# Patient Record
Sex: Female | Born: 1945 | Race: White | Hispanic: No | Marital: Married | Smoking: Former smoker
Health system: Southern US, Community
[De-identification: ages and names within clinical notes are randomized; demographics above are authoritative.]

## PROBLEM LIST (undated history)

## (undated) DIAGNOSIS — E119 Type 2 diabetes mellitus without complications: Secondary | ICD-10-CM

## (undated) DIAGNOSIS — I10 Essential (primary) hypertension: Secondary | ICD-10-CM

## (undated) DIAGNOSIS — R519 Headache, unspecified: Secondary | ICD-10-CM

## (undated) DIAGNOSIS — I2510 Atherosclerotic heart disease of native coronary artery without angina pectoris: Secondary | ICD-10-CM

## (undated) DIAGNOSIS — I219 Acute myocardial infarction, unspecified: Secondary | ICD-10-CM

## (undated) DIAGNOSIS — R51 Headache: Secondary | ICD-10-CM

## (undated) HISTORY — PX: CATARACT EXTRACTION BILATERAL W/ ANTERIOR VITRECTOMY: SHX1304

## (undated) HISTORY — PX: BACK SURGERY: SHX140

## (undated) HISTORY — PX: MELANOMA EXCISION: SHX5266

## (undated) HISTORY — PX: ABDOMINAL HYSTERECTOMY: SHX81

## (undated) HISTORY — PX: EYE SURGERY: SHX253

## (undated) HISTORY — PX: TONSILLECTOMY: SUR1361

---

## 2011-02-11 DIAGNOSIS — F329 Major depressive disorder, single episode, unspecified: Secondary | ICD-10-CM | POA: Insufficient documentation

## 2011-02-11 DIAGNOSIS — E109 Type 1 diabetes mellitus without complications: Secondary | ICD-10-CM | POA: Insufficient documentation

## 2012-05-24 DIAGNOSIS — Z9289 Personal history of other medical treatment: Secondary | ICD-10-CM | POA: Insufficient documentation

## 2012-11-16 DIAGNOSIS — I2510 Atherosclerotic heart disease of native coronary artery without angina pectoris: Secondary | ICD-10-CM | POA: Insufficient documentation

## 2013-02-13 ENCOUNTER — Emergency Department: Payer: Self-pay | Admitting: Emergency Medicine

## 2013-02-13 LAB — COMPREHENSIVE METABOLIC PANEL
Albumin: 3.9 g/dL (ref 3.4–5.0)
Alkaline Phosphatase: 121 U/L (ref 50–136)
Anion Gap: 6 — ABNORMAL LOW (ref 7–16)
BUN: 10 mg/dL (ref 7–18)
Bilirubin,Total: 0.6 mg/dL (ref 0.2–1.0)
Chloride: 101 mmol/L (ref 98–107)
Co2: 29 mmol/L (ref 21–32)
Creatinine: 0.79 mg/dL (ref 0.60–1.30)
EGFR (Non-African Amer.): >60
Glucose: 282 mg/dL — ABNORMAL HIGH (ref 65–99)
Osmolality: 281 (ref 275–301)
SGOT(AST): 19 U/L (ref 15–37)
SGPT (ALT): 25 U/L (ref 12–78)
Total Protein: 7.4 g/dL (ref 6.4–8.2)

## 2013-02-13 LAB — CBC WITH DIFFERENTIAL/PLATELET
Eosinophil #: 0.3 10*3/uL (ref 0.0–0.7)
Eosinophil %: 3 %
HCT: 38.6 % (ref 35.0–47.0)
HGB: 13.4 g/dL (ref 12.0–16.0)
MCH: 33.8 pg (ref 26.0–34.0)
MCV: 98 fL (ref 80–100)
Monocyte #: 0.3 x10 3/mm (ref 0.2–0.9)
Monocyte %: 3.3 %
Neutrophil %: 85 %
Platelet: 237 10*3/uL (ref 150–440)
RDW: 13.6 % (ref 11.5–14.5)
WBC: 9.6 10*3/uL (ref 3.6–11.0)

## 2013-02-13 LAB — LIPASE, BLOOD: Lipase: 98 U/L (ref 73–393)

## 2013-02-13 LAB — TROPONIN I: Troponin-I: <0.02 ng/mL

## 2013-03-09 ENCOUNTER — Encounter: Payer: Self-pay | Admitting: Internal Medicine

## 2013-04-06 ENCOUNTER — Encounter: Payer: Self-pay | Admitting: Internal Medicine

## 2013-11-01 DIAGNOSIS — I219 Acute myocardial infarction, unspecified: Secondary | ICD-10-CM

## 2013-11-01 HISTORY — DX: Acute myocardial infarction, unspecified: I21.9

## 2013-11-01 HISTORY — PX: CORONARY ANGIOPLASTY WITH STENT PLACEMENT: SHX49

## 2014-07-01 ENCOUNTER — Emergency Department: Payer: Self-pay | Admitting: Emergency Medicine

## 2014-07-01 LAB — COMPREHENSIVE METABOLIC PANEL
ALK PHOS: 95 U/L
ALT: 39 U/L
ANION GAP: 16 (ref 7–16)
Albumin: 4.6 g/dL
BILIRUBIN TOTAL: 1.5 mg/dL — AB
BUN: 23 mg/dL — AB
CO2: 20 mmol/L — AB
CREATININE: 1.1 mg/dL — AB
Calcium, Total: 9 mg/dL
Chloride: 97 mmol/L — ABNORMAL LOW
EGFR (Non-African Amer.): 51 — ABNORMAL LOW
GFR CALC AF AMER: 59 — AB
Glucose: 178 mg/dL — ABNORMAL HIGH
POTASSIUM: 4.3 mmol/L
SGOT(AST): 49 U/L — ABNORMAL HIGH
Sodium: 133 mmol/L — ABNORMAL LOW
Total Protein: 7.5 g/dL

## 2014-07-01 LAB — CBC WITH DIFFERENTIAL/PLATELET
Basophil #: 0 10*3/uL (ref 0.0–0.1)
Basophil %: 0.4 %
Eosinophil #: 0 10*3/uL (ref 0.0–0.7)
Eosinophil %: 0.1 %
HCT: 36.7 % (ref 35.0–47.0)
HGB: 12.1 g/dL (ref 12.0–16.0)
LYMPHS ABS: 0.6 10*3/uL — AB (ref 1.0–3.6)
Lymphocyte %: 6.3 %
MCH: 32.8 pg (ref 26.0–34.0)
MCHC: 33 g/dL (ref 32.0–36.0)
MCV: 99 fL (ref 80–100)
MONO ABS: 0.3 x10 3/mm (ref 0.2–0.9)
MONOS PCT: 3.7 %
NEUTROS ABS: 8.2 10*3/uL — AB (ref 1.4–6.5)
Neutrophil %: 89.5 %
PLATELETS: 239 10*3/uL (ref 150–440)
RBC: 3.69 10*6/uL — ABNORMAL LOW (ref 3.80–5.20)
RDW: 13.3 % (ref 11.5–14.5)
WBC: 9.2 10*3/uL (ref 3.6–11.0)

## 2014-07-01 LAB — LIPASE, BLOOD: Lipase: 15 U/L — ABNORMAL LOW

## 2014-07-01 LAB — TROPONIN I: Troponin-I: 0.03 ng/mL

## 2015-02-07 ENCOUNTER — Emergency Department: Payer: Medicare Other

## 2015-02-07 ENCOUNTER — Encounter: Payer: Self-pay | Admitting: Emergency Medicine

## 2015-02-07 ENCOUNTER — Emergency Department
Admission: EM | Admit: 2015-02-07 | Discharge: 2015-02-07 | Disposition: A | Discharge status: Home / Self Care | Payer: Medicare Other | Attending: Student | Admitting: Student

## 2015-02-07 ENCOUNTER — Ambulatory Visit
Admission: EM | Admit: 2015-02-07 | Discharge: 2015-02-07 | Discharge status: Absent without leave | Payer: Medicare Other | Source: Home / Self Care

## 2015-02-07 DIAGNOSIS — S0081XA Abrasion of other part of head, initial encounter: Secondary | ICD-10-CM | POA: Insufficient documentation

## 2015-02-07 DIAGNOSIS — E119 Type 2 diabetes mellitus without complications: Secondary | ICD-10-CM | POA: Diagnosis not present

## 2015-02-07 DIAGNOSIS — S0993XA Unspecified injury of face, initial encounter: Secondary | ICD-10-CM | POA: Diagnosis present

## 2015-02-07 DIAGNOSIS — S0083XA Contusion of other part of head, initial encounter: Secondary | ICD-10-CM | POA: Diagnosis not present

## 2015-02-07 DIAGNOSIS — S0031XA Abrasion of nose, initial encounter: Secondary | ICD-10-CM | POA: Insufficient documentation

## 2015-02-07 DIAGNOSIS — W01198A Fall on same level from slipping, tripping and stumbling with subsequent striking against other object, initial encounter: Secondary | ICD-10-CM | POA: Insufficient documentation

## 2015-02-07 DIAGNOSIS — Y9289 Other specified places as the place of occurrence of the external cause: Secondary | ICD-10-CM | POA: Diagnosis not present

## 2015-02-07 DIAGNOSIS — I10 Essential (primary) hypertension: Secondary | ICD-10-CM | POA: Diagnosis not present

## 2015-02-07 DIAGNOSIS — Y998 Other external cause status: Secondary | ICD-10-CM | POA: Diagnosis not present

## 2015-02-07 DIAGNOSIS — Y9301 Activity, walking, marching and hiking: Secondary | ICD-10-CM | POA: Insufficient documentation

## 2015-02-07 HISTORY — DX: Type 2 diabetes mellitus without complications: E11.9

## 2015-02-07 HISTORY — DX: Atherosclerotic heart disease of native coronary artery without angina pectoris: I25.10

## 2015-02-07 HISTORY — DX: Essential (primary) hypertension: I10

## 2015-02-07 MED ORDER — TRAMADOL HCL 50 MG PO TABS
50.0000 mg | ORAL_TABLET | Freq: Once | ORAL | Status: AC
  Administered 2015-02-07: 50 mg via ORAL
  Filled 2015-02-07: qty 1

## 2015-02-07 MED ORDER — TRAMADOL HCL 50 MG PO TABS: 50.0000 mg | ORAL_TABLET | Freq: Four times a day (QID) | ORAL | Status: DC | PRN

## 2015-02-07 MED ORDER — IBUPROFEN 800 MG PO TABS: 800.0000 mg | ORAL_TABLET | Freq: Three times a day (TID) | ORAL | Status: DC | PRN

## 2015-02-07 MED ORDER — IBUPROFEN 800 MG PO TABS
800.0000 mg | ORAL_TABLET | Freq: Once | ORAL | Status: AC
  Administered 2015-02-07: 800 mg via ORAL
  Filled 2015-02-07: qty 1

## 2015-02-07 NOTE — ED Notes (Signed)
Walking dog, shoe got stuck and patient tripped and fell onto concrete hitting left face. Denies LOC. Eccymosis to left eye and swelling to left forehead noted.

## 2015-02-07 NOTE — ED Provider Notes (Signed)
[facility] Emergency Department Provider Note  ____________________________________________  Time seen: Approximately 7:00 PM  I have reviewed the triage vital signs and the nursing notes.   HISTORY  Chief Complaint Fall    HPI Diana Frost is a 69 y.o. female patient tripped and fell while walking her dog. Patient said her face hit the concrete. Patient denies any loss of consciousness. Patient states she had her glasses on when she fell which causes a small abrasion or laceration to the nasal area. Patient is having mild pain edema and ecchymosis to the left supra orbital area. Patient also has some mild edema and abrasion to the nasal area. Patient rating the pain as a 0. Patient denies any vision disturbance.No palliative measures except for plan ice to the left supraorbital area.   Past Medical History  Diagnosis Date  . Coronary artery disease   . Hypertension   . Diabetes mellitus without complication ([place])     There are no active problems to display for this patient.   Past Surgical History  Procedure Laterality Date  . Cardiac stents    . Eye surgery      bilateral cataracts  . Abdominal hysterectomy      Current Outpatient Rx  Name  Route  Sig  Dispense  Refill  . ibuprofen (ADVIL,MOTRIN) 800 MG tablet   Oral   Take 1 tablet (800 mg total) by mouth every 8 (eight) hours as needed for moderate pain.   15 tablet   0   . traMADol (ULTRAM) 50 MG tablet   Oral   Take 1 tablet (50 mg total) by mouth every 6 (six) hours as needed for moderate pain.   12 tablet   0     Allergies Review of patient's allergies indicates no known allergies.  No family history on file.  Social History Social History  Substance Use Topics  . Smoking status: Never Smoker   . Smokeless tobacco: Never Used  . Alcohol Use: No    Review of Systems Constitutional: No fever/chills Eyes: No visual changes. ENT: No sore throat. Cardiovascular:  Denies chest pain. Respiratory: Denies shortness of breath. Gastrointestinal: No abdominal pain.  No nausea, no vomiting.  No diarrhea.  No constipation. Genitourinary: Negative for dysuria. Musculoskeletal: Negative for back pain. Skin: Negative for rash. Swelling and bruising to the left supraorbital area and nasal area.. Ecchymosis and abrasion to the facial area. Neurological: Negative for headaches, focal weakness or numbness. Endocrine:Diabetes and hypertension. 10-point ROS otherwise negative.  ____________________________________________   PHYSICAL EXAM:  VITAL SIGNS: ED Triage Vitals  Enc Vitals Group     BP 02/07/15 1827 131/77 mmHg     Pulse Rate 02/07/15 1824 101     Resp 02/07/15 1824 14     Temp 02/07/15 1824 98.8 F (37.1 C)     Temp Source 02/07/15 1824 Oral     SpO2 02/07/15 1827 99 %     Weight 02/07/15 1827 140 lb (63.504 kg)     Height 02/07/15 1827 5\' 2"  (1.575 m)     Head Cir --      Peak Flow --      Pain Score 02/07/15 1828 0     Pain Loc --      Pain Edu? --      Excl. in [place]? --    Constitutional: Alert and oriented. Well appearing and in no acute distress. Eyes: Conjunctivae are normal. PERRL. EOMI. ecchymosis and edema supra orbital area.  Head: Atraumatic. Nose: No congestion/rhinnorhea. Nasal edema Mouth/Throat: Mucous membranes are moist.  Oropharynx non-erythematous. Neck: No stridor.  No cervical spine tenderness to palpation. Hematological/Lymphatic/Immunilogical: No cervical lymphadenopathy. Cardiovascular: Normal rate, regular rhythm. Grossly normal heart sounds.  Good peripheral circulation. Respiratory: Normal respiratory effort.  No retractions. Lungs CTAB. Gastrointestinal: Soft and nontender. No distention. No abdominal bruits. No CVA tenderness. Musculoskeletal: No lower extremity tenderness nor edema.  No joint effusions. Neurologic:  Normal speech and language. No gross focal neurologic deficits are appreciated. No gait  instability. Skin:  Skin is warm, dry and intact. No rash noted. Ecchymosis and abrasion to the facial area. Psychiatric: Mood and affect are normal. Speech and behavior are normal.  ____________________________________________   LABS (all labs ordered are listed, but only abnormal results are displayed)  Labs Reviewed - No data to display ____________________________________________  EKG   ____________________________________________  RADIOLOGY  No acute finding outpatient x-ray. I, Sable Feil, personally viewed and evaluated these images (plain radiographs) as part of my medical decision making.   ____________________________________________   PROCEDURES  Procedure(s) performed: None  Critical Care performed: No  ____________________________________________   INITIAL IMPRESSION / ASSESSMENT AND PLAN / ED COURSE  Pertinent labs & imaging results that were available during my care of the patient were reviewed by me and considered in my medical decision making (see chart for details).  Facial contusion. Skull is x-ray findings with palpation. Patient given structures for home supportive care. Patient a prescription for tramadol and ibuprofen to take as needed. Advised to follow-up family doctor if condition persists. Return back to the ER for condition worsens. ____________________________________________   FINAL CLINICAL IMPRESSION(S) / ED DIAGNOSES  Final diagnoses:  Facial contusion, initial encounter      Sable Feil, PA-C 02/07/15 1954  Joanne Gavel, MD 02/08/15 0001

## 2015-02-07 NOTE — ED Notes (Signed)
Pt transported to and from xray via stretcher.

## 2015-02-07 NOTE — Discharge Instructions (Signed)

## 2015-02-07 NOTE — ED Notes (Signed)
Pt states she was walking her dog and got tripped up on her leash and fell on the concrete pt states she had glasses on when she hits. Denies loc, n/v.

## 2015-03-09 ENCOUNTER — Ambulatory Visit
Admission: EM | Admit: 2015-03-09 | Discharge: 2015-03-09 | Disposition: A | Discharge status: Home / Self Care | Payer: Medicare Other | Attending: Family Medicine | Admitting: Family Medicine

## 2015-03-09 DIAGNOSIS — H66002 Acute suppurative otitis media without spontaneous rupture of ear drum, left ear: Secondary | ICD-10-CM | POA: Diagnosis not present

## 2015-03-09 MED ORDER — CEFDINIR 300 MG PO CAPS: 300.0000 mg | ORAL_CAPSULE | Freq: Two times a day (BID) | ORAL | Status: DC

## 2015-03-09 NOTE — Discharge Instructions (Signed)

## 2015-03-09 NOTE — ED Provider Notes (Signed)
CSN: [id number]     Arrival date & time 03/09/15  1021 History   First MD Initiated Contact with Patient 03/09/15 1101     Chief Complaint  Patient presents with  . Otalgia   HPI    Diana Frost is a pleasant 69 y.o. female here for left ear pain that started 10 days ago. She has left lateral neck pain as well that started at the same time.  She describes the pain as dull and stabbing. Pain 6/10 at worst.  She also has rhinorrhea that is yellow.  She denies any ear discharge. She does have some maxillary sinus tenderness and pain. She states when I blow my nose it rattles in her left ear. She has tried ibuprofen 600 mg about 6 doses since pain started and it seems to help a little but pain returns once it wears off.  Pain is exacerbated by cold & movement.  Past Medical History  Diagnosis Date  . Coronary artery disease   . Hypertension   . Diabetes mellitus without complication [facility])    Past Surgical History  Procedure Laterality Date  . Coronary angioplasty with stent placement    . Cataract extraction bilateral w/ anterior vitrectomy    . Abdominal hysterectomy     History reviewed. No pertinent family history. Social History  Substance Use Topics  . Smoking status: Never Smoker   . Smokeless tobacco: Never Used  . Alcohol Use: 0.0 oz/week    0 Standard drinks or equivalent per week     Comment: occasional   OB History    No data available     Review of Systems  Constitutional: Negative.   HENT: Positive for congestion, ear pain, postnasal drip, rhinorrhea and sinus pressure. Negative for facial swelling, sneezing, sore throat and tinnitus.   Eyes: Negative.   Respiratory: Negative.   Cardiovascular: Negative.   Gastrointestinal: Negative.   Endocrine: Negative.   Genitourinary: Negative.   Skin: Negative.   Neurological: Negative.   Hematological: Negative.   Psychiatric/Behavioral: Negative.     Allergies  Review of patient's allergies indicates no known  allergies.  Home Medications   Prior to Admission medications   Medication Sig Start Date End Date Taking? Authorizing Provider  ibuprofen (ADVIL,MOTRIN) 800 MG tablet Take 1 tablet (800 mg total) by mouth every 8 (eight) hours as needed for moderate pain. 02/07/15  Yes Sable Feil, PA-C  rosuvastatin (CRESTOR) 10 MG tablet Take 10 mg by mouth daily.   Yes Historical Provider, MD  cefdinir (OMNICEF) 300 MG capsule Take 1 capsule (300 mg total) by mouth 2 (two) times daily. 03/09/15   Andria Meuse, NP  traMADol (ULTRAM) 50 MG tablet Take 1 tablet (50 mg total) by mouth every 6 (six) hours as needed for moderate pain. 02/07/15   Sable Feil, PA-C   Meds Ordered and Administered this Visit  Medications - No data to display  BP 121/73 mmHg  Pulse 88  Temp(Src) 97.3 F (36.3 C) (Tympanic)  Resp 16  Ht 5\' 2"  (1.575 m)  Wt 140 lb (63.504 kg)  BMI 25.60 kg/m2  SpO2 98% No data found.   Physical Exam  Constitutional: She is oriented to person, place, and time. She appears well-developed and well-nourished. No distress.  HENT:  Head: Normocephalic and atraumatic.  Right Ear: Hearing, tympanic membrane, external ear and ear canal normal.  Left Ear: Hearing and external ear normal. Tympanic membrane is injected and erythematous. Tympanic membrane is not  retracted and not bulging. A middle ear effusion is present.  Nose: Right sinus exhibits no maxillary sinus tenderness and no frontal sinus tenderness. Left sinus exhibits no maxillary sinus tenderness and no frontal sinus tenderness.  Mouth/Throat: Uvula is midline, oropharynx is clear and moist and mucous membranes are normal.  Eyes: Conjunctivae are normal. No scleral icterus.  Neck: Normal range of motion.  Cardiovascular: Normal rate and regular rhythm.   Pulmonary/Chest: Effort normal and breath sounds normal. No respiratory distress.  Abdominal: Soft. Bowel sounds are normal. She exhibits no distension.  Musculoskeletal: Normal  range of motion. She exhibits no edema or tenderness.  Neurological: She is alert and oriented to person, place, and time. No cranial nerve deficit.  Skin: Skin is warm and dry. No rash noted. No erythema.  Psychiatric: Her behavior is normal. Judgment normal.  Nursing note and vitals reviewed.   ED Course  Procedures n/a  MDM   1. Acute suppurative otitis media of left ear without spontaneous rupture of tympanic membrane, recurrence not specified    Plan: Diagnosis reviewed with patient  Rx as per orders;  benefits, risks, potential side effects reviewed  Recommend supportive treatment with rest, increased fluids, tylenol or Ibuprofen PRN as directed Seek additional medical care if symptoms worsen or are not improving  Andria Meuse, NP 03/09/15 1121

## 2015-03-09 NOTE — ED Notes (Signed)
Patient complains of left ear pain. She states that symptoms started 1.5 weeks ago. She states that she also has pain that radiates down the side of her neck. She states that pain is dull and stabbing at times. She also reports that she is having a runny nose.

## 2015-08-12 ENCOUNTER — Other Ambulatory Visit: Payer: Self-pay | Admitting: Otolaryngology

## 2015-08-12 DIAGNOSIS — H6532 Chronic mucoid otitis media, left ear: Secondary | ICD-10-CM

## 2015-08-12 DIAGNOSIS — H9012 Conductive hearing loss, unilateral, left ear, with unrestricted hearing on the contralateral side: Secondary | ICD-10-CM

## 2015-08-15 ENCOUNTER — Ambulatory Visit
Admission: RE | Admit: 2015-08-15 | Discharge: 2015-08-15 | Disposition: A | Discharge status: Home / Self Care | Payer: Medicare Other | Source: Ambulatory Visit | Attending: Otolaryngology | Admitting: Otolaryngology

## 2015-08-15 DIAGNOSIS — H6532 Chronic mucoid otitis media, left ear: Secondary | ICD-10-CM | POA: Insufficient documentation

## 2015-08-15 DIAGNOSIS — H9012 Conductive hearing loss, unilateral, left ear, with unrestricted hearing on the contralateral side: Secondary | ICD-10-CM | POA: Diagnosis not present

## 2015-08-19 ENCOUNTER — Encounter: Payer: Self-pay | Admitting: *Deleted

## 2015-08-20 NOTE — Discharge Instructions (Signed)
[facility] DISCHARGE INSTRUCTIONS FOR MYRINGOTOMY AND TUBE INSERTION  [facility] Margaretha Sheffield, M.D. Roena Malady, M.D. Malon Kindle, M.D. Carloyn Manner, M.D.  Diet:   After surgery, the patient should take only liquids and foods as tolerated.  The patient may then have a regular diet after the effects of anesthesia have worn off, usually about four to six hours after surgery.  Activities:   The patient should rest until the effects of anesthesia have worn off.  After this, there are no restrictions on the normal daily activities.  Medications:   You will be given antibiotic drops to be used in the ears postoperatively.  It is recommended to use 4 drops 2 times a day for 4 days, then the drops should be saved for possible future use.  The tubes should not cause any discomfort to the patient, but if there is any question, Tylenol should be given according to the instructions for the age of the patient.  Other medications should be continued normally.  Precautions:   Should there be recurrent drainage after the tubes are placed, the drops should be used for approximately 3-4 days.  If it does not clear, you should call the ENT office.  Earplugs:   Earplugs are only needed for those who are going to be submerged under water.  When taking a bath or shower and using a cup or showerhead to rinse hair, it is not necessary to wear earplugs.  These come in a variety of fashions, all of which can be obtained at our office.  However, if one is not able to come by the office, then silicone plugs can be found at most pharmacies.  It is not advised to stick anything in the ear that is not approved as an earplug.  Silly putty is not to be used as an earplug.  Swimming is allowed in patients after ear tubes are inserted, however, they must wear earplugs if they are going to be submerged under water.  For those children who are going to be swimming a lot, it is  recommended to use a fitted ear mold, which can be made by our audiologist.  If discharge is noticed from the ears, this most likely represents an ear infection.  We would recommend getting your eardrops and using them as indicated above.  If it does not clear, then you should call the ENT office.  For follow up, the patient should return to the ENT office three weeks postoperatively and then every six months as required by the doctor.   General Anesthesia, Adult, Care After Refer to this sheet in the next few weeks. These instructions provide you with information on caring for yourself after your procedure. Your health care provider may also give you more specific instructions. Your treatment has been planned according to current medical practices, but problems sometimes occur. Call your health care provider if you have any problems or questions after your procedure. WHAT TO EXPECT AFTER THE PROCEDURE After the procedure, it is typical to experience:  Sleepiness.  Nausea and vomiting. HOME CARE INSTRUCTIONS  For the first 24 hours after general anesthesia:  Have a responsible person with you.  Do not drive a car. If you are alone, do not take public transportation.  Do not drink alcohol.  Do not take medicine that has not been prescribed by your health care provider.  Do not sign important papers or make important decisions.  You may resume a  normal diet and activities as directed by your health care provider.  Change bandages (dressings) as directed.  If you have questions or problems that seem related to general anesthesia, call the hospital and ask for the anesthetist or anesthesiologist on call. SEEK MEDICAL CARE IF:  You have nausea and vomiting that continue the day after anesthesia.  You develop a rash. SEEK IMMEDIATE MEDICAL CARE IF:   You have difficulty breathing.  You have chest pain.  You have any allergic problems.   This information is not intended to replace  advice given to you by your health care provider. Make sure you discuss any questions you have with your health care provider.   Document Released: 06/29/2000 Document Revised: 04/13/2014 Document Reviewed: 07/22/2011 Elsevier Interactive Patient Education [employer].

## 2015-08-21 ENCOUNTER — Ambulatory Visit: Payer: Medicare Other | Admitting: Anesthesiology

## 2015-08-21 ENCOUNTER — Ambulatory Visit
Admission: RE | Admit: 2015-08-21 | Discharge: 2015-08-21 | Disposition: A | Discharge status: Home / Self Care | Payer: Medicare Other | Source: Ambulatory Visit | Attending: Otolaryngology | Admitting: Otolaryngology

## 2015-08-21 ENCOUNTER — Encounter
Admission: RE | Disposition: A | Discharge status: Home / Self Care | Payer: Self-pay | Source: Ambulatory Visit | Attending: Otolaryngology

## 2015-08-21 DIAGNOSIS — Z882 Allergy status to sulfonamides status: Secondary | ICD-10-CM | POA: Diagnosis not present

## 2015-08-21 DIAGNOSIS — Z79899 Other long term (current) drug therapy: Secondary | ICD-10-CM | POA: Insufficient documentation

## 2015-08-21 DIAGNOSIS — E119 Type 2 diabetes mellitus without complications: Secondary | ICD-10-CM | POA: Diagnosis not present

## 2015-08-21 DIAGNOSIS — I252 Old myocardial infarction: Secondary | ICD-10-CM | POA: Diagnosis not present

## 2015-08-21 DIAGNOSIS — Z7982 Long term (current) use of aspirin: Secondary | ICD-10-CM | POA: Diagnosis not present

## 2015-08-21 DIAGNOSIS — Z885 Allergy status to narcotic agent status: Secondary | ICD-10-CM | POA: Insufficient documentation

## 2015-08-21 DIAGNOSIS — Z888 Allergy status to other drugs, medicaments and biological substances status: Secondary | ICD-10-CM | POA: Insufficient documentation

## 2015-08-21 DIAGNOSIS — I2510 Atherosclerotic heart disease of native coronary artery without angina pectoris: Secondary | ICD-10-CM | POA: Diagnosis not present

## 2015-08-21 DIAGNOSIS — I10 Essential (primary) hypertension: Secondary | ICD-10-CM | POA: Insufficient documentation

## 2015-08-21 DIAGNOSIS — H6692 Otitis media, unspecified, left ear: Secondary | ICD-10-CM | POA: Insufficient documentation

## 2015-08-21 HISTORY — DX: Headache, unspecified: R51.9

## 2015-08-21 HISTORY — DX: Acute myocardial infarction, unspecified: I21.9

## 2015-08-21 HISTORY — DX: Headache: R51

## 2015-08-21 HISTORY — PX: MYRINGOTOMY WITH TUBE PLACEMENT: SHX5663

## 2015-08-21 LAB — GLUCOSE, CAPILLARY
GLUCOSE-CAPILLARY: 213 mg/dL — AB (ref 65–99)
Glucose-Capillary: 228 mg/dL — ABNORMAL HIGH (ref 65–99)

## 2015-08-21 SURGERY — MYRINGOTOMY WITH TUBE PLACEMENT
Anesthesia: General | Site: Ear | Laterality: Left | Wound class: Clean Contaminated

## 2015-08-21 MED ORDER — ACETAMINOPHEN 325 MG PO TABS
325.0000 mg | ORAL_TABLET | ORAL | Status: DC | PRN
  Administered 2015-08-21: 650 mg via ORAL

## 2015-08-21 MED ORDER — LACTATED RINGERS IV SOLN
INTRAVENOUS | Status: DC | PRN
  Administered 2015-08-21: 07:00:00 via INTRAVENOUS

## 2015-08-21 MED ORDER — PROPOFOL 10 MG/ML IV BOLUS
INTRAVENOUS | Status: DC | PRN
  Administered 2015-08-21: 50 mg via INTRAVENOUS

## 2015-08-21 MED ORDER — ACETAMINOPHEN 160 MG/5ML PO SOLN: 325.0000 mg | ORAL | Status: DC | PRN

## 2015-08-21 MED ORDER — LIDOCAINE HCL (CARDIAC) 20 MG/ML IV SOLN
INTRAVENOUS | Status: DC | PRN
  Administered 2015-08-21: 30 mg via INTRAVENOUS

## 2015-08-21 MED ORDER — ONDANSETRON HCL 4 MG/2ML IJ SOLN: 4.0000 mg | Freq: Once | INTRAMUSCULAR | Status: DC | PRN

## 2015-08-21 SURGICAL SUPPLY — 11 items
BLADE MYR LANCE NRW W/HDL (BLADE) ×2 IMPLANT
CANISTER SUCT 1200ML W/VALVE (MISCELLANEOUS) ×2 IMPLANT
COTTONBALL LRG STERILE PKG (GAUZE/BANDAGES/DRESSINGS) ×2 IMPLANT
GLOVE BIO SURGEON STRL SZ7.5 (GLOVE) ×4 IMPLANT
STRAP BODY AND KNEE 60X3 (MISCELLANEOUS) ×2 IMPLANT
TOWEL OR 17X26 4PK STRL BLUE (TOWEL DISPOSABLE) ×2 IMPLANT
TUBE EAR ARMSTRONG HC 1.14X3.5 (OTOLOGIC RELATED) ×2 IMPLANT
TUBE EAR T 1.27X4.5 GO LF (OTOLOGIC RELATED) IMPLANT
TUBE EAR T 1.27X5.3 BFLY (OTOLOGIC RELATED) IMPLANT
TUBING CONN 6MMX3.1M (TUBING) ×1
TUBING SUCTION CONN 0.25 STRL (TUBING) ×1 IMPLANT

## 2015-08-21 NOTE — Transfer of Care (Signed)
Immediate Anesthesia Transfer of Care Note  Patient: Diana Frost  Procedure(s) Performed: Procedure(s): MYRINGOTOMY WITH TUBE PLACEMENT (Left)  Patient Location: PACU  Anesthesia Type: General  Level of Consciousness: awake, alert  and patient cooperative  Airway and Oxygen Therapy: Patient Spontanous Breathing and Patient connected to supplemental oxygen  Post-op Assessment: Post-op Vital signs reviewed, Patient's Cardiovascular Status Stable, Respiratory Function Stable, Patent Airway and No signs of Nausea or vomiting  Post-op Vital Signs: Reviewed and stable  Complications: No apparent anesthesia complications

## 2015-08-21 NOTE — H&P (Signed)
..  History and Physical paper copy reviewed and updated date of procedure and will be scanned into system.  

## 2015-08-21 NOTE — Anesthesia Postprocedure Evaluation (Signed)
Anesthesia Post Note  Patient: Diana Frost  Procedure(s) Performed: Procedure(s) (LRB): MYRINGOTOMY WITH TUBE PLACEMENT (Left)  Patient location during evaluation: PACU Anesthesia Type: General Level of consciousness: awake and alert Pain management: pain level controlled Vital Signs Assessment: post-procedure vital signs reviewed and stable Respiratory status: spontaneous breathing, nonlabored ventilation, respiratory function stable and patient connected to nasal cannula oxygen Cardiovascular status: blood pressure returned to baseline and stable Postop Assessment: no signs of nausea or vomiting Anesthetic complications: no    Amaryllis Dyke

## 2015-08-21 NOTE — Anesthesia Preprocedure Evaluation (Signed)
Anesthesia Evaluation  Patient identified by MRN, date of birth, ID band Patient awake    Reviewed: Allergy & Precautions, H&P , NPO status , Patient's Chart, lab work & pertinent test results, reviewed documented beta blocker date and time   Airway Mallampati: II  TM Distance: >3 FB Neck ROM: full    Dental   Pulmonary    Pulmonary exam normal        Cardiovascular hypertension, + CAD and + Past MI  Normal cardiovascular exam     Neuro/Psych  Headaches,    GI/Hepatic   Endo/Other  diabetes  Renal/GU      Musculoskeletal   Abdominal   Peds  Hematology   Anesthesia Other Findings   Reproductive/Obstetrics                             Anesthesia Physical Anesthesia Plan  ASA: II  Anesthesia Plan: General   Post-op Pain Management:    Induction:   Airway Management Planned:   Additional Equipment:   Intra-op Plan:   Post-operative Plan:   Informed Consent: I have reviewed the patients History and Physical, chart, labs and discussed the procedure including the risks, benefits and alternatives for the proposed anesthesia with the patient or authorized representative who has indicated his/her understanding and acceptance.     Plan Discussed with: CRNA  Anesthesia Plan Comments:         Anesthesia Quick Evaluation

## 2015-08-21 NOTE — Op Note (Signed)
..  08/21/2015  8:12 AM    Diana Frost  [id number]   Pre-Op Dx:  COM  Post-op Dx: COM  Proc:Left myringotomy with tubes  Surg: [facility]  Anes:  General by mask  EBL:  None  Comp:  None  Findings:  Left opaque otitis media with bulging drum.  Very stenotic and tortuous canal.  Would be a very difficult awake tympanostomy tube placement.  Procedure: With the patient in a comfortable supine position, general mask anesthesia was administered.  At an appropriate level, microscope and speculum were used to examine and clean the LEFT ear canal.  The findings were as described above.  An anterior inferior radial myringotomy incision was sharply executed.  Middle ear contents were suctioned clear with a size 5 otologic suction.  A PE tube was placed without difficulty using a Rosen pick and Animal nutritionist.  Ciprodex otic solution was instilled into the external canal, and insufflated into the middle ear.  A cotton ball was placed at the external meatus. Hemostasis was observed.  This side was completed.  Following this  The patient was returned to anesthesia, awakened, and transferred to recovery in stable condition.  Dispo:  PACU to home  Plan: Routine drop use and water precautions.  Recheck my office three weeks.   Tripp Goins 8:12 AM 08/21/2015

## 2015-08-21 NOTE — Anesthesia Procedure Notes (Addendum)
Procedure Name: MAC Performed by: Caroleen Stoermer Pre-anesthesia Checklist: Patient identified, Patient being monitored, Emergency Drugs available, Timeout performed and Suction available Patient Re-evaluated:Patient Re-evaluated prior to inductionOxygen Delivery Method: Circle system utilized Preoxygenation: Pre-oxygenation with 100% oxygen Intubation Type: Combination inhalational/ intravenous induction Ventilation: Mask ventilation without difficulty Dental Injury: Teeth and Oropharynx as per pre-operative assessment        

## 2015-08-22 ENCOUNTER — Encounter: Payer: Self-pay | Admitting: Otolaryngology

## 2015-10-10 DIAGNOSIS — I208 Other forms of angina pectoris: Secondary | ICD-10-CM | POA: Insufficient documentation

## 2015-10-17 ENCOUNTER — Other Ambulatory Visit: Payer: Self-pay | Admitting: Otolaryngology

## 2015-10-17 DIAGNOSIS — H9202 Otalgia, left ear: Secondary | ICD-10-CM

## 2015-10-17 DIAGNOSIS — G501 Atypical facial pain: Secondary | ICD-10-CM

## 2015-10-27 ENCOUNTER — Emergency Department
Admission: EM | Admit: 2015-10-27 | Discharge: 2015-10-27 | Disposition: A | Discharge status: Home / Self Care | Payer: Medicare Other | Attending: Emergency Medicine | Admitting: Emergency Medicine

## 2015-10-27 ENCOUNTER — Other Ambulatory Visit: Payer: Self-pay | Admitting: Emergency Medicine

## 2015-10-27 DIAGNOSIS — K122 Cellulitis and abscess of mouth: Secondary | ICD-10-CM | POA: Diagnosis present

## 2015-10-27 DIAGNOSIS — I10 Essential (primary) hypertension: Secondary | ICD-10-CM | POA: Diagnosis not present

## 2015-10-27 DIAGNOSIS — E119 Type 2 diabetes mellitus without complications: Secondary | ICD-10-CM | POA: Diagnosis not present

## 2015-10-27 DIAGNOSIS — I2510 Atherosclerotic heart disease of native coronary artery without angina pectoris: Secondary | ICD-10-CM | POA: Diagnosis not present

## 2015-10-27 DIAGNOSIS — I252 Old myocardial infarction: Secondary | ICD-10-CM | POA: Insufficient documentation

## 2015-10-27 DIAGNOSIS — Z794 Long term (current) use of insulin: Secondary | ICD-10-CM | POA: Diagnosis not present

## 2015-10-27 LAB — GLUCOSE, CAPILLARY
GLUCOSE-CAPILLARY: 235 mg/dL — AB (ref 65–99)
Glucose-Capillary: 228 mg/dL — ABNORMAL HIGH (ref 65–99)

## 2015-10-27 MED ORDER — CEPHALEXIN 500 MG PO CAPS
500.0000 mg | ORAL_CAPSULE | Freq: Once | ORAL | Status: AC
  Administered 2015-10-27: 500 mg via ORAL
  Filled 2015-10-27: qty 1

## 2015-10-27 MED ORDER — CEPHALEXIN 500 MG PO CAPS
500.0000 mg | ORAL_CAPSULE | Freq: Three times a day (TID) | ORAL | 0 refills | Status: AC
Start: 2015-10-27 — End: 2015-11-06

## 2015-10-27 MED ORDER — CEPHALEXIN 500 MG PO CAPS: 500.0000 mg | ORAL_CAPSULE | Freq: Three times a day (TID) | ORAL | 0 refills | Status: DC

## 2015-10-27 MED ORDER — DEXAMETHASONE SODIUM PHOSPHATE 10 MG/ML IJ SOLN
10.0000 mg | Freq: Once | INTRAMUSCULAR | Status: AC
  Administered 2015-10-27: 10 mg via INTRAVENOUS
  Filled 2015-10-27: qty 1

## 2015-10-27 MED ORDER — GI COCKTAIL ~~LOC~~
30.0000 mL | Freq: Once | ORAL | Status: AC
  Administered 2015-10-27: 30 mL via ORAL
  Filled 2015-10-27: qty 30

## 2015-10-27 NOTE — Discharge Instructions (Signed)
Please follow-up with your primary care doctor in the next 1-2 days for recheck/reevaluation. Return to the emergency department for any trouble swallowing or breathing.

## 2015-10-27 NOTE — ED Provider Notes (Signed)
[facility] Emergency Department Provider Note  Time seen: 6:22 AM  I have reviewed the triage vital signs and the nursing notes.   HISTORY  Chief Complaint No chief complaint on file.    HPI Diana Frost is a 70 y.o. female with a past medical history of diabetes, hypertension, MI who presents the emergency department with throat pain and difficulty swallowing. According to the patient she went to bed feeling well. She awoke feeling like she was choking with a sore throat and vomited. States she has had a very sore throat since, with pain and difficulty swallowing. Denies any trouble breathing. Patient does states she snores at night. Denies any fever. States she has had sinus drainage from her right maxillary sinus for the past several days.Describes a sore throat as severe.     Past Medical History:  Diagnosis Date  . Coronary artery disease   . Diabetes mellitus without complication (HCC)    insulin pump  . Headache    since ear problem started  . Hypertension   . Myocardial infarction ([place]) 11/01/13    There are no active problems to display for this patient.   Past Surgical History:  Procedure Laterality Date  . ABDOMINAL HYSTERECTOMY    . BACK SURGERY     tail bone removed after fall/fracture  . CATARACT EXTRACTION BILATERAL W/ ANTERIOR VITRECTOMY    . CORONARY ANGIOPLASTY WITH STENT PLACEMENT  11/01/13   [facility], stent x2  . MELANOMA EXCISION Right    arm with skin graft  . MYRINGOTOMY WITH TUBE PLACEMENT Left 08/21/2015   Procedure: MYRINGOTOMY WITH TUBE PLACEMENT;  Surgeon: Carloyn Manner, MD;  Location: [place];  Service: ENT;  Laterality: Left;    Current Outpatient Rx  . Order #: [id number] Class: Historical Med  . Order #: [id number] Class: Historical Med  . Order #: [id number] Class: Historical Med  . Order #: [id number] Class: Print  . Order #: [id number] Class: Historical Med  . Order #: [id number] Class:  Historical Med  . Order #: [id number] Class: Historical Med  . Order #: [id number] Class: Historical Med  . Order #: [id number] Class: Historical Med  . Order #: [id number] Class: Historical Med  . Order #: [id number] Class: Historical Med  . Order #: [id number] Class: Historical Med    Allergies Influenza vaccines; Percocet [oxycodone-acetaminophen]; Statins; and Sulfa antibiotics  No family history on file.  Social History Social History  Substance Use Topics  . Smoking status: Never Smoker  . Smokeless tobacco: Never Used  . Alcohol use 10.2 oz/week    17 Glasses of wine per week     Comment:      Review of Systems Constitutional: Negative for fever.Positive for sore throat. Cardiovascular: Negative for chest pain. Respiratory: Negative for shortness of breath. Gastrointestinal: Negative for abdominal pain Genitourinary: Negative for dysuria. Denies urinary frequency Neurological: Negative for headache 10-point ROS otherwise negative.  ____________________________________________   PHYSICAL EXAM:  Constitutional: Alert and oriented. Well appearing and in no distress. Eyes: Normal exam ENT   Head: Normocephalic and atraumatic.   Mouth/Throat: Mucous membranes are moist. Patient has moderate pharyngeal erythema with a moderately swollen uvula most consistent with uvulitis. No exudate noted. Patient is able to swallow secretions. Cardiovascular: Normal rate, regular rhythm. No murmur Respiratory: Normal respiratory effort without tachypnea nor retractions. Breath sounds are clear  Gastrointestinal: Soft and nontender. No distention.  Musculoskeletal: Nontender with normal range of motion in all extremities. Neurologic:  Normal speech and language. No gross focal neurologic deficits Skin:  Skin  is warm, dry and intact.  Psychiatric: Mood and affect are normal.   ____________________________________________   INITIAL IMPRESSION / ASSESSMENT AND PLAN / ED  COURSE  Pertinent labs & imaging results that were available during my care of the patient were reviewed by me and considered in my medical decision making (see chart for details).  Patient presents the emergency department with acute onset of sore throat with an episode of nausea and vomiting this morning. Patient's exam is very consistent with uvulitis. Patient does snore but given her recent sinus congestion I suspect likely a streptococcal infection causing her uvulitis. Patient's blood sugar was elevated upon arrival in the 500s so blood work was performed showing a mild leukocytosis of 13,000. Blood glucose on chemistry is 508. After 1 L of normal saline current fingerstick is 235. Patient has an insulin pump. Given the patient's exam consistent with uvulitis we will dose Decadron and discharged on Keflex. I discussed with the patient the Decadron will help immensely with her discomfort, but she will need to monitor her blood glucose for a closely over the next 2-3 days and adjust her insulin as necessary. The patient is agreeable, will follow up with her primary care doctor. I discussed return precautions for difficulty swallowing/breathing.  ____________________________________________   FINAL CLINICAL IMPRESSION(S) / ED DIAGNOSES  Uvulitis    Harvest Dark, MD 10/27/15 [phone]

## 2015-10-29 LAB — GLUCOSE, CAPILLARY: GLUCOSE-CAPILLARY: 525 mg/dL — AB (ref 65–99)

## 2015-10-29 LAB — POCT RAPID STREP A: STREPTOCOCCUS, GROUP A SCREEN (DIRECT): NEGATIVE

## 2015-10-30 ENCOUNTER — Ambulatory Visit
Admission: RE | Admit: 2015-10-30 | Discharge: 2015-10-30 | Disposition: A | Discharge status: Home / Self Care | Payer: Medicare Other | Source: Ambulatory Visit | Attending: Otolaryngology | Admitting: Otolaryngology

## 2015-10-30 ENCOUNTER — Encounter: Payer: Medicare Other | Attending: Cardiovascular Disease | Admitting: *Deleted

## 2015-10-30 VITALS — Ht 61.4 in | Wt 127.7 lb

## 2015-10-30 DIAGNOSIS — G501 Atypical facial pain: Secondary | ICD-10-CM | POA: Diagnosis present

## 2015-10-30 DIAGNOSIS — H9202 Otalgia, left ear: Secondary | ICD-10-CM | POA: Diagnosis not present

## 2015-10-30 DIAGNOSIS — I208 Other forms of angina pectoris: Secondary | ICD-10-CM | POA: Insufficient documentation

## 2015-10-30 DIAGNOSIS — R930 Abnormal findings on diagnostic imaging of skull and head, not elsewhere classified: Secondary | ICD-10-CM | POA: Diagnosis not present

## 2015-10-30 DIAGNOSIS — I739 Peripheral vascular disease, unspecified: Secondary | ICD-10-CM | POA: Diagnosis not present

## 2015-10-30 DIAGNOSIS — H68102 Unspecified obstruction of Eustachian tube, left ear: Secondary | ICD-10-CM | POA: Insufficient documentation

## 2015-10-30 LAB — POCT I-STAT CREATININE: CREATININE: 0.9 mg/dL (ref 0.44–1.00)

## 2015-10-30 MED ORDER — GADOBENATE DIMEGLUMINE 529 MG/ML IV SOLN
10.0000 mL | Freq: Once | INTRAVENOUS | Status: AC | PRN
  Administered 2015-10-30: 10 mL via INTRAVENOUS

## 2015-10-30 NOTE — Progress Notes (Signed)
Daily Session Note  Patient Details  Name: AMBRIE CARTE MRN: [id number] Date of Birth: [birth year] Referring Provider:    Encounter Date: 10/30/2015  Check In:     Session Check In - 10/30/15 1302      Check-In   Resistance Training Performed No   VAD Patient? No     Pain Assessment   Currently in Pain? No/denies         Goals Met:  Personal goals reviewed  Goals Unmet:  Not Applicable  Comments:    Dr. Emily Filbert is Medical Director for [facility].

## 2015-10-31 ENCOUNTER — Encounter: Payer: Self-pay | Admitting: *Deleted

## 2015-10-31 DIAGNOSIS — I208 Other forms of angina pectoris: Secondary | ICD-10-CM

## 2015-10-31 NOTE — Progress Notes (Signed)
Cardiac Individual Treatment Plan  Patient Details  Name: MAIMUNA SCHLEGELMILCH MRN: [id number] Date of Birth: 05-Mar-1946 Referring Provider:   Flowsheet Row Cardiac Rehab from 10/30/2015 in [facility] Cardiac and Pulmonary Rehab  Referring Provider  Sheppard Coil      Initial Encounter Date:  Flowsheet Row Cardiac Rehab from 10/30/2015 in [facility] Cardiac and Pulmonary Rehab  Date  10/30/15  Referring Provider  Sheppard Coil      Visit Diagnosis: Stable angina [facility])  Patient's Home Medications on Admission:  Current Outpatient Prescriptions:  .  aspirin 81 MG tablet, Take 81 mg by mouth daily., Disp: , Rfl:  .  cephALEXin (KEFLEX) 500 MG capsule, Take 1 capsule (500 mg total) by mouth 3 (three) times daily., Disp: 30 capsule, Rfl: 0 .  cephALEXin (KEFLEX) 500 MG capsule, Take 1 capsule (500 mg total) by mouth 3 (three) times daily., Disp: 30 capsule, Rfl: 0 .  FLUoxetine (PROZAC) 40 MG capsule, Take 40 mg by mouth daily., Disp: , Rfl:  .  Glucagon, rDNA, (GLUCAGON EMERGENCY IJ), Inject as directed as needed (for low blood sugar)., Disp: , Rfl:  .  ibuprofen (ADVIL,MOTRIN) 800 MG tablet, Take 1 tablet (800 mg total) by mouth every 8 (eight) hours as needed for moderate pain., Disp: 15 tablet, Rfl: 0 .  insulin glargine (LANTUS) 100 UNIT/ML injection, Inject into the skin. As needed if insulin pump is not working, Disp: , Rfl:  .  insulin lispro (HUMALOG) 100 UNIT/ML injection, Inject into the skin. In insulin pump, Disp: , Rfl:  .  losartan (COZAAR) 25 MG tablet, Take 25 mg by mouth daily., Disp: , Rfl:  .  Melatonin 5 MG TABS, Take 10 mg by mouth at bedtime as needed., Disp: , Rfl:  .  metoprolol tartrate (LOPRESSOR) 25 MG tablet, Take 12.5 mg by mouth 2 (two) times daily. Pt only takes 1x/day, Disp: , Rfl:  .  Multiple Vitamin (MULTIVITAMIN) capsule, Take 1 capsule by mouth daily., Disp: , Rfl:  .  ondansetron (ZOFRAN-ODT) 4 MG disintegrating tablet, Take 4 mg by mouth every 8 (eight) hours as needed  for nausea or vomiting., Disp: , Rfl:  .  rosuvastatin (CRESTOR) 10 MG tablet, Take 10 mg by mouth daily., Disp: , Rfl:   Past Medical History: Past Medical History:  Diagnosis Date  . Coronary artery disease   . Diabetes mellitus without complication (HCC)    insulin pump  . Headache    since ear problem started  . Hypertension   . Myocardial infarction ([place]) 11/01/13    Tobacco Use: History  Smoking Status  . Never Smoker  Smokeless Tobacco  . Never Used    Labs: Recent Review Flowsheet Data    There is no flowsheet data to display.       Exercise Target Goals: Date: 10/30/15  Exercise Program Goal: Individual exercise prescription set with THRR, safety & activity barriers. Participant demonstrates ability to understand and report RPE using BORG scale, to self-measure pulse accurately, and to acknowledge the importance of the exercise prescription.  Exercise Prescription Goal: Starting with aerobic activity 30 plus minutes a day, 3 days per week for initial exercise prescription. Provide home exercise prescription and guidelines that participant acknowledges understanding prior to discharge.  Activity Barriers & Risk Stratification:     Activity Barriers & Cardiac Risk Stratification - 10/30/15 1259      Activity Barriers & Cardiac Risk Stratification   Cardiac Risk Stratification High      6 Minute Walk:  [place] Name 10/30/15 1313         6 Minute Walk   Distance 1250 feet     Walk Time 6 minutes     MPH 2.36     METS 2.77     RPE 12     Perceived Dyspnea  2     VO2 Peak 9.7     Symptoms No     Resting HR 98 bpm     Resting BP 92/60     Max Ex. HR 112 bpm     Max Ex. BP 104/58        Initial Exercise Prescription:     Initial Exercise Prescription - 10/30/15 1300      Date of Initial Exercise RX and Referring Provider   Date 10/30/15   Referring Provider Sheppard Coil     Treadmill   MPH 2   Grade 0.5   Minutes 15    METs 2.67     Recumbant Bike   Level 1   RPM 60   Watts 10   Minutes 15   METs 2.52     NuStep   Level 1   Watts 10   Minutes 15   METs 2.52     REL-XR   Level 1   Watts 30   Minutes 15   METs 2.5     Biostep-RELP   Level 1   Watts 15   Minutes 15   METs 2.5     Prescription Details   Frequency (times per week) 3   Duration Progress to 45 minutes of aerobic exercise without signs/symptoms of physical distress     Intensity   THRR 40-80% of Max Heartrate 118-139   Ratings of Perceived Exertion 11-15   Perceived Dyspnea 0-4     Progression   Progression Continue to progress workloads to maintain intensity without signs/symptoms of physical distress.     Resistance Training   Training Prescription Yes   Weight 2   Reps 10-15      Perform Capillary Blood Glucose checks as needed.  Exercise Prescription Changes:   Exercise Comments:   Discharge Exercise Prescription (Final Exercise Prescription Changes):   Nutrition:  Target Goals: Understanding of nutrition guidelines, daily intake of sodium 1500mg , cholesterol 200mg , calories 30% from fat and 7% or less from saturated fats, daily to have 5 or more servings of fruits and vegetables.  Biometrics:     Pre Biometrics - 10/30/15 1310      Pre Biometrics   Height 5' 1.4" (1.56 m)   Weight 127 lb 11.2 oz (57.9 kg)   Waist Circumference 32 inches   Hip Circumference 37 inches   Waist to Hip Ratio 0.86 %   BMI (Calculated) 23.9       Nutrition Therapy Plan and Nutrition Goals:     Nutrition Therapy & Goals - 10/30/15 1304      Nutrition Therapy   Drug/Food Interactions Statins/Certain Fruits     Personal Nutrition Goals   Personal Goal #1 --  Yarrow is a diabetic on insulin. Her fasting blood sugar reported from home today was 126.      Intervention Plan   Intervention Prescribe, educate and counsel regarding individualized specific dietary modifications aiming towards targeted core  components such as weight, hypertension, lipid management, diabetes, heart failure and other comorbidities.   Expected Outcomes Short Term Goal: Understand basic principles of dietary content, such as calories, fat, sodium, cholesterol and  nutrients.;Short Term Goal: A plan has been developed with personal nutrition goals set during dietitian appointment.;Long Term Goal: Adherence to prescribed nutrition plan.      Nutrition Discharge: Rate Your Plate Scores:   Nutrition Goals Re-Evaluation:   Psychosocial: Target Goals: Acknowledge presence or absence of depression, maximize coping skills, provide positive support system. Participant is able to verbalize types and ability to use techniques and skills needed for reducing stress and depression.  Initial Review & Psychosocial Screening:     Initial Psych Review & Screening - 10/30/15 1305      Initial Review   Current issues with Current Stress Concerns   Source of Stress Concerns Chronic Illness   Comments Her husband has a chronic illness condition.      Family Dynamics   Good Support System? Yes     Barriers   Psychosocial barriers to participate in program The patient should benefit from training in stress management and relaxation.     Screening Interventions   Interventions Encouraged to exercise;Program counselor consult      Quality of Life Scores:     Quality of Life - 10/31/15 1148      Quality of Life Scores   Health/Function Pre 24 %   Socioeconomic Pre 27 %   Psych/Spiritual Pre 24.86 %   Family Pre 30 %   GLOBAL Pre 25.74 %      PHQ-9: Recent Review Flowsheet Data    Depression screen [facility] 2/9 10/30/2015   Decreased Interest 1   Down, Depressed, Hopeless 1   PHQ - 2 Score 2   Altered sleeping 1   Tired, decreased energy 2   Change in appetite 1   Feeling bad or failure about yourself  1   Trouble concentrating 0   Moving slowly or fidgety/restless 0   Suicidal thoughts 0   PHQ-9 Score 7       Psychosocial Evaluation and Intervention:   Psychosocial Re-Evaluation:   Vocational Rehabilitation: Provide vocational rehab assistance to qualifying candidates.   Vocational Rehab Evaluation & Intervention:     Vocational Rehab - 10/30/15 1259      Initial Vocational Rehab Evaluation & Intervention   Assessment shows need for Vocational Rehabilitation No      Education: Education Goals: Education classes will be provided on a weekly basis, covering required topics. Participant will state understanding/return demonstration of topics presented.  Learning Barriers/Preferences:     Learning Barriers/Preferences - 10/31/15 1145      Learning Barriers/Preferences   Learning Barriers None   Learning Preferences None      Education Topics: General Nutrition Guidelines/Fats and Fiber: -Group instruction provided by verbal, written material, models and posters to present the general guidelines for heart healthy nutrition. Gives an explanation and review of dietary fats and fiber.   Controlling Sodium/Reading Food Labels: -Group verbal and written material supporting the discussion of sodium use in heart healthy nutrition. Review and explanation with models, verbal and written materials for utilization of the food label.   Exercise Physiology & Risk Factors: - Group verbal and written instruction with models to review the exercise physiology of the cardiovascular system and associated critical values. Details cardiovascular disease risk factors and the goals associated with each risk factor.   Aerobic Exercise & Resistance Training: - Gives group verbal and written discussion on the health impact of inactivity. On the components of aerobic and resistive training programs and the benefits of this training and how to safely progress through these programs.  Flexibility, Balance, General Exercise Guidelines: - Provides group verbal and written instruction on the benefits  of flexibility and balance training programs. Provides general exercise guidelines with specific guidelines to those with heart or lung disease. Demonstration and skill practice provided.   Stress Management: - Provides group verbal and written instruction about the health risks of elevated stress, cause of high stress, and healthy ways to reduce stress.   Depression: - Provides group verbal and written instruction on the correlation between heart/lung disease and depressed mood, treatment options, and the stigmas associated with seeking treatment.   Anatomy & Physiology of the Heart: - Group verbal and written instruction and models provide basic cardiac anatomy and physiology, with the coronary electrical and arterial systems. Review of: AMI, Angina, Valve disease, Heart Failure, Cardiac Arrhythmia, Pacemakers, and the ICD.   Cardiac Procedures: - Group verbal and written instruction and models to describe the testing methods done to diagnose heart disease. Reviews the outcomes of the test results. Describes the treatment choices: Medical Management, Angioplasty, or Coronary Bypass Surgery.   Cardiac Medications: - Group verbal and written instruction to review commonly prescribed medications for heart disease. Reviews the medication, class of the drug, and side effects. Includes the steps to properly store meds and maintain the prescription regimen.   Go Sex-Intimacy & Heart Disease, Get SMART - Goal Setting: - Group verbal and written instruction through game format to discuss heart disease and the return to sexual intimacy. Provides group verbal and written material to discuss and apply goal setting through the application of the S.M.A.R.T. Method.   Other Matters of the Heart: - Provides group verbal, written materials and models to describe Heart Failure, Angina, Valve Disease, and Diabetes in the realm of heart disease. Includes description of the disease process and treatment  options available to the cardiac patient.   Exercise & Equipment Safety: - Individual verbal instruction and demonstration of equipment use and safety with use of the equipment. Flowsheet Row Cardiac Rehab from 10/30/2015 in [facility] Cardiac and Pulmonary Rehab  Date  10/30/15  Educator  C. EnterkinRN  Instruction Review Code  1- partially meets, needs review/practice      Infection Prevention: - Provides verbal and written material to individual with discussion of infection control including proper hand washing and proper equipment cleaning during exercise session. Flowsheet Row Cardiac Rehab from 10/30/2015 in [facility] Cardiac and Pulmonary Rehab  Date  10/30/15  Educator  C. EnterkinRN  Instruction Review Code  2- meets goals/outcomes      Falls Prevention: - Provides verbal and written material to individual with discussion of falls prevention and safety. Flowsheet Row Cardiac Rehab from 10/30/2015 in [facility] Cardiac and Pulmonary Rehab  Date  10/30/15  Educator  C. [place]  Instruction Review Code  2- meets goals/outcomes      Diabetes: - Individual verbal and written instruction to review signs/symptoms of diabetes, desired ranges of glucose level fasting, after meals and with exercise. Advice that pre and post exercise glucose checks will be done for 3 sessions at entry of program. [place] from 10/30/2015 in [facility] Cardiac and Pulmonary Rehab  Date  10/30/15  Educator  C.EnterkinRN  Instruction Review Code  1- partially meets, needs review/practice       Knowledge Questionnaire Score:     Knowledge Questionnaire Score - 10/31/15 1145      Knowledge Questionnaire Score   Pre Score 20      Core Components/Risk Factors/Patient Goals at Admission:  Personal Goals and Risk Factors at Admission - 10/31/15 1147      Core Components/Risk Factors/Patient Goals on Admission   Improve shortness of breath with ADL's Yes   Intervention Provide education,  individualized exercise plan and daily activity instruction to help decrease symptoms of SOB with activities of daily living.   Expected Outcomes Short Term: Achieves a reduction of symptoms when performing activities of daily living.   Develop more efficient breathing techniques such as purse lipped breathing and diaphragmatic breathing; and practicing self-pacing with activity Yes   Intervention Provide education, demonstration and support about specific breathing techniuqes utilized for more efficient breathing. Include techniques such as pursed lipped breathing, diaphragmatic breathing and self-pacing activity.   Expected Outcomes Short Term: Participant will be able to demonstrate and use breathing techniques as needed throughout daily activities.   Diabetes Yes      Core Components/Risk Factors/Patient Goals Review:    Core Components/Risk Factors/Patient Goals at Discharge (Final Review):    ITP Comments:     ITP Comments    Row Name 10/30/15 1302 10/30/15 1305         ITP Comments Upon arrival Letha c/o being dizzy today and yesterday. I had her sit down and took her blood pressure it was 80/60 in her left arm and right arm 78/50 so given 480 cc of water. Heart rate 107. AFter the water her blood pressure was 92/60 and heart rate 102 but she said she felt ok so 6 minute walk in the hallway was done. I watched the heart monitor screen while REbecca Sicles timed her 6 minute walk test etd Djuna is a diabetic on insulin. Her fasting blood sugar reported from home today was 126         Comments:

## 2015-10-31 NOTE — Progress Notes (Signed)
Cardiac Individual Treatment Plan  Patient Details  Name: Diana Frost MRN: [id number] Date of Birth: 10/10/1945 Referring Provider:   Flowsheet Row Cardiac Rehab from 10/30/2015 in [facility] Cardiac and Pulmonary Rehab  Referring Provider  Diana Frost      Initial Encounter Date:  Flowsheet Row Cardiac Rehab from 10/30/2015 in [facility] Cardiac and Pulmonary Rehab  Date  10/30/15  Referring Provider  Diana Frost      Visit Diagnosis: Stable angina [facility])  Patient's Home Medications on Admission:  Current Outpatient Prescriptions:  .  aspirin 81 MG tablet, Take 81 mg by mouth daily., Disp: , Rfl:  .  cephALEXin (KEFLEX) 500 MG capsule, Take 1 capsule (500 mg total) by mouth 3 (three) times daily., Disp: 30 capsule, Rfl: 0 .  cephALEXin (KEFLEX) 500 MG capsule, Take 1 capsule (500 mg total) by mouth 3 (three) times daily., Disp: 30 capsule, Rfl: 0 .  FLUoxetine (PROZAC) 40 MG capsule, Take 40 mg by mouth daily., Disp: , Rfl:  .  Glucagon, rDNA, (GLUCAGON EMERGENCY IJ), Inject as directed as needed (for low blood sugar)., Disp: , Rfl:  .  ibuprofen (ADVIL,MOTRIN) 800 MG tablet, Take 1 tablet (800 mg total) by mouth every 8 (eight) hours as needed for moderate pain., Disp: 15 tablet, Rfl: 0 .  insulin glargine (LANTUS) 100 UNIT/ML injection, Inject into the skin. As needed if insulin pump is not working, Disp: , Rfl:  .  insulin lispro (HUMALOG) 100 UNIT/ML injection, Inject into the skin. In insulin pump, Disp: , Rfl:  .  losartan (COZAAR) 25 MG tablet, Take 25 mg by mouth daily., Disp: , Rfl:  .  Melatonin 5 MG TABS, Take 10 mg by mouth at bedtime as needed., Disp: , Rfl:  .  metoprolol tartrate (LOPRESSOR) 25 MG tablet, Take 12.5 mg by mouth 2 (two) times daily. Pt only takes 1x/day, Disp: , Rfl:  .  Multiple Vitamin (MULTIVITAMIN) capsule, Take 1 capsule by mouth daily., Disp: , Rfl:  .  ondansetron (ZOFRAN-ODT) 4 MG disintegrating tablet, Take 4 mg by mouth every 8 (eight) hours as needed  for nausea or vomiting., Disp: , Rfl:  .  rosuvastatin (CRESTOR) 10 MG tablet, Take 10 mg by mouth daily., Disp: , Rfl:   Past Medical History: Past Medical History:  Diagnosis Date  . Coronary artery disease   . Diabetes mellitus without complication (HCC)    insulin pump  . Headache    since ear problem started  . Hypertension   . Myocardial infarction ([place]) 11/01/13    Tobacco Use: History  Smoking Status  . Never Smoker  Smokeless Tobacco  . Never Used    Labs: Recent Review Flowsheet Data    There is no flowsheet data to display.       Exercise Target Goals: Date: 10/30/15  Exercise Program Goal: Individual exercise prescription set with THRR, safety & activity barriers. Participant demonstrates ability to understand and report RPE using BORG scale, to self-measure pulse accurately, and to acknowledge the importance of the exercise prescription.  Exercise Prescription Goal: Starting with aerobic activity 30 plus minutes a day, 3 days per week for initial exercise prescription. Provide home exercise prescription and guidelines that participant acknowledges understanding prior to discharge.  Activity Barriers & Risk Stratification:     Activity Barriers & Cardiac Risk Stratification - 10/30/15 1259      Activity Barriers & Cardiac Risk Stratification   Cardiac Risk Stratification High      6 Minute Walk:  [name] Name 10/30/15 1313         6 Minute Walk   Distance 1250 feet     Walk Time 6 minutes     MPH 2.36     METS 2.77     RPE 12     Perceived Dyspnea  2     VO2 Peak 9.7     Symptoms No     Resting HR 98 bpm     Resting BP 92/60     Max Ex. HR 112 bpm     Max Ex. BP 104/58        Initial Exercise Prescription:     Initial Exercise Prescription - 10/30/15 1300      Date of Initial Exercise RX and Referring Provider   Date 10/30/15   Referring Provider Diana Frost     Treadmill   MPH 2   Grade 0.5   Minutes 15    METs 2.67     Recumbant Bike   Level 1   RPM 60   Watts 10   Minutes 15   METs 2.52     NuStep   Level 1   Watts 10   Minutes 15   METs 2.52     REL-XR   Level 1   Watts 30   Minutes 15   METs 2.5     Biostep-RELP   Level 1   Watts 15   Minutes 15   METs 2.5     Prescription Details   Frequency (times per week) 3   Duration Progress to 45 minutes of aerobic exercise without signs/symptoms of physical distress     Intensity   THRR 40-80% of Max Heartrate 118-139   Ratings of Perceived Exertion 11-15   Perceived Dyspnea 0-4     Progression   Progression Continue to progress workloads to maintain intensity without signs/symptoms of physical distress.     Resistance Training   Training Prescription Yes   Weight 2   Reps 10-15      Perform Capillary Blood Glucose checks as needed.  Exercise Prescription Changes:   Exercise Comments:   Discharge Exercise Prescription (Final Exercise Prescription Changes):   Nutrition:  Target Goals: Understanding of nutrition guidelines, daily intake of sodium 1500mg , cholesterol 200mg , calories 30% from fat and 7% or less from saturated fats, daily to have 5 or more servings of fruits and vegetables.  Biometrics:     Pre Biometrics - 10/30/15 1310      Pre Biometrics   Height 5' 1.4" (1.56 m)   Weight 127 lb 11.2 oz (57.9 kg)   Waist Circumference 32 inches   Hip Circumference 37 inches   Waist to Hip Ratio 0.86 %   BMI (Calculated) 23.9       Nutrition Therapy Plan and Nutrition Goals:     Nutrition Therapy & Goals - 10/30/15 1304      Nutrition Therapy   Drug/Food Interactions Statins/Certain Fruits     Personal Nutrition Goals   Personal Goal #1 --  Diana Frost is a diabetic on insulin. Her fasting blood sugar reported from home today was 126.      Intervention Plan   Intervention Prescribe, educate and counsel regarding individualized specific dietary modifications aiming towards targeted core  components such as weight, hypertension, lipid management, diabetes, heart failure and other comorbidities.   Expected Outcomes Short Term Goal: Understand basic principles of dietary content, such as calories, fat, sodium, cholesterol and  nutrients.;Short Term Goal: A plan has been developed with personal nutrition goals set during dietitian appointment.;Long Term Goal: Adherence to prescribed nutrition plan.      Nutrition Discharge: Rate Your Plate Scores:   Nutrition Goals Re-Evaluation:   Psychosocial: Target Goals: Acknowledge presence or absence of depression, maximize coping skills, provide positive support system. Participant is able to verbalize types and ability to use techniques and skills needed for reducing stress and depression.  Initial Review & Psychosocial Screening:     Initial Psych Review & Screening - 10/30/15 1305      Initial Review   Current issues with Current Stress Concerns   Source of Stress Concerns Chronic Illness   Comments Her husband has a chronic illness condition.      Family Dynamics   Good Support System? Yes     Barriers   Psychosocial barriers to participate in program The patient should benefit from training in stress management and relaxation.     Screening Interventions   Interventions Encouraged to exercise;Program counselor consult      Quality of Life Scores:     Quality of Life - 10/31/15 1148      Quality of Life Scores   Health/Function Pre 24 %   Socioeconomic Pre 27 %   Psych/Spiritual Pre 24.86 %   Family Pre 30 %   GLOBAL Pre 25.74 %      PHQ-9: Recent Review Flowsheet Data    Depression screen [facility] 2/9 10/30/2015   Decreased Interest 1   Down, Depressed, Hopeless 1   PHQ - 2 Score 2   Altered sleeping 1   Tired, decreased energy 2   Change in appetite 1   Feeling bad or failure about yourself  1   Trouble concentrating 0   Moving slowly or fidgety/restless 0   Suicidal thoughts 0   PHQ-9 Score 7       Psychosocial Evaluation and Intervention:   Psychosocial Re-Evaluation:   Vocational Rehabilitation: Provide vocational rehab assistance to qualifying candidates.   Vocational Rehab Evaluation & Intervention:     Vocational Rehab - 10/30/15 1259      Initial Vocational Rehab Evaluation & Intervention   Assessment shows need for Vocational Rehabilitation No      Education: Education Goals: Education classes will be provided on a weekly basis, covering required topics. Participant will state understanding/return demonstration of topics presented.  Learning Barriers/Preferences:     Learning Barriers/Preferences - 10/31/15 1145      Learning Barriers/Preferences   Learning Barriers None   Learning Preferences None      Education Topics: General Nutrition Guidelines/Fats and Fiber: -Group instruction provided by verbal, written material, models and posters to present the general guidelines for heart healthy nutrition. Gives an explanation and review of dietary fats and fiber.   Controlling Sodium/Reading Food Labels: -Group verbal and written material supporting the discussion of sodium use in heart healthy nutrition. Review and explanation with models, verbal and written materials for utilization of the food label.   Exercise Physiology & Risk Factors: - Group verbal and written instruction with models to review the exercise physiology of the cardiovascular system and associated critical values. Details cardiovascular disease risk factors and the goals associated with each risk factor.   Aerobic Exercise & Resistance Training: - Gives group verbal and written discussion on the health impact of inactivity. On the components of aerobic and resistive training programs and the benefits of this training and how to safely progress through these programs.  Flexibility, Balance, General Exercise Guidelines: - Provides group verbal and written instruction on the benefits  of flexibility and balance training programs. Provides general exercise guidelines with specific guidelines to those with heart or lung disease. Demonstration and skill practice provided.   Stress Management: - Provides group verbal and written instruction about the health risks of elevated stress, cause of high stress, and healthy ways to reduce stress.   Depression: - Provides group verbal and written instruction on the correlation between heart/lung disease and depressed mood, treatment options, and the stigmas associated with seeking treatment.   Anatomy & Physiology of the Heart: - Group verbal and written instruction and models provide basic cardiac anatomy and physiology, with the coronary electrical and arterial systems. Review of: AMI, Angina, Valve disease, Heart Failure, Cardiac Arrhythmia, Pacemakers, and the ICD.   Cardiac Procedures: - Group verbal and written instruction and models to describe the testing methods done to diagnose heart disease. Reviews the outcomes of the test results. Describes the treatment choices: Medical Management, Angioplasty, or Coronary Bypass Surgery.   Cardiac Medications: - Group verbal and written instruction to review commonly prescribed medications for heart disease. Reviews the medication, class of the drug, and side effects. Includes the steps to properly store meds and maintain the prescription regimen.   Go Sex-Intimacy & Heart Disease, Get SMART - Goal Setting: - Group verbal and written instruction through game format to discuss heart disease and the return to sexual intimacy. Provides group verbal and written material to discuss and apply goal setting through the application of the S.M.A.R.T. Method.   Other Matters of the Heart: - Provides group verbal, written materials and models to describe Heart Failure, Angina, Valve Disease, and Diabetes in the realm of heart disease. Includes description of the disease process and treatment  options available to the cardiac patient.   Exercise & Equipment Safety: - Individual verbal instruction and demonstration of equipment use and safety with use of the equipment. Flowsheet Row Cardiac Rehab from 10/30/2015 in [facility] Cardiac and Pulmonary Rehab  Date  10/30/15  Educator  C. EnterkinRN  Instruction Review Code  1- partially meets, needs review/practice      Infection Prevention: - Provides verbal and written material to individual with discussion of infection control including proper hand washing and proper equipment cleaning during exercise session. Flowsheet Row Cardiac Rehab from 10/30/2015 in [facility] Cardiac and Pulmonary Rehab  Date  10/30/15  Educator  C. EnterkinRN  Instruction Review Code  2- meets goals/outcomes      Falls Prevention: - Provides verbal and written material to individual with discussion of falls prevention and safety. Flowsheet Row Cardiac Rehab from 10/30/2015 in [facility] Cardiac and Pulmonary Rehab  Date  10/30/15  Educator  C. [place]  Instruction Review Code  2- meets goals/outcomes      Diabetes: - Individual verbal and written instruction to review signs/symptoms of diabetes, desired ranges of glucose level fasting, after meals and with exercise. Advice that pre and post exercise glucose checks will be done for 3 sessions at entry of program. [place] from 10/30/2015 in [facility] Cardiac and Pulmonary Rehab  Date  10/30/15  Educator  C.[email or web address]  Instruction Review Code  1- partially meets, needs review/practice       Knowledge Questionnaire Score:     Knowledge Questionnaire Score - 10/31/15 1145      Knowledge Questionnaire Score   Pre Score 20      Core Components/Risk Factors/Patient Goals at Admission:  Personal Goals and Risk Factors at Admission - 10/31/15 1147      Core Components/Risk Factors/Patient Goals on Admission   Improve shortness of breath with ADL's Yes   Intervention Provide education,  individualized exercise plan and daily activity instruction to help decrease symptoms of SOB with activities of daily living.   Expected Outcomes Short Term: Achieves a reduction of symptoms when performing activities of daily living.   Develop more efficient breathing techniques such as purse lipped breathing and diaphragmatic breathing; and practicing self-pacing with activity Yes   Intervention Provide education, demonstration and support about specific breathing techniuqes utilized for more efficient breathing. Include techniques such as pursed lipped breathing, diaphragmatic breathing and self-pacing activity.   Expected Outcomes Short Term: Participant will be able to demonstrate and use breathing techniques as needed throughout daily activities.   Diabetes Yes      Core Components/Risk Factors/Patient Goals Review:    Core Components/Risk Factors/Patient Goals at Discharge (Final Review):    ITP Comments:     ITP Comments    Row Name 10/30/15 1302 10/30/15 1305         ITP Comments Upon arrival Diana Frost c/o being dizzy today and yesterday. I had her sit down and took her blood pressure it was 80/60 in her left arm and right arm 78/50 so given 480 cc of water. Heart rate 107. AFter the water her blood pressure was 92/60 and heart rate 102 but she said she felt ok so 6 minute walk in the hallway was done. I watched the heart monitor screen while REbecca Sicles timed her 6 minute walk test etd Diana Frost is a diabetic on insulin. Her fasting blood sugar reported from home today was 126         Comments:

## 2015-10-31 NOTE — Progress Notes (Signed)
Cardiac Individual Treatment Plan  Patient Details  Name: Diana Frost MRN: [id number] Date of Birth: December 17, 1945 Referring Provider:   Flowsheet Row Cardiac Rehab from 10/30/2015 in [facility] Cardiac and Pulmonary Rehab  Referring Provider  Diana Frost      Initial Encounter Date:  Flowsheet Row Cardiac Rehab from 10/30/2015 in [facility] Cardiac and Pulmonary Rehab  Date  10/30/15  Referring Provider  Diana Frost      Visit Diagnosis: Stable angina [facility])  Patient's Home Medications on Admission:  Current Outpatient Prescriptions:  .  aspirin 81 MG tablet, Take 81 mg by mouth daily., Disp: , Rfl:  .  cephALEXin (KEFLEX) 500 MG capsule, Take 1 capsule (500 mg total) by mouth 3 (three) times daily., Disp: 30 capsule, Rfl: 0 .  cephALEXin (KEFLEX) 500 MG capsule, Take 1 capsule (500 mg total) by mouth 3 (three) times daily., Disp: 30 capsule, Rfl: 0 .  FLUoxetine (PROZAC) 40 MG capsule, Take 40 mg by mouth daily., Disp: , Rfl:  .  Glucagon, rDNA, (GLUCAGON EMERGENCY IJ), Inject as directed as needed (for low blood sugar)., Disp: , Rfl:  .  ibuprofen (ADVIL,MOTRIN) 800 MG tablet, Take 1 tablet (800 mg total) by mouth every 8 (eight) hours as needed for moderate pain., Disp: 15 tablet, Rfl: 0 .  insulin glargine (LANTUS) 100 UNIT/ML injection, Inject into the skin. As needed if insulin pump is not working, Disp: , Rfl:  .  insulin lispro (HUMALOG) 100 UNIT/ML injection, Inject into the skin. In insulin pump, Disp: , Rfl:  .  losartan (COZAAR) 25 MG tablet, Take 25 mg by mouth daily., Disp: , Rfl:  .  Melatonin 5 MG TABS, Take 10 mg by mouth at bedtime as needed., Disp: , Rfl:  .  metoprolol tartrate (LOPRESSOR) 25 MG tablet, Take 12.5 mg by mouth 2 (two) times daily. Pt only takes 1x/day, Disp: , Rfl:  .  Multiple Vitamin (MULTIVITAMIN) capsule, Take 1 capsule by mouth daily., Disp: , Rfl:  .  ondansetron (ZOFRAN-ODT) 4 MG disintegrating tablet, Take 4 mg by mouth every 8 (eight) hours as needed  for nausea or vomiting., Disp: , Rfl:  .  rosuvastatin (CRESTOR) 10 MG tablet, Take 10 mg by mouth daily., Disp: , Rfl:   Past Medical History: Past Medical History:  Diagnosis Date  . Coronary artery disease   . Diabetes mellitus without complication (HCC)    insulin pump  . Headache    since ear problem started  . Hypertension   . Myocardial infarction ([place]) 11/01/13    Tobacco Use: History  Smoking Status  . Never Smoker  Smokeless Tobacco  . Never Used    Labs: Recent Review Flowsheet Data    There is no flowsheet data to display.       Exercise Target Goals: Date: 10/30/15  Exercise Program Goal: Individual exercise prescription set with THRR, safety & activity barriers. Participant demonstrates ability to understand and report RPE using BORG scale, to self-measure pulse accurately, and to acknowledge the importance of the exercise prescription.  Exercise Prescription Goal: Starting with aerobic activity 30 plus minutes a day, 3 days per week for initial exercise prescription. Provide home exercise prescription and guidelines that participant acknowledges understanding prior to discharge.  Activity Barriers & Risk Stratification:     Activity Barriers & Cardiac Risk Stratification - 10/30/15 1259      Activity Barriers & Cardiac Risk Stratification   Cardiac Risk Stratification High      6 Minute Walk:  [place] Name 10/30/15 1313         6 Minute Walk   Distance 1250 feet     Walk Time 6 minutes     MPH 2.36     METS 2.77     RPE 12     Perceived Dyspnea  2     VO2 Peak 9.7     Symptoms No     Resting HR 98 bpm     Resting BP 92/60     Max Ex. HR 112 bpm     Max Ex. BP 104/58        Initial Exercise Prescription:     Initial Exercise Prescription - 10/30/15 1300      Date of Initial Exercise RX and Referring Provider   Date 10/30/15   Referring Provider Diana Frost     Treadmill   MPH 2   Grade 0.5   Minutes 15    METs 2.67     Recumbant Bike   Level 1   RPM 60   Watts 10   Minutes 15   METs 2.52     NuStep   Level 1   Watts 10   Minutes 15   METs 2.52     REL-XR   Level 1   Watts 30   Minutes 15   METs 2.5     Biostep-RELP   Level 1   Watts 15   Minutes 15   METs 2.5     Prescription Details   Frequency (times per week) 3   Duration Progress to 45 minutes of aerobic exercise without signs/symptoms of physical distress     Intensity   THRR 40-80% of Max Heartrate 118-139   Ratings of Perceived Exertion 11-15   Perceived Dyspnea 0-4     Progression   Progression Continue to progress workloads to maintain intensity without signs/symptoms of physical distress.     Resistance Training   Training Prescription Yes   Weight 2   Reps 10-15      Perform Capillary Blood Glucose checks as needed.  Exercise Prescription Changes:   Exercise Comments:   Discharge Exercise Prescription (Final Exercise Prescription Changes):   Nutrition:  Target Goals: Understanding of nutrition guidelines, daily intake of sodium 1500mg , cholesterol 200mg , calories 30% from fat and 7% or less from saturated fats, daily to have 5 or more servings of fruits and vegetables.  Biometrics:     Pre Biometrics - 10/30/15 1310      Pre Biometrics   Height 5' 1.4" (1.56 m)   Weight 127 lb 11.2 oz (57.9 kg)   Waist Circumference 32 inches   Hip Circumference 37 inches   Waist to Hip Ratio 0.86 %   BMI (Calculated) 23.9       Nutrition Therapy Plan and Nutrition Goals:     Nutrition Therapy & Goals - 10/30/15 1304      Nutrition Therapy   Drug/Food Interactions Statins/Certain Fruits     Personal Nutrition Goals   Personal Goal #1 --  Diana Frost is a diabetic on insulin. Her fasting blood sugar reported from home today was 126.      Intervention Plan   Intervention Prescribe, educate and counsel regarding individualized specific dietary modifications aiming towards targeted core  components such as weight, hypertension, lipid management, diabetes, heart failure and other comorbidities.   Expected Outcomes Short Term Goal: Understand basic principles of dietary content, such as calories, fat, sodium, cholesterol and  nutrients.;Short Term Goal: A plan has been developed with personal nutrition goals set during dietitian appointment.;Long Term Goal: Adherence to prescribed nutrition plan.      Nutrition Discharge: Rate Your Plate Scores:   Nutrition Goals Re-Evaluation:   Psychosocial: Target Goals: Acknowledge presence or absence of depression, maximize coping skills, provide positive support system. Participant is able to verbalize types and ability to use techniques and skills needed for reducing stress and depression.  Initial Review & Psychosocial Screening:     Initial Psych Review & Screening - 10/30/15 1305      Initial Review   Current issues with Current Stress Concerns   Source of Stress Concerns Chronic Illness   Comments Her husband has a chronic illness condition.      Family Dynamics   Good Support System? Yes     Barriers   Psychosocial barriers to participate in program The patient should benefit from training in stress management and relaxation.     Screening Interventions   Interventions Encouraged to exercise;Program counselor consult      Quality of Life Scores:     Quality of Life - 10/31/15 1148      Quality of Life Scores   Health/Function Pre 24 %   Socioeconomic Pre 27 %   Psych/Spiritual Pre 24.86 %   Family Pre 30 %   GLOBAL Pre 25.74 %      PHQ-9: Recent Review Flowsheet Data    Depression screen [facility] 2/9 10/30/2015   Decreased Interest 1   Down, Depressed, Hopeless 1   PHQ - 2 Score 2   Altered sleeping 1   Tired, decreased energy 2   Change in appetite 1   Feeling bad or failure about yourself  1   Trouble concentrating 0   Moving slowly or fidgety/restless 0   Suicidal thoughts 0   PHQ-9 Score 7       Psychosocial Evaluation and Intervention:   Psychosocial Re-Evaluation:   Vocational Rehabilitation: Provide vocational rehab assistance to qualifying candidates.   Vocational Rehab Evaluation & Intervention:     Vocational Rehab - 10/30/15 1259      Initial Vocational Rehab Evaluation & Intervention   Assessment shows need for Vocational Rehabilitation No      Education: Education Goals: Education classes will be provided on a weekly basis, covering required topics. Participant will state understanding/return demonstration of topics presented.  Learning Barriers/Preferences:     Learning Barriers/Preferences - 10/31/15 1145      Learning Barriers/Preferences   Learning Barriers None   Learning Preferences None      Education Topics: General Nutrition Guidelines/Fats and Fiber: -Group instruction provided by verbal, written material, models and posters to present the general guidelines for heart healthy nutrition. Gives an explanation and review of dietary fats and fiber.   Controlling Sodium/Reading Food Labels: -Group verbal and written material supporting the discussion of sodium use in heart healthy nutrition. Review and explanation with models, verbal and written materials for utilization of the food label.   Exercise Physiology & Risk Factors: - Group verbal and written instruction with models to review the exercise physiology of the cardiovascular system and associated critical values. Details cardiovascular disease risk factors and the goals associated with each risk factor.   Aerobic Exercise & Resistance Training: - Gives group verbal and written discussion on the health impact of inactivity. On the components of aerobic and resistive training programs and the benefits of this training and how to safely progress through these programs.  Flexibility, Balance, General Exercise Guidelines: - Provides group verbal and written instruction on the benefits  of flexibility and balance training programs. Provides general exercise guidelines with specific guidelines to those with heart or lung disease. Demonstration and skill practice provided.   Stress Management: - Provides group verbal and written instruction about the health risks of elevated stress, cause of high stress, and healthy ways to reduce stress.   Depression: - Provides group verbal and written instruction on the correlation between heart/lung disease and depressed mood, treatment options, and the stigmas associated with seeking treatment.   Anatomy & Physiology of the Heart: - Group verbal and written instruction and models provide basic cardiac anatomy and physiology, with the coronary electrical and arterial systems. Review of: AMI, Angina, Valve disease, Heart Failure, Cardiac Arrhythmia, Pacemakers, and the ICD.   Cardiac Procedures: - Group verbal and written instruction and models to describe the testing methods done to diagnose heart disease. Reviews the outcomes of the test results. Describes the treatment choices: Medical Management, Angioplasty, or Coronary Bypass Surgery.   Cardiac Medications: - Group verbal and written instruction to review commonly prescribed medications for heart disease. Reviews the medication, class of the drug, and side effects. Includes the steps to properly store meds and maintain the prescription regimen.   Go Sex-Intimacy & Heart Disease, Get SMART - Goal Setting: - Group verbal and written instruction through game format to discuss heart disease and the return to sexual intimacy. Provides group verbal and written material to discuss and apply goal setting through the application of the S.M.A.R.T. Method.   Other Matters of the Heart: - Provides group verbal, written materials and models to describe Heart Failure, Angina, Valve Disease, and Diabetes in the realm of heart disease. Includes description of the disease process and treatment  options available to the cardiac patient.   Exercise & Equipment Safety: - Individual verbal instruction and demonstration of equipment use and safety with use of the equipment. Flowsheet Row Cardiac Rehab from 10/30/2015 in [facility] Cardiac and Pulmonary Rehab  Date  10/30/15  Educator  C. EnterkinRN  Instruction Review Code  1- partially meets, needs review/practice      Infection Prevention: - Provides verbal and written material to individual with discussion of infection control including proper hand washing and proper equipment cleaning during exercise session. Flowsheet Row Cardiac Rehab from 10/30/2015 in [facility] Cardiac and Pulmonary Rehab  Date  10/30/15  Educator  C. EnterkinRN  Instruction Review Code  2- meets goals/outcomes      Falls Prevention: - Provides verbal and written material to individual with discussion of falls prevention and safety. Flowsheet Row Cardiac Rehab from 10/30/2015 in [facility] Cardiac and Pulmonary Rehab  Date  10/30/15  Educator  C. Shelby  Instruction Review Code  2- meets goals/outcomes      Diabetes: - Individual verbal and written instruction to review signs/symptoms of diabetes, desired ranges of glucose level fasting, after meals and with exercise. Advice that pre and post exercise glucose checks will be done for 3 sessions at entry of program. [place] from 10/30/2015 in [facility] Cardiac and Pulmonary Rehab  Date  10/30/15  Educator  C.EnterkinRN  Instruction Review Code  1- partially meets, needs review/practice       Knowledge Questionnaire Score:     Knowledge Questionnaire Score - 10/31/15 1145      Knowledge Questionnaire Score   Pre Score 20      Core Components/Risk Factors/Patient Goals at Admission:  Personal Goals and Risk Factors at Admission - 10/31/15 1147      Core Components/Risk Factors/Patient Goals on Admission   Improve shortness of breath with ADL's Yes   Intervention Provide education,  individualized exercise plan and daily activity instruction to help decrease symptoms of SOB with activities of daily living.   Expected Outcomes Short Term: Achieves a reduction of symptoms when performing activities of daily living.   Develop more efficient breathing techniques such as purse lipped breathing and diaphragmatic breathing; and practicing self-pacing with activity Yes   Intervention Provide education, demonstration and support about specific breathing techniuqes utilized for more efficient breathing. Include techniques such as pursed lipped breathing, diaphragmatic breathing and self-pacing activity.   Expected Outcomes Short Term: Participant will be able to demonstrate and use breathing techniques as needed throughout daily activities.   Diabetes Yes      Core Components/Risk Factors/Patient Goals Review:    Core Components/Risk Factors/Patient Goals at Discharge (Final Review):    ITP Comments:     ITP Comments    Row Name 10/30/15 1302 10/30/15 1305         ITP Comments Upon arrival [place] c/o being dizzy today and yesterday. I had her sit down and took her blood pressure it was 80/60 in her left arm and right arm 78/50 so given 480 cc of water. Heart rate 107. AFter the water her blood pressure was 92/60 and heart rate 102 but she said she felt ok so 6 minute walk in the hallway was done. I watched the heart monitor screen while REbecca Sicles timed her 6 minute walk test etd Suhailah is a diabetic on insulin. Her fasting blood sugar reported from home today was 126         Comments:

## 2015-10-31 NOTE — Patient Instructions (Signed)
Patient Instructions  Patient Details  Name: Diana Frost MRN: [id number] Date of Birth: [birth year] Referring Provider:  Quenton Fetter,*  Below are the personal goals you chose as well as exercise and nutrition goals. Our goal is to help you keep on track towards obtaining and maintaining your goals. We will be discussing your progress on these goals with you throughout the program.  Initial Exercise Prescription:     Initial Exercise Prescription - 10/30/15 1300      Date of Initial Exercise RX and Referring Provider   Date 10/30/15   Referring Provider Sheppard Coil     Treadmill   MPH 2   Grade 0.5   Minutes 15   METs 2.67     Recumbant Bike   Level 1   RPM 60   Watts 10   Minutes 15   METs 2.52     NuStep   Level 1   Watts 10   Minutes 15   METs 2.52     REL-XR   Level 1   Watts 30   Minutes 15   METs 2.5     Biostep-RELP   Level 1   Watts 15   Minutes 15   METs 2.5     Prescription Details   Frequency (times per week) 3   Duration Progress to 45 minutes of aerobic exercise without signs/symptoms of physical distress     Intensity   THRR 40-80% of Max Heartrate 118-139   Ratings of Perceived Exertion 11-15   Perceived Dyspnea 0-4     Progression   Progression Continue to progress workloads to maintain intensity without signs/symptoms of physical distress.     Resistance Training   Training Prescription Yes   Weight 2   Reps 10-15      Exercise Goals: Frequency: Be able to perform aerobic exercise three times per week working toward 3-5 days per week.  Intensity: Work with a perceived exertion of 11 (fairly light) - 15 (hard) as tolerated. Follow your new exercise prescription and watch for changes in prescription as you progress with the program. Changes will be reviewed with you when they are made.  Duration: You should be able to do 30 minutes of continuous aerobic exercise in addition to a 5 minute warm-up and a 5 minute  cool-down routine.  Nutrition Goals: Your personal nutrition goals will be established when you do your nutrition analysis with the dietician.  The following are nutrition guidelines to follow: Cholesterol < 200mg /day Sodium < 1500mg /day Fiber: Women over 50 yrs - 21 grams per day  Personal Goals:     Personal Goals and Risk Factors at Admission - 10/31/15 1147      Core Components/Risk Factors/Patient Goals on Admission   Improve shortness of breath with ADL's Yes   Intervention Provide education, individualized exercise plan and daily activity instruction to help decrease symptoms of SOB with activities of daily living.   Expected Outcomes Short Term: Achieves a reduction of symptoms when performing activities of daily living.   Develop more efficient breathing techniques such as purse lipped breathing and diaphragmatic breathing; and practicing self-pacing with activity Yes   Intervention Provide education, demonstration and support about specific breathing techniuqes utilized for more efficient breathing. Include techniques such as pursed lipped breathing, diaphragmatic breathing and self-pacing activity.   Expected Outcomes Short Term: Participant will be able to demonstrate and use breathing techniques as needed throughout daily activities.   Diabetes Yes      Tobacco  Use Initial Evaluation: History  Smoking Status  . Never Smoker  Smokeless Tobacco  . Never Used    Copy of goals given to participant.

## 2015-10-31 NOTE — Progress Notes (Signed)
Cardiac Individual Treatment Plan  Patient Details  Name: MARGAURITE STULLER MRN: [id number] Date of Birth: 1946-01-14 Referring Provider:   Flowsheet Row Cardiac Rehab from 10/30/2015 in [facility] Cardiac and Pulmonary Rehab  Referring Provider  Sheppard Coil      Initial Encounter Date:  Flowsheet Row Cardiac Rehab from 10/30/2015 in [facility] Cardiac and Pulmonary Rehab  Date  10/30/15  Referring Provider  Sheppard Coil      Visit Diagnosis: Stable angina [facility])  Patient's Home Medications on Admission:  Current Outpatient Prescriptions:  .  aspirin 81 MG tablet, Take 81 mg by mouth daily., Disp: , Rfl:  .  cephALEXin (KEFLEX) 500 MG capsule, Take 1 capsule (500 mg total) by mouth 3 (three) times daily., Disp: 30 capsule, Rfl: 0 .  cephALEXin (KEFLEX) 500 MG capsule, Take 1 capsule (500 mg total) by mouth 3 (three) times daily., Disp: 30 capsule, Rfl: 0 .  FLUoxetine (PROZAC) 40 MG capsule, Take 40 mg by mouth daily., Disp: , Rfl:  .  Glucagon, rDNA, (GLUCAGON EMERGENCY IJ), Inject as directed as needed (for low blood sugar)., Disp: , Rfl:  .  ibuprofen (ADVIL,MOTRIN) 800 MG tablet, Take 1 tablet (800 mg total) by mouth every 8 (eight) hours as needed for moderate pain., Disp: 15 tablet, Rfl: 0 .  insulin glargine (LANTUS) 100 UNIT/ML injection, Inject into the skin. As needed if insulin pump is not working, Disp: , Rfl:  .  insulin lispro (HUMALOG) 100 UNIT/ML injection, Inject into the skin. In insulin pump, Disp: , Rfl:  .  losartan (COZAAR) 25 MG tablet, Take 25 mg by mouth daily., Disp: , Rfl:  .  Melatonin 5 MG TABS, Take 10 mg by mouth at bedtime as needed., Disp: , Rfl:  .  metoprolol tartrate (LOPRESSOR) 25 MG tablet, Take 12.5 mg by mouth 2 (two) times daily. Pt only takes 1x/day, Disp: , Rfl:  .  Multiple Vitamin (MULTIVITAMIN) capsule, Take 1 capsule by mouth daily., Disp: , Rfl:  .  ondansetron (ZOFRAN-ODT) 4 MG disintegrating tablet, Take 4 mg by mouth every 8 (eight) hours as needed  for nausea or vomiting., Disp: , Rfl:  .  rosuvastatin (CRESTOR) 10 MG tablet, Take 10 mg by mouth daily., Disp: , Rfl:   Past Medical History: Past Medical History:  Diagnosis Date  . Coronary artery disease   . Diabetes mellitus without complication (HCC)    insulin pump  . Headache    since ear problem started  . Hypertension   . Myocardial infarction ([place]) 11/01/13    Tobacco Use: History  Smoking Status  . Never Smoker  Smokeless Tobacco  . Never Used    Labs: Recent Review Flowsheet Data    There is no flowsheet data to display.       Exercise Target Goals:    Exercise Program Goal: Individual exercise prescription set with THRR, safety & activity barriers. Participant demonstrates ability to understand and report RPE using BORG scale, to self-measure pulse accurately, and to acknowledge the importance of the exercise prescription.  Exercise Prescription Goal: Starting with aerobic activity 30 plus minutes a day, 3 days per week for initial exercise prescription. Provide home exercise prescription and guidelines that participant acknowledges understanding prior to discharge.  Activity Barriers & Risk Stratification:     Activity Barriers & Cardiac Risk Stratification - 10/30/15 1259      Activity Barriers & Cardiac Risk Stratification   Cardiac Risk Stratification High      6 Minute Walk:  [place] Name 10/30/15 1313         6 Minute Walk   Distance 1250 feet     Walk Time 6 minutes     MPH 2.36     METS 2.77     RPE 12     Perceived Dyspnea  2     VO2 Peak 9.7     Symptoms No     Resting HR 98 bpm     Resting BP 92/60     Max Ex. HR 112 bpm     Max Ex. BP 104/58        Initial Exercise Prescription:     Initial Exercise Prescription - 10/30/15 1300      Date of Initial Exercise RX and Referring Provider   Date 10/30/15   Referring Provider Sheppard Coil     Treadmill   MPH 2   Grade 0.5   Minutes 15   METs 2.67      Recumbant Bike   Level 1   RPM 60   Watts 10   Minutes 15   METs 2.52     NuStep   Level 1   Watts 10   Minutes 15   METs 2.52     REL-XR   Level 1   Watts 30   Minutes 15   METs 2.5     Biostep-RELP   Level 1   Watts 15   Minutes 15   METs 2.5     Prescription Details   Frequency (times per week) 3   Duration Progress to 45 minutes of aerobic exercise without signs/symptoms of physical distress     Intensity   THRR 40-80% of Max Heartrate 118-139   Ratings of Perceived Exertion 11-15   Perceived Dyspnea 0-4     Progression   Progression Continue to progress workloads to maintain intensity without signs/symptoms of physical distress.     Resistance Training   Training Prescription Yes   Weight 2   Reps 10-15      Perform Capillary Blood Glucose checks as needed.  Exercise Prescription Changes:   Exercise Comments:   Discharge Exercise Prescription (Final Exercise Prescription Changes):   Nutrition:  Target Goals: Understanding of nutrition guidelines, daily intake of sodium 1500mg , cholesterol 200mg , calories 30% from fat and 7% or less from saturated fats, daily to have 5 or more servings of fruits and vegetables.  Biometrics:     Pre Biometrics - 10/30/15 1310      Pre Biometrics   Height 5' 1.4" (1.56 m)   Weight 127 lb 11.2 oz (57.9 kg)   Waist Circumference 32 inches   Hip Circumference 37 inches   Waist to Hip Ratio 0.86 %   BMI (Calculated) 23.9       Nutrition Therapy Plan and Nutrition Goals:     Nutrition Therapy & Goals - 10/30/15 1304      Nutrition Therapy   Drug/Food Interactions Statins/Certain Fruits     Personal Nutrition Goals   Personal Goal #1 --  Orly is a diabetic on insulin. Her fasting blood sugar reported from home today was 126.      Intervention Plan   Intervention Prescribe, educate and counsel regarding individualized specific dietary modifications aiming towards targeted core components such as  weight, hypertension, lipid management, diabetes, heart failure and other comorbidities.   Expected Outcomes Short Term Goal: Understand basic principles of dietary content, such as calories, fat, sodium, cholesterol and  nutrients.;Short Term Goal: A plan has been developed with personal nutrition goals set during dietitian appointment.;Long Term Goal: Adherence to prescribed nutrition plan.      Nutrition Discharge: Rate Your Plate Scores:   Nutrition Goals Re-Evaluation:   Psychosocial: Target Goals: Acknowledge presence or absence of depression, maximize coping skills, provide positive support system. Participant is able to verbalize types and ability to use techniques and skills needed for reducing stress and depression.  Initial Review & Psychosocial Screening:     Initial Psych Review & Screening - 10/30/15 1305      Initial Review   Current issues with Current Stress Concerns   Source of Stress Concerns Chronic Illness   Comments Her husband has a chronic illness condition.      Family Dynamics   Good Support System? Yes     Barriers   Psychosocial barriers to participate in program The patient should benefit from training in stress management and relaxation.     Screening Interventions   Interventions Encouraged to exercise;Program counselor consult      Quality of Life Scores:     Quality of Life - 10/31/15 1148      Quality of Life Scores   Health/Function Pre 24 %   Socioeconomic Pre 27 %   Psych/Spiritual Pre 24.86 %   Family Pre 30 %   GLOBAL Pre 25.74 %      PHQ-9: Recent Review Flowsheet Data    Depression screen [facility] 2/9 10/30/2015   Decreased Interest 1   Down, Depressed, Hopeless 1   PHQ - 2 Score 2   Altered sleeping 1   Tired, decreased energy 2   Change in appetite 1   Feeling bad or failure about yourself  1   Trouble concentrating 0   Moving slowly or fidgety/restless 0   Suicidal thoughts 0   PHQ-9 Score 7      Psychosocial  Evaluation and Intervention:   Psychosocial Re-Evaluation:   Vocational Rehabilitation: Provide vocational rehab assistance to qualifying candidates.   Vocational Rehab Evaluation & Intervention:     Vocational Rehab - 10/30/15 1259      Initial Vocational Rehab Evaluation & Intervention   Assessment shows need for Vocational Rehabilitation No      Education: Education Goals: Education classes will be provided on a weekly basis, covering required topics. Participant will state understanding/return demonstration of topics presented.  Learning Barriers/Preferences:     Learning Barriers/Preferences - 10/31/15 1145      Learning Barriers/Preferences   Learning Barriers None   Learning Preferences None      Education Topics: General Nutrition Guidelines/Fats and Fiber: -Group instruction provided by verbal, written material, models and posters to present the general guidelines for heart healthy nutrition. Gives an explanation and review of dietary fats and fiber.   Controlling Sodium/Reading Food Labels: -Group verbal and written material supporting the discussion of sodium use in heart healthy nutrition. Review and explanation with models, verbal and written materials for utilization of the food label.   Exercise Physiology & Risk Factors: - Group verbal and written instruction with models to review the exercise physiology of the cardiovascular system and associated critical values. Details cardiovascular disease risk factors and the goals associated with each risk factor.   Aerobic Exercise & Resistance Training: - Gives group verbal and written discussion on the health impact of inactivity. On the components of aerobic and resistive training programs and the benefits of this training and how to safely progress through these programs.  Flexibility, Balance, General Exercise Guidelines: - Provides group verbal and written instruction on the benefits of flexibility and  balance training programs. Provides general exercise guidelines with specific guidelines to those with heart or lung disease. Demonstration and skill practice provided.   Stress Management: - Provides group verbal and written instruction about the health risks of elevated stress, cause of high stress, and healthy ways to reduce stress.   Depression: - Provides group verbal and written instruction on the correlation between heart/lung disease and depressed mood, treatment options, and the stigmas associated with seeking treatment.   Anatomy & Physiology of the Heart: - Group verbal and written instruction and models provide basic cardiac anatomy and physiology, with the coronary electrical and arterial systems. Review of: AMI, Angina, Valve disease, Heart Failure, Cardiac Arrhythmia, Pacemakers, and the ICD.   Cardiac Procedures: - Group verbal and written instruction and models to describe the testing methods done to diagnose heart disease. Reviews the outcomes of the test results. Describes the treatment choices: Medical Management, Angioplasty, or Coronary Bypass Surgery.   Cardiac Medications: - Group verbal and written instruction to review commonly prescribed medications for heart disease. Reviews the medication, class of the drug, and side effects. Includes the steps to properly store meds and maintain the prescription regimen.   Go Sex-Intimacy & Heart Disease, Get SMART - Goal Setting: - Group verbal and written instruction through game format to discuss heart disease and the return to sexual intimacy. Provides group verbal and written material to discuss and apply goal setting through the application of the S.M.A.R.T. Method.   Other Matters of the Heart: - Provides group verbal, written materials and models to describe Heart Failure, Angina, Valve Disease, and Diabetes in the realm of heart disease. Includes description of the disease process and treatment options available to  the cardiac patient.   Exercise & Equipment Safety: - Individual verbal instruction and demonstration of equipment use and safety with use of the equipment. Flowsheet Row Cardiac Rehab from 10/30/2015 in [facility] Cardiac and Pulmonary Rehab  Date  10/30/15  Educator  C. EnterkinRN  Instruction Review Code  1- partially meets, needs review/practice      Infection Prevention: - Provides verbal and written material to individual with discussion of infection control including proper hand washing and proper equipment cleaning during exercise session. Flowsheet Row Cardiac Rehab from 10/30/2015 in [facility] Cardiac and Pulmonary Rehab  Date  10/30/15  Educator  C. EnterkinRN  Instruction Review Code  2- meets goals/outcomes      Falls Prevention: - Provides verbal and written material to individual with discussion of falls prevention and safety. Flowsheet Row Cardiac Rehab from 10/30/2015 in [facility] Cardiac and Pulmonary Rehab  Date  10/30/15  Educator  C. [place]  Instruction Review Code  2- meets goals/outcomes      Diabetes: - Individual verbal and written instruction to review signs/symptoms of diabetes, desired ranges of glucose level fasting, after meals and with exercise. Advice that pre and post exercise glucose checks will be done for 3 sessions at entry of program. [place] from 10/30/2015 in [facility] Cardiac and Pulmonary Rehab  Date  10/30/15  Educator  C.EnterkinRN  Instruction Review Code  1- partially meets, needs review/practice       Knowledge Questionnaire Score:     Knowledge Questionnaire Score - 10/31/15 1145      Knowledge Questionnaire Score   Pre Score 20      Core Components/Risk Factors/Patient Goals at Admission:  Personal Goals and Risk Factors at Admission - 10/31/15 1147      Core Components/Risk Factors/Patient Goals on Admission   Improve shortness of breath with ADL's Yes   Intervention Provide education, individualized exercise  plan and daily activity instruction to help decrease symptoms of SOB with activities of daily living.   Expected Outcomes Short Term: Achieves a reduction of symptoms when performing activities of daily living.   Develop more efficient breathing techniques such as purse lipped breathing and diaphragmatic breathing; and practicing self-pacing with activity Yes   Intervention Provide education, demonstration and support about specific breathing techniuqes utilized for more efficient breathing. Include techniques such as pursed lipped breathing, diaphragmatic breathing and self-pacing activity.   Expected Outcomes Short Term: Participant will be able to demonstrate and use breathing techniques as needed throughout daily activities.   Diabetes Yes      Core Components/Risk Factors/Patient Goals Review:    Core Components/Risk Factors/Patient Goals at Discharge (Final Review):    ITP Comments:     ITP Comments    Row Name 10/30/15 1302 10/30/15 1305 10/31/15 1159       ITP Comments Upon arrival Trellis c/o being dizzy today and yesterday. I had her sit down and took her blood pressure it was 80/60 in her left arm and right arm 78/50 so given 480 cc of water. Heart rate 107. AFter the water her blood pressure was 92/60 and heart rate 102 but she said she felt ok so 6 minute walk in the hallway was done. I watched the heart monitor screen while REbecca Sicles timed her 6 minute walk test etd Jeweliana is a diabetic on insulin. Her fasting blood sugar reported from home today was 126 initial ITP        Comments:

## 2015-11-01 ENCOUNTER — Encounter: Payer: Self-pay | Admitting: *Deleted

## 2015-11-01 DIAGNOSIS — C439 Malignant melanoma of skin, unspecified: Secondary | ICD-10-CM | POA: Insufficient documentation

## 2015-11-04 ENCOUNTER — Ambulatory Visit: Payer: Medicare Other

## 2015-11-04 ENCOUNTER — Encounter
Admission: RE | Admit: 2015-11-04 | Discharge: 2015-11-04 | Disposition: A | Discharge status: Home / Self Care | Payer: Medicare Other | Source: Ambulatory Visit | Attending: Otolaryngology | Admitting: Otolaryngology

## 2015-11-04 DIAGNOSIS — I252 Old myocardial infarction: Secondary | ICD-10-CM | POA: Diagnosis not present

## 2015-11-04 DIAGNOSIS — Z794 Long term (current) use of insulin: Secondary | ICD-10-CM | POA: Diagnosis not present

## 2015-11-04 DIAGNOSIS — H9202 Otalgia, left ear: Secondary | ICD-10-CM | POA: Diagnosis present

## 2015-11-04 DIAGNOSIS — F329 Major depressive disorder, single episode, unspecified: Secondary | ICD-10-CM | POA: Diagnosis not present

## 2015-11-04 DIAGNOSIS — Z79899 Other long term (current) drug therapy: Secondary | ICD-10-CM | POA: Diagnosis not present

## 2015-11-04 DIAGNOSIS — E119 Type 2 diabetes mellitus without complications: Secondary | ICD-10-CM | POA: Diagnosis not present

## 2015-11-04 DIAGNOSIS — Z888 Allergy status to other drugs, medicaments and biological substances status: Secondary | ICD-10-CM | POA: Diagnosis not present

## 2015-11-04 DIAGNOSIS — J392 Other diseases of pharynx: Secondary | ICD-10-CM | POA: Diagnosis not present

## 2015-11-04 DIAGNOSIS — R51 Headache: Secondary | ICD-10-CM | POA: Diagnosis not present

## 2015-11-04 DIAGNOSIS — Z85820 Personal history of malignant melanoma of skin: Secondary | ICD-10-CM | POA: Diagnosis not present

## 2015-11-04 DIAGNOSIS — J00 Acute nasopharyngitis [common cold]: Secondary | ICD-10-CM | POA: Diagnosis not present

## 2015-11-04 DIAGNOSIS — Z887 Allergy status to serum and vaccine status: Secondary | ICD-10-CM | POA: Diagnosis not present

## 2015-11-04 DIAGNOSIS — I25119 Atherosclerotic heart disease of native coronary artery with unspecified angina pectoris: Secondary | ICD-10-CM | POA: Diagnosis not present

## 2015-11-04 DIAGNOSIS — I4581 Long QT syndrome: Secondary | ICD-10-CM | POA: Diagnosis not present

## 2015-11-04 DIAGNOSIS — I10 Essential (primary) hypertension: Secondary | ICD-10-CM | POA: Diagnosis not present

## 2015-11-04 DIAGNOSIS — Z90710 Acquired absence of both cervix and uterus: Secondary | ICD-10-CM | POA: Diagnosis not present

## 2015-11-04 DIAGNOSIS — H6982 Other specified disorders of Eustachian tube, left ear: Secondary | ICD-10-CM | POA: Diagnosis not present

## 2015-11-04 DIAGNOSIS — Z801 Family history of malignant neoplasm of trachea, bronchus and lung: Secondary | ICD-10-CM | POA: Diagnosis not present

## 2015-11-04 DIAGNOSIS — Z955 Presence of coronary angioplasty implant and graft: Secondary | ICD-10-CM | POA: Diagnosis not present

## 2015-11-04 DIAGNOSIS — J9589 Other postprocedural complications and disorders of respiratory system, not elsewhere classified: Secondary | ICD-10-CM | POA: Diagnosis not present

## 2015-11-04 DIAGNOSIS — Z7982 Long term (current) use of aspirin: Secondary | ICD-10-CM | POA: Diagnosis not present

## 2015-11-04 DIAGNOSIS — Z87891 Personal history of nicotine dependence: Secondary | ICD-10-CM | POA: Diagnosis not present

## 2015-11-04 DIAGNOSIS — I9581 Postprocedural hypotension: Secondary | ICD-10-CM | POA: Diagnosis not present

## 2015-11-04 DIAGNOSIS — Z882 Allergy status to sulfonamides status: Secondary | ICD-10-CM | POA: Diagnosis not present

## 2015-11-04 DIAGNOSIS — Z885 Allergy status to narcotic agent status: Secondary | ICD-10-CM | POA: Diagnosis not present

## 2015-11-04 LAB — BASIC METABOLIC PANEL
Anion gap: 10 (ref 5–15)
BUN: 17 mg/dL (ref 6–20)
CALCIUM: 8.8 mg/dL — AB (ref 8.9–10.3)
CO2: 28 mmol/L (ref 22–32)
CREATININE: 1.08 mg/dL — AB (ref 0.44–1.00)
Chloride: 96 mmol/L — ABNORMAL LOW (ref 101–111)
GFR calc Af Amer: 59 mL/min — ABNORMAL LOW (ref >60)
GFR, EST NON AFRICAN AMERICAN: 51 mL/min — AB (ref >60)
Glucose, Bld: 299 mg/dL — ABNORMAL HIGH (ref 65–99)
Potassium: 3.8 mmol/L (ref 3.5–5.1)
SODIUM: 134 mmol/L — AB (ref 135–145)

## 2015-11-04 LAB — HEMOGLOBIN: HEMOGLOBIN: 11.8 g/dL — AB (ref 12.0–16.0)

## 2015-11-04 LAB — SURGICAL PCR SCREEN
MRSA, PCR: NEGATIVE
Staphylococcus aureus: NEGATIVE

## 2015-11-04 NOTE — Patient Instructions (Addendum)
Your procedure is scheduled on: Thursday 11/07/15 Report to Day Surgery. 2ND FLOOR MEDICAL MALL ENTRANCE To find out your arrival time please call [phone] between 1PM - 3PM on Wednesday 11/06/15.  Remember: Instructions that are not followed completely may result in serious medical risk, up to and including death, or upon the discretion of your surgeon and anesthesiologist your surgery may need to be rescheduled.    __X__ 1. Do not eat food or drink liquids after midnight. No gum chewing or hard candies.     __X__ 2. No Alcohol for 24 hours before or after surgery.   ____ 3. Bring all medications with you on the day of surgery if instructed.    __X__ 4. Notify your doctor if there is any change in your medical condition     (cold, fever, infections).     Do not wear jewelry, make-up, hairpins, clips or nail polish.  Do not wear lotions, powders, or perfumes.   Do not shave 48 hours prior to surgery. Men may shave face and neck.  Do not bring valuables to the hospital.    [facility] is not responsible for any belongings or valuables.               Contacts, dentures or bridgework may not be worn into surgery.  Leave your suitcase in the car. After surgery it may be brought to your room.  For patients admitted to the hospital, discharge time is determined by your                treatment team.   Patients discharged the day of surgery will not be allowed to drive home.   Please read over the following fact sheets that you were given:   Surgical Site Infection Prevention   ____ Take these medicines the morning of surgery with A SIP OF WATER:    1. NONE  2.   3.   4.  5.  6.  ____ Fleet Enema (as directed)   ____ Use CHG Soap as directed  ____ Use inhalers on the day of surgery  ____ Stop metformin 2 days prior to surgery    ____ Take 1/2 of usual insulin dose the night before surgery and none on the morning of surgery.   __X__ Stop Coumadin/Plavix/aspirin on ASPIRIN  STOPPED   __X__ Stop Anti-inflammatories on NO IBUPROFEN ADVIL OR ALEVE MAY USE TYLENOL FOR PAIN   __X__ Stop supplements until after surgery.    ____ Bring C-Pap to the hospital.

## 2015-11-05 NOTE — Pre-Procedure Instructions (Signed)
Hgb and Met B results sent to Dr. Carmin Richmond and Anesthesia for review.

## 2015-11-06 ENCOUNTER — Ambulatory Visit: Payer: Medicare Other

## 2015-11-07 ENCOUNTER — Ambulatory Visit: Payer: Medicare Other | Admitting: Registered Nurse

## 2015-11-07 ENCOUNTER — Encounter
Admission: RE | Disposition: A | Discharge status: Home / Self Care | Payer: Self-pay | Source: Ambulatory Visit | Attending: Otolaryngology

## 2015-11-07 ENCOUNTER — Ambulatory Visit: Payer: Medicare Other

## 2015-11-07 ENCOUNTER — Encounter: Payer: Self-pay | Admitting: *Deleted

## 2015-11-07 ENCOUNTER — Ambulatory Visit
Admission: RE | Admit: 2015-11-07 | Discharge: 2015-11-07 | Disposition: A | Discharge status: Home / Self Care | Payer: Medicare Other | Source: Ambulatory Visit | Attending: Otolaryngology | Admitting: Otolaryngology

## 2015-11-07 DIAGNOSIS — I9581 Postprocedural hypotension: Secondary | ICD-10-CM | POA: Insufficient documentation

## 2015-11-07 DIAGNOSIS — I25119 Atherosclerotic heart disease of native coronary artery with unspecified angina pectoris: Secondary | ICD-10-CM | POA: Insufficient documentation

## 2015-11-07 DIAGNOSIS — H6982 Other specified disorders of Eustachian tube, left ear: Secondary | ICD-10-CM | POA: Insufficient documentation

## 2015-11-07 DIAGNOSIS — E119 Type 2 diabetes mellitus without complications: Secondary | ICD-10-CM | POA: Insufficient documentation

## 2015-11-07 DIAGNOSIS — R7981 Abnormal blood-gas level: Secondary | ICD-10-CM

## 2015-11-07 DIAGNOSIS — Z01818 Encounter for other preprocedural examination: Secondary | ICD-10-CM

## 2015-11-07 DIAGNOSIS — J392 Other diseases of pharynx: Secondary | ICD-10-CM | POA: Insufficient documentation

## 2015-11-07 DIAGNOSIS — I4581 Long QT syndrome: Secondary | ICD-10-CM | POA: Insufficient documentation

## 2015-11-07 DIAGNOSIS — R51 Headache: Secondary | ICD-10-CM | POA: Insufficient documentation

## 2015-11-07 DIAGNOSIS — Z85820 Personal history of malignant melanoma of skin: Secondary | ICD-10-CM | POA: Insufficient documentation

## 2015-11-07 DIAGNOSIS — J00 Acute nasopharyngitis [common cold]: Secondary | ICD-10-CM | POA: Insufficient documentation

## 2015-11-07 DIAGNOSIS — Z887 Allergy status to serum and vaccine status: Secondary | ICD-10-CM | POA: Insufficient documentation

## 2015-11-07 DIAGNOSIS — Z888 Allergy status to other drugs, medicaments and biological substances status: Secondary | ICD-10-CM | POA: Insufficient documentation

## 2015-11-07 DIAGNOSIS — I252 Old myocardial infarction: Secondary | ICD-10-CM | POA: Insufficient documentation

## 2015-11-07 DIAGNOSIS — I10 Essential (primary) hypertension: Secondary | ICD-10-CM | POA: Insufficient documentation

## 2015-11-07 DIAGNOSIS — F329 Major depressive disorder, single episode, unspecified: Secondary | ICD-10-CM | POA: Insufficient documentation

## 2015-11-07 DIAGNOSIS — Z801 Family history of malignant neoplasm of trachea, bronchus and lung: Secondary | ICD-10-CM | POA: Insufficient documentation

## 2015-11-07 DIAGNOSIS — Z7982 Long term (current) use of aspirin: Secondary | ICD-10-CM | POA: Insufficient documentation

## 2015-11-07 DIAGNOSIS — Z90710 Acquired absence of both cervix and uterus: Secondary | ICD-10-CM | POA: Insufficient documentation

## 2015-11-07 DIAGNOSIS — J9589 Other postprocedural complications and disorders of respiratory system, not elsewhere classified: Secondary | ICD-10-CM | POA: Insufficient documentation

## 2015-11-07 DIAGNOSIS — Z794 Long term (current) use of insulin: Secondary | ICD-10-CM | POA: Insufficient documentation

## 2015-11-07 DIAGNOSIS — Z79899 Other long term (current) drug therapy: Secondary | ICD-10-CM | POA: Insufficient documentation

## 2015-11-07 DIAGNOSIS — Z955 Presence of coronary angioplasty implant and graft: Secondary | ICD-10-CM | POA: Insufficient documentation

## 2015-11-07 DIAGNOSIS — Z87891 Personal history of nicotine dependence: Secondary | ICD-10-CM | POA: Insufficient documentation

## 2015-11-07 DIAGNOSIS — Z885 Allergy status to narcotic agent status: Secondary | ICD-10-CM | POA: Insufficient documentation

## 2015-11-07 DIAGNOSIS — Z882 Allergy status to sulfonamides status: Secondary | ICD-10-CM | POA: Insufficient documentation

## 2015-11-07 HISTORY — PX: NASOPHARYNGEAL BIOPSY: SHX6488

## 2015-11-07 LAB — GLUCOSE, CAPILLARY
GLUCOSE-CAPILLARY: 305 mg/dL — AB (ref 65–99)
GLUCOSE-CAPILLARY: 277 mg/dL — AB (ref 65–99)
GLUCOSE-CAPILLARY: 208 mg/dL — AB (ref 65–99)
GLUCOSE-CAPILLARY: 231 mg/dL — AB (ref 65–99)

## 2015-11-07 SURGERY — BIOPSY, NASOPHARYNX
Anesthesia: General

## 2015-11-07 MED ORDER — PHENYLEPHRINE HCL 10 MG/ML IJ SOLN
INTRAMUSCULAR | Status: DC | PRN
  Administered 2015-11-07: 200 ug via INTRAVENOUS
  Administered 2015-11-07 (×2): 100 ug via INTRAVENOUS
  Administered 2015-11-07: 200 ug via INTRAVENOUS
  Administered 2015-11-07 (×2): 100 ug via INTRAVENOUS
  Administered 2015-11-07: 200 ug via INTRAVENOUS

## 2015-11-07 MED ORDER — LIDOCAINE-EPINEPHRINE 1 %-1:100000 IJ SOLN
INTRAMUSCULAR | Status: DC | PRN
  Administered 2015-11-07: 5 mL

## 2015-11-07 MED ORDER — TRAMADOL HCL 50 MG PO TABS: 50.0000 mg | ORAL_TABLET | Freq: Four times a day (QID) | ORAL | 0 refills | Status: DC | PRN

## 2015-11-07 MED ORDER — LIDOCAINE HCL (CARDIAC) 20 MG/ML IV SOLN
INTRAVENOUS | Status: DC | PRN
  Administered 2015-11-07: 80 mg via INTRAVENOUS

## 2015-11-07 MED ORDER — ONDANSETRON HCL 4 MG/2ML IJ SOLN: 4.0000 mg | Freq: Once | INTRAMUSCULAR | Status: DC | PRN

## 2015-11-07 MED ORDER — PROPOFOL 10 MG/ML IV BOLUS
INTRAVENOUS | Status: DC | PRN
  Administered 2015-11-07: 50 mg via INTRAVENOUS
  Administered 2015-11-07: 40 mg via INTRAVENOUS
  Administered 2015-11-07: 100 mg via INTRAVENOUS

## 2015-11-07 MED ORDER — FENTANYL CITRATE (PF) 100 MCG/2ML IJ SOLN: 25.0000 ug | INTRAMUSCULAR | Status: DC | PRN

## 2015-11-07 MED ORDER — DEXAMETHASONE SODIUM PHOSPHATE 10 MG/ML IJ SOLN
INTRAMUSCULAR | Status: DC | PRN
  Administered 2015-11-07: 10 mg via INTRAVENOUS

## 2015-11-07 MED ORDER — BACITRACIN ZINC 500 UNIT/GM EX OINT
TOPICAL_OINTMENT | CUTANEOUS | Status: AC
  Filled 2015-11-07: qty 0.9

## 2015-11-07 MED ORDER — OXYMETAZOLINE HCL 0.05 % NA SOLN
NASAL | Status: DC | PRN
  Administered 2015-11-07: 2 via TOPICAL

## 2015-11-07 MED ORDER — SODIUM CHLORIDE 0.9 % IV SOLN
INTRAVENOUS | Status: DC
  Administered 2015-11-07 (×2): via INTRAVENOUS

## 2015-11-07 MED ORDER — AMOXICILLIN-POT CLAVULANATE 875-125 MG PO TABS: 1.0000 | ORAL_TABLET | Freq: Two times a day (BID) | ORAL | 0 refills | Status: DC

## 2015-11-07 MED ORDER — INSULIN ASPART 100 UNIT/ML ~~LOC~~ SOLN
SUBCUTANEOUS | Status: AC
Start: 2015-11-07 — End: 2015-11-07
  Administered 2015-11-07: 5 [IU] via SUBCUTANEOUS
  Filled 2015-11-07: qty 5

## 2015-11-07 MED ORDER — FUROSEMIDE 10 MG/ML IJ SOLN
INTRAMUSCULAR | Status: AC
  Filled 2015-11-07: qty 2

## 2015-11-07 MED ORDER — MIDAZOLAM HCL 2 MG/2ML IJ SOLN
INTRAMUSCULAR | Status: DC | PRN
  Administered 2015-11-07: 2 mg via INTRAVENOUS

## 2015-11-07 MED ORDER — FAMOTIDINE 20 MG PO TABS
ORAL_TABLET | ORAL | Status: AC
  Administered 2015-11-07: 20 mg via ORAL
  Filled 2015-11-07: qty 1

## 2015-11-07 MED ORDER — SODIUM CHLORIDE 0.9 % IV BOLUS (SEPSIS): 1000.0000 mL | Freq: Once | INTRAVENOUS | Status: DC

## 2015-11-07 MED ORDER — OXYMETAZOLINE HCL 0.05 % NA SOLN
NASAL | Status: AC
  Filled 2015-11-07: qty 15

## 2015-11-07 MED ORDER — INSULIN ASPART 100 UNIT/ML ~~LOC~~ SOLN
5.0000 [IU] | Freq: Once | SUBCUTANEOUS | Status: AC
  Administered 2015-11-07: 5 [IU] via SUBCUTANEOUS

## 2015-11-07 MED ORDER — FAMOTIDINE 20 MG PO TABS
20.0000 mg | ORAL_TABLET | Freq: Once | ORAL | Status: AC
  Administered 2015-11-07: 20 mg via ORAL

## 2015-11-07 MED ORDER — GLYCOPYRROLATE 0.2 MG/ML IJ SOLN
INTRAMUSCULAR | Status: DC | PRN
  Administered 2015-11-07: 0.2 mg via INTRAVENOUS

## 2015-11-07 MED ORDER — FENTANYL CITRATE (PF) 100 MCG/2ML IJ SOLN
INTRAMUSCULAR | Status: DC | PRN
  Administered 2015-11-07: 25 ug via INTRAVENOUS
  Administered 2015-11-07 (×2): 50 ug via INTRAVENOUS
  Administered 2015-11-07: 25 ug via INTRAVENOUS

## 2015-11-07 MED ORDER — ONDANSETRON HCL 4 MG/2ML IJ SOLN
INTRAMUSCULAR | Status: DC | PRN
Start: 2015-11-07 — End: 2015-11-07
  Administered 2015-11-07: 4 mg via INTRAVENOUS

## 2015-11-07 MED ORDER — SUGAMMADEX SODIUM 200 MG/2ML IV SOLN
INTRAVENOUS | Status: DC | PRN
  Administered 2015-11-07: 125 mg via INTRAVENOUS

## 2015-11-07 MED ORDER — ACETAMINOPHEN 10 MG/ML IV SOLN
INTRAVENOUS | Status: AC
  Filled 2015-11-07: qty 100

## 2015-11-07 MED ORDER — IPRATROPIUM-ALBUTEROL 0.5-2.5 (3) MG/3ML IN SOLN
3.0000 mL | RESPIRATORY_TRACT | Status: DC
  Administered 2015-11-07: 3 mL via RESPIRATORY_TRACT

## 2015-11-07 MED ORDER — ACETAMINOPHEN 10 MG/ML IV SOLN
INTRAVENOUS | Status: DC | PRN
  Administered 2015-11-07: 1000 mg via INTRAVENOUS

## 2015-11-07 MED ORDER — IPRATROPIUM-ALBUTEROL 0.5-2.5 (3) MG/3ML IN SOLN
RESPIRATORY_TRACT | Status: DC
Start: 2015-11-07 — End: 2015-11-07
  Filled 2015-11-07: qty 3

## 2015-11-07 MED ORDER — ROCURONIUM BROMIDE 100 MG/10ML IV SOLN
INTRAVENOUS | Status: DC | PRN
  Administered 2015-11-07: 10 mg via INTRAVENOUS
  Administered 2015-11-07: 20 mg via INTRAVENOUS

## 2015-11-07 MED ORDER — LIDOCAINE-EPINEPHRINE 1 %-1:100000 IJ SOLN
INTRAMUSCULAR | Status: AC
  Filled 2015-11-07: qty 1

## 2015-11-07 MED ORDER — FUROSEMIDE 10 MG/ML IJ SOLN
20.0000 mg | Freq: Once | INTRAMUSCULAR | Status: AC
  Administered 2015-11-07: 20 mg via INTRAVENOUS

## 2015-11-07 SURGICAL SUPPLY — 39 items
BATTERY INSTRU NAVIGATION (MISCELLANEOUS) IMPLANT
CANISTER SUC SOCK COL 7IN (MISCELLANEOUS) IMPLANT
CANISTER SUCT 1200ML W/VALVE (MISCELLANEOUS) ×3 IMPLANT
CANISTER SUCT 3000ML (MISCELLANEOUS) IMPLANT
CNTNR SPEC 2.5X3XGRAD LEK (MISCELLANEOUS) ×2
COAG SUCT 10F 3.5MM HAND CTRL (MISCELLANEOUS) ×3 IMPLANT
CONT SPEC 4OZ STER OR WHT (MISCELLANEOUS) ×4
CONTAINER SPEC 2.5X3XGRAD LEK (MISCELLANEOUS) ×2 IMPLANT
DRESSING NASL FOAM PST OP SINU (MISCELLANEOUS) IMPLANT
DRSG NASAL 4CM NASOPORE (MISCELLANEOUS) IMPLANT
DRSG NASAL FOAM POST OP SINU (MISCELLANEOUS)
ELECT REM PT RETURN 9FT ADLT (ELECTROSURGICAL) ×3
ELECTRODE REM PT RTRN 9FT ADLT (ELECTROSURGICAL) ×1 IMPLANT
GAUZE PACK 2X3YD (MISCELLANEOUS) ×3 IMPLANT
GLOVE BIO SURGEON STRL SZ7.5 (GLOVE) ×6 IMPLANT
GOWN STRL REUS W/ TWL LRG LVL3 (GOWN DISPOSABLE) ×2 IMPLANT
GOWN STRL REUS W/TWL LRG LVL3 (GOWN DISPOSABLE) ×4
IRRIGATOR 4MM STR (IRRIGATION / IRRIGATOR) ×3 IMPLANT
IV NS 1000ML (IV SOLUTION) ×2
IV NS 1000ML BAXH (IV SOLUTION) ×1 IMPLANT
KIT RM TURNOVER STRD PROC AR (KITS) ×3 IMPLANT
NAVIGATION MASK REG  ST (MISCELLANEOUS) IMPLANT
NS IRRIG 500ML POUR BTL (IV SOLUTION) ×3 IMPLANT
PACK HEAD/NECK (MISCELLANEOUS) ×3 IMPLANT
PACKING NASAL EPIS 4X2.4 XEROG (MISCELLANEOUS) IMPLANT
PATTIES SURGICAL .5 X3 (DISPOSABLE) ×6 IMPLANT
SET HANDPIECE IRR DIEGO (MISCELLANEOUS) IMPLANT
SOL ANTI-FOG 6CC FOG-OUT (MISCELLANEOUS) ×1 IMPLANT
SOL FOG-OUT ANTI-FOG 6CC (MISCELLANEOUS) ×2
SPLINT NASAL REUTER (MISCELLANEOUS) IMPLANT
SPLINT NASAL REUTER .5MM (MISCELLANEOUS) IMPLANT
SUT CHROMIC 4 0 RB 1X27 (SUTURE) ×3 IMPLANT
SUT ETHILON 3 0 PS 1 (SUTURE) ×3 IMPLANT
SWAB CULTURE AMIES ANAERIB BLU (MISCELLANEOUS) ×3 IMPLANT
SYR 30ML LL (SYRINGE) ×3 IMPLANT
TRAP SPECIMEN MUCOUS 40CC (MISCELLANEOUS) IMPLANT
TUBING CONNECTING 10 (TUBING) ×2 IMPLANT
TUBING CONNECTING 10' (TUBING) ×1
WATER STERILE IRR 1000ML POUR (IV SOLUTION) IMPLANT

## 2015-11-07 NOTE — Anesthesia Procedure Notes (Signed)
Procedure Name: Intubation Date/Time: 11/07/2015 10:34 AM Performed by: Doreen Salvage Pre-anesthesia Checklist: Patient identified, Patient being monitored, Timeout performed, Emergency Drugs available and Suction available Patient Re-evaluated:Patient Re-evaluated prior to inductionOxygen Delivery Method: Circle system utilized Preoxygenation: Pre-oxygenation with 100% oxygen Intubation Type: IV induction Ventilation: Mask ventilation without difficulty Laryngoscope Size: Mac and 3 Grade View: Grade II Tube type: Oral Rae Tube size: 7.0 mm Number of attempts: 1 Airway Equipment and Method: Stylet Placement Confirmation: ETT inserted through vocal cords under direct vision,  positive ETCO2 and breath sounds checked- equal and bilateral Secured at: 21 cm Tube secured with: Tape Dental Injury: Teeth and Oropharynx as per pre-operative assessment

## 2015-11-07 NOTE — Anesthesia Preprocedure Evaluation (Signed)
Anesthesia Evaluation  Patient identified by MRN, date of birth, ID band Patient awake    Reviewed: Allergy & Precautions, NPO status , Patient's Chart, lab work & pertinent test results, reviewed documented beta blocker date and time   Airway Mallampati: III  TM Distance: >3 FB     Dental  (+) Caps   Pulmonary former smoker,    Pulmonary exam normal        Cardiovascular hypertension, Pt. on medications and Pt. on home beta blockers + angina with exertion + CAD and + Past MI  Normal cardiovascular exam     Neuro/Psych  Headaches, PSYCHIATRIC DISORDERS Depression    GI/Hepatic negative GI ROS, Neg liver ROS,   Endo/Other  diabetes, Well Controlled, Type 2, Oral Hypoglycemic Agents, Insulin Dependent  Renal/GU negative Renal ROS     Musculoskeletal negative musculoskeletal ROS (+)   Abdominal Normal abdominal exam  (+)   Peds negative pediatric ROS (+)  Hematology negative hematology ROS (+)   Anesthesia Other Findings   Reproductive/Obstetrics                             Anesthesia Physical Anesthesia Plan  ASA: III  Anesthesia Plan: General   Post-op Pain Management:    Induction: Intravenous  Airway Management Planned: Oral ETT  Additional Equipment:   Intra-op Plan:   Post-operative Plan: Extubation in OR  Informed Consent: I have reviewed the patients History and Physical, chart, labs and discussed the procedure including the risks, benefits and alternatives for the proposed anesthesia with the patient or authorized representative who has indicated his/her understanding and acceptance.   Dental advisory given  Plan Discussed with: CRNA and Surgeon  Anesthesia Plan Comments:         Anesthesia Quick Evaluation

## 2015-11-07 NOTE — Anesthesia Postprocedure Evaluation (Signed)
Anesthesia Post Note  Patient: TASHONNA SILLIMAN  Procedure(s) Performed: Procedure(s) (LRB): NASOPHARYNGEAL BIOPSY (N/A)  Patient location during evaluation: PACU Anesthesia Type: General Level of consciousness: awake and alert and oriented Pain management: pain level controlled Vital Signs Assessment: post-procedure vital signs reviewed and stable Respiratory status: spontaneous breathing Cardiovascular status: blood pressure returned to baseline Comments: Patient bit down on the ETT at the end of the case with a transient drop in sats of less than 10 seconds.  PACU Chest Xray showed only mild possible atelectasis in the perihilar region that was not clinically apparent as chest was clear to auscultation in all areas at end of PACU stay.  Initial auscultation revealed some minimal crackles in left base that cleared with Lasix .Marland Kitchen  12 Lead EKG showed no change from prior EKG.  Sats in  Mid to upper 90s on room air at the end of the PACU stay.    Last Vitals:  Vitals:   11/07/15 1445 11/07/15 1455  BP:  (!) 99/50  Pulse: (!) 114 (!) 115  Resp: 18 15  Temp:  37.1 C    Last Pain:  Vitals:   11/07/15 1407  TempSrc:   PainSc: Asleep                 Benita Boonstra

## 2015-11-07 NOTE — H&P (Signed)
..  History and Physical paper copy reviewed and updated date of procedure and will be scanned into system.  

## 2015-11-07 NOTE — Transfer of Care (Signed)
Immediate Anesthesia Transfer of Care Note  Patient: Diana Frost  Procedure(s) Performed: Procedure(s): NASOPHARYNGEAL BIOPSY (N/A)  Patient Location: PACU  Anesthesia Type:General  Level of Consciousness: sedated  Airway & Oxygen Therapy: Patient Spontanous Breathing and Patient connected to face mask oxygen  Post-op Assessment: Report given to RN and Post -op Vital signs reviewed and stable  Post vital signs: Reviewed and stable  Last Vitals:  Vitals:   11/07/15 1210 11/07/15 1215  BP: (!) 89/53   Pulse: 93 95  Resp: 20 18  Temp: [id number] C     Complications: No apparent anesthesia complications

## 2015-11-07 NOTE — Op Note (Signed)
..  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  Frozen sections revealed inflammation and salivary glandular tissue.  Biopsies taken through cartilage of left torus tubarius as well as down to muscle of the posterior nasopharynx.  No discrete mass or lesion identified despite lesion on MRI scan.  Procedure: After the patient was identified in holding and the benefits of the procedure were reviewed as well as the consent and risks.  The patient was taken to the operating room and with the patient in a comfortable supine position,  general orotracheal anesthesia was induced without difficulty.  A proper time-out was performed.  The patient next received preoperative Afrin spray for topical decongestion and vasoconstriction.  Several minutes were allowed for this to take effect.  5 cc's of 1% lidocaine with 1:100,000 epinephrine was injected into the patient's inferior turbinates and septum.;  The left side of the nasal cavity was next inspected with a zero degree endoscope back to the nasopharynx.  This demonstrated mild fullness of the torus tubarius and fossa of Rosenmueller.  Multiple biopsies were taken down and through the cartilage along the posterior and superior torus tubarius along with posterior to the eustachian tube in the fossa of Rosenmueller.  These were sent for frozen section diagnosis.  This revealed inflammation and salivary type glands.    At this time at 30 degree scope was brought onto the field and multiple biopsies using a 45 degree cup forcep was used for additional biopsies.  This  resulted in the majority of the superior and posterior torus tubarius to be debrided along with the posterior nasopharygeal  Mucosa removed down to muscle.  These additional biopsies were sent to pathology for IHC staining.    Hemostasis was achieved at this time with topical Afrin and Bovie suction cautery.  Visualization revealed no additional areas of concern and therefore the patient was awakened from general anesthesia.  Care of the patient at this time was transferred to [place].  Dispo:   PACU  Plan: Elevation, narcotic analgesia and prophylactic antibiotics. Follow pathology for diagnosis.  Tekela Garguilo 11/07/2015 11:35 AM

## 2015-11-07 NOTE — Discharge Instructions (Signed)
AMBULATORY SURGERY  °DISCHARGE INSTRUCTIONS ° ° °1) The drugs that you were given will stay in your system until tomorrow so for the next 24 hours you should not: ° °A) Drive an automobile °B) Make any legal decisions °C) Drink any alcoholic beverage ° ° °2) You may resume regular meals tomorrow.  Today it is better to start with liquids and gradually work up to solid foods. ° °You may eat anything you prefer, but it is better to start with liquids, then soup and crackers, and gradually work up to solid foods. ° ° °3) Please notify your doctor immediately if you have any unusual bleeding, trouble breathing, redness and pain at the surgery site, drainage, fever, or pain not relieved by medication. ° ° ° °4) Additional Instructions: ° ° ° ° ° ° ° °Please contact your physician with any problems or Same Day Surgery at [phone], Monday through Friday 6 am to 4 pm, or [place] [facility] [place] Main number at [phone]. °

## 2015-11-07 NOTE — Consult Note (Signed)
[facility] [place] NAME: Diana Frost    MR#:  [id number]  DATE OF BIRTH:  [birth year]  DATE OF ADMISSION:  11/07/2015  PRIMARY CARE PHYSICIAN: WHITE, Orlene Och, NP   REQUESTING/REFERRING PHYSICIAN: pacu  CHIEF COMPLAINT:  Low bp  HISTORY OF PRESENT ILLNESS:  Diana Frost  is a 70 y.o. female with a known history of cad s/p pci and stent placement a handful of years ago, essential hypertension who presented today for routine Endoscopic Nasopharyngeal Biopsy - Biopsies taken through cartilage of left torus tubarius as well as down to muscle of the posterior nasopharynx.  No discrete mass or lesion identified despite lesion on MRI scan. Regardless, called to evaluate the patient for postoperative hypotension and desaturation. Her initial BP was in the 80s, however, currently 101/69 99% RA. Patient denies any complaints. She does mention having symptoms of orthostasis as an outpatient for a few weeks.   PAST MEDICAL HISTORY:   Past Medical History:  Diagnosis Date  . Coronary artery disease   . Diabetes mellitus without complication (HCC)    insulin pump  . Headache    since ear problem started  . Hypertension   . Myocardial infarction ([place]) 11/01/13    PAST SURGICAL HISTOIRY:   Past Surgical History:  Procedure Laterality Date  . ABDOMINAL HYSTERECTOMY    . BACK SURGERY     tail bone removed after fall/fracture  . CATARACT EXTRACTION BILATERAL W/ ANTERIOR VITRECTOMY    . CORONARY ANGIOPLASTY WITH STENT PLACEMENT  11/01/13   [facility], stent x2  . MELANOMA EXCISION Right    arm with skin graft  . MYRINGOTOMY WITH TUBE PLACEMENT Left 08/21/2015   Procedure: MYRINGOTOMY WITH TUBE PLACEMENT;  Surgeon: Carloyn Manner, MD;  Location: [facility];  Service: ENT;  Laterality: Left;  . TONSILLECTOMY      SOCIAL HISTORY:   Social History  Substance Use Topics  . Smoking status: Former Smoker    Types:  Cigarettes    Quit date: 04/30/1998  . Smokeless tobacco: Never Used  . Alcohol use 10.2 oz/week    17 Glasses of wine per week     Comment:      FAMILY HISTORY:  History reviewed. No pertinent family history.  DRUG ALLERGIES:   Allergies  Allergen Reactions  . Influenza Vaccines Shortness Of Breath  . Percocet [Oxycodone-Acetaminophen] Nausea And Vomiting  . Statins     Joint pain  . Sulfa Antibiotics Nausea And Vomiting    REVIEW OF SYSTEMS:  CONSTITUTIONAL: No fever, weakness. "just tired" EYES: No blurred or double vision.  EARS, NOSE, AND THROAT: No tinnitus or ear pain.  RESPIRATORY: No cough, shortness of breath, wheezing or hemoptysis.  CARDIOVASCULAR: No chest pain, orthopnea, edema.  GASTROINTESTINAL: No nausea, vomiting, diarrhea or abdominal pain.  GENITOURINARY: No dysuria, hematuria.  ENDOCRINE: No polyuria, nocturia,  HEMATOLOGY: No anemia, easy bruising or bleeding SKIN: No rash or lesion. MUSCULOSKELETAL: No joint pain or arthritis.   NEUROLOGIC: No tingling, numbness, weakness.  PSYCHIATRY: No anxiety or depression.   MEDICATIONS AT HOME:   Prior to Admission medications   Medication Sig Start Date End Date Taking? Authorizing Provider  aspirin 81 MG tablet Take 81 mg by mouth daily.   Yes Historical Provider, MD  FLUoxetine (PROZAC) 40 MG capsule Take 40 mg by mouth daily.   Yes Historical Provider, MD  Glucagon, rDNA, (GLUCAGON EMERGENCY IJ) Inject as directed as needed (for low blood  sugar).   Yes Historical Provider, MD  insulin glargine (LANTUS) 100 UNIT/ML injection Inject 13 Units into the skin daily. As needed if insulin pump is not working    Yes Historical Provider, MD  insulin lispro (HUMALOG) 100 UNIT/ML injection Inject 13 Units into the skin over 24 hr. In insulin pump    Yes Historical Provider, MD  losartan (COZAAR) 25 MG tablet Take 25 mg by mouth daily.   Yes Historical Provider, MD  Magnesium 500 MG TABS Take 500 mg by mouth daily.     Yes Historical Provider, MD  Melatonin 5 MG TABS Take 10 mg by mouth at bedtime as needed (sleep).    Yes Historical Provider, MD  metoprolol tartrate (LOPRESSOR) 25 MG tablet Take 12.5 mg by mouth daily.    Yes Historical Provider, MD  Multiple Vitamin (MULTIVITAMIN) capsule Take 1 capsule by mouth daily.   Yes Historical Provider, MD  ondansetron (ZOFRAN-ODT) 4 MG disintegrating tablet Take 4 mg by mouth every 8 (eight) hours as needed for nausea or vomiting.   Yes Historical Provider, MD  rosuvastatin (CRESTOR) 10 MG tablet Take 10 mg by mouth daily.   Yes Historical Provider, MD  amoxicillin-clavulanate (AUGMENTIN) 875-125 MG tablet Take 1 tablet by mouth 2 (two) times daily. 11/07/15   Carloyn Manner, MD  ibuprofen (ADVIL,MOTRIN) 800 MG tablet Take 1 tablet (800 mg total) by mouth every 8 (eight) hours as needed for moderate pain. Patient not taking: Reported on 11/01/2015 02/07/15   Sable Feil, PA-C  traMADol (ULTRAM) 50 MG tablet Take 1 tablet (50 mg total) by mouth every 6 (six) hours as needed. 11/07/15   Carloyn Manner, MD      VITAL SIGNS:  Blood pressure 119/69, pulse (!) 124, temperature 98.5 F (36.9 C), resp. rate 19, height 5\' 2"  (1.575 m), weight 123 lb (55.8 kg), SpO2 92 %.  PHYSICAL EXAMINATION:  GENERAL:  70 y.o.-year-old patient lying in the bed with no acute distress.  EYES: Pupils equal, round, reactive to light and accommodation. No scleral icterus. Extraocular muscles intact.  HEENT: Head atraumatic, normocephalic. Oropharynx and nasopharynx clear.  NECK:  Supple, no jugular venous distention. No thyroid enlargement, no tenderness.  LUNGS: scant rhonchi otherwise Normal breath sounds bilaterally, no wheezing, rales,rhonchi or crepitation. No use of accessory muscles of respiration.  CARDIOVASCULAR: S1, S2 normal. No murmurs, rubs, or gallops.  ABDOMEN: Soft, nontender, nondistended. Bowel sounds present. No organomegaly or mass.  EXTREMITIES: No pedal edema,  cyanosis, or clubbing.  NEUROLOGIC: Cranial nerves II through XII are intact. Muscle strength 5/5 in all extremities. Sensation intact. Gait not checked.  PSYCHIATRIC: The patient is alert and oriented x 3.  SKIN: No obvious rash, lesion, or ulcer.   LABORATORY PANEL:   CBC  Recent Labs Lab 11/04/15 1519  HGB 11.8*   ------------------------------------------------------------------------------------------------------------------  Chemistries   Recent Labs Lab 11/04/15 1519  NA 134*  K 3.8  CL 96*  CO2 28  GLUCOSE 299*  BUN 17  CREATININE 1.08*  CALCIUM 8.8*   ------------------------------------------------------------------------------------------------------------------  Cardiac Enzymes No results for input(s): TROPONINI in the last 168 hours. ------------------------------------------------------------------------------------------------------------------  RADIOLOGY:  No results found.  EKG:   Orders placed or performed during the hospital encounter of 11/07/15  . EKG 12-Lead  . EKG 12-Lead    IMPRESSION AND PLAN:   70 year old female evaluated in PACU for hypotension/desat after routine Endoscopic Nasopharyngeal Biopsy   1. Post operative hypotension: seemingly corrected - I suspect this is combination  of relatively low baseline BP (orthostasis symptoms) and being immediately post op and dosage of lasix. EKG and CXR personally reviewed at bedside without acute abnormality. With that, I am fairly comfortable in her ability to be discharged as previously planned -- however, if bp returns to be a problem feel free to call me back for reassessment  2. Postoperative hypoxia: improved without intervention 3. Post op Endoscopic Nasopharyngeal Biopsy 4. htn essential: would reevaluate home meds as outpatient basis  Thank you for the opportunity to participate in her care -- if there is further decline/concern feel free to recall     All the records are  reviewed and case discussed with Consulting provider. Management plans discussed with the patient, family and they are in agreement.  CODE STATUS: full  TOTAL TIME TAKING CARE OF THIS PATIENT: 45 minutes.    Hower,  Karenann Cai.D on 11/07/2015 at 2:36 PM  Between 7am to 6pm - Pager - [phone]  After 6pm: House Pager: [phone]  [facility]  Office  [phone]  CC: Primary care Physician: WHITE, Orlene Och, NP

## 2015-11-11 ENCOUNTER — Ambulatory Visit: Payer: Medicare Other

## 2015-11-13 ENCOUNTER — Ambulatory Visit: Payer: Medicare Other

## 2015-11-14 ENCOUNTER — Ambulatory Visit: Payer: Medicare Other

## 2015-11-14 DIAGNOSIS — I208 Other forms of angina pectoris: Secondary | ICD-10-CM

## 2015-11-18 ENCOUNTER — Ambulatory Visit: Payer: Medicare Other

## 2015-11-20 ENCOUNTER — Encounter: Payer: Self-pay | Admitting: *Deleted

## 2015-11-20 ENCOUNTER — Ambulatory Visit: Payer: Medicare Other

## 2015-11-20 DIAGNOSIS — I208 Other forms of angina pectoris: Secondary | ICD-10-CM

## 2015-11-20 NOTE — Progress Notes (Signed)
Cardiac Individual Treatment Plan  Patient Details  [name] MRN: [id number] Date of Birth: 03/07/1946 Referring Provider:   Flowsheet Row Cardiac Rehab from 10/30/2015 in [facility] Cardiac and Pulmonary Rehab  Referring Provider  Sheppard Coil      Initial Encounter Date:  Flowsheet Row Cardiac Rehab from 10/30/2015 in [facility] Cardiac and Pulmonary Rehab  Date  10/30/15  Referring Provider  Sheppard Coil      Visit Diagnosis: Stable angina [facility])  Patient's Home Medications on Admission:  Current Outpatient Prescriptions:  .  amoxicillin-clavulanate (AUGMENTIN) 875-125 MG tablet, Take 1 tablet by mouth 2 (two) times daily., Disp: 20 tablet, Rfl: 0 .  aspirin 81 MG tablet, Take 81 mg by mouth daily., Disp: , Rfl:  .  FLUoxetine (PROZAC) 40 MG capsule, Take 40 mg by mouth daily., Disp: , Rfl:  .  Glucagon, rDNA, (GLUCAGON EMERGENCY IJ), Inject as directed as needed (for low blood sugar)., Disp: , Rfl:  .  insulin glargine (LANTUS) 100 UNIT/ML injection, Inject 13 Units into the skin daily. As needed if insulin pump is not working , Disp: , Rfl:  .  insulin lispro (HUMALOG) 100 UNIT/ML injection, Inject 13 Units into the skin over 24 hr. In insulin pump , Disp: , Rfl:  .  losartan (COZAAR) 25 MG tablet, Take 25 mg by mouth daily., Disp: , Rfl:  .  Magnesium 500 MG TABS, Take 500 mg by mouth daily. , Disp: , Rfl:  .  Melatonin 5 MG TABS, Take 10 mg by mouth at bedtime as needed (sleep). , Disp: , Rfl:  .  metoprolol tartrate (LOPRESSOR) 25 MG tablet, Take 12.5 mg by mouth daily. , Disp: , Rfl:  .  Multiple Vitamin (MULTIVITAMIN) capsule, Take 1 capsule by mouth daily., Disp: , Rfl:  .  ondansetron (ZOFRAN-ODT) 4 MG disintegrating tablet, Take 4 mg by mouth every 8 (eight) hours as needed for nausea or vomiting., Disp: , Rfl:  .  rosuvastatin (CRESTOR) 10 MG tablet, Take 10 mg by mouth daily., Disp: , Rfl:  .  traMADol (ULTRAM) 50 MG tablet, Take 1 tablet (50 mg total) by mouth every 6  (six) hours as needed., Disp: 30 tablet, Rfl: 0  Past Medical History: Past Medical History:  Diagnosis Date  . Coronary artery disease   . Diabetes mellitus without complication (HCC)    insulin pump  . Headache    since ear problem started  . Hypertension   . Myocardial infarction ([place]) 11/01/13    Tobacco Use: History  Smoking Status  . Former Smoker  . Types: Cigarettes  . Quit date: 04/30/1998  Smokeless Tobacco  . Never Used    Labs: Recent Review Flowsheet Data    There is no flowsheet data to display.       Exercise Target Goals:    Exercise Program Goal: Individual exercise prescription set with THRR, safety & activity barriers. Participant demonstrates ability to understand and report RPE using BORG scale, to self-measure pulse accurately, and to acknowledge the importance of the exercise prescription.  Exercise Prescription Goal: Starting with aerobic activity 30 plus minutes a day, 3 days per week for initial exercise prescription. Provide home exercise prescription and guidelines that participant acknowledges understanding prior to discharge.  Activity Barriers & Risk Stratification:     Activity Barriers & Cardiac Risk Stratification - 10/30/15 1259      Activity Barriers & Cardiac Risk Stratification   Cardiac Risk Stratification High      6 Minute Walk:  Diana Frost 10/30/15 1313         6 Minute Walk   Distance 1250 feet     Walk Time 6 minutes     MPH 2.36     METS 2.77     RPE 12     Perceived Dyspnea  2     VO2 Peak 9.7     Symptoms No     Resting HR 98 bpm     Resting BP 92/60     Max Ex. HR 112 bpm     Max Ex. BP 104/58        Initial Exercise Prescription:     Initial Exercise Prescription - 10/30/15 1300      Date of Initial Exercise RX and Referring Provider   Date 10/30/15   Referring Provider Sheppard Coil     Treadmill   MPH 2   Grade 0.5   Minutes 15   METs 2.67     Recumbant Bike   Level  1   RPM 60   Watts 10   Minutes 15   METs 2.52     NuStep   Level 1   Watts 10   Minutes 15   METs 2.52     REL-XR   Level 1   Watts 30   Minutes 15   METs 2.5     Biostep-RELP   Level 1   Watts 15   Minutes 15   METs 2.5     Prescription Details   Frequency (times per week) 3   Duration Progress to 45 minutes of aerobic exercise without signs/symptoms of physical distress     Intensity   THRR 40-80% of Max Heartrate 118-139   Ratings of Perceived Exertion 11-15   Perceived Dyspnea 0-4     Progression   Progression Continue to progress workloads to maintain intensity without signs/symptoms of physical distress.     Resistance Training   Training Prescription Yes   Weight 2   Reps 10-15      Perform Capillary Blood Glucose checks as needed.  Exercise Prescription Changes:   Exercise Comments:     Exercise Comments    Row Frost 11/14/15 1527           Exercise Comments Diana Frost is waiting unti lafter she rovers from surgery to begin Heart Track.          Discharge Exercise Prescription (Final Exercise Prescription Changes):   Nutrition:  Target Goals: Understanding of nutrition guidelines, daily intake of sodium 1500mg , cholesterol 200mg , calories 30% from fat and 7% or less from saturated fats, daily to have 5 or more servings of fruits and vegetables.  Biometrics:     Pre Biometrics - 10/30/15 1310      Pre Biometrics   Height 5' 1.4" (1.56 m)   Weight 127 lb 11.2 oz (57.9 kg)   Waist Circumference 32 inches   Hip Circumference 37 inches   Waist to Hip Ratio 0.86 %   BMI (Calculated) 23.9       Nutrition Therapy Plan and Nutrition Goals:     Nutrition Therapy & Goals - 10/30/15 1304      Nutrition Therapy   Drug/Food Interactions Statins/Certain Fruits     Personal Nutrition Goals   Personal Goal #1 --  Diana Frost is a diabetic on insulin. Her fasting blood sugar reported from home today was 126.      Intervention Plan    Intervention  Prescribe, educate and counsel regarding individualized specific dietary modifications aiming towards targeted core components such as weight, hypertension, lipid management, diabetes, heart failure and other comorbidities.   Expected Outcomes Short Term Goal: Understand basic principles of dietary content, such as calories, fat, sodium, cholesterol and nutrients.;Short Term Goal: A plan has been developed with personal nutrition goals set during dietitian appointment.;Long Term Goal: Adherence to prescribed nutrition plan.      Nutrition Discharge: Rate Your Plate Scores:   Nutrition Goals Re-Evaluation:   Psychosocial: Target Goals: Acknowledge presence or absence of depression, maximize coping skills, provide positive support system. Participant is able to verbalize types and ability to use techniques and skills needed for reducing stress and depression.  Initial Review & Psychosocial Screening:     Initial Psych Review & Screening - 10/30/15 1305      Initial Review   Current issues with Current Stress Concerns   Source of Stress Concerns Chronic Illness   Comments Her husband has a chronic illness condition.      Family Dynamics   Good Support System? Yes     Barriers   Psychosocial barriers to participate in program The patient should benefit from training in stress management and relaxation.     Screening Interventions   Interventions Encouraged to exercise;Program counselor consult      Quality of Life Scores:     Quality of Life - 10/31/15 1148      Quality of Life Scores   Health/Function Pre 24 %   Socioeconomic Pre 27 %   Psych/Spiritual Pre 24.86 %   Family Pre 30 %   GLOBAL Pre 25.74 %      PHQ-9: Recent Review Flowsheet Data    Depression screen [facility] 2/9 10/30/2015   Decreased Interest 1   Down, Depressed, Hopeless 1   PHQ - 2 Score 2   Altered sleeping 1   Tired, decreased energy 2   Change in appetite 1   Feeling bad or failure  about yourself  1   Trouble concentrating 0   Moving slowly or fidgety/restless 0   Suicidal thoughts 0   PHQ-9 Score 7      Psychosocial Evaluation and Intervention:   Psychosocial Re-Evaluation:   Vocational Rehabilitation: Provide vocational rehab assistance to qualifying candidates.   Vocational Rehab Evaluation & Intervention:     Vocational Rehab - 10/30/15 1259      Initial Vocational Rehab Evaluation & Intervention   Assessment shows need for Vocational Rehabilitation No      Education: Education Goals: Education classes will be provided on a weekly basis, covering required topics. Participant will state understanding/return demonstration of topics presented.  Learning Barriers/Preferences:     Learning Barriers/Preferences - 10/31/15 1145      Learning Barriers/Preferences   Learning Barriers None   Learning Preferences None      Education Topics: General Nutrition Guidelines/Fats and Fiber: -Group instruction provided by verbal, written material, models and posters to present the general guidelines for heart healthy nutrition. Gives an explanation and review of dietary fats and fiber.   Controlling Sodium/Reading Food Labels: -Group verbal and written material supporting the discussion of sodium use in heart healthy nutrition. Review and explanation with models, verbal and written materials for utilization of the food label.   Exercise Physiology & Risk Factors: - Group verbal and written instruction with models to review the exercise physiology of the cardiovascular system and associated critical values. Details cardiovascular disease risk factors and the goals associated with  each risk factor.   Aerobic Exercise & Resistance Training: - Gives group verbal and written discussion on the health impact of inactivity. On the components of aerobic and resistive training programs and the benefits of this training and how to safely progress through these  programs.   Flexibility, Balance, General Exercise Guidelines: - Provides group verbal and written instruction on the benefits of flexibility and balance training programs. Provides general exercise guidelines with specific guidelines to those with heart or lung disease. Demonstration and skill practice provided.   Stress Management: - Provides group verbal and written instruction about the health risks of elevated stress, cause of high stress, and healthy ways to reduce stress.   Depression: - Provides group verbal and written instruction on the correlation between heart/lung disease and depressed mood, treatment options, and the stigmas associated with seeking treatment.   Anatomy & Physiology of the Heart: - Group verbal and written instruction and models provide basic cardiac anatomy and physiology, with the coronary electrical and arterial systems. Review of: AMI, Angina, Valve disease, Heart Failure, Cardiac Arrhythmia, Pacemakers, and the ICD.   Cardiac Procedures: - Group verbal and written instruction and models to describe the testing methods done to diagnose heart disease. Reviews the outcomes of the test results. Describes the treatment choices: Medical Management, Angioplasty, or Coronary Bypass Surgery.   Cardiac Medications: - Group verbal and written instruction to review commonly prescribed medications for heart disease. Reviews the medication, class of the drug, and side effects. Includes the steps to properly store meds and maintain the prescription regimen.   Go Sex-Intimacy & Heart Disease, Get SMART - Goal Setting: - Group verbal and written instruction through game format to discuss heart disease and the return to sexual intimacy. Provides group verbal and written material to discuss and apply goal setting through the application of the S.M.A.R.T. Method.   Other Matters of the Heart: - Provides group verbal, written materials and models to describe Heart  Failure, Angina, Valve Disease, and Diabetes in the realm of heart disease. Includes description of the disease process and treatment options available to the cardiac patient.   Exercise & Equipment Safety: - Individual verbal instruction and demonstration of equipment use and safety with use of the equipment. Flowsheet Row Cardiac Rehab from 10/30/2015 in [facility] Cardiac and Pulmonary Rehab  Date  10/30/15  Educator  C. EnterkinRN  Instruction Review Code  1- partially meets, needs review/practice      Infection Prevention: - Provides verbal and written material to individual with discussion of infection control including proper hand washing and proper equipment cleaning during exercise session. Flowsheet Row Cardiac Rehab from 10/30/2015 in [facility] Cardiac and Pulmonary Rehab  Date  10/30/15  Educator  C. EnterkinRN  Instruction Review Code  2- meets goals/outcomes      Falls Prevention: - Provides verbal and written material to individual with discussion of falls prevention and safety. Flowsheet Row Cardiac Rehab from 10/30/2015 in [facility] Cardiac and Pulmonary Rehab  Date  10/30/15  Educator  C. [place]  Instruction Review Code  2- meets goals/outcomes      Diabetes: - Individual verbal and written instruction to review signs/symptoms of diabetes, desired ranges of glucose level fasting, after meals and with exercise. Advice that pre and post exercise glucose checks will be done for 3 sessions at entry of program. [place] from 10/30/2015 in [facility] Cardiac and Pulmonary Rehab  Date  10/30/15  Educator  C.EnterkinRN  Instruction Review Code  1- partially  meets, needs review/practice       Knowledge Questionnaire Score:     Knowledge Questionnaire Score - 10/31/15 1145      Knowledge Questionnaire Score   Pre Score 20      Core Components/Risk Factors/Patient Goals at Admission:     Personal Goals and Risk Factors at Admission - 10/31/15 1147      Core  Components/Risk Factors/Patient Goals on Admission   Improve shortness of breath with ADL's Yes   Intervention Provide education, individualized exercise plan and daily activity instruction to help decrease symptoms of SOB with activities of daily living.   Expected Outcomes Short Term: Achieves a reduction of symptoms when performing activities of daily living.   Develop more efficient breathing techniques such as purse lipped breathing and diaphragmatic breathing; and practicing self-pacing with activity Yes   Intervention Provide education, demonstration and support about specific breathing techniuqes utilized for more efficient breathing. Include techniques such as pursed lipped breathing, diaphragmatic breathing and self-pacing activity.   Expected Outcomes Short Term: Participant will be able to demonstrate and use breathing techniques as needed throughout daily activities.   Diabetes Yes      Core Components/Risk Factors/Patient Goals Review:    Core Components/Risk Factors/Patient Goals at Discharge (Final Review):    ITP Comments:     ITP Comments    Row Frost 10/30/15 1302 10/30/15 1305 10/31/15 1159 10/31/15 1827 11/20/15 0821   ITP Comments Upon arrival Diana Frost c/o being dizzy today and yesterday. I had her sit down and took her blood pressure it was 80/60 in her left arm and right arm 78/50 so given 480 cc of water. Heart rate 107. AFter the water her blood pressure was 92/60 and heart rate 102 but she said she felt ok so 6 minute walk in the hallway was done. I watched the heart monitor screen while Diana Frost timed her 6 minute walk test etd Diana Frost is a diabetic on insulin. 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 Continue with ITP unless changes noted by Medical Director at signature of review.  Remains out for medical reason       Comments:

## 2015-11-21 ENCOUNTER — Ambulatory Visit: Payer: Medicare Other

## 2015-11-25 ENCOUNTER — Ambulatory Visit: Payer: Medicare Other

## 2015-11-25 LAB — SURGICAL PATHOLOGY

## 2015-11-27 ENCOUNTER — Ambulatory Visit: Payer: Medicare Other

## 2015-11-28 ENCOUNTER — Ambulatory Visit: Payer: Medicare Other

## 2015-11-29 ENCOUNTER — Emergency Department
Admission: EM | Admit: 2015-11-29 | Discharge: 2015-11-29 | Disposition: A | Discharge status: Home / Self Care | Payer: Medicare Other | Attending: Student | Admitting: Student

## 2015-11-29 ENCOUNTER — Emergency Department: Payer: Medicare Other

## 2015-11-29 DIAGNOSIS — W1809XA Striking against other object with subsequent fall, initial encounter: Secondary | ICD-10-CM | POA: Diagnosis not present

## 2015-11-29 DIAGNOSIS — Y999 Unspecified external cause status: Secondary | ICD-10-CM | POA: Insufficient documentation

## 2015-11-29 DIAGNOSIS — Z794 Long term (current) use of insulin: Secondary | ICD-10-CM | POA: Insufficient documentation

## 2015-11-29 DIAGNOSIS — S0990XA Unspecified injury of head, initial encounter: Secondary | ICD-10-CM

## 2015-11-29 DIAGNOSIS — Y9389 Activity, other specified: Secondary | ICD-10-CM | POA: Diagnosis not present

## 2015-11-29 DIAGNOSIS — Z87891 Personal history of nicotine dependence: Secondary | ICD-10-CM | POA: Insufficient documentation

## 2015-11-29 DIAGNOSIS — Z79899 Other long term (current) drug therapy: Secondary | ICD-10-CM | POA: Insufficient documentation

## 2015-11-29 DIAGNOSIS — I10 Essential (primary) hypertension: Secondary | ICD-10-CM | POA: Diagnosis not present

## 2015-11-29 DIAGNOSIS — Y92513 Shop (commercial) as the place of occurrence of the external cause: Secondary | ICD-10-CM | POA: Insufficient documentation

## 2015-11-29 DIAGNOSIS — S0003XA Contusion of scalp, initial encounter: Secondary | ICD-10-CM | POA: Insufficient documentation

## 2015-11-29 DIAGNOSIS — E1065 Type 1 diabetes mellitus with hyperglycemia: Secondary | ICD-10-CM | POA: Insufficient documentation

## 2015-11-29 DIAGNOSIS — Z23 Encounter for immunization: Secondary | ICD-10-CM | POA: Diagnosis not present

## 2015-11-29 DIAGNOSIS — Z7984 Long term (current) use of oral hypoglycemic drugs: Secondary | ICD-10-CM | POA: Diagnosis not present

## 2015-11-29 DIAGNOSIS — I2510 Atherosclerotic heart disease of native coronary artery without angina pectoris: Secondary | ICD-10-CM | POA: Insufficient documentation

## 2015-11-29 DIAGNOSIS — I252 Old myocardial infarction: Secondary | ICD-10-CM | POA: Insufficient documentation

## 2015-11-29 DIAGNOSIS — R739 Hyperglycemia, unspecified: Secondary | ICD-10-CM

## 2015-11-29 DIAGNOSIS — Z7982 Long term (current) use of aspirin: Secondary | ICD-10-CM | POA: Insufficient documentation

## 2015-11-29 DIAGNOSIS — S0093XA Contusion of unspecified part of head, initial encounter: Secondary | ICD-10-CM

## 2015-11-29 LAB — GLUCOSE, CAPILLARY
GLUCOSE-CAPILLARY: 302 mg/dL — AB (ref 65–99)
Glucose-Capillary: 344 mg/dL — ABNORMAL HIGH (ref 65–99)

## 2015-11-29 LAB — COMPREHENSIVE METABOLIC PANEL
ALBUMIN: 3.4 g/dL — AB (ref 3.5–5.0)
ALT: 16 U/L (ref 14–54)
AST: 25 U/L (ref 15–41)
Alkaline Phosphatase: 100 U/L (ref 38–126)
Anion gap: 7 (ref 5–15)
BUN: 13 mg/dL (ref 6–20)
CHLORIDE: 101 mmol/L (ref 101–111)
CO2: 27 mmol/L (ref 22–32)
Calcium: 8.6 mg/dL — ABNORMAL LOW (ref 8.9–10.3)
Creatinine, Ser: 0.69 mg/dL (ref 0.44–1.00)
GFR calc Af Amer: >60 mL/min (ref >60)
Glucose, Bld: 351 mg/dL — ABNORMAL HIGH (ref 65–99)
POTASSIUM: 3.9 mmol/L (ref 3.5–5.1)
Sodium: 135 mmol/L (ref 135–145)
Total Bilirubin: 0.8 mg/dL (ref 0.3–1.2)
Total Protein: 6.8 g/dL (ref 6.5–8.1)

## 2015-11-29 MED ORDER — SODIUM CHLORIDE 0.9 % IV BOLUS (SEPSIS)
500.0000 mL | Freq: Once | INTRAVENOUS | Status: AC
  Administered 2015-11-29: 500 mL via INTRAVENOUS

## 2015-11-29 MED ORDER — TETANUS-DIPHTH-ACELL PERTUSSIS 5-2.5-18.5 LF-MCG/0.5 IM SUSP
0.5000 mL | Freq: Once | INTRAMUSCULAR | Status: AC
Start: 2015-11-29 — End: 2015-11-29
  Administered 2015-11-29: 0.5 mL via INTRAMUSCULAR
  Filled 2015-11-29: qty 0.5

## 2015-11-29 MED ORDER — ACETAMINOPHEN 500 MG PO TABS
1000.0000 mg | ORAL_TABLET | Freq: Once | ORAL | Status: AC
  Administered 2015-11-29: 1000 mg via ORAL
  Filled 2015-11-29: qty 2

## 2015-11-29 NOTE — ED Notes (Signed)
Pt in CT.

## 2015-11-29 NOTE — ED Provider Notes (Addendum)
[facility] Emergency Department Provider Note   ____________________________________________   First MD Initiated Contact with Patient 11/29/15 1512     (approximate)  I have reviewed the triage vital signs and the nursing notes.   HISTORY  Chief Complaint Fall and Head Injury    HPI Diana Frost is a 70 y.o. female history of type 1 diabetes, coronary artery disease who presents for evaluation of posterior headache after mechanical fall which occurred suddenly just prior to arrival, headache is  moderate, sudden onset, constant, no modifying factors. Patient was at [facility], she was attempting to crouch under a short cement beam to retreive a shopping cart when she hit her head on the beam and then fell backward, hitting the back of her head on a cement curb. she is complaining of headache and some mild neck pain. No loss of consciousness, no nausea vomiting or diarrhea. She has otherwise been in her usual state of health. No chest pain, difficulty breathing, vomiting, fevers, chills.   Past Medical History:  Diagnosis Date  . Coronary artery disease   . Diabetes mellitus without complication (HCC)    insulin pump  . Headache    since ear problem started  . Hypertension   . Myocardial infarction [facility]) 11/01/13    Patient Active Problem List   Diagnosis Date Noted  . Melanoma ([place]) 11/01/2015  . Stable angina ([place]) 10/10/2015  . Coronary artery disease involving native coronary artery of native heart 11/16/2012  . History of insertion of insulin pump 05/24/2012  . Type 1 diabetes mellitus ([place]) 02/11/2011  . Depression 02/11/2011    Past Surgical History:  Procedure Laterality Date  . ABDOMINAL HYSTERECTOMY    . BACK SURGERY     tail bone removed after fall/fracture  . CATARACT EXTRACTION BILATERAL W/ ANTERIOR VITRECTOMY    . CORONARY ANGIOPLASTY WITH STENT PLACEMENT  11/01/13   [facility], stent x2  . MELANOMA EXCISION Right    arm  with skin graft  . MYRINGOTOMY WITH TUBE PLACEMENT Left 08/21/2015   Procedure: MYRINGOTOMY WITH TUBE PLACEMENT;  Surgeon: Carloyn Manner, MD;  Location: [place];  Service: ENT;  Laterality: Left;  . NASOPHARYNGEAL BIOPSY N/A 11/07/2015   Procedure: NASOPHARYNGEAL BIOPSY;  Surgeon: Carloyn Manner, MD;  Location: [facility];  Service: ENT;  Laterality: N/A;  . TONSILLECTOMY      Prior to Admission medications   Medication Sig Start Date End Date Taking? Authorizing Provider  amoxicillin-clavulanate (AUGMENTIN) 875-125 MG tablet Take 1 tablet by mouth 2 (two) times daily. 11/07/15   Carloyn Manner, MD  aspirin 81 MG tablet Take 81 mg by mouth daily.    Historical Provider, MD  FLUoxetine (PROZAC) 40 MG capsule Take 40 mg by mouth daily.    Historical Provider, MD  Glucagon, rDNA, (GLUCAGON EMERGENCY IJ) Inject as directed as needed (for low blood sugar).    Historical Provider, MD  insulin glargine (LANTUS) 100 UNIT/ML injection Inject 13 Units into the skin daily. As needed if insulin pump is not working     Management consultant, MD  insulin lispro (HUMALOG) 100 UNIT/ML injection Inject 13 Units into the skin over 24 hr. In insulin pump     Historical Provider, MD  losartan (COZAAR) 25 MG tablet Take 25 mg by mouth daily.    Historical Provider, MD  Magnesium 500 MG TABS Take 500 mg by mouth daily.     Historical Provider, MD  Melatonin 5 MG TABS Take 10 mg  by mouth at bedtime as needed (sleep).     Historical Provider, MD  metoprolol tartrate (LOPRESSOR) 25 MG tablet Take 12.5 mg by mouth daily.     Historical Provider, MD  Multiple Vitamin (MULTIVITAMIN) capsule Take 1 capsule by mouth daily.    Historical Provider, MD  ondansetron (ZOFRAN-ODT) 4 MG disintegrating tablet Take 4 mg by mouth every 8 (eight) hours as needed for nausea or vomiting.    Historical Provider, MD  rosuvastatin (CRESTOR) 10 MG tablet Take 10 mg by mouth daily.    Historical Provider, MD  traMADol (ULTRAM) 50  MG tablet Take 1 tablet (50 mg total) by mouth every 6 (six) hours as needed. 11/07/15   Carloyn Manner, MD    Allergies Influenza vaccines; Percocet [oxycodone-acetaminophen]; Statins; and Sulfa antibiotics  No family history on file.  Social History Social History  Substance Use Topics  . Smoking status: Former Smoker    Types: Cigarettes    Quit date: 04/30/1998  . Smokeless tobacco: Never Used  . Alcohol use 10.2 oz/week    17 Glasses of wine per week     Comment:      Review of Systems Constitutional: No fever/chills Eyes: No visual changes. ENT: No sore throat. Cardiovascular: Denies chest pain. Respiratory: Denies shortness of breath. Gastrointestinal: No abdominal pain.  No nausea, no vomiting.  No diarrhea.  No constipation. Genitourinary: Negative for dysuria. Musculoskeletal: Negative for back pain. Skin: Negative for rash. Neurological: Positive for headache, no focal weakness or numbness.  10-point ROS otherwise negative.  ____________________________________________   PHYSICAL EXAM:  Vitals:   11/29/15 1530 11/29/15 1600 11/29/15 1630 11/29/15 1746  BP: 111/63 128/62 (!) 126/58 (!) 147/87  Pulse: 97 (!) 102 (!) 102 98  Resp:    18  Temp:    98 F (36.7 C)  TempSrc:    Oral  SpO2: 97% 100% 100% 100%  Weight:      Height:         Constitutional: Alert and oriented. Well appearing and in no acute distress. Eyes: Conjunctivae are normal. PERRL. EOMI. Head: Hematoma in the posterior scalp with tenderness, no overlying abrasion. Nose: No congestion/rhinnorhea. Mouth/Throat: Mucous membranes are moist.  Oropharynx non-erythematous. Neck: No stridor.  No cervical spine tenderness to palpation. C-collar applied in the emergency department. Cardiovascular: tachycardic rate, regular rhythm. Grossly normal heart sounds.  Good peripheral circulation. Respiratory: Normal respiratory effort.  No retractions. Lungs CTAB. Gastrointestinal: Soft and nontender.  No distention. No CVA tenderness. Genitourinary: Deferred Musculoskeletal: No lower extremity tenderness nor edema.  No joint effusions. Neurologic:  Normal speech and language. No gross focal neurologic deficits are appreciated. 5 strength in bilateral upper and lower extremities, sensation intact to light touch throughout. Normal ambulation. Skin:  Skin is warm, dry and intact. No rash noted. Tiny hemostatic skin tear in the dorsum of the right distal arm. Psychiatric: Mood and affect are normal. Speech and behavior are normal.  ____________________________________________   LABS (all labs ordered are listed, but only abnormal results are displayed)  Labs Reviewed  GLUCOSE, CAPILLARY - Abnormal; Notable for the following:       Result Value   Glucose-Capillary 344 (*)    All other components within normal limits  COMPREHENSIVE METABOLIC PANEL - Abnormal; Notable for the following:    Glucose, Bld 351 (*)    Calcium 8.6 (*)    Albumin 3.4 (*)    All other components within normal limits  GLUCOSE, CAPILLARY - Abnormal; Notable for the  following:    Glucose-Capillary 302 (*)    All other components within normal limits   ____________________________________________  EKG  none ____________________________________________  RADIOLOGY  CT head and C-spine IMPRESSION:  1. No acute intracranial abnormality. Findings of chronic  microvascular disease.  2. 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  Unchanged size compared to prior studies. 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 Continued follow-up with  Otolaryngology is recommended.  3. No acute fracture or static subluxation of the cervical spine.      ____________________________________________   PROCEDURES  Procedure(s) performed:  None  Procedures  Critical Care performed: No  ____________________________________________   INITIAL IMPRESSION / ASSESSMENT AND PLAN / ED COURSE  Pertinent labs & imaging results that were available during my care of the patient were reviewed by me and considered in my medical decision making (see chart for details).  Diana Frost is a 70 y.o. female history of type 1 diabetes, coronary artery disease who presents for evaluation of posterior headache after mechanical fall which occurred suddenly just prior to arrival. On exam, she is very well-appearing and in no acute distress. She has an intact neurological examination. Vital signs on arrival were notable for tachycardia however this has resolved at the time of my assessment, we'll continue to monitor. Exam is only notable for hematoma and the occiput of the scalp as well as a tiny hemostatic abrasion in the right arm without any bony step-off or deformity. We'll update tetanus. Obtain CT head and C-spine. C-collar placed in the ER. Her glucose was 344 when checked, we'll obtain CMP. Reassess for disposition.  ----------------------------------------- 5:42 PM on 11/29/2015 ----------------------------------------- Patient continues to appear well, she reports improvement of her headache after Tylenol. Heart rate has improved to 96 bpm at the time of my reassessment. CT head and C-spine negative for any acute traumatic pathology, c-collar cleared by me. CMP showed glucose which was elevated at 351 initially, normal bicarbonate, normal anion gap, not condition with DKA, improved to 302 after fluids and self administration of insulin from her pump. The nasopharyngeal mass seen on CT is known and the patient is following up on Monday with an ENT doctor in [place] for repeat biopsy. I encouraged her to keep that appointment. We discussed meticulous return precautions and need for close PCP follow-up and she is comfortable with the discharge  plan. DC home.   Clinical Course     ____________________________________________   FINAL CLINICAL IMPRESSION(S) / ED DIAGNOSES  Final diagnoses:  Head injury, initial encounter  Closed head injury, initial encounter  Head contusion, initial encounter  Acute hyperglycemia      NEW MEDICATIONS STARTED DURING THIS VISIT:  New Prescriptions   No medications on file     Note:  This document was prepared using Dragon voice recognition software and may include unintentional dictation errors.    Joanne Gavel, MD 11/29/15 1745    Joanne Gavel, MD 11/29/15 [phone]

## 2015-11-29 NOTE — ED Triage Notes (Signed)
Pt arrives to ER via [facility] from [place]. Pt hit posterior head and front of head on "cement beam" and "cement curb". Pt denies LOC. Pt alert and oriented X4, active, cooperative, pt in NAD. RR even and unlabored, color WNL.

## 2015-11-29 NOTE — ED Notes (Signed)
Pt placed in c-collar

## 2015-11-29 NOTE — ED Notes (Signed)
Pt c/o posterior and anterior head pain, hematoma to back of head palpable, tender to touch. Pt c/o left sided neck pain, states "I think I got whiplash". Small skin tear to right anterior forearm. Pt alert and oriented X4, active, cooperative, pt in NAD. RR even and unlabored, color WNL.

## 2015-11-29 NOTE — ED Notes (Signed)
MD at bedside. 

## 2015-12-02 ENCOUNTER — Ambulatory Visit: Payer: Medicare Other

## 2015-12-04 ENCOUNTER — Ambulatory Visit: Payer: Medicare Other

## 2015-12-05 ENCOUNTER — Ambulatory Visit: Payer: Medicare Other

## 2015-12-11 ENCOUNTER — Ambulatory Visit: Payer: Medicare Other

## 2015-12-12 ENCOUNTER — Telehealth: Payer: Self-pay

## 2015-12-12 ENCOUNTER — Ambulatory Visit: Payer: Medicare Other

## 2015-12-12 ENCOUNTER — Ambulatory Visit: Payer: Medicare Other | Admitting: Internal Medicine

## 2015-12-12 NOTE — Telephone Encounter (Signed)
Diana Frost had another surgery in order to diagnose her cancer.  She sees her Dr in [facility] next week and will find out the treatment protocol.  She will call next week to let us know if she can begin [facility].  She states she is fine from her fall at [facility].

## 2015-12-16 ENCOUNTER — Ambulatory Visit: Payer: Medicare Other

## 2015-12-18 ENCOUNTER — Encounter: Payer: Self-pay | Admitting: *Deleted

## 2015-12-18 ENCOUNTER — Ambulatory Visit: Payer: Medicare Other

## 2015-12-18 DIAGNOSIS — I208 Other forms of angina pectoris: Secondary | ICD-10-CM

## 2015-12-18 NOTE — Progress Notes (Signed)
Cardiac Individual Treatment Plan  Patient Details  Name: Diana Frost MRN: [id number] Date of Birth: [birth year] Referring Provider:   Flowsheet Row Cardiac Rehab from 10/30/2015 in [facility] Cardiac and Pulmonary Rehab  Referring Provider  Sheppard Coil      Initial Encounter Date:  Flowsheet Row Cardiac Rehab from 10/30/2015 in [facility] Cardiac and Pulmonary Rehab  Date  10/30/15  Referring Provider  Sheppard Coil      Visit Diagnosis: Stable angina [facility])  Patient's Home Medications on Admission:  Current Outpatient Prescriptions:  .  amoxicillin-clavulanate (AUGMENTIN) 875-125 MG tablet, Take 1 tablet by mouth 2 (two) times daily., Disp: 20 tablet, Rfl: 0 .  aspirin 81 MG tablet, Take 81 mg by mouth daily., Disp: , Rfl:  .  FLUoxetine (PROZAC) 40 MG capsule, Take 40 mg by mouth daily., Disp: , Rfl:  .  Glucagon, rDNA, (GLUCAGON EMERGENCY IJ), Inject as directed as needed (for low blood sugar)., Disp: , Rfl:  .  insulin glargine (LANTUS) 100 UNIT/ML injection, Inject 13 Units into the skin daily. As needed if insulin pump is not working , Disp: , Rfl:  .  insulin lispro (HUMALOG) 100 UNIT/ML injection, Inject 13 Units into the skin over 24 hr. In insulin pump , Disp: , Rfl:  .  losartan (COZAAR) 25 MG tablet, Take 25 mg by mouth daily., Disp: , Rfl:  .  Magnesium 500 MG TABS, Take 500 mg by mouth daily. , Disp: , Rfl:  .  Melatonin 5 MG TABS, Take 10 mg by mouth at bedtime as needed (sleep). , Disp: , Rfl:  .  metoprolol tartrate (LOPRESSOR) 25 MG tablet, Take 12.5 mg by mouth daily. , Disp: , Rfl:  .  Multiple Vitamin (MULTIVITAMIN) capsule, Take 1 capsule by mouth daily., Disp: , Rfl:  .  ondansetron (ZOFRAN-ODT) 4 MG disintegrating tablet, Take 4 mg by mouth every 8 (eight) hours as needed for nausea or vomiting., Disp: , Rfl:  .  rosuvastatin (CRESTOR) 10 MG tablet, Take 10 mg by mouth daily., Disp: , Rfl:  .  traMADol (ULTRAM) 50 MG tablet, Take 1 tablet (50 mg total) by mouth every 6  (six) hours as needed., Disp: 30 tablet, Rfl: 0  Past Medical History: Past Medical History:  Diagnosis Date  . Coronary artery disease   . Diabetes mellitus without complication (HCC)    insulin pump  . Headache    since ear problem started  . Hypertension   . Myocardial infarction ([place]) 11/01/13    Tobacco Use: History  Smoking Status  . Former Smoker  . Types: Cigarettes  . Quit date: 04/30/1998  Smokeless Tobacco  . Never Used    Labs: Recent Review Flowsheet Data    There is no flowsheet data to display.       Exercise Target Goals:    Exercise Program Goal: Individual exercise prescription set with THRR, safety & activity barriers. Participant demonstrates ability to understand and report RPE using BORG scale, to self-measure pulse accurately, and to acknowledge the importance of the exercise prescription.  Exercise Prescription Goal: Starting with aerobic activity 30 plus minutes a day, 3 days per week for initial exercise prescription. Provide home exercise prescription and guidelines that participant acknowledges understanding prior to discharge.  Activity Barriers & Risk Stratification:     Activity Barriers & Cardiac Risk Stratification - 10/30/15 1259      Activity Barriers & Cardiac Risk Stratification   Cardiac Risk Stratification High      6 Minute Walk:  Boyce Name 10/30/15 1313         6 Minute Walk   Distance 1250 feet     Walk Time 6 minutes     MPH 2.36     METS 2.77     RPE 12     Perceived Dyspnea  2     VO2 Peak 9.7     Symptoms No     Resting HR 98 bpm     Resting BP 92/60     Max Ex. HR 112 bpm     Max Ex. BP 104/58        Initial Exercise Prescription:     Initial Exercise Prescription - 10/30/15 1300      Date of Initial Exercise RX and Referring Provider   Date 10/30/15   Referring Provider Sheppard Coil     Treadmill   MPH 2   Grade 0.5   Minutes 15   METs 2.67     Recumbant Bike   Level  1   RPM 60   Watts 10   Minutes 15   METs 2.52     NuStep   Level 1   Watts 10   Minutes 15   METs 2.52     REL-XR   Level 1   Watts 30   Minutes 15   METs 2.5     Biostep-RELP   Level 1   Watts 15   Minutes 15   METs 2.5     Prescription Details   Frequency (times per week) 3   Duration Progress to 45 minutes of aerobic exercise without signs/symptoms of physical distress     Intensity   THRR 40-80% of Max Heartrate 118-139   Ratings of Perceived Exertion 11-15   Perceived Dyspnea 0-4     Progression   Progression Continue to progress workloads to maintain intensity without signs/symptoms of physical distress.     Resistance Training   Training Prescription Yes   Weight 2   Reps 10-15      Perform Capillary Blood Glucose checks as needed.  Exercise Prescription Changes:   Exercise Comments:     Exercise Comments    Row Name 11/14/15 1527 11/28/15 1251         Exercise Comments Ms Schlotzhauer is waiting unti lafter she rovers from surgery to begin Heart Track. Mikela plans to begin Heart Track after recovery from surgery for another medical issue.         Discharge Exercise Prescription (Final Exercise Prescription Changes):   Nutrition:  Target Goals: Understanding of nutrition guidelines, daily intake of sodium 1500mg , cholesterol 200mg , calories 30% from fat and 7% or less from saturated fats, daily to have 5 or more servings of fruits and vegetables.  Biometrics:     Pre Biometrics - 10/30/15 1310      Pre Biometrics   Height 5' 1.4" (1.56 m)   Weight 127 lb 11.2 oz (57.9 kg)   Waist Circumference 32 inches   Hip Circumference 37 inches   Waist to Hip Ratio 0.86 %   BMI (Calculated) 23.9       Nutrition Therapy Plan and Nutrition Goals:     Nutrition Therapy & Goals - 10/30/15 1304      Nutrition Therapy   Drug/Food Interactions Statins/Certain Fruits     Personal Nutrition Goals   Personal Goal #1 --  Diana Frost is a diabetic  on insulin. Her fasting blood sugar reported from home  today was 126.      Intervention Plan   Intervention Prescribe, educate and counsel regarding individualized specific dietary modifications aiming towards targeted core components such as weight, hypertension, lipid management, diabetes, heart failure and other comorbidities.   Expected Outcomes Short Term Goal: Understand basic principles of dietary content, such as calories, fat, sodium, cholesterol and nutrients.;Short Term Goal: A plan has been developed with personal nutrition goals set during dietitian appointment.;Long Term Goal: Adherence to prescribed nutrition plan.      Nutrition Discharge: Rate Your Plate Scores:   Nutrition Goals Re-Evaluation:   Psychosocial: Target Goals: Acknowledge presence or absence of depression, maximize coping skills, provide positive support system. Participant is able to verbalize types and ability to use techniques and skills needed for reducing stress and depression.  Initial Review & Psychosocial Screening:     Initial Psych Review & Screening - 10/30/15 1305      Initial Review   Current issues with Current Stress Concerns   Source of Stress Concerns Chronic Illness   Comments Her husband has a chronic illness condition.      Family Dynamics   Good Support System? Yes     Barriers   Psychosocial barriers to participate in program The patient should benefit from training in stress management and relaxation.     Screening Interventions   Interventions Encouraged to exercise;Program counselor consult      Quality of Life Scores:     Quality of Life - 10/31/15 1148      Quality of Life Scores   Health/Function Pre 24 %   Socioeconomic Pre 27 %   Psych/Spiritual Pre 24.86 %   Family Pre 30 %   GLOBAL Pre 25.74 %      PHQ-9: Recent Review Flowsheet Data    Depression screen [facility] 2/9 10/30/2015   Decreased Interest 1   Down, Depressed, Hopeless 1   PHQ - 2 Score 2    Altered sleeping 1   Tired, decreased energy 2   Change in appetite 1   Feeling bad or failure about yourself  1   Trouble concentrating 0   Moving slowly or fidgety/restless 0   Suicidal thoughts 0   PHQ-9 Score 7      Psychosocial Evaluation and Intervention:   Psychosocial Re-Evaluation:   Vocational Rehabilitation: Provide vocational rehab assistance to qualifying candidates.   Vocational Rehab Evaluation & Intervention:     Vocational Rehab - 10/30/15 1259      Initial Vocational Rehab Evaluation & Intervention   Assessment shows need for Vocational Rehabilitation No      Education: Education Goals: Education classes will be provided on a weekly basis, covering required topics. Participant will state understanding/return demonstration of topics presented.  Learning Barriers/Preferences:     Learning Barriers/Preferences - 10/31/15 1145      Learning Barriers/Preferences   Learning Barriers None   Learning Preferences None      Education Topics: General Nutrition Guidelines/Fats and Fiber: -Group instruction provided by verbal, written material, models and posters to present the general guidelines for heart healthy nutrition. Gives an explanation and review of dietary fats and fiber.   Controlling Sodium/Reading Food Labels: -Group verbal and written material supporting the discussion of sodium use in heart healthy nutrition. Review and explanation with models, verbal and written materials for utilization of the food label.   Exercise Physiology & Risk Factors: - Group verbal and written instruction with models to review the exercise physiology of the cardiovascular system and  associated critical values. Details cardiovascular disease risk factors and the goals associated with each risk factor.   Aerobic Exercise & Resistance Training: - Gives group verbal and written discussion on the health impact of inactivity. On the components of aerobic and  resistive training programs and the benefits of this training and how to safely progress through these programs.   Flexibility, Balance, General Exercise Guidelines: - Provides group verbal and written instruction on the benefits of flexibility and balance training programs. Provides general exercise guidelines with specific guidelines to those with heart or lung disease. Demonstration and skill practice provided.   Stress Management: - Provides group verbal and written instruction about the health risks of elevated stress, cause of high stress, and healthy ways to reduce stress.   Depression: - Provides group verbal and written instruction on the correlation between heart/lung disease and depressed mood, treatment options, and the stigmas associated with seeking treatment.   Anatomy & Physiology of the Heart: - Group verbal and written instruction and models provide basic cardiac anatomy and physiology, with the coronary electrical and arterial systems. Review of: AMI, Angina, Valve disease, Heart Failure, Cardiac Arrhythmia, Pacemakers, and the ICD.   Cardiac Procedures: - Group verbal and written instruction and models to describe the testing methods done to diagnose heart disease. Reviews the outcomes of the test results. Describes the treatment choices: Medical Management, Angioplasty, or Coronary Bypass Surgery.   Cardiac Medications: - Group verbal and written instruction to review commonly prescribed medications for heart disease. Reviews the medication, class of the drug, and side effects. Includes the steps to properly store meds and maintain the prescription regimen.   Go Sex-Intimacy & Heart Disease, Get SMART - Goal Setting: - Group verbal and written instruction through game format to discuss heart disease and the return to sexual intimacy. Provides group verbal and written material to discuss and apply goal setting through the application of the S.M.A.R.T.  Method.   Other Matters of the Heart: - Provides group verbal, written materials and models to describe Heart Failure, Angina, Valve Disease, and Diabetes in the realm of heart disease. Includes description of the disease process and treatment options available to the cardiac patient.   Exercise & Equipment Safety: - Individual verbal instruction and demonstration of equipment use and safety with use of the equipment. Flowsheet Row Cardiac Rehab from 10/30/2015 in [facility] Cardiac and Pulmonary Rehab  Date  10/30/15  Educator  C. EnterkinRN  Instruction Review Code  1- partially meets, needs review/practice      Infection Prevention: - Provides verbal and written material to individual with discussion of infection control including proper hand washing and proper equipment cleaning during exercise session. Flowsheet Row Cardiac Rehab from 10/30/2015 in [facility] Cardiac and Pulmonary Rehab  Date  10/30/15  Educator  C. EnterkinRN  Instruction Review Code  2- meets goals/outcomes      Falls Prevention: - Provides verbal and written material to individual with discussion of falls prevention and safety. Flowsheet Row Cardiac Rehab from 10/30/2015 in [facility] Cardiac and Pulmonary Rehab  Date  10/30/15  Educator  C. [place]  Instruction Review Code  2- meets goals/outcomes      Diabetes: - Individual verbal and written instruction to review signs/symptoms of diabetes, desired ranges of glucose level fasting, after meals and with exercise. Advice that pre and post exercise glucose checks will be done for 3 sessions at entry of program. [place] from 10/30/2015 in [facility] Cardiac and Pulmonary Rehab  Date  10/30/15  Educator  C.Leroy  Instruction Review Code  1- partially meets, needs review/practice       Knowledge Questionnaire Score:     Knowledge Questionnaire Score - 10/31/15 1145      Knowledge Questionnaire Score   Pre Score 20      Core Components/Risk  Factors/Patient Goals at Admission:     Personal Goals and Risk Factors at Admission - 10/31/15 1147      Core Components/Risk Factors/Patient Goals on Admission   Improve shortness of breath with ADL's Yes   Intervention Provide education, individualized exercise plan and daily activity instruction to help decrease symptoms of SOB with activities of daily living.   Expected Outcomes Short Term: Achieves a reduction of symptoms when performing activities of daily living.   Develop more efficient breathing techniques such as purse lipped breathing and diaphragmatic breathing; and practicing self-pacing with activity Yes   Intervention Provide education, demonstration and support about specific breathing techniuqes utilized for more efficient breathing. Include techniques such as pursed lipped breathing, diaphragmatic breathing and self-pacing activity.   Expected Outcomes Short Term: Participant will be able to demonstrate and use breathing techniques as needed throughout daily activities.   Diabetes Yes      Core Components/Risk Factors/Patient Goals Review:    Core Components/Risk Factors/Patient Goals at Discharge (Final Review):    ITP Comments:     ITP Comments    Row Name 10/30/15 1302 10/30/15 1305 10/31/15 1159 10/31/15 1827 11/20/15 0821   ITP Comments Upon arrival Ariane c/o being dizzy today and yesterday. I had her sit down and took her blood pressure it was 80/60 in her left arm and right arm 78/50 so given 480 cc of water. Heart rate 107. AFter the water her blood pressure was 92/60 and heart rate 102 but she said she felt ok so 6 minute walk in the hallway was done. I watched the heart monitor screen while REbecca Sicles timed her 6 minute walk test etd Shermika is a diabetic on insulin. 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  Continue with ITP unless changes noted by Medical Director at signature of review.  Remains out for medical reason   Row Name 11/28/15 1252 12/18/15 0656         ITP Comments Lonetta plans to begin Heart Track after recovery from surgery for another medical issue. 30 day review. Continue with ITP unless changes noted by Medical Director at signature of review. Remains absent for medical reassons         Comments:

## 2015-12-19 ENCOUNTER — Ambulatory Visit: Payer: Medicare Other

## 2015-12-23 ENCOUNTER — Ambulatory Visit: Payer: Medicare Other

## 2015-12-25 ENCOUNTER — Ambulatory Visit: Payer: Medicare Other

## 2015-12-26 ENCOUNTER — Ambulatory Visit: Payer: Medicare Other

## 2015-12-30 ENCOUNTER — Ambulatory Visit: Payer: Medicare Other

## 2016-01-01 ENCOUNTER — Ambulatory Visit: Payer: Medicare Other

## 2016-01-02 ENCOUNTER — Ambulatory Visit: Payer: Medicare Other

## 2016-01-06 ENCOUNTER — Ambulatory Visit: Payer: Medicare Other

## 2016-01-08 ENCOUNTER — Ambulatory Visit: Payer: Medicare Other

## 2016-01-09 ENCOUNTER — Ambulatory Visit: Payer: Medicare Other

## 2016-01-09 ENCOUNTER — Telehealth: Payer: Self-pay

## 2016-01-09 DIAGNOSIS — I208 Other forms of angina pectoris: Secondary | ICD-10-CM

## 2016-01-09 NOTE — Progress Notes (Signed)
Cardiac Individual Treatment Plan  Patient Details  Name: Diana Frost MRN: [id number] Date of Birth: May 24, 1945 Referring Provider:   Flowsheet Row Cardiac Rehab from 10/30/2015 in [facility] Cardiac and Pulmonary Rehab  Referring Provider  Sheppard Coil      Initial Encounter Date:  Flowsheet Row Cardiac Rehab from 10/30/2015 in [facility] Cardiac and Pulmonary Rehab  Date  10/30/15  Referring Provider  Sheppard Coil      Visit Diagnosis: Stable angina [facility])  Patient's Home Medications on Admission:  Current Outpatient Prescriptions:  .  amoxicillin-clavulanate (AUGMENTIN) 875-125 MG tablet, Take 1 tablet by mouth 2 (two) times daily., Disp: 20 tablet, Rfl: 0 .  aspirin 81 MG tablet, Take 81 mg by mouth daily., Disp: , Rfl:  .  FLUoxetine (PROZAC) 40 MG capsule, Take 40 mg by mouth daily., Disp: , Rfl:  .  Glucagon, rDNA, (GLUCAGON EMERGENCY IJ), Inject as directed as needed (for low blood sugar)., Disp: , Rfl:  .  insulin glargine (LANTUS) 100 UNIT/ML injection, Inject 13 Units into the skin daily. As needed if insulin pump is not working , Disp: , Rfl:  .  insulin lispro (HUMALOG) 100 UNIT/ML injection, Inject 13 Units into the skin over 24 hr. In insulin pump , Disp: , Rfl:  .  losartan (COZAAR) 25 MG tablet, Take 25 mg by mouth daily., Disp: , Rfl:  .  Magnesium 500 MG TABS, Take 500 mg by mouth daily. , Disp: , Rfl:  .  Melatonin 5 MG TABS, Take 10 mg by mouth at bedtime as needed (sleep). , Disp: , Rfl:  .  metoprolol tartrate (LOPRESSOR) 25 MG tablet, Take 12.5 mg by mouth daily. , Disp: , Rfl:  .  Multiple Vitamin (MULTIVITAMIN) capsule, Take 1 capsule by mouth daily., Disp: , Rfl:  .  ondansetron (ZOFRAN-ODT) 4 MG disintegrating tablet, Take 4 mg by mouth every 8 (eight) hours as needed for nausea or vomiting., Disp: , Rfl:  .  rosuvastatin (CRESTOR) 10 MG tablet, Take 10 mg by mouth daily., Disp: , Rfl:  .  traMADol (ULTRAM) 50 MG tablet, Take 1 tablet (50 mg total) by mouth every 6  (six) hours as needed., Disp: 30 tablet, Rfl: 0  Past Medical History: Past Medical History:  Diagnosis Date  . Coronary artery disease   . Diabetes mellitus without complication (HCC)    insulin pump  . Headache    since ear problem started  . Hypertension   . Myocardial infarction 11/01/13    Tobacco Use: History  Smoking Status  . Former Smoker  . Types: Cigarettes  . Quit date: 04/30/1998  Smokeless Tobacco  . Never Used    Labs: Recent Review Flowsheet Data    There is no flowsheet data to display.       Exercise Target Goals:    Exercise Program Goal: Individual exercise prescription set with THRR, safety & activity barriers. Participant demonstrates ability to understand and report RPE using BORG scale, to self-measure pulse accurately, and to acknowledge the importance of the exercise prescription.  Exercise Prescription Goal: Starting with aerobic activity 30 plus minutes a day, 3 days per week for initial exercise prescription. Provide home exercise prescription and guidelines that participant acknowledges understanding prior to discharge.  Activity Barriers & Risk Stratification:     Activity Barriers & Cardiac Risk Stratification - 10/30/15 1259      Activity Barriers & Cardiac Risk Stratification   Cardiac Risk Stratification High      6 Minute Walk:  Donora Name 10/30/15 1313         6 Minute Walk   Distance 1250 feet     Walk Time 6 minutes     MPH 2.36     METS 2.77     RPE 12     Perceived Dyspnea  2     VO2 Peak 9.7     Symptoms No     Resting HR 98 bpm     Resting BP 92/60     Max Ex. HR 112 bpm     Max Ex. BP 104/58        Initial Exercise Prescription:     Initial Exercise Prescription - 10/30/15 1300      Date of Initial Exercise RX and Referring Provider   Date 10/30/15   Referring Provider Sheppard Coil     Treadmill   MPH 2   Grade 0.5   Minutes 15   METs 2.67     Recumbant Bike   Level 1    RPM 60   Watts 10   Minutes 15   METs 2.52     NuStep   Level 1   Watts 10   Minutes 15   METs 2.52     REL-XR   Level 1   Watts 30   Minutes 15   METs 2.5     Biostep-RELP   Level 1   Watts 15   Minutes 15   METs 2.5     Prescription Details   Frequency (times per week) 3   Duration Progress to 45 minutes of aerobic exercise without signs/symptoms of physical distress     Intensity   THRR 40-80% of Max Heartrate 118-139   Ratings of Perceived Exertion 11-15   Perceived Dyspnea 0-4     Progression   Progression Continue to progress workloads to maintain intensity without signs/symptoms of physical distress.     Resistance Training   Training Prescription Yes   Weight 2   Reps 10-15      Perform Capillary Blood Glucose checks as needed.  Exercise Prescription Changes:   Exercise Comments:     Exercise Comments    Row Name 11/14/15 1527 11/28/15 1251 12/25/15 1420 01/08/16 1304     Exercise Comments Ms Rechner is waiting unti lafter she rovers from surgery to begin Heart Track. Maris plans to begin Heart Track after recovery from surgery for another medical issue. Chynia has not yet attended a Heart Track exercise session. Yoshimi has not yet attended Heart Track.       Discharge Exercise Prescription (Final Exercise Prescription Changes):   Nutrition:  Target Goals: Understanding of nutrition guidelines, daily intake of sodium 1500mg , cholesterol 200mg , calories 30% from fat and 7% or less from saturated fats, daily to have 5 or more servings of fruits and vegetables.  Biometrics:     Pre Biometrics - 10/30/15 1310      Pre Biometrics   Height 5' 1.4" (1.56 m)   Weight 127 lb 11.2 oz (57.9 kg)   Waist Circumference 32 inches   Hip Circumference 37 inches   Waist to Hip Ratio 0.86 %   BMI (Calculated) 23.9       Nutrition Therapy Plan and Nutrition Goals:     Nutrition Therapy & Goals - 10/30/15 1304      Nutrition Therapy    Drug/Food Interactions Statins/Certain Fruits     Personal Nutrition Goals   Personal Goal #1 --  Chrysa is a diabetic on insulin. Her fasting blood sugar reported from home today was 126.      Intervention Plan   Intervention Prescribe, educate and counsel regarding individualized specific dietary modifications aiming towards targeted core components such as weight, hypertension, lipid management, diabetes, heart failure and other comorbidities.   Expected Outcomes Short Term Goal: Understand basic principles of dietary content, such as calories, fat, sodium, cholesterol and nutrients.;Short Term Goal: A plan has been developed with personal nutrition goals set during dietitian appointment.;Long Term Goal: Adherence to prescribed nutrition plan.      Nutrition Discharge: Rate Your Plate Scores:   Nutrition Goals Re-Evaluation:   Psychosocial: Target Goals: Acknowledge presence or absence of depression, maximize coping skills, provide positive support system. Participant is able to verbalize types and ability to use techniques and skills needed for reducing stress and depression.  Initial Review & Psychosocial Screening:     Initial Psych Review & Screening - 10/30/15 1305      Initial Review   Current issues with Current Stress Concerns   Source of Stress Concerns Chronic Illness   Comments Her husband has a chronic illness condition.      Family Dynamics   Good Support System? Yes     Barriers   Psychosocial barriers to participate in program The patient should benefit from training in stress management and relaxation.     Screening Interventions   Interventions Encouraged to exercise;Program counselor consult      Quality of Life Scores:     Quality of Life - 10/31/15 1148      Quality of Life Scores   Health/Function Pre 24 %   Socioeconomic Pre 27 %   Psych/Spiritual Pre 24.86 %   Family Pre 30 %   GLOBAL Pre 25.74 %      PHQ-9: Recent Review Flowsheet  Data    Depression screen [facility] 2/9 10/30/2015   Decreased Interest 1   Down, Depressed, Hopeless 1   PHQ - 2 Score 2   Altered sleeping 1   Tired, decreased energy 2   Change in appetite 1   Feeling bad or failure about yourself  1   Trouble concentrating 0   Moving slowly or fidgety/restless 0   Suicidal thoughts 0   PHQ-9 Score 7      Psychosocial Evaluation and Intervention:   Psychosocial Re-Evaluation:   Vocational Rehabilitation: Provide vocational rehab assistance to qualifying candidates.   Vocational Rehab Evaluation & Intervention:     Vocational Rehab - 10/30/15 1259      Initial Vocational Rehab Evaluation & Intervention   Assessment shows need for Vocational Rehabilitation No      Education: Education Goals: Education classes will be provided on a weekly basis, covering required topics. Participant will state understanding/return demonstration of topics presented.  Learning Barriers/Preferences:     Learning Barriers/Preferences - 10/31/15 1145      Learning Barriers/Preferences   Learning Barriers None   Learning Preferences None      Education Topics: General Nutrition Guidelines/Fats and Fiber: -Group instruction provided by verbal, written material, models and posters to present the general guidelines for heart healthy nutrition. Gives an explanation and review of dietary fats and fiber.   Controlling Sodium/Reading Food Labels: -Group verbal and written material supporting the discussion of sodium use in heart healthy nutrition. Review and explanation with models, verbal and written materials for utilization of the food label.   Exercise Physiology & Risk Factors: - Group verbal and written  instruction with models to review the exercise physiology of the cardiovascular system and associated critical values. Details cardiovascular disease risk factors and the goals associated with each risk factor.   Aerobic Exercise & Resistance  Training: - Gives group verbal and written discussion on the health impact of inactivity. On the components of aerobic and resistive training programs and the benefits of this training and how to safely progress through these programs.   Flexibility, Balance, General Exercise Guidelines: - Provides group verbal and written instruction on the benefits of flexibility and balance training programs. Provides general exercise guidelines with specific guidelines to those with heart or lung disease. Demonstration and skill practice provided.   Stress Management: - Provides group verbal and written instruction about the health risks of elevated stress, cause of high stress, and healthy ways to reduce stress.   Depression: - Provides group verbal and written instruction on the correlation between heart/lung disease and depressed mood, treatment options, and the stigmas associated with seeking treatment.   Anatomy & Physiology of the Heart: - Group verbal and written instruction and models provide basic cardiac anatomy and physiology, with the coronary electrical and arterial systems. Review of: AMI, Angina, Valve disease, Heart Failure, Cardiac Arrhythmia, Pacemakers, and the ICD.   Cardiac Procedures: - Group verbal and written instruction and models to describe the testing methods done to diagnose heart disease. Reviews the outcomes of the test results. Describes the treatment choices: Medical Management, Angioplasty, or Coronary Bypass Surgery.   Cardiac Medications: - Group verbal and written instruction to review commonly prescribed medications for heart disease. Reviews the medication, class of the drug, and side effects. Includes the steps to properly store meds and maintain the prescription regimen.   Go Sex-Intimacy & Heart Disease, Get SMART - Goal Setting: - Group verbal and written instruction through game format to discuss heart disease and the return to sexual intimacy. Provides  group verbal and written material to discuss and apply goal setting through the application of the S.M.A.R.T. Method.   Other Matters of the Heart: - Provides group verbal, written materials and models to describe Heart Failure, Angina, Valve Disease, and Diabetes in the realm of heart disease. Includes description of the disease process and treatment options available to the cardiac patient.   Exercise & Equipment Safety: - Individual verbal instruction and demonstration of equipment use and safety with use of the equipment. Flowsheet Row Cardiac Rehab from 10/30/2015 in [facility] Cardiac and Pulmonary Rehab  Date  10/30/15  Educator  C. EnterkinRN  Instruction Review Code  1- partially meets, needs review/practice      Infection Prevention: - Provides verbal and written material to individual with discussion of infection control including proper hand washing and proper equipment cleaning during exercise session. Flowsheet Row Cardiac Rehab from 10/30/2015 in [facility] Cardiac and Pulmonary Rehab  Date  10/30/15  Educator  C. EnterkinRN  Instruction Review Code  2- meets goals/outcomes      Falls Prevention: - Provides verbal and written material to individual with discussion of falls prevention and safety. Flowsheet Row Cardiac Rehab from 10/30/2015 in [facility] Cardiac and Pulmonary Rehab  Date  10/30/15  Educator  C. Wayland  Instruction Review Code  2- meets goals/outcomes      Diabetes: - Individual verbal and written instruction to review signs/symptoms of diabetes, desired ranges of glucose level fasting, after meals and with exercise. Advice that pre and post exercise glucose checks will be done for 3 sessions at entry of program. Flowsheet  Row Cardiac Rehab from 10/30/2015 in [facility] Cardiac and Pulmonary Rehab  Date  10/30/15  Educator  C.Maybee  Instruction Review Code  1- partially meets, needs review/practice       Knowledge Questionnaire Score:     Knowledge  Questionnaire Score - 10/31/15 1145      Knowledge Questionnaire Score   Pre Score 20      Core Components/Risk Factors/Patient Goals at Admission:     Personal Goals and Risk Factors at Admission - 10/31/15 1147      Core Components/Risk Factors/Patient Goals on Admission   Improve shortness of breath with ADL's Yes   Intervention Provide education, individualized exercise plan and daily activity instruction to help decrease symptoms of SOB with activities of daily living.   Expected Outcomes Short Term: Achieves a reduction of symptoms when performing activities of daily living.   Develop more efficient breathing techniques such as purse lipped breathing and diaphragmatic breathing; and practicing self-pacing with activity Yes   Intervention Provide education, demonstration and support about specific breathing techniuqes utilized for more efficient breathing. Include techniques such as pursed lipped breathing, diaphragmatic breathing and self-pacing activity.   Expected Outcomes Short Term: Participant will be able to demonstrate and use breathing techniques as needed throughout daily activities.   Diabetes Yes      Core Components/Risk Factors/Patient Goals Review:    Core Components/Risk Factors/Patient Goals at Discharge (Final Review):    ITP Comments:     ITP Comments    Row Name 10/30/15 1302 10/30/15 1305 10/31/15 1159 10/31/15 1827 11/20/15 0821   ITP Comments Upon arrival Thavy c/o being dizzy today and yesterday. I had her sit down and took her blood pressure it was 80/60 in her left arm and right arm 78/50 so given 480 cc of water. Heart rate 107. AFter the water her blood pressure was 92/60 and heart rate 102 but she said she felt ok so 6 minute walk in the hallway was done. I watched the heart monitor screen while REbecca Sicles timed her 6 minute walk test etd Deneene is a diabetic on insulin. 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 Continue with ITP unless changes noted by Medical Director at signature of review.  Remains out for medical reason   Row Name 11/28/15 1252 12/18/15 [id number] 12/25/15 1421 01/08/16 1304 01/09/16 1036   ITP Comments Katharine Look plans to begin Heart Track after recovery from surgery for another medical issue. 30 day review. Continue with ITP unless changes noted by Medical Director at signature of review. Remains absent for medical reassons Paytton has not yet attended a Heart Track exercise session. Francelle has not yet attended Heart Track. 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  She would like to begin Heart Track when she is finished with her treatment.      Comments: discharge ITP

## 2016-01-09 NOTE — Progress Notes (Signed)
Discharge Summary  Patient Details  Name: Diana Frost MRN: [id number] Date of Birth: [birth year] Referring Provider:   Flowsheet Row Cardiac Rehab from 10/30/2015 in [facility] Cardiac and Pulmonary Rehab  Referring Provider  Sheppard Coil       Number of Visits: 1  Reason for Discharge:  Early Exit:  Personal  Smoking History:  History  Smoking Status  . Former Smoker  . Types: Cigarettes  . Quit date: 04/30/1998  Smokeless Tobacco  . Never Used    Diagnosis:  Stable angina ([place])  ADL UCSD:   Initial Exercise Prescription:     Initial Exercise Prescription - 10/30/15 1300      Date of Initial Exercise RX and Referring Provider   Date 10/30/15   Referring Provider Sheppard Coil     Treadmill   MPH 2   Grade 0.5   Minutes 15   METs 2.67     Recumbant Bike   Level 1   RPM 60   Watts 10   Minutes 15   METs 2.52     NuStep   Level 1   Watts 10   Minutes 15   METs 2.52     REL-XR   Level 1   Watts 30   Minutes 15   METs 2.5     Biostep-RELP   Level 1   Watts 15   Minutes 15   METs 2.5     Prescription Details   Frequency (times per week) 3   Duration Progress to 45 minutes of aerobic exercise without signs/symptoms of physical distress     Intensity   THRR 40-80% of Max Heartrate 118-139   Ratings of Perceived Exertion 11-15   Perceived Dyspnea 0-4     Progression   Progression Continue to progress workloads to maintain intensity without signs/symptoms of physical distress.     Resistance Training   Training Prescription Yes   Weight 2   Reps 10-15      Discharge Exercise Prescription (Final Exercise Prescription Changes):   Functional Capacity:     6 Minute Walk    Row Name 10/30/15 1313         6 Minute Walk   Distance 1250 feet     Walk Time 6 minutes     MPH 2.36     METS 2.77     RPE 12     Perceived Dyspnea  2     VO2 Peak 9.7     Symptoms No     Resting HR 98 bpm     Resting BP 92/60     Max Ex. HR 112 bpm     Max Ex. BP 104/58        Psychological, QOL, Others - Outcomes: PHQ 2/9: Depression screen PHQ 2/9 10/30/2015  Decreased Interest 1  Down, Depressed, Hopeless 1  PHQ - 2 Score 2  Altered sleeping 1  Tired, decreased energy 2  Change in appetite 1  Feeling bad or failure about yourself  1  Trouble concentrating 0  Moving slowly or fidgety/restless 0  Suicidal thoughts 0  PHQ-9 Score 7    Quality of Life:     Quality of Life - 10/31/15 1148      Quality of Life Scores   Health/Function Pre 24 %   Socioeconomic Pre 27 %   Psych/Spiritual Pre 24.86 %   Family Pre 30 %   GLOBAL Pre 25.74 %      Personal Goals: Goals established at orientation  with interventions provided to work toward goal.     Personal Goals and Risk Factors at Admission - 10/31/15 1147      Core Components/Risk Factors/Patient Goals on Admission   Improve shortness of breath with ADL's Yes   Intervention Provide education, individualized exercise plan and daily activity instruction to help decrease symptoms of SOB with activities of daily living.   Expected Outcomes Short Term: Achieves a reduction of symptoms when performing activities of daily living.   Develop more efficient breathing techniques such as purse lipped breathing and diaphragmatic breathing; and practicing self-pacing with activity Yes   Intervention Provide education, demonstration and support about specific breathing techniuqes utilized for more efficient breathing. Include techniques such as pursed lipped breathing, diaphragmatic breathing and self-pacing activity.   Expected Outcomes Short Term: Participant will be able to demonstrate and use breathing techniques as needed throughout daily activities.   Diabetes Yes       Personal Goals Discharge:   Nutrition & Weight - Outcomes:     Pre Biometrics - 10/30/15 1310      Pre Biometrics   Height 5' 1.4" (1.56 m)   Weight 127 lb 11.2 oz (57.9 kg)   Waist Circumference 32  inches   Hip Circumference 37 inches   Waist to Hip Ratio 0.86 %   BMI (Calculated) 23.9       Nutrition:     Nutrition Therapy & Goals - 10/30/15 1304      Nutrition Therapy   Drug/Food Interactions Statins/Certain Fruits     Personal Nutrition Goals   Personal Goal #1 --  Diana Frost is a diabetic on insulin. Her fasting blood sugar reported from home today was 126.      Intervention Plan   Intervention Prescribe, educate and counsel regarding individualized specific dietary modifications aiming towards targeted core components such as weight, hypertension, lipid management, diabetes, heart failure and other comorbidities.   Expected Outcomes Short Term Goal: Understand basic principles of dietary content, such as calories, fat, sodium, cholesterol and nutrients.;Short Term Goal: A plan has been developed with personal nutrition goals set during dietitian appointment.;Long Term Goal: Adherence to prescribed nutrition plan.      Nutrition Discharge:   Education Questionnaire Score:     Knowledge Questionnaire Score - 10/31/15 1145      Knowledge Questionnaire Score   Pre Score 20      Goals reviewed with patient; copy given to patient.

## 2016-01-09 NOTE — Telephone Encounter (Signed)
Has chondrosarcoma of ear - going to [facility] in [facility] 10/11 - proton treatment through January.   He would like to begin Heart Track when she finishes treatment.

## 2016-01-13 ENCOUNTER — Ambulatory Visit: Payer: Medicare Other

## 2016-01-15 ENCOUNTER — Ambulatory Visit: Payer: Medicare Other

## 2016-01-15 ENCOUNTER — Encounter: Payer: Self-pay | Admitting: *Deleted

## 2016-01-15 DIAGNOSIS — I208 Other forms of angina pectoris: Secondary | ICD-10-CM

## 2016-01-15 NOTE — Progress Notes (Signed)
Cardiac Individual Treatment Plan  Patient Details  [name] MRN: [id number] Date of Birth: December 16, 1945 Referring Provider:   Flowsheet [facility] 10/30/2015 [facility]  Referring Provider  [facility]      Initial Encounter Date:  Flowsheet [facility] 10/30/2015 in [facility]  Date  10/30/15  Referring Provider  [facility]      Visit Diagnosis: Stable angina [facility])  Patient's Home Medications on Admission:  Current Outpatient Prescriptions:  .  amoxicillin-clavulanate (AUGMENTIN) 875-125 MG tablet, Take 1 tablet by mouth 2 (two) times daily., Disp: 20 tablet, Rfl: 0 .  aspirin 81 MG tablet, Take 81 mg by mouth daily., Disp: , Rfl:  .  FLUoxetine (PROZAC) 40 MG capsule, Take 40 mg by mouth daily., Disp: , Rfl:  .  Glucagon, rDNA, (GLUCAGON EMERGENCY IJ), Inject as directed as needed (for low blood sugar)., Disp: , Rfl:  .  insulin glargine (LANTUS) 100 UNIT/ML injection, Inject 13 Units into the skin daily. As needed if insulin pump is not working , Disp: , Rfl:  .  insulin lispro (HUMALOG) 100 UNIT/ML injection, Inject 13 Units into the skin over 24 hr. In insulin pump , Disp: , Rfl:  .  losartan (COZAAR) 25 MG tablet, Take 25 mg by mouth daily., Disp: , Rfl:  .  Magnesium 500 MG TABS, Take 500 mg by mouth daily. , Disp: , Rfl:  .  Melatonin 5 MG TABS, Take 10 mg by mouth at bedtime as needed (sleep). , Disp: , Rfl:  .  metoprolol tartrate (LOPRESSOR) 25 MG tablet, Take 12.5 mg by mouth daily. , Disp: , Rfl:  .  Multiple Vitamin (MULTIVITAMIN) capsule, Take 1 capsule by mouth daily., Disp: , Rfl:  .  ondansetron (ZOFRAN-ODT) 4 MG disintegrating tablet, Take 4 mg by mouth every 8 (eight) hours as needed for nausea or vomiting., Disp: , Rfl:  .  rosuvastatin (CRESTOR) 10 MG tablet, Take 10 mg by mouth daily., Disp: , Rfl:  .  traMADol (ULTRAM) 50 MG tablet, Take 1 tablet (50 mg total) by mouth every 6  (six) hours as needed., Disp: 30 tablet, Rfl: 0  Past Medical History: Past Medical History:  Diagnosis Date  . Coronary artery disease   . Diabetes mellitus without complication (HCC)    insulin pump  . Headache    since ear problem started  . Hypertension   . Myocardial infarction 11/01/13    Tobacco Use: History  Smoking Status  . Former Smoker  . Types: Cigarettes  . Quit date: 04/30/1998  Smokeless Tobacco  . Never Used    Labs: Recent Review Flowsheet Data    There is no flowsheet data to display.       Exercise Target Goals:    Exercise Program Goal: Individual exercise prescription set with THRR, safety & activity barriers. Participant demonstrates ability to understand and report RPE using BORG scale, to self-measure pulse accurately, and to acknowledge the importance of the exercise prescription.  Exercise Prescription Goal: Starting with aerobic activity 30 plus minutes a day, 3 days per week for initial exercise prescription. Provide home exercise prescription and guidelines that participant acknowledges understanding prior to discharge.  Activity Barriers & Risk Stratification:     Activity Barriers & Cardiac Risk Stratification - 10/30/15 1259      Activity Barriers & Cardiac Risk Stratification   Cardiac Risk Stratification High      6 Minute Walk:  Diana Frost 10/30/15 1313         6 Minute Walk   Distance 1250 feet     Walk Time 6 minutes     MPH 2.36     METS 2.77     RPE 12     Perceived Dyspnea  2     VO2 Peak 9.7     Symptoms No     Resting HR 98 bpm     Resting BP 92/60     Max Ex. HR 112 bpm     Max Ex. BP 104/58        Initial Exercise Prescription:     Initial Exercise Prescription - 10/30/15 1300      Date of Initial Exercise RX and Referring Provider   Date 10/30/15   Referring Provider Diana [facility]     Treadmill   MPH 2   Grade 0.5   Minutes 15   METs 2.67     Recumbant Bike   Level 1    RPM 60   Watts 10   Minutes 15   METs 2.52     NuStep   Level 1   Watts 10   Minutes 15   METs 2.52     REL-XR   Level 1   Watts 30   Minutes 15   METs 2.5     Biostep-RELP   Level 1   Watts 15   Minutes 15   METs 2.5     Prescription Details   Frequency (times per week) 3   Duration Progress to 45 minutes of aerobic exercise without signs/symptoms of physical distress     Intensity   THRR 40-80% of Max Heartrate 118-139   Ratings of Perceived Exertion 11-15   Perceived Dyspnea 0-4     Progression   Progression Continue to progress workloads to maintain intensity without signs/symptoms of physical distress.     Resistance Training   Training Prescription Yes   Weight 2   Reps 10-15      Perform Capillary Blood Glucose checks as needed.  Exercise Prescription Changes:   Exercise Comments:     Exercise Comments    Diana Frost 11/14/15 1527 11/28/15 1251 12/25/15 1420 01/08/16 1304     Exercise Comments Diana Frost is waiting unti lafter she rovers from surgery to begin Heart Track. Diana Frost plans to begin Heart Track after recovery from surgery for another medical issue. Diana Frost has not yet attended a Heart Track exercise session. Diana Frost has not yet attended Heart Track.       Discharge Exercise Prescription (Final Exercise Prescription Changes):   Nutrition:  Target Goals: Understanding of nutrition guidelines, daily intake of sodium 1500mg , cholesterol 200mg , calories 30% from fat and 7% or less from saturated fats, daily to have 5 or more servings of fruits and vegetables.  Biometrics:     Pre Biometrics - 10/30/15 1310      Pre Biometrics   Height 5' 1.4" (1.56 m)   Weight 127 lb 11.2 oz (57.9 kg)   Waist Circumference 32 inches   Hip Circumference 37 inches   Waist to Hip Ratio 0.86 %   BMI (Calculated) 23.9       Nutrition Therapy Plan and Nutrition Goals:     Nutrition Therapy & Goals - 10/30/15 1304      Nutrition Therapy    Drug/Food Interactions Statins/Certain Fruits     Personal Nutrition Goals   Personal Goal #1 --  Diana Frost is a diabetic on insulin. Her fasting blood sugar reported from home today was 126.      Intervention Plan   Intervention Prescribe, educate and counsel regarding individualized specific dietary modifications aiming towards targeted core components such as weight, hypertension, lipid management, diabetes, heart failure and other comorbidities.   Expected Outcomes Short Term Goal: Understand basic principles of dietary content, such as calories, fat, sodium, cholesterol and nutrients.;Short Term Goal: A plan has been developed with personal nutrition goals set during dietitian appointment.;Long Term Goal: Adherence to prescribed nutrition plan.      Nutrition Discharge: Rate Your Plate Scores:   Nutrition Goals Re-Evaluation:   Psychosocial: Target Goals: Acknowledge presence or absence of depression, maximize coping skills, provide positive support system. Participant is able to verbalize types and ability to use techniques and skills needed for reducing stress and depression.  Initial Review & Psychosocial Screening:     Initial Psych Review & Screening - 10/30/15 1305      Initial Review   Current issues with Current Stress Concerns   Source of Stress Concerns Chronic Illness   Comments Her husband has a chronic illness condition.      Family Dynamics   Good Support System? Yes     Barriers   Psychosocial barriers to participate in program The patient should benefit from training in stress management and relaxation.     Screening Interventions   Interventions Encouraged to exercise;Program counselor consult      Quality of Life Scores:     Quality of Life - 10/31/15 1148      Quality of Life Scores   Health/Function Pre 24 %   Socioeconomic Pre 27 %   Psych/Spiritual Pre 24.86 %   Family Pre 30 %   GLOBAL Pre 25.74 %      PHQ-9: Recent Review Flowsheet  Data    Depression screen [facility] 2/9 10/30/2015   Decreased Interest 1   Down, Depressed, Hopeless 1   PHQ - 2 Score 2   Altered sleeping 1   Tired, decreased energy 2   Change in appetite 1   Feeling bad or failure about yourself  1   Trouble concentrating 0   Moving slowly or fidgety/restless 0   Suicidal thoughts 0   PHQ-9 Score 7      Psychosocial Evaluation and Intervention:   Psychosocial Re-Evaluation:   Vocational Rehabilitation: Provide vocational rehab assistance to qualifying candidates.   Vocational Rehab Evaluation & Intervention:     Vocational Rehab - 10/30/15 1259      Initial Vocational Rehab Evaluation & Intervention   Assessment shows need for Vocational Rehabilitation No      Education: Education Goals: Education classes will be provided on a weekly basis, covering required topics. Participant will state understanding/return demonstration of topics presented.  Learning Barriers/Preferences:     Learning Barriers/Preferences - 10/31/15 1145      Learning Barriers/Preferences   Learning Barriers None   Learning Preferences None      Education Topics: General Nutrition Guidelines/Fats and Fiber: -Group instruction provided by verbal, written material, models and posters to present the general guidelines for heart healthy nutrition. Gives an explanation and review of dietary fats and fiber.   Controlling Sodium/Reading Food Labels: -Group verbal and written material supporting the discussion of sodium use in heart healthy nutrition. Review and explanation with models, verbal and written materials for utilization of the food label.   Exercise Physiology & Risk Factors: - Group verbal and written  instruction with models to review the exercise physiology of the cardiovascular system and associated critical values. Details cardiovascular disease risk factors and the goals associated with each risk factor.   Aerobic Exercise & Resistance  Training: - Gives group verbal and written discussion on the health impact of inactivity. On the components of aerobic and resistive training programs and the benefits of this training and how to safely progress through these programs.   Flexibility, Balance, General Exercise Guidelines: - Provides group verbal and written instruction on the benefits of flexibility and balance training programs. Provides general exercise guidelines with specific guidelines to those with heart or lung disease. Demonstration and skill practice provided.   Stress Management: - Provides group verbal and written instruction about the health risks of elevated stress, cause of high stress, and healthy ways to reduce stress.   Depression: - Provides group verbal and written instruction on the correlation between heart/lung disease and depressed mood, treatment options, and the stigmas associated with seeking treatment.   Anatomy & Physiology of the Heart: - Group verbal and written instruction and models provide basic cardiac anatomy and physiology, with the coronary electrical and arterial systems. Review of: AMI, Angina, Valve disease, Heart Failure, Cardiac Arrhythmia, Pacemakers, and the ICD.   Cardiac Procedures: - Group verbal and written instruction and models to describe the testing methods done to diagnose heart disease. Reviews the outcomes of the test results. Describes the treatment choices: Medical Management, Angioplasty, or Coronary Bypass Surgery.   Cardiac Medications: - Group verbal and written instruction to review commonly prescribed medications for heart disease. Reviews the medication, class of the drug, and side effects. Includes the steps to properly store meds and maintain the prescription regimen.   Go Sex-Intimacy & Heart Disease, Get SMART - Goal Setting: - Group verbal and written instruction through game format to discuss heart disease and the return to sexual intimacy. Provides  group verbal and written material to discuss and apply goal setting through the application of the S.M.A.R.T. Method.   Other Matters of the Heart: - Provides group verbal, written materials and models to describe Heart Failure, Angina, Valve Disease, and Diabetes in the realm of heart disease. Includes description of the disease process and treatment options available to the cardiac patient.   Exercise & Equipment Safety: - Individual verbal instruction and demonstration of equipment use and safety with use of the equipment. Flowsheet [facility] 10/30/2015 in [facility]  Date  10/30/15  Educator  C. EnterkinRN  Instruction Review Code  1- partially meets, needs review/practice      Infection Prevention: - Provides verbal and written material to individual with discussion of infection control including proper hand washing and proper equipment cleaning during exercise session. Flowsheet [facility]  Date  10/30/15  Educator  C. EnterkinRN  Instruction Review Code  2- meets goals/outcomes      Falls Prevention: - Provides verbal and written material to individual with discussion of falls prevention and safety. Flowsheet [facility] 10[facility]30/2015 in [facility]  Date  10/30/15  Educator  C. [place]  Instruction Review Code  2- meets goals/outcomes      Diabetes: - Individual verbal and written instruction to review signs/symptoms of diabetes, desired ranges of glucose level fasting, after meals and with exercise. Advice that pre and post exercise glucose checks will be done for 3 sessions at entry of program. Flowsheet  [facility] from 10/30/2015 in [facility]  Date  10/30/15  Educator  C.[place]  Instruction Review Code  1- partially meets, needs review/practice       Knowledge Questionnaire Score:     Knowledge  Questionnaire Score - 10/31/15 1145      Knowledge Questionnaire Score   Pre Score 20      Core Components/Risk Factors/Patient Goals at Admission:     Personal Goals and Risk Factors at Admission - 10/31/15 1147      Core Components/Risk Factors/Patient Goals on Admission   Improve shortness of breath with ADL's Yes   Intervention Provide education, individualized exercise plan and daily activity instruction to help decrease symptoms of SOB with activities of daily living.   Expected Outcomes Short Term: Achieves a reduction of symptoms when performing activities of daily living.   Develop more efficient breathing techniques such as purse lipped breathing and diaphragmatic breathing; and practicing self-pacing with activity Yes   Intervention Provide education, demonstration and support about specific breathing techniuqes utilized for more efficient breathing. Include techniques such as pursed lipped breathing, diaphragmatic breathing and self-pacing activity.   Expected Outcomes Short Term: Participant will be able to demonstrate and use breathing techniques as needed throughout daily activities.   Diabetes Yes      Core Components/Risk Factors/Patient Goals Review:    Core Components/Risk Factors/Patient Goals at Discharge (Final Review):    ITP Comments:     ITP Comments    [facility] 10/30/15 1302 10/30/15 1305 10/31/15 1159 10/31/15 1827 11/20/15 0821   ITP Comments Upon arrival Diana [facility] c/o being dizzy today and yesterday. I had her sit down and took her blood pressure it was 80/60 in her left arm and right arm 78/50 so given 480 cc of water. Heart rate 107. AFter the water her blood pressure was 92/60 and heart rate 102 but she said she felt ok so 6 minute walk in the hallway was done. I watched the heart monitor screen while REbecca Sicles timed her 6 minute walk test etd Diana Frost is a diabetic on insulin. 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 Continue with ITP unless changes noted by Medical Director at signature of review.  Remains out for medical reason   Diana Frost 11/28/15 1252 12/18/15 [id number] 12/25/15 1421 01/08/16 1304 01/09/16 1036   ITP Comments Diana Frost plans to begin Heart Track after recovery from surgery for another medical issue. 30 day review. Continue with ITP unless changes noted by Medical Director at signature of review. Remains absent for medical reassons Diana Frost has not yet attended a Heart Track exercise session. Diana Frost has not yet attended Heart Track. 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  She would like to begin Heart Track when she is finished with her treatment.   [place] [facility] 01/15/16 0652           ITP Comments 30 day review. Continue with ITP unless changes noted by Medical Director at signature of review. Remains out for medical reasons          Comments:

## 2016-01-16 ENCOUNTER — Ambulatory Visit: Payer: Medicare Other

## 2016-01-20 ENCOUNTER — Ambulatory Visit: Payer: Medicare Other

## 2016-01-22 ENCOUNTER — Ambulatory Visit: Payer: Medicare Other

## 2016-01-23 ENCOUNTER — Ambulatory Visit: Payer: Medicare Other

## 2016-01-27 ENCOUNTER — Ambulatory Visit: Payer: Medicare Other

## 2016-01-29 ENCOUNTER — Ambulatory Visit: Payer: Medicare Other

## 2016-01-30 ENCOUNTER — Ambulatory Visit: Payer: Medicare Other

## 2016-02-12 ENCOUNTER — Encounter: Payer: Self-pay | Admitting: *Deleted

## 2016-02-12 DIAGNOSIS — I208 Other forms of angina pectoris: Secondary | ICD-10-CM

## 2016-02-12 NOTE — Progress Notes (Signed)
Cardiac Individual Treatment Plan  Patient Details  Name: Diana Frost MRN: [id number] Date of Birth: 04/23/1945 Referring Provider:   Flowsheet Row Cardiac Rehab from 10/30/2015 in [facility] Cardiac and Pulmonary Rehab  Referring Provider  Sheppard Coil      Initial Encounter Date:  Flowsheet Row Cardiac Rehab from 10/30/2015 in [facility] Cardiac and Pulmonary Rehab  Date  10/30/15  Referring Provider  Sheppard Coil      Visit Diagnosis: Stable angina [facility])  Patient's Home Medications on Admission:  Current Outpatient Prescriptions:  .  amoxicillin-clavulanate (AUGMENTIN) 875-125 MG tablet, Take 1 tablet by mouth 2 (two) times daily., Disp: 20 tablet, Rfl: 0 .  aspirin 81 MG tablet, Take 81 mg by mouth daily., Disp: , Rfl:  .  FLUoxetine (PROZAC) 40 MG capsule, Take 40 mg by mouth daily., Disp: , Rfl:  .  Glucagon, rDNA, (GLUCAGON EMERGENCY IJ), Inject as directed as needed (for low blood sugar)., Disp: , Rfl:  .  insulin glargine (LANTUS) 100 UNIT/ML injection, Inject 13 Units into the skin daily. As needed if insulin pump is not working , Disp: , Rfl:  .  insulin lispro (HUMALOG) 100 UNIT/ML injection, Inject 13 Units into the skin over 24 hr. In insulin pump , Disp: , Rfl:  .  losartan (COZAAR) 25 MG tablet, Take 25 mg by mouth daily., Disp: , Rfl:  .  Magnesium 500 MG TABS, Take 500 mg by mouth daily. , Disp: , Rfl:  .  Melatonin 5 MG TABS, Take 10 mg by mouth at bedtime as needed (sleep). , Disp: , Rfl:  .  metoprolol tartrate (LOPRESSOR) 25 MG tablet, Take 12.5 mg by mouth daily. , Disp: , Rfl:  .  Multiple Vitamin (MULTIVITAMIN) capsule, Take 1 capsule by mouth daily., Disp: , Rfl:  .  ondansetron (ZOFRAN-ODT) 4 MG disintegrating tablet, Take 4 mg by mouth every 8 (eight) hours as needed for nausea or vomiting., Disp: , Rfl:  .  rosuvastatin (CRESTOR) 10 MG tablet, Take 10 mg by mouth daily., Disp: , Rfl:  .  traMADol (ULTRAM) 50 MG tablet, Take 1 tablet (50 mg total) by mouth every 6  (six) hours as needed., Disp: 30 tablet, Rfl: 0  Past Medical History: Past Medical History:  Diagnosis Date  . Coronary artery disease   . Diabetes mellitus without complication (HCC)    insulin pump  . Headache    since ear problem started  . Hypertension   . Myocardial infarction 11/01/13    Tobacco Use: History  Smoking Status  . Former Smoker  . Types: Cigarettes  . Quit date: 04/30/1998  Smokeless Tobacco  . Never Used    Labs: Recent Review Flowsheet Data    There is no flowsheet data to display.       Exercise Target Goals:    Exercise Program Goal: Individual exercise prescription set with THRR, safety & activity barriers. Participant demonstrates ability to understand and report RPE using BORG scale, to self-measure pulse accurately, and to acknowledge the importance of the exercise prescription.  Exercise Prescription Goal: Starting with aerobic activity 30 plus minutes a day, 3 days per week for initial exercise prescription. Provide home exercise prescription and guidelines that participant acknowledges understanding prior to discharge.  Activity Barriers & Risk Stratification:     Activity Barriers & Cardiac Risk Stratification - 10/30/15 1259      Activity Barriers & Cardiac Risk Stratification   Cardiac Risk Stratification High      6 Minute Walk:  [place] Name 10/30/15 1313         6 Minute Walk   Distance 1250 feet     Walk Time 6 minutes     MPH 2.36     METS 2.77     RPE 12     Perceived Dyspnea  2     VO2 Peak 9.7     Symptoms No     Resting HR 98 bpm     Resting BP 92/60     Max Ex. HR 112 bpm     Max Ex. BP 104/58        Initial Exercise Prescription:     Initial Exercise Prescription - 10/30/15 1300      Date of Initial Exercise RX and Referring Provider   Date 10/30/15   Referring Provider Sheppard Coil     Treadmill   MPH 2   Grade 0.5   Minutes 15   METs 2.67     Recumbant Bike   Level 1    RPM 60   Watts 10   Minutes 15   METs 2.52     NuStep   Level 1   Watts 10   Minutes 15   METs 2.52     REL-XR   Level 1   Watts 30   Minutes 15   METs 2.5     Biostep-RELP   Level 1   Watts 15   Minutes 15   METs 2.5     Prescription Details   Frequency (times per week) 3   Duration Progress to 45 minutes of aerobic exercise without signs/symptoms of physical distress     Intensity   THRR 40-80% of Max Heartrate 118-139   Ratings of Perceived Exertion 11-15   Perceived Dyspnea 0-4     Progression   Progression Continue to progress workloads to maintain intensity without signs/symptoms of physical distress.     Resistance Training   Training Prescription Yes   Weight 2   Reps 10-15      Perform Capillary Blood Glucose checks as needed.  Exercise Prescription Changes:   Exercise Comments:     Exercise Comments    Row Name 11/14/15 1527 11/28/15 1251 12/25/15 1420 01/08/16 1304     Exercise Comments Ms Damm is waiting unti lafter she rovers from surgery to begin Heart Track. Donzetta plans to begin Heart Track after recovery from surgery for another medical issue. Geena has not yet attended a Heart Track exercise session. Krishonda has not yet attended Heart Track.       Discharge Exercise Prescription (Final Exercise Prescription Changes):   Nutrition:  Target Goals: Understanding of nutrition guidelines, daily intake of sodium 1500mg , cholesterol 200mg , calories 30% from fat and 7% or less from saturated fats, daily to have 5 or more servings of fruits and vegetables.  Biometrics:     Pre Biometrics - 10/30/15 1310      Pre Biometrics   Height 5' 1.4" (1.56 m)   Weight 127 lb 11.2 oz (57.9 kg)   Waist Circumference 32 inches   Hip Circumference 37 inches   Waist to Hip Ratio 0.86 %   BMI (Calculated) 23.9       Nutrition Therapy Plan and Nutrition Goals:     Nutrition Therapy & Goals - 10/30/15 1304      Nutrition Therapy    Drug/Food Interactions Statins/Certain Fruits     Personal Nutrition Goals   Personal Goal #1 --  Deshelia is a diabetic on insulin. Her fasting blood sugar reported from home today was 126.      Intervention Plan   Intervention Prescribe, educate and counsel regarding individualized specific dietary modifications aiming towards targeted core components such as weight, hypertension, lipid management, diabetes, heart failure and other comorbidities.   Expected Outcomes Short Term Goal: Understand basic principles of dietary content, such as calories, fat, sodium, cholesterol and nutrients.;Short Term Goal: A plan has been developed with personal nutrition goals set during dietitian appointment.;Long Term Goal: Adherence to prescribed nutrition plan.      Nutrition Discharge: Rate Your Plate Scores:   Nutrition Goals Re-Evaluation:   Psychosocial: Target Goals: Acknowledge presence or absence of depression, maximize coping skills, provide positive support system. Participant is able to verbalize types and ability to use techniques and skills needed for reducing stress and depression.  Initial Review & Psychosocial Screening:     Initial Psych Review & Screening - 10/30/15 1305      Initial Review   Current issues with Current Stress Concerns   Source of Stress Concerns Chronic Illness   Comments Her husband has a chronic illness condition.      Family Dynamics   Good Support System? Yes     Barriers   Psychosocial barriers to participate in program The patient should benefit from training in stress management and relaxation.     Screening Interventions   Interventions Encouraged to exercise;Program counselor consult      Quality of Life Scores:     Quality of Life - 10/31/15 1148      Quality of Life Scores   Health/Function Pre 24 %   Socioeconomic Pre 27 %   Psych/Spiritual Pre 24.86 %   Family Pre 30 %   GLOBAL Pre 25.74 %      PHQ-9: Recent Review Flowsheet  Data    Depression screen [facility] 2/9 10/30/2015   Decreased Interest 1   Down, Depressed, Hopeless 1   PHQ - 2 Score 2   Altered sleeping 1   Tired, decreased energy 2   Change in appetite 1   Feeling bad or failure about yourself  1   Trouble concentrating 0   Moving slowly or fidgety/restless 0   Suicidal thoughts 0   PHQ-9 Score 7      Psychosocial Evaluation and Intervention:   Psychosocial Re-Evaluation:   Vocational Rehabilitation: Provide vocational rehab assistance to qualifying candidates.   Vocational Rehab Evaluation & Intervention:     Vocational Rehab - 10/30/15 1259      Initial Vocational Rehab Evaluation & Intervention   Assessment shows need for Vocational Rehabilitation No      Education: Education Goals: Education classes will be provided on a weekly basis, covering required topics. Participant will state understanding/return demonstration of topics presented.  Learning Barriers/Preferences:     Learning Barriers/Preferences - 10/31/15 1145      Learning Barriers/Preferences   Learning Barriers None   Learning Preferences None      Education Topics: General Nutrition Guidelines/Fats and Fiber: -Group instruction provided by verbal, written material, models and posters to present the general guidelines for heart healthy nutrition. Gives an explanation and review of dietary fats and fiber.   Controlling Sodium/Reading Food Labels: -Group verbal and written material supporting the discussion of sodium use in heart healthy nutrition. Review and explanation with models, verbal and written materials for utilization of the food label.   Exercise Physiology & Risk Factors: - Group verbal and written  instruction with models to review the exercise physiology of the cardiovascular system and associated critical values. Details cardiovascular disease risk factors and the goals associated with each risk factor.   Aerobic Exercise & Resistance  Training: - Gives group verbal and written discussion on the health impact of inactivity. On the components of aerobic and resistive training programs and the benefits of this training and how to safely progress through these programs.   Flexibility, Balance, General Exercise Guidelines: - Provides group verbal and written instruction on the benefits of flexibility and balance training programs. Provides general exercise guidelines with specific guidelines to those with heart or lung disease. Demonstration and skill practice provided.   Stress Management: - Provides group verbal and written instruction about the health risks of elevated stress, cause of high stress, and healthy ways to reduce stress.   Depression: - Provides group verbal and written instruction on the correlation between heart/lung disease and depressed mood, treatment options, and the stigmas associated with seeking treatment.   Anatomy & Physiology of the Heart: - Group verbal and written instruction and models provide basic cardiac anatomy and physiology, with the coronary electrical and arterial systems. Review of: AMI, Angina, Valve disease, Heart Failure, Cardiac Arrhythmia, Pacemakers, and the ICD.   Cardiac Procedures: - Group verbal and written instruction and models to describe the testing methods done to diagnose heart disease. Reviews the outcomes of the test results. Describes the treatment choices: Medical Management, Angioplasty, or Coronary Bypass Surgery.   Cardiac Medications: - Group verbal and written instruction to review commonly prescribed medications for heart disease. Reviews the medication, class of the drug, and side effects. Includes the steps to properly store meds and maintain the prescription regimen.   Go Sex-Intimacy & Heart Disease, Get SMART - Goal Setting: - Group verbal and written instruction through game format to discuss heart disease and the return to sexual intimacy. Provides  group verbal and written material to discuss and apply goal setting through the application of the S.M.A.R.T. Method.   Other Matters of the Heart: - Provides group verbal, written materials and models to describe Heart Failure, Angina, Valve Disease, and Diabetes in the realm of heart disease. Includes description of the disease process and treatment options available to the cardiac patient.   Exercise & Equipment Safety: - Individual verbal instruction and demonstration of equipment use and safety with use of the equipment. Flowsheet Row Cardiac Rehab from 10/30/2015 in [facility] Cardiac and Pulmonary Rehab  Date  10/30/15  Educator  C. EnterkinRN  Instruction Review Code  1- partially meets, needs review/practice      Infection Prevention: - Provides verbal and written material to individual with discussion of infection control including proper hand washing and proper equipment cleaning during exercise session. Flowsheet Row Cardiac Rehab from 10/30/2015 in [facility] Cardiac and Pulmonary Rehab  Date  10/30/15  Educator  C. EnterkinRN  Instruction Review Code  2- meets goals/outcomes      Falls Prevention: - Provides verbal and written material to individual with discussion of falls prevention and safety. Flowsheet Row Cardiac Rehab from 10/30/2015 in [facility] Cardiac and Pulmonary Rehab  Date  10/30/15  Educator  C. [place]  Instruction Review Code  2- meets goals/outcomes      Diabetes: - Individual verbal and written instruction to review signs/symptoms of diabetes, desired ranges of glucose level fasting, after meals and with exercise. Advice that pre and post exercise glucose checks will be done for 3 sessions at entry of program. Flowsheet  Row Cardiac Rehab from 10/30/2015 in [facility] Cardiac and Pulmonary Rehab  Date  10/30/15  Educator  C.[place]  Instruction Review Code  1- partially meets, needs review/practice       Knowledge Questionnaire Score:     Knowledge  Questionnaire Score - 10/31/15 1145      Knowledge Questionnaire Score   Pre Score 20      Core Components/Risk Factors/Patient Goals at Admission:     Personal Goals and Risk Factors at Admission - 10/31/15 1147      Core Components/Risk Factors/Patient Goals on Admission   Improve shortness of breath with ADL's Yes   Intervention Provide education, individualized exercise plan and daily activity instruction to help decrease symptoms of SOB with activities of daily living.   Expected Outcomes Short Term: Achieves a reduction of symptoms when performing activities of daily living.   Develop more efficient breathing techniques such as purse lipped breathing and diaphragmatic breathing; and practicing self-pacing with activity Yes   Intervention Provide education, demonstration and support about specific breathing techniuqes utilized for more efficient breathing. Include techniques such as pursed lipped breathing, diaphragmatic breathing and self-pacing activity.   Expected Outcomes Short Term: Participant will be able to demonstrate and use breathing techniques as needed throughout daily activities.   Diabetes Yes      Core Components/Risk Factors/Patient Goals Review:    Core Components/Risk Factors/Patient Goals at Discharge (Final Review):    ITP Comments:     ITP Comments    Row Name 10/30/15 1302 10/30/15 1305 10/31/15 1159 10/31/15 1827 11/20/15 0821   ITP Comments Upon arrival Zaelee c/o being dizzy today and yesterday. I had her sit down and took her blood pressure it was 80/60 in her left arm and right arm 78/50 so given 480 cc of water. Heart rate 107. AFter the water her blood pressure was 92/60 and heart rate 102 but she said she felt ok so 6 minute walk in the hallway was done. I watched the heart monitor screen while REbecca Sicles timed her 6 minute walk test etd Antwanique is a diabetic on insulin. 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 Continue with ITP unless changes noted by Medical Director at signature of review.  Remains out for medical reason   Row Name 11/28/15 1252 12/18/15 [id number] 12/25/15 1421 01/08/16 1304 01/09/16 1036   ITP Comments Katharine Look plans to begin Heart Track after recovery from surgery for another medical issue. 30 day review. Continue with ITP unless changes noted by Medical Director at signature of review. Remains absent for medical reassons Diany has not yet attended a Heart Track exercise session. [place] has not yet attended Heart Track. 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  She would like to begin Heart Track when she is finished with her treatment.   [place] Name 01/15/16 [phone] 02/12/16 1056         ITP Comments 30 day review. Continue with ITP unless changes noted by Medical Director at signature of review. Remains out for medical reasons discharged         Comments:

## 2016-04-15 ENCOUNTER — Emergency Department
Admission: EM | Admit: 2016-04-15 | Discharge: 2016-04-15 | Disposition: A | Discharge status: Home / Self Care | Payer: Medicare Other | Attending: Emergency Medicine | Admitting: Emergency Medicine

## 2016-04-15 ENCOUNTER — Encounter: Payer: Self-pay | Admitting: Emergency Medicine

## 2016-04-15 DIAGNOSIS — C499 Malignant neoplasm of connective and soft tissue, unspecified: Secondary | ICD-10-CM | POA: Insufficient documentation

## 2016-04-15 DIAGNOSIS — R110 Nausea: Secondary | ICD-10-CM | POA: Diagnosis not present

## 2016-04-15 DIAGNOSIS — Z5321 Procedure and treatment not carried out due to patient leaving prior to being seen by health care provider: Secondary | ICD-10-CM | POA: Diagnosis not present

## 2016-04-15 NOTE — ED Triage Notes (Signed)
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  Today having n/v.

## 2016-06-23 ENCOUNTER — Other Ambulatory Visit: Payer: Self-pay | Admitting: Radiation Oncology

## 2016-06-23 DIAGNOSIS — C7949 Secondary malignant neoplasm of other parts of nervous system: Secondary | ICD-10-CM

## 2016-06-23 DIAGNOSIS — C410 Malignant neoplasm of bones of skull and face: Secondary | ICD-10-CM

## 2016-06-23 DIAGNOSIS — C7989 Secondary malignant neoplasm of other specified sites: Secondary | ICD-10-CM

## 2016-08-12 ENCOUNTER — Inpatient Hospital Stay
Admission: EM | Admit: 2016-08-12 | Discharge: 2016-08-15 | DRG: 637 | Disposition: A | Discharge status: Home / Self Care | Payer: Medicare Other | Attending: Internal Medicine | Admitting: Internal Medicine

## 2016-08-12 DIAGNOSIS — Z9841 Cataract extraction status, right eye: Secondary | ICD-10-CM

## 2016-08-12 DIAGNOSIS — E1051 Type 1 diabetes mellitus with diabetic peripheral angiopathy without gangrene: Secondary | ICD-10-CM | POA: Diagnosis present

## 2016-08-12 DIAGNOSIS — Z90710 Acquired absence of both cervix and uterus: Secondary | ICD-10-CM

## 2016-08-12 DIAGNOSIS — I252 Old myocardial infarction: Secondary | ICD-10-CM

## 2016-08-12 DIAGNOSIS — T85898A Other specified complication of other internal prosthetic devices, implants and grafts, initial encounter: Secondary | ICD-10-CM | POA: Diagnosis present

## 2016-08-12 DIAGNOSIS — X58XXXA Exposure to other specified factors, initial encounter: Secondary | ICD-10-CM | POA: Diagnosis present

## 2016-08-12 DIAGNOSIS — Z888 Allergy status to other drugs, medicaments and biological substances status: Secondary | ICD-10-CM

## 2016-08-12 DIAGNOSIS — Z85820 Personal history of malignant melanoma of skin: Secondary | ICD-10-CM

## 2016-08-12 DIAGNOSIS — E871 Hypo-osmolality and hyponatremia: Secondary | ICD-10-CM | POA: Diagnosis present

## 2016-08-12 DIAGNOSIS — E875 Hyperkalemia: Secondary | ICD-10-CM | POA: Diagnosis present

## 2016-08-12 DIAGNOSIS — E0810 Diabetes mellitus due to underlying condition with ketoacidosis without coma: Secondary | ICD-10-CM | POA: Diagnosis not present

## 2016-08-12 DIAGNOSIS — Z9641 Presence of insulin pump (external) (internal): Secondary | ICD-10-CM | POA: Diagnosis present

## 2016-08-12 DIAGNOSIS — Z87891 Personal history of nicotine dependence: Secondary | ICD-10-CM

## 2016-08-12 DIAGNOSIS — F0390 Unspecified dementia without behavioral disturbance: Secondary | ICD-10-CM | POA: Diagnosis present

## 2016-08-12 DIAGNOSIS — C419 Malignant neoplasm of bone and articular cartilage, unspecified: Secondary | ICD-10-CM | POA: Diagnosis present

## 2016-08-12 DIAGNOSIS — Z882 Allergy status to sulfonamides status: Secondary | ICD-10-CM

## 2016-08-12 DIAGNOSIS — Z885 Allergy status to narcotic agent status: Secondary | ICD-10-CM | POA: Diagnosis not present

## 2016-08-12 DIAGNOSIS — I959 Hypotension, unspecified: Secondary | ICD-10-CM | POA: Diagnosis present

## 2016-08-12 DIAGNOSIS — E440 Moderate protein-calorie malnutrition: Secondary | ICD-10-CM | POA: Diagnosis present

## 2016-08-12 DIAGNOSIS — N179 Acute kidney failure, unspecified: Secondary | ICD-10-CM | POA: Diagnosis present

## 2016-08-12 DIAGNOSIS — Z833 Family history of diabetes mellitus: Secondary | ICD-10-CM

## 2016-08-12 DIAGNOSIS — Z803 Family history of malignant neoplasm of breast: Secondary | ICD-10-CM

## 2016-08-12 DIAGNOSIS — I25118 Atherosclerotic heart disease of native coronary artery with other forms of angina pectoris: Secondary | ICD-10-CM | POA: Diagnosis not present

## 2016-08-12 DIAGNOSIS — E109 Type 1 diabetes mellitus without complications: Secondary | ICD-10-CM | POA: Diagnosis present

## 2016-08-12 DIAGNOSIS — E876 Hypokalemia: Secondary | ICD-10-CM | POA: Diagnosis present

## 2016-08-12 DIAGNOSIS — Z884 Allergy status to anesthetic agent status: Secondary | ICD-10-CM | POA: Diagnosis not present

## 2016-08-12 DIAGNOSIS — E10319 Type 1 diabetes mellitus with unspecified diabetic retinopathy without macular edema: Secondary | ICD-10-CM | POA: Diagnosis present

## 2016-08-12 DIAGNOSIS — C439 Malignant melanoma of skin, unspecified: Secondary | ICD-10-CM | POA: Diagnosis present

## 2016-08-12 DIAGNOSIS — F329 Major depressive disorder, single episode, unspecified: Secondary | ICD-10-CM | POA: Diagnosis present

## 2016-08-12 DIAGNOSIS — I10 Essential (primary) hypertension: Secondary | ICD-10-CM | POA: Diagnosis present

## 2016-08-12 DIAGNOSIS — I2510 Atherosclerotic heart disease of native coronary artery without angina pectoris: Secondary | ICD-10-CM | POA: Diagnosis present

## 2016-08-12 DIAGNOSIS — Z801 Family history of malignant neoplasm of trachea, bronchus and lung: Secondary | ICD-10-CM

## 2016-08-12 DIAGNOSIS — Z9842 Cataract extraction status, left eye: Secondary | ICD-10-CM

## 2016-08-12 DIAGNOSIS — E1310 Other specified diabetes mellitus with ketoacidosis without coma: Secondary | ICD-10-CM

## 2016-08-12 DIAGNOSIS — E1010 Type 1 diabetes mellitus with ketoacidosis without coma: Secondary | ICD-10-CM | POA: Diagnosis present

## 2016-08-12 DIAGNOSIS — Z887 Allergy status to serum and vaccine status: Secondary | ICD-10-CM

## 2016-08-12 DIAGNOSIS — E1110 Type 2 diabetes mellitus with ketoacidosis without coma: Secondary | ICD-10-CM | POA: Diagnosis present

## 2016-08-12 DIAGNOSIS — Z79899 Other long term (current) drug therapy: Secondary | ICD-10-CM

## 2016-08-12 DIAGNOSIS — Z66 Do not resuscitate: Secondary | ICD-10-CM | POA: Diagnosis present

## 2016-08-12 DIAGNOSIS — Z794 Long term (current) use of insulin: Secondary | ICD-10-CM

## 2016-08-12 DIAGNOSIS — R739 Hyperglycemia, unspecified: Secondary | ICD-10-CM | POA: Diagnosis present

## 2016-08-12 DIAGNOSIS — E785 Hyperlipidemia, unspecified: Secondary | ICD-10-CM | POA: Diagnosis present

## 2016-08-12 DIAGNOSIS — I214 Non-ST elevation (NSTEMI) myocardial infarction: Secondary | ICD-10-CM | POA: Diagnosis not present

## 2016-08-12 DIAGNOSIS — Z85830 Personal history of malignant neoplasm of bone: Secondary | ICD-10-CM

## 2016-08-12 DIAGNOSIS — R7989 Other specified abnormal findings of blood chemistry: Secondary | ICD-10-CM | POA: Diagnosis not present

## 2016-08-12 DIAGNOSIS — Z7982 Long term (current) use of aspirin: Secondary | ICD-10-CM | POA: Diagnosis not present

## 2016-08-12 DIAGNOSIS — Z923 Personal history of irradiation: Secondary | ICD-10-CM

## 2016-08-12 DIAGNOSIS — Z955 Presence of coronary angioplasty implant and graft: Secondary | ICD-10-CM

## 2016-08-12 LAB — COMPREHENSIVE METABOLIC PANEL
ALT: 13 U/L — AB (ref 14–54)
ANION GAP: 24 — AB (ref 5–15)
AST: 63 U/L — ABNORMAL HIGH (ref 15–41)
Albumin: 3.6 g/dL (ref 3.5–5.0)
Alkaline Phosphatase: 75 U/L (ref 38–126)
BUN: 24 mg/dL — ABNORMAL HIGH (ref 6–20)
CHLORIDE: 96 mmol/L — AB (ref 101–111)
CO2: 12 mmol/L — AB (ref 22–32)
Calcium: 8.6 mg/dL — ABNORMAL LOW (ref 8.9–10.3)
Creatinine, Ser: 1.41 mg/dL — ABNORMAL HIGH (ref 0.44–1.00)
GFR calc non Af Amer: 37 mL/min — ABNORMAL LOW (ref >60)
GFR, EST AFRICAN AMERICAN: 42 mL/min — AB (ref >60)
Glucose, Bld: 704 mg/dL (ref 65–99)
POTASSIUM: 5.9 mmol/L — AB (ref 3.5–5.1)
Sodium: 132 mmol/L — ABNORMAL LOW (ref 135–145)
Total Bilirubin: 1.9 mg/dL — ABNORMAL HIGH (ref 0.3–1.2)
Total Protein: 6.3 g/dL — ABNORMAL LOW (ref 6.5–8.1)

## 2016-08-12 LAB — CBC
HCT: 33.3 % — ABNORMAL LOW (ref 35.0–47.0)
HEMOGLOBIN: 10.9 g/dL — AB (ref 12.0–16.0)
MCH: 33 pg (ref 26.0–34.0)
MCHC: 32.6 g/dL (ref 32.0–36.0)
MCV: 101.3 fL — AB (ref 80.0–100.0)
Platelets: 221 10*3/uL (ref 150–440)
RBC: 3.29 MIL/uL — AB (ref 3.80–5.20)
RDW: 13.5 % (ref 11.5–14.5)
WBC: 12.1 10*3/uL — ABNORMAL HIGH (ref 3.6–11.0)

## 2016-08-12 LAB — LIPASE, BLOOD: LIPASE: 14 U/L (ref 11–51)

## 2016-08-12 LAB — GLUCOSE, CAPILLARY: Glucose-Capillary: >600 mg/dL (ref 65–99)

## 2016-08-12 MED ORDER — SODIUM CHLORIDE 0.9 % IV SOLN
INTRAVENOUS | Status: DC
  Administered 2016-08-12: 5.4 [IU]/h via INTRAVENOUS
  Filled 2016-08-12: qty 1

## 2016-08-12 MED ORDER — ROSUVASTATIN CALCIUM 20 MG PO TABS
10.0000 mg | ORAL_TABLET | Freq: Every evening | ORAL | Status: DC
  Administered 2016-08-13: 10 mg via ORAL
  Filled 2016-08-12: qty 1

## 2016-08-12 MED ORDER — DEXTROSE-NACL 5-0.45 % IV SOLN
INTRAVENOUS | Status: DC
  Administered 2016-08-13: 05:00:00 via INTRAVENOUS

## 2016-08-12 MED ORDER — SODIUM CHLORIDE 0.9 % IV SOLN: INTRAVENOUS | Status: AC

## 2016-08-12 MED ORDER — FLUOXETINE HCL 20 MG PO CAPS
40.0000 mg | ORAL_CAPSULE | Freq: Every evening | ORAL | Status: DC
  Administered 2016-08-13 – 2016-08-14 (×2): 40 mg via ORAL
  Filled 2016-08-12 (×3): qty 2

## 2016-08-12 MED ORDER — INSULIN REGULAR HUMAN 100 UNIT/ML IJ SOLN
INTRAMUSCULAR | Status: DC
  Filled 2016-08-12: qty 1

## 2016-08-12 MED ORDER — SODIUM BICARBONATE 8.4 % IV SOLN
50.0000 meq | Freq: Once | INTRAVENOUS | Status: AC
  Administered 2016-08-12: 50 meq via INTRAVENOUS

## 2016-08-12 MED ORDER — SODIUM BICARBONATE 8.4 % IV SOLN
INTRAVENOUS | Status: AC
  Filled 2016-08-12: qty 50

## 2016-08-12 MED ORDER — SODIUM CHLORIDE 0.9 % IV SOLN
Freq: Once | INTRAVENOUS | Status: AC
  Administered 2016-08-13: via INTRAVENOUS

## 2016-08-12 MED ORDER — ASPIRIN 81 MG PO TABS
81.0000 mg | ORAL_TABLET | Freq: Every day | ORAL | Status: DC
  Filled 2016-08-12: qty 1

## 2016-08-12 MED ORDER — ENOXAPARIN SODIUM 30 MG/0.3ML ~~LOC~~ SOLN
30.0000 mg | SUBCUTANEOUS | Status: DC
  Administered 2016-08-13: 30 mg via SUBCUTANEOUS
  Filled 2016-08-12: qty 0.3

## 2016-08-12 MED ORDER — SODIUM CHLORIDE 0.9 % IV SOLN
INTRAVENOUS | Status: DC
  Administered 2016-08-13: 01:00:00 via INTRAVENOUS

## 2016-08-12 MED ORDER — ONDANSETRON HCL 4 MG/2ML IJ SOLN
4.0000 mg | Freq: Once | INTRAMUSCULAR | Status: AC
  Administered 2016-08-12: 4 mg via INTRAVENOUS
  Filled 2016-08-12: qty 2

## 2016-08-12 NOTE — H&P (Signed)
History and Physical   [facility] - [place] @ [facility] Admission History and Physical [employer], D.O.    Patient Name: Diana Frost MR#: [id number] Date of Birth: [birth year] Date of Admission: 08/12/2016  Referring MD/NP/PA: Dr. Archie Balboa Primary Care Physician: Ricardo Jericho, NP Patient coming from: Home   Chief Complaint:  Chief Complaint  Patient presents with  . Hyperglycemia  . Emesis  . Abdominal Pain  Please note the entire history is obtained from the patient's emergency department chart, emergency department provider. Patient's personal history is limited by dementia and altered mental status.   HPI: Diana Frost is a 71 y.o. female with a known history of CAD s/p MI, DM, HTN presents to the emergency department for evaluation of Altered mental status.  Patient was in a usual state of health until today when she was found to have increased weakness and lethargy associated with elevated blood sugar. In the emergency department she was found to be in DKA for which admission was requested.  EMS/ED Course: Patient received IV insulin, Zofran, sodium bicarb.  Review of Systems:  Unable to obtain secondary to altered mental status   Past Medical History:  Diagnosis Date  . Coronary artery disease   . Diabetes mellitus without complication (HCC)    insulin pump  . Headache    since ear problem started  . Hypertension   . Myocardial infarction ([place]) 11/01/13    Past Surgical History:  Procedure Laterality Date  . ABDOMINAL HYSTERECTOMY    . BACK SURGERY     tail bone removed after fall/fracture  . CATARACT EXTRACTION BILATERAL W/ ANTERIOR VITRECTOMY    . CORONARY ANGIOPLASTY WITH STENT PLACEMENT  11/01/13   [facility], stent x2  . MELANOMA EXCISION Right    arm with skin graft  . MYRINGOTOMY WITH TUBE PLACEMENT Left 08/21/2015   Procedure: MYRINGOTOMY WITH TUBE PLACEMENT;  Surgeon: Carloyn Manner, MD;  Location: [facility];   Service: ENT;  Laterality: Left;  . NASOPHARYNGEAL BIOPSY N/A 11/07/2015   Procedure: NASOPHARYNGEAL BIOPSY;  Surgeon: Carloyn Manner, MD;  Location: [facility];  Service: ENT;  Laterality: N/A;  . TONSILLECTOMY       reports that she quit smoking about 18 years ago. Her smoking use included Cigarettes. She has never used smokeless tobacco. She reports that she drinks about 10.2 oz of alcohol per week . She reports that she does not use drugs.  Allergies  Allergen Reactions  . Influenza Vaccines Shortness Of Breath  . Percocet [Oxycodone-Acetaminophen] Nausea And Vomiting  . Statins     Joint pain  . Sulfa Antibiotics Nausea And Vomiting    Family History   Medical History Relation Name Comments  Cancer Father    Cancer Maternal Aunt     Relation Name Status Comments  Father  Deceased   Maternal Aunt     Mother  Deceased    Family History   Medical History Relation Name Comments  Lung cancer Father    Breast cancer Maternal Aunt    Breast cancer Maternal Grandfather    Diabetes type II Maternal Grandfather    Glaucoma Neg Hx    Macular degeneration Neg Hx    Retinal degeneration Neg Hx    Retinopathy of prematurity Neg Hx     Relation Name Status Comments  Father     Maternal Aunt     Maternal Grandfather     Mother  Deceased      Prior to Admission medications  Medication Sig Start Date End Date Taking? Authorizing Provider  aspirin 81 MG tablet Take 81 mg by mouth daily.   Yes [provider]  FLUoxetine (PROZAC) 40 MG capsule Take 40 mg by mouth every evening.    Yes [provider]  insulin glargine (LANTUS) 100 UNIT/ML injection Inject 13 Units into the skin daily as needed (If pump does not lower blood sugars). As needed if insulin pump is not working    Yes [provider]  insulin lispro (HUMALOG) 100 UNIT/ML injection Inject 13 Units into the skin over 24 hr. In insulin pump    Yes [provider]  ondansetron (ZOFRAN-ODT) 4 MG disintegrating tablet Take 4 mg by mouth every 8 (eight) hours as needed for nausea or vomiting.   Yes [provider]  rosuvastatin (CRESTOR) 10 MG tablet Take 10 mg by mouth every evening.    Yes [provider]  amoxicillin-clavulanate (AUGMENTIN) 875-125 MG tablet Take 1 tablet by mouth 2 (two) times daily. Patient not taking: Reported on 08/12/2016 11/07/15   Carloyn Manner, MD  Glucagon, rDNA, (GLUCAGON EMERGENCY IJ) Inject as directed as needed (for low blood sugar).    [provider]  traMADol (ULTRAM) 50 MG tablet Take 1 tablet (50 mg total) by mouth every 6 (six) hours as needed. Patient not taking: Reported on 08/12/2016 11/07/15   Carloyn Manner, MD    Physical Exam: Vitals:   08/12/16 2120 08/12/16 2133 08/12/16 2140 08/12/16 2206  BP: (!) 73/39 (!) 83/41 (!) 75/40 (!) 64/38  Pulse:      Resp: 16 (!) 21 (!) 21 19  Temp:      TempSrc:      SpO2:      Weight:      Height:        GENERAL: 71 y.o.-year-old female patient, Chronically ill-appearing. Lethargic but awakens to verbal stimuli. Minimally interactive HEENT: Head atraumatic, normocephalic. Pupils equal, round, reactive to light and accommodation. Mucus membranes moist. NECK: Supple CHEST: Normal breath sounds bilaterally. No wheezing, rales, rhonchi or crackles. No use of accessory muscles of respiration.  No reproducible chest wall tenderness.  CARDIOVASCULAR: S1, S2 normal. No murmurs, rubs, or gallops. Cap refill <2 seconds. Pulses intact distally.  ABDOMEN: Soft, nondistended, nontender. No rebound, guarding, rigidity. Normoactive bowel sounds present in all four quadrants. No organomegaly or mass. EXTREMITIES: No pedal edema, cyanosis, or clubbing. No calf tenderness or Homan's sign.  NEUROLOGIC: The patient is arousable but cannot comply with full neurologic exam. Follows some commands. SKIN: Warm, dry, and intact without obvious rash,  lesion, or ulcer.    Labs on Admission:  CBC:  Recent Labs Lab 08/12/16 2100  WBC 12.1*  HGB 10.9*  HCT 33.3*  MCV 101.3*  PLT 941   Basic Metabolic Panel:  Recent Labs Lab 08/12/16 2100  NA 132*  K 5.9*  CL 96*  CO2 12*  GLUCOSE 704*  BUN 24*  CREATININE 1.41*  CALCIUM 8.6*   GFR: Estimated Creatinine Clearance: 24.9 mL/min (A) (by C-G formula based on SCr of 1.41 mg/dL (H)). Liver Function Tests:  Recent Labs Lab 08/12/16 2100  AST 63*  ALT 13*  ALKPHOS 75  BILITOT 1.9*  PROT 6.3*  ALBUMIN 3.6    Recent Labs Lab 08/12/16 2100  LIPASE 14   No results for input(s): AMMONIA in the last 168 hours. Coagulation Profile: No results for input(s): INR, PROTIME in the last 168 hours. Cardiac Enzymes: No results for input(s): CKTOTAL,  CKMB, CKMBINDEX, TROPONINI in the last 168 hours. BNP (last 3 results) No results for input(s): PROBNP in the last 8760 hours. HbA1C: No results for input(s): HGBA1C in the last 72 hours. CBG:  Recent Labs Lab 08/12/16 2059  GLUCAP >600*   Lipid Profile: No results for input(s): CHOL, HDL, LDLCALC, TRIG, CHOLHDL, LDLDIRECT in the last 72 hours. Thyroid Function Tests: No results for input(s): TSH, T4TOTAL, FREET4, T3FREE, THYROIDAB in the last 72 hours. Anemia Panel: No results for input(s): VITAMINB12, FOLATE, FERRITIN, TIBC, IRON, RETICCTPCT in the last 72 hours. Urine analysis: No results found for: COLORURINE, APPEARANCEUR, LABSPEC, PHURINE, GLUCOSEU, HGBUR, BILIRUBINUR, KETONESUR, PROTEINUR, UROBILINOGEN, NITRITE, LEUKOCYTESUR Sepsis Labs: @LABRCNTIP (procalcitonin:4,lacticidven:4) )No results found for this or any previous visit (from the past 240 hour(s)).   Radiological Exams on Admission: No results found.  EKG: Pending  Assessment/Plan  This is a 71 y.o. female with a history of CAD s/p MI, DM, HTN now being admitted with:  #. DKA with associated left foot derangements - Admit ICU - Insulin drip  per protocol - IV fluid hydration - Electrolyte replacement as needed  #. CAD s/p MI - Continue aspirin  #. History of depression - Continue Prozac  #. History of HLD - Continue Crestor  Admission status: Inpatient, ICU IV Fluids: NS with bicarb Diet/Nutrition: NPO Consults called: CCM (d/w Dr. Halford Chessman E-Link) DVT Px: Lovenox, SCDs and early ambulation. Code Status: DNR Disposition Plan: To home in 1-2 days  All the records are reviewed and case discussed with ED provider. Management plans discussed with the patient and/or family who express understanding and agree with plan of care.  Kessie Croston D.O. on 08/12/2016 at 10:55 PM Between 7am to 6pm - Pager - [phone] After 6pm go to [email or web address] - [employer] executive [place] [facility] Office [phone] CC: Primary care physician; Ricardo Jericho, NP   08/12/2016, 10:55 PM

## 2016-08-12 NOTE — ED Notes (Addendum)
Pt arrived EMS. EMS reported that she was found conscious yet not fully alert. They checked her sugar and it was "HIGH". Her VS were BP-70/42 HR-109 CO2-25  O2-99% on 3 L. She has a 20G in her L AC. She was given 669ml NS before arriving to hospital. On arrival pt alert.

## 2016-08-12 NOTE — ED Notes (Signed)
Report called to ccu nurse barbara rn   Insulin pump with pt to ccu. Iv fluids infusing. primedoc in with pt prior to going with pt to ccu.

## 2016-08-12 NOTE — ED Notes (Signed)
Pt daughter Leverne Humbles left her cell number to reach her [phone]

## 2016-08-12 NOTE — ED Notes (Addendum)
Pt offered bedpan but unable to void at this time

## 2016-08-12 NOTE — ED Notes (Addendum)
Pt is extremely confused and disoriented. Insulin drip is started and her insulin pump has been discontinued. IV fluids infusing.

## 2016-08-12 NOTE — ED Triage Notes (Signed)
Pt arrived EMS. EMS reported that she was found conscious yet not fully alert. They checked her sugar and it was "HIGH". Her VS were BP-70/42 HR-109 CO2-25  O2-99% on 3 L. She has a 20G in her L AC. She was given 619ml NS before arriving to hospital. On arrival pt alert.

## 2016-08-12 NOTE — ED Provider Notes (Signed)
[facility] Emergency Department Provider Note   ____________________________________________   I have reviewed the triage vital signs and the nursing notes.   HISTORY  Chief Complaint Hyperglycemia; Emesis; and Abdominal Pain   History limited by: Altered Mental Status   HPI Diana Frost is a 71 y.o. female who presents to the emergency department today because of some concern for altered mental status and found to have extremely elevated blood sugar. Patient states that she has a history of diabetes and recently had a change to it sounds like the pump. Important the patient cannot give a great history. Did have a discussion with the patient's daughter. Patient's daughter states patient has been quite sick recently. The daughter states that the patient is a DO NOT RESUSCITATE.   Past Medical History:  Diagnosis Date  . Coronary artery disease   . Diabetes mellitus without complication (HCC)    insulin pump  . Headache    since ear problem started  . Hypertension   . Myocardial infarction [facility]) 11/01/13    Patient Active Problem List   Diagnosis Date Noted  . Melanoma ([place]) 11/01/2015  . Stable angina ([place]) 10/10/2015  . Coronary artery disease involving native coronary artery of native heart 11/16/2012  . History of insertion of insulin pump 05/24/2012  . Type 1 diabetes mellitus ([place]) 02/11/2011  . Depression 02/11/2011    Past Surgical History:  Procedure Laterality Date  . ABDOMINAL HYSTERECTOMY    . BACK SURGERY     tail bone removed after fall/fracture  . CATARACT EXTRACTION BILATERAL W/ ANTERIOR VITRECTOMY    . CORONARY ANGIOPLASTY WITH STENT PLACEMENT  11/01/13   [facility], stent x2  . MELANOMA EXCISION Right    arm with skin graft  . MYRINGOTOMY WITH TUBE PLACEMENT Left 08/21/2015   Procedure: MYRINGOTOMY WITH TUBE PLACEMENT;  Surgeon: Carloyn Manner, MD;  Location: [facility];  Service: ENT;  Laterality: Left;  .  NASOPHARYNGEAL BIOPSY N/A 11/07/2015   Procedure: NASOPHARYNGEAL BIOPSY;  Surgeon: Carloyn Manner, MD;  Location: [facility];  Service: ENT;  Laterality: N/A;  . TONSILLECTOMY      Prior to Admission medications   Medication Sig Start Date End Date Taking? Authorizing Provider  amoxicillin-clavulanate (AUGMENTIN) 875-125 MG tablet Take 1 tablet by mouth 2 (two) times daily. 11/07/15   Carloyn Manner, MD  aspirin 81 MG tablet Take 81 mg by mouth daily.    [provider]  FLUoxetine (PROZAC) 40 MG capsule Take 40 mg by mouth daily.    [provider]  Glucagon, rDNA, (GLUCAGON EMERGENCY IJ) Inject as directed as needed (for low blood sugar).    [provider]  insulin glargine (LANTUS) 100 UNIT/ML injection Inject 13 Units into the skin daily. As needed if insulin pump is not working     [provider]  insulin lispro (HUMALOG) 100 UNIT/ML injection Inject 13 Units into the skin over 24 hr. In insulin pump     [provider]  losartan (COZAAR) 25 MG tablet Take 25 mg by mouth daily.    [provider]  Magnesium 500 MG TABS Take 500 mg by mouth daily.     [provider]  Melatonin 5 MG TABS Take 10 mg by mouth at bedtime as needed (sleep).     [provider]  metoprolol tartrate (LOPRESSOR) 25 MG tablet Take 12.5 mg by mouth daily.     [provider]  Multiple Vitamin (MULTIVITAMIN) capsule Take 1  capsule by mouth daily.    [provider]  ondansetron (ZOFRAN-ODT) 4 MG disintegrating tablet Take 4 mg by mouth every 8 (eight) hours as needed for nausea or vomiting.    [provider]  rosuvastatin (CRESTOR) 10 MG tablet Take 10 mg by mouth daily.    [provider]  traMADol (ULTRAM) 50 MG tablet Take 1 tablet (50 mg total) by mouth every 6 (six) hours as needed. 11/07/15   Carloyn Manner, MD    Allergies Influenza vaccines; Percocet [oxycodone-acetaminophen]; Statins; and Sulfa  antibiotics  History reviewed. No pertinent family history.  Social History Social History  Substance Use Topics  . Smoking status: Former Smoker    Types: Cigarettes    Quit date: 04/30/1998  . Smokeless tobacco: Never Used  . Alcohol use 10.2 oz/week    17 Glasses of wine per week     Comment:      Review of Systems Unable to obtain secondary to altered mental status ______________________________________   PHYSICAL EXAM:  VITAL SIGNS: ED Triage Vitals  Enc Vitals Group     BP 08/12/16 2059 (!) 91/38     Pulse Rate 08/12/16 2059 (!) 125     Resp 08/12/16 2059 (!) 23     Temp 08/12/16 2059 97.5 F (36.4 C)     Temp Source 08/12/16 2059 Oral     SpO2 08/12/16 2058 (!) 73 %     Weight 08/12/16 2100 95 lb (43.1 kg)     Height 08/12/16 2100 5\' 2"  (1.575 m)     Head Circumference --      Peak Flow --      Pain Score --      Pain Loc --      Pain Edu? --      Excl. in [place]? --      Constitutional: Somnolent, will awaken to verbal stimuli Eyes: Conjunctivae are normal. Normal extraocular movements. ENT   Head: Normocephalic and atraumatic.   Nose: No congestion/rhinnorhea.   Mouth/Throat: Mucous membranes are moist.   Neck: No stridor. Hematological/Lymphatic/Immunilogical: No cervical lymphadenopathy. Cardiovascular: Tachycardic, regular rhythm.  No murmurs, rubs, or gallops.  Respiratory: Normal respiratory effort without tachypnea nor retractions. Breath sounds are clear and equal bilaterally. No wheezes/rales/rhonchi. Gastrointestinal: Soft and non tender. No rebound. No guarding.  Genitourinary: Deferred Musculoskeletal: Normal range of motion in all extremities. No lower extremity edema. Neurologic:  Somnolent. Will awaken to verbal stimuli. Moves all extremities. Sensation grossly intact.  Skin:  Skin is warm, dry and intact. No rash noted. ____________________________________________    LABS (pertinent positives/negatives)  Labs Reviewed   COMPREHENSIVE METABOLIC PANEL - Abnormal; Notable for the following:       Result Value   Sodium 132 (*)    Potassium 5.9 (*)    Chloride 96 (*)    CO2 12 (*)    Glucose, Bld 704 (*)    BUN 24 (*)    Creatinine, Ser 1.41 (*)    Calcium 8.6 (*)    Total Protein 6.3 (*)    AST 63 (*)    ALT 13 (*)    Total Bilirubin 1.9 (*)    GFR calc non Af Amer 37 (*)    GFR calc Af Amer 42 (*)    Anion gap 24 (*)    All other components within normal limits  CBC - Abnormal; Notable for the following:    WBC 12.1 (*)    RBC 3.29 (*)  Hemoglobin 10.9 (*)    HCT 33.3 (*)    MCV 101.3 (*)    All other components within normal limits  GLUCOSE, CAPILLARY - Abnormal; Notable for the following:    Glucose-Capillary >600 (*)    All other components within normal limits  BLOOD GAS, VENOUS - Abnormal; Notable for the following:    pH, Ven 7.13 (*)    pCO2, Ven 36 (*)    Bicarbonate 12.0 (*)    Acid-base deficit 16.1 (*)    All other components within normal limits  MRSA PCR SCREENING  LIPASE, BLOOD  URINALYSIS, COMPLETE (UACMP) WITH MICROSCOPIC  BASIC METABOLIC PANEL  BASIC METABOLIC PANEL  BASIC METABOLIC PANEL  BASIC METABOLIC PANEL  CBC  TROPONIN I  TROPONIN I  TROPONIN I  MAGNESIUM  PHOSPHORUS  BETA-HYDROXYBUTYRIC ACID  CBG MONITORING, ED     ____________________________________________   EKG  None  ____________________________________________    RADIOLOGY  None  ____________________________________________   PROCEDURES  Procedures  CRITICAL CARE Performed by: Nance Pear   Total critical care time: 30 minutes  Critical care time was exclusive of separately billable procedures and treating other patients.  Critical care was necessary to treat or prevent imminent or life-threatening deterioration.  Critical care was time spent personally by me on the following activities: development of treatment plan with patient and/or surrogate as well as  nursing, discussions with consultants, evaluation of patient's response to treatment, examination of patient, obtaining history from patient or surrogate, ordering and performing treatments and interventions, ordering and review of laboratory studies, ordering and review of radiographic studies, pulse oximetry and re-evaluation of patient's condition.  ____________________________________________   INITIAL IMPRESSION / ASSESSMENT AND PLAN / ED COURSE  Pertinent labs & imaging results that were available during my care of the patient were reviewed by me and considered in my medical decision making (see chart for details).  Patient presented to the emergency department today because of concerns for altered mental status and elevated sugars. The patient's sugars were found to be quite elevated here. Additionally patient had an anion gap and a pH of 7.15. All these findings were concerning for diabetic ketoacidosis. After it was confirmed that the patient's potassium was not load the patient was started on an insulin drip. Patient will be admitted to the hospitalist service for further workup and management. ____________________________________________   FINAL CLINICAL IMPRESSION(S) / ED DIAGNOSES  Final diagnoses:  Diabetic ketoacidosis without coma associated with other specified diabetes mellitus ([place])     Note: This dictation was prepared with Dragon dictation. Any transcriptional errors that result from this process are unintentional     Nance Pear, MD 08/12/16 2355

## 2016-08-13 ENCOUNTER — Encounter: Payer: Self-pay | Admitting: Physician Assistant

## 2016-08-13 DIAGNOSIS — N179 Acute kidney failure, unspecified: Secondary | ICD-10-CM | POA: Diagnosis present

## 2016-08-13 DIAGNOSIS — E876 Hypokalemia: Secondary | ICD-10-CM

## 2016-08-13 DIAGNOSIS — R7989 Other specified abnormal findings of blood chemistry: Secondary | ICD-10-CM | POA: Diagnosis not present

## 2016-08-13 DIAGNOSIS — C419 Malignant neoplasm of bone and articular cartilage, unspecified: Secondary | ICD-10-CM | POA: Diagnosis present

## 2016-08-13 DIAGNOSIS — E0810 Diabetes mellitus due to underlying condition with ketoacidosis without coma: Secondary | ICD-10-CM

## 2016-08-13 DIAGNOSIS — I214 Non-ST elevation (NSTEMI) myocardial infarction: Secondary | ICD-10-CM

## 2016-08-13 LAB — BASIC METABOLIC PANEL
ANION GAP: 6 (ref 5–15)
ANION GAP: 5 (ref 5–15)
ANION GAP: 2 — AB (ref 5–15)
BUN: 19 mg/dL (ref 6–20)
BUN: 20 mg/dL (ref 6–20)
BUN: 20 mg/dL (ref 6–20)
CHLORIDE: 113 mmol/L — AB (ref 101–111)
CHLORIDE: 112 mmol/L — AB (ref 101–111)
CHLORIDE: 113 mmol/L — AB (ref 101–111)
CO2: 21 mmol/L — AB (ref 22–32)
CO2: 22 mmol/L (ref 22–32)
CO2: 23 mmol/L (ref 22–32)
Calcium: 7.5 mg/dL — ABNORMAL LOW (ref 8.9–10.3)
Calcium: 7.6 mg/dL — ABNORMAL LOW (ref 8.9–10.3)
Calcium: 7.7 mg/dL — ABNORMAL LOW (ref 8.9–10.3)
Creatinine, Ser: 0.98 mg/dL (ref 0.44–1.00)
Creatinine, Ser: 0.89 mg/dL (ref 0.44–1.00)
Creatinine, Ser: 0.84 mg/dL (ref 0.44–1.00)
GFR calc Af Amer: >60 mL/min (ref >60)
GFR calc Af Amer: >60 mL/min (ref >60)
GFR calc Af Amer: >60 mL/min (ref >60)
GFR, EST NON AFRICAN AMERICAN: 57 mL/min — AB (ref >60)
GLUCOSE: 249 mg/dL — AB (ref 65–99)
GLUCOSE: 120 mg/dL — AB (ref 65–99)
GLUCOSE: 67 mg/dL (ref 65–99)
POTASSIUM: 3.3 mmol/L — AB (ref 3.5–5.1)
POTASSIUM: 3.1 mmol/L — AB (ref 3.5–5.1)
POTASSIUM: 3.3 mmol/L — AB (ref 3.5–5.1)
Sodium: 140 mmol/L (ref 135–145)
Sodium: 139 mmol/L (ref 135–145)
Sodium: 138 mmol/L (ref 135–145)

## 2016-08-13 LAB — MAGNESIUM
MAGNESIUM: 1.9 mg/dL (ref 1.7–2.4)
Magnesium: 1.6 mg/dL — ABNORMAL LOW (ref 1.7–2.4)

## 2016-08-13 LAB — GLUCOSE, CAPILLARY
GLUCOSE-CAPILLARY: 363 mg/dL — AB (ref 65–99)
GLUCOSE-CAPILLARY: 290 mg/dL — AB (ref 65–99)
GLUCOSE-CAPILLARY: 134 mg/dL — AB (ref 65–99)
GLUCOSE-CAPILLARY: 104 mg/dL — AB (ref 65–99)
GLUCOSE-CAPILLARY: 74 mg/dL (ref 65–99)
GLUCOSE-CAPILLARY: 44 mg/dL — AB (ref 65–99)
GLUCOSE-CAPILLARY: 73 mg/dL (ref 65–99)
Glucose-Capillary: 132 mg/dL — ABNORMAL HIGH (ref 65–99)
Glucose-Capillary: 142 mg/dL — ABNORMAL HIGH (ref 65–99)
Glucose-Capillary: 162 mg/dL — ABNORMAL HIGH (ref 65–99)
Glucose-Capillary: 228 mg/dL — ABNORMAL HIGH (ref 65–99)
Glucose-Capillary: 265 mg/dL — ABNORMAL HIGH (ref 65–99)
Glucose-Capillary: 357 mg/dL — ABNORMAL HIGH (ref 65–99)
Glucose-Capillary: 473 mg/dL — ABNORMAL HIGH (ref 65–99)

## 2016-08-13 LAB — PHOSPHORUS
Phosphorus: 5.7 mg/dL — ABNORMAL HIGH (ref 2.5–4.6)
Phosphorus: 3.1 mg/dL (ref 2.5–4.6)

## 2016-08-13 LAB — TROPONIN I
TROPONIN I: 0.84 ng/mL — AB (ref <0.03)
TROPONIN I: 1.09 ng/mL — AB (ref <0.03)
TROPONIN I: 1.12 ng/mL — AB (ref <0.03)
Troponin I: 0.16 ng/mL (ref <0.03)

## 2016-08-13 LAB — URINALYSIS, COMPLETE (UACMP) WITH MICROSCOPIC
BILIRUBIN URINE: NEGATIVE
Glucose, UA: >=500 mg/dL — AB
Hgb urine dipstick: NEGATIVE
KETONES UR: 80 mg/dL — AB
Leukocytes, UA: NEGATIVE
Nitrite: POSITIVE — AB
PROTEIN: NEGATIVE mg/dL
Specific Gravity, Urine: 1.011 (ref 1.005–1.030)
pH: 5 (ref 5.0–8.0)

## 2016-08-13 LAB — MRSA PCR SCREENING: MRSA BY PCR: NEGATIVE

## 2016-08-13 LAB — BETA-HYDROXYBUTYRIC ACID: Beta-Hydroxybutyric Acid: 4.42 mmol/L — ABNORMAL HIGH (ref 0.05–0.27)

## 2016-08-13 LAB — APTT: aPTT: 30 seconds (ref 24–36)

## 2016-08-13 LAB — PROTIME-INR
INR: 1.08
Prothrombin Time: 14 seconds (ref 11.4–15.2)

## 2016-08-13 LAB — POTASSIUM: Potassium: 3.8 mmol/L (ref 3.5–5.1)

## 2016-08-13 MED ORDER — ONDANSETRON 4 MG PO TBDP
4.0000 mg | ORAL_TABLET | Freq: Once | ORAL | Status: AC
  Administered 2016-08-13: 4 mg via ORAL
  Filled 2016-08-13: qty 1

## 2016-08-13 MED ORDER — INSULIN ASPART 100 UNIT/ML ~~LOC~~ SOLN: 0.0000 [IU] | Freq: Every day | SUBCUTANEOUS | Status: DC

## 2016-08-13 MED ORDER — HYDRALAZINE HCL 20 MG/ML IJ SOLN: 10.0000 mg | Freq: Four times a day (QID) | INTRAMUSCULAR | Status: DC | PRN

## 2016-08-13 MED ORDER — INSULIN ASPART 100 UNIT/ML ~~LOC~~ SOLN: 0.0000 [IU] | Freq: Three times a day (TID) | SUBCUTANEOUS | Status: DC

## 2016-08-13 MED ORDER — ADULT MULTIVITAMIN W/MINERALS CH: 1.0000 | ORAL_TABLET | Freq: Every day | ORAL | Status: DC

## 2016-08-13 MED ORDER — SODIUM CHLORIDE 0.9 % IV BOLUS (SEPSIS)
500.0000 mL | Freq: Once | INTRAVENOUS | Status: AC
  Administered 2016-08-13: 500 mL via INTRAVENOUS

## 2016-08-13 MED ORDER — POTASSIUM CHLORIDE CRYS ER 20 MEQ PO TBCR
40.0000 meq | EXTENDED_RELEASE_TABLET | Freq: Once | ORAL | Status: AC
  Administered 2016-08-13: 40 meq via ORAL
  Filled 2016-08-13: qty 2

## 2016-08-13 MED ORDER — HEPARIN (PORCINE) IN NACL 100-0.45 UNIT/ML-% IJ SOLN
750.0000 [IU]/h | INTRAMUSCULAR | Status: DC
  Administered 2016-08-13: 600 [IU]/h via INTRAVENOUS
  Filled 2016-08-13: qty 250

## 2016-08-13 MED ORDER — SODIUM CHLORIDE 0.9 % IV SOLN: INTRAVENOUS | Status: DC

## 2016-08-13 MED ORDER — ADULT MULTIVITAMIN LIQUID CH
15.0000 mL | Freq: Every day | ORAL | Status: DC
  Administered 2016-08-13 – 2016-08-15 (×2): 15 mL via ORAL
  Filled 2016-08-13 (×3): qty 15

## 2016-08-13 MED ORDER — INSULIN GLARGINE 100 UNIT/ML ~~LOC~~ SOLN
13.0000 [IU] | Freq: Every day | SUBCUTANEOUS | Status: DC
  Administered 2016-08-13: 13 [IU] via SUBCUTANEOUS
  Filled 2016-08-13 (×2): qty 0.13

## 2016-08-13 MED ORDER — HEPARIN BOLUS VIA INFUSION
3000.0000 [IU] | Freq: Once | INTRAVENOUS | Status: AC
  Administered 2016-08-13: 3000 [IU] via INTRAVENOUS
  Filled 2016-08-13: qty 3000

## 2016-08-13 MED ORDER — GLUCERNA SHAKE PO LIQD
237.0000 mL | Freq: Three times a day (TID) | ORAL | Status: DC
  Administered 2016-08-13 – 2016-08-15 (×3): 237 mL via ORAL

## 2016-08-13 MED ORDER — CHLORHEXIDINE GLUCONATE 0.12 % MT SOLN
15.0000 mL | Freq: Two times a day (BID) | OROMUCOSAL | Status: DC
  Administered 2016-08-13 – 2016-08-15 (×4): 15 mL via OROMUCOSAL
  Filled 2016-08-13 (×4): qty 15

## 2016-08-13 MED ORDER — SODIUM CHLORIDE 0.9 % IV SOLN
INTRAVENOUS | Status: DC
  Administered 2016-08-13 – 2016-08-14 (×2): via INTRAVENOUS

## 2016-08-13 MED ORDER — MAGNESIUM SULFATE 2 GM/50ML IV SOLN
2.0000 g | Freq: Once | INTRAVENOUS | Status: AC
  Administered 2016-08-13: 2 g via INTRAVENOUS
  Filled 2016-08-13: qty 50

## 2016-08-13 MED ORDER — INSULIN ASPART 100 UNIT/ML ~~LOC~~ SOLN
0.0000 [IU] | Freq: Three times a day (TID) | SUBCUTANEOUS | Status: DC
Start: 2016-08-13 — End: 2016-08-13

## 2016-08-13 MED ORDER — ORAL CARE MOUTH RINSE
15.0000 mL | Freq: Two times a day (BID) | OROMUCOSAL | Status: DC
  Administered 2016-08-14 (×2): 15 mL via OROMUCOSAL

## 2016-08-13 MED ORDER — ASPIRIN EC 81 MG PO TBEC
81.0000 mg | DELAYED_RELEASE_TABLET | Freq: Every day | ORAL | Status: DC
  Administered 2016-08-13 – 2016-08-15 (×2): 81 mg via ORAL
  Filled 2016-08-13 (×2): qty 1

## 2016-08-13 MED ORDER — DEXTROSE 50 % IV SOLN
INTRAVENOUS | Status: AC
  Administered 2016-08-13: 25 mL
  Filled 2016-08-13: qty 50

## 2016-08-13 MED ORDER — ENOXAPARIN SODIUM 40 MG/0.4ML ~~LOC~~ SOLN: 40.0000 mg | SUBCUTANEOUS | Status: DC

## 2016-08-13 MED ORDER — BLISTEX MEDICATED EX OINT
TOPICAL_OINTMENT | CUTANEOUS | Status: DC | PRN
  Administered 2016-08-13: 1 via TOPICAL
  Filled 2016-08-13: qty 6.3

## 2016-08-13 MED ORDER — FAMOTIDINE 20 MG PO TABS
20.0000 mg | ORAL_TABLET | Freq: Every day | ORAL | Status: DC
  Administered 2016-08-13 – 2016-08-15 (×3): 20 mg via ORAL
  Filled 2016-08-13 (×3): qty 1

## 2016-08-13 NOTE — Progress Notes (Signed)
Pharmacy Consult for Electrolyte Monitoring Indication: Hypokalemia (DKA - pt was on insulin drip)  Allergies  Allergen Reactions  . Influenza Vaccines Shortness Of Breath  . Percocet [Oxycodone-Acetaminophen] Nausea And Vomiting  . Statins     Joint pain  . Sulfa Antibiotics Nausea And Vomiting    Patient Measurements: Height: 5\' 2"  (157.5 cm) Weight: 114 lb 6.7 oz (51.9 kg) IBW/kg (Calculated) : 50.1  Vital Signs: Temp: 99 F (37.2 C) (05/10 0900) Temp Source: Oral (05/10 0900) BP: 104/60 (05/10 1100) Pulse Rate: 105 (05/10 1100) Intake/Output from previous day: 05/09 0701 - 05/10 0700 In: 550.9 [I.V.:550.9] Out: 380 [Urine:380] Intake/Output from this shift: Total I/O In: 164 [P.O.:100; I.V.:14; IV Piggyback:50] Out: -   Labs:  Recent Labs  08/12/16 2100 08/13/16 0026 08/13/16 0435 08/13/16 0805 08/13/16 1137 08/13/16 1610  WBC 12.1*  --   --   --   --   --   HGB 10.9*  --   --   --   --   --   HCT 33.3*  --   --   --   --   --   PLT 221  --   --   --   --   --   APTT  --   --   --   --   --  30  CREATININE 1.41*  --  0.98 0.89 0.84  --   MG  --  1.6*  --   --  1.9  --   PHOS  --  5.7*  --   --  3.1  --   ALBUMIN 3.6  --   --   --   --   --   PROT 6.3*  --   --   --   --   --   AST 63*  --   --   --   --   --   ALT 13*  --   --   --   --   --   ALKPHOS 75  --   --   --   --   --   BILITOT 1.9*  --   --   --   --   --    Estimated Creatinine Clearance: 48.6 mL/min (by C-G formula based on SCr of 0.84 mg/dL).   Microbiology: Recent Results (from the past 720 hour(s))  MRSA PCR Screening     Status: None   Collection Time: 08/12/16 11:45 PM  Result Value Ref Range Status   MRSA by PCR NEGATIVE NEGATIVE Final    Comment:        The GeneXpert MRSA Assay (FDA approved for NASAL specimens only), is one component of a comprehensive MRSA colonization surveillance program. It is not intended to diagnose MRSA infection nor to guide or monitor  treatment for MRSA infections.     Medical History: Past Medical History:  Diagnosis Date  . Coronary artery disease   . Diabetes mellitus without complication (HCC)    insulin pump  . Headache    since ear problem started  . Hypertension   . Myocardial infarction ([place]) 11/01/13    Assessment: 71 y/o F admitted with DKA admitted to the ICU for insulin infusion. Insulin drip was stopped 5/10 am. Pt received KCl 40 mEq @ 1700. 5/10 1730 K = 3.8  Plan:  Since patient is no longer on drip, is not needing SSI, and received 40 mEq at 1700, will not replace and f/u  with am labs  Darrow Bussing, PharmD Pharmacy Resident 08/13/2016 6:25 PM

## 2016-08-13 NOTE — Progress Notes (Signed)
Pt is alert and oriented with no c/o pain. SpO2 < 90% on RA. Lung sounds clear to auscultation. NSR on cardiac monitor. Pt hypotensive sys 80-70s, MAP upper 50s, lower 60s. HR in one-teens. T 99.0, oral. Previous report of 5L NS since admission. Pt due to transition off insulin gtt within the hour. Will discuss with doc regarding sliding scale recommendations per DM coordinator, liver abnormalities on bld work, fluids ordered, and potential infection.

## 2016-08-13 NOTE — Consult Note (Signed)
Name: Diana Frost MRN: [id number] DOB: [birth year]    ADMISSION DATE:  08/12/2016 CONSULTATION DATE:  08/13/16  REFERRING MD :  Dr. Pollyann Samples  CHIEF COMPLAINT:  "High Blood Glucose"  BRIEF PATIENT DESCRIPTION: 71 year old female with AMS related to DKA  SIGNIFICANT EVENTS  5/9 >> Patient admitted to the ICU with DKA, on insulin gtt  STUDIES:  none   HISTORY OF PRESENT ILLNESS:  Diana Frost is a 71 year old female with known history of CAD, DM, Headache , HTN and MI.  Patient presented to ER on 5/9 with concerns of Altered mental status and when her blood glucose was checked it was extremely elevated.  Patient's daughter stated that she had been sick lately and her insulin has been changed to pump.  Hospitalist team were called to admit the patient and PCCM team was consulted for further management.  PAST MEDICAL HISTORY :   has a past medical history of Coronary artery disease; Diabetes mellitus without complication ([place]); Headache; Hypertension; and Myocardial infarction ([name]) (11/01/13).  has a past surgical history that includes Cataract extraction bilateral w/ anterior vitrectomy; Abdominal hysterectomy; Coronary angioplasty with stent (11/01/13); Melanoma excision (Right); Back surgery; Myringotomy with tube placement (Left, 08/21/2015); Tonsillectomy; and Nasopharyngeal biopsy (N/A, 11/07/2015). Prior to Admission medications   Medication Sig Start Date End Date Taking? Authorizing Provider  aspirin 81 MG tablet Take 81 mg by mouth daily.   Yes [provider]  FLUoxetine (PROZAC) 40 MG capsule Take 40 mg by mouth every evening.    Yes [provider]  insulin glargine (LANTUS) 100 UNIT/ML injection Inject 13 Units into the skin daily as needed (If pump does not lower blood sugars). As needed if insulin pump is not working    Yes [provider]  insulin lispro (HUMALOG) 100 UNIT/ML injection Inject 13 Units into the skin over 24 hr. In insulin pump     Yes [provider]  ondansetron (ZOFRAN-ODT) 4 MG disintegrating tablet Take 4 mg by mouth every 8 (eight) hours as needed for nausea or vomiting.   Yes [provider]  rosuvastatin (CRESTOR) 10 MG tablet Take 10 mg by mouth every evening.    Yes [provider]  amoxicillin-clavulanate (AUGMENTIN) 875-125 MG tablet Take 1 tablet by mouth 2 (two) times daily. Patient not taking: Reported on 08/12/2016 11/07/15   Carloyn Manner, MD  Glucagon, rDNA, (GLUCAGON EMERGENCY IJ) Inject as directed as needed (for low blood sugar).    [provider]  traMADol (ULTRAM) 50 MG tablet Take 1 tablet (50 mg total) by mouth every 6 (six) hours as needed. Patient not taking: Reported on 08/12/2016 11/07/15   Carloyn Manner, MD   Allergies  Allergen Reactions  . Influenza Vaccines Shortness Of Breath  . Percocet [Oxycodone-Acetaminophen] Nausea And Vomiting  . Statins     Joint pain  . Sulfa Antibiotics Nausea And Vomiting    FAMILY HISTORY:  family history is not on file. SOCIAL HISTORY:  reports that she quit smoking about 18 years ago. Her smoking use included Cigarettes. She has never used smokeless tobacco. She reports that she drinks about 10.2 oz of alcohol per week . She reports that she does not use drugs.  REVIEW OF SYSTEMS:   Constitutional: Negative for fever, chills, weight loss, malaise/fatigue and diaphoresis.  HENT: Negative for hearing loss, ear pain, nosebleeds, congestion, sore throat, neck pain, tinnitus and ear discharge.   Eyes: Negative for blurred vision, double vision, photophobia, pain,  discharge and redness.  Respiratory: Negative for cough, hemoptysis, sputum production, shortness of breath, wheezing and stridor.   Cardiovascular: Negative for chest pain, palpitations, orthopnea, claudication, leg swelling and PND.  Gastrointestinal: Negative for heartburn, nausea, vomiting, abdominal pain, diarrhea, constipation, blood in stool and melena.   Genitourinary: Negative for dysuria, urgency, frequency, hematuria and flank pain.  Musculoskeletal: Negative for myalgias, back pain, joint pain and falls.  Skin: Negative for itching and rash.  Neurological: Negative for dizziness, tingling, tremors, sensory change, speech change, focal weakness, seizures, loss of consciousness, weakness and headaches.  Endo/Heme/Allergies: Negative for environmental allergies and polydipsia. Does not bruise/bleed easily.  SUBJECTIVE: Patient states that "her blood glucose has been elevated and it is difficult to bring it down"  VITAL SIGNS: Temp:  [97.5 F (36.4 C)] 97.5 F (36.4 C) (05/09 2059) Pulse Rate:  [55-125] 88 (05/09 2230) Resp:  [14-23] 22 (05/09 2300) BP: (64-97)/(37-52) 97/43 (05/09 2320) SpO2:  [73 %-100 %] 100 % (05/09 2230) Weight:  [43.1 kg (95 lb)] 43.1 kg (95 lb) (05/09 2100)  PHYSICAL EXAMINATION: General:  Elderly white female, in no acute distress Neuro:  Awake, Alert and oriented HEENT:  AT,[place],No JVD Cardiovascular: S1S2,Tachycardic, no m/r/g Lungs:  Clear bilaterally, no wheezes,crackles, rhonchi noted Abdomen: Soft,NT,ND,+BS Musculoskeletal:  No edema, cyanosis noted Skin:  Warm ,dry and Intact   Recent Labs Lab 08/12/16 2100  NA 132*  K 5.9*  CL 96*  CO2 12*  BUN 24*  CREATININE 1.41*  GLUCOSE 704*    Recent Labs Lab 08/12/16 2100  HGB 10.9*  HCT 33.3*  WBC 12.1*  PLT 221   No results found.  ASSESSMENT / PLAN:  Diabetic Ketoacidosis AGMA Hyperkalemia Hyponatremia Acute Kidney Injury  Leukocytosis possibly related to stress response   Plan  Continue Insulin gtt  Aggressively hydrate the patient  Will transition off the insulin drip when AG<12  And CO2>20  will transition fluids to D51/2 n/s once the Blood glucose is less than 250mg /dl  Hyperkalemia does not need to be treated  Monitor I/O  Follow BMP every 4 hourly    Diana Frost,AG-ACNP [facility]   08/13/2016, 12:21 AM

## 2016-08-13 NOTE — Progress Notes (Signed)
Pharmacy Consult for Electrolyte Monitoring Indication: Hypokalemia (DKA on insulin drip)  Allergies  Allergen Reactions  . Influenza Vaccines Shortness Of Breath  . Percocet [Oxycodone-Acetaminophen] Nausea And Vomiting  . Statins     Joint pain  . Sulfa Antibiotics Nausea And Vomiting    Patient Measurements: Height: 5\' 2"  (157.5 cm) Weight: 114 lb 6.7 oz (51.9 kg) IBW/kg (Calculated) : 50.1  Vital Signs: Temp: 99 F (37.2 C) (05/10 0900) Temp Source: Oral (05/10 0900) BP: 104/60 (05/10 1100) Pulse Rate: 105 (05/10 1100) Intake/Output from previous day: 05/09 0701 - 05/10 0700 In: 550.9 [I.V.:550.9] Out: 380 [Urine:380] Intake/Output from this shift: Total I/O In: 164 [P.O.:100; I.V.:14; IV Piggyback:50] Out: -   Labs:  Recent Labs  08/12/16 2100 08/13/16 0026 08/13/16 0435 08/13/16 0805 08/13/16 1137  WBC 12.1*  --   --   --   --   HGB 10.9*  --   --   --   --   HCT 33.3*  --   --   --   --   PLT 221  --   --   --   --   CREATININE 1.41*  --  0.98 0.89 0.84  MG  --  1.6*  --   --  1.9  PHOS  --  5.7*  --   --  3.1  ALBUMIN 3.6  --   --   --   --   PROT 6.3*  --   --   --   --   AST 63*  --   --   --   --   ALT 13*  --   --   --   --   ALKPHOS 75  --   --   --   --   BILITOT 1.9*  --   --   --   --    Estimated Creatinine Clearance: 48.6 mL/min (by C-G formula based on SCr of 0.84 mg/dL).   Microbiology: Recent Results (from the past 720 hour(s))  MRSA PCR Screening     Status: None   Collection Time: 08/12/16 11:45 PM  Result Value Ref Range Status   MRSA by PCR NEGATIVE NEGATIVE Final    Comment:        The GeneXpert MRSA Assay (FDA approved for NASAL specimens only), is one component of a comprehensive MRSA colonization surveillance program. It is not intended to diagnose MRSA infection nor to guide or monitor treatment for MRSA infections.     Medical History: Past Medical History:  Diagnosis Date  . Coronary artery disease   .  Diabetes mellitus without complication (HCC)    insulin pump  . Headache    since ear problem started  . Hypertension   . Myocardial infarction ([place]) 11/01/13    Assessment: 71 y/o F admitted with DKA admitted to the ICU for insulin infusion.   Plan:  Magnesium and potassium replaced. Will recheck K at 1800 and electrolytes with AM labs.   Ulice Dash D 08/13/2016,3:20 PM

## 2016-08-13 NOTE — Progress Notes (Signed)
[facility] NAME: Diana Frost    MR#:  [id number]  DATE OF BIRTH:  [birth year]  SUBJECTIVE:   Pt. Here due to N/V and vague abdominal pain and noted to be in acute Diabetic Ketoacidosis. Remains slightly hypotensive, Tachycardic.  Being weaned off insulin gtt now.  Continues to have persistent Nausea.    REVIEW OF SYSTEMS:    Review of Systems  Constitutional: Negative for chills and fever.  HENT: Negative for congestion and tinnitus.   Eyes: Negative for blurred vision and double vision.  Respiratory: Negative for cough, shortness of breath and wheezing.   Cardiovascular: Negative for chest pain, orthopnea and PND.  Gastrointestinal: Positive for abdominal pain and nausea. Negative for diarrhea and vomiting.  Genitourinary: Negative for dysuria and hematuria.  Neurological: Negative for dizziness, sensory change and focal weakness.  All other systems reviewed and are negative.   Nutrition: Carb control Tolerating Diet: Yes Tolerating PT: Await Eval.   DRUG ALLERGIES:   Allergies  Allergen Reactions  . Influenza Vaccines Shortness Of Breath  . Percocet [Oxycodone-Acetaminophen] Nausea And Vomiting  . Statins     Joint pain  . Sulfa Antibiotics Nausea And Vomiting    VITALS:  Blood pressure 104/60, pulse (!) 105, temperature 99 F (37.2 C), temperature source Oral, resp. rate 20, height 5\' 2"  (1.575 m), weight 51.9 kg (114 lb 6.7 oz), SpO2 99 %.  PHYSICAL EXAMINATION:   Physical Exam  GENERAL:  71 y.o.-year-old patient lying in bed in no acute distress.  EYES: Pupils equal, round, reactive to light and accommodation. No scleral icterus. Extraocular muscles intact.  HEENT: Head atraumatic, normocephalic. Oropharynx and nasopharynx clear.  NECK:  Supple, no jugular venous distention. No thyroid enlargement, no tenderness.  LUNGS: Normal breath sounds bilaterally, no wheezing, rales, rhonchi. No use of accessory  muscles of respiration.  CARDIOVASCULAR: S1, S2 RRR and tachy. No murmurs, rubs, or gallops.  ABDOMEN: Soft, nontender, nondistended. Bowel sounds present. No organomegaly or mass.  EXTREMITIES: No cyanosis, clubbing or edema b/l.    NEUROLOGIC: Cranial nerves II through XII are intact. No focal Motor or sensory deficits b/l.   PSYCHIATRIC: The patient is alert and oriented x 3.  SKIN: No obvious rash, lesion, or ulcer.    LABORATORY PANEL:   CBC  Recent Labs Lab 08/12/16 2100  WBC 12.1*  HGB 10.9*  HCT 33.3*  PLT 221   ------------------------------------------------------------------------------------------------------------------  Chemistries   Recent Labs Lab 08/12/16 2100 08/13/16 0026  08/13/16 1137  NA 132*  --   < > 138  K 5.9*  --   < > 3.3*  CL 96*  --   < > 113*  CO2 12*  --   < > 23  GLUCOSE 704*  --   < > 67  BUN 24*  --   < > 20  CREATININE 1.41*  --   < > 0.84  CALCIUM 8.6*  --   < > 7.7*  MG  --  1.6*  --   --   AST 63*  --   --   --   ALT 13*  --   --   --   ALKPHOS 75  --   --   --   BILITOT 1.9*  --   --   --   < > = values in this interval not displayed. ------------------------------------------------------------------------------------------------------------------  Cardiac Enzymes  Recent Labs Lab 08/13/16 1137  TROPONINI 1.09*   ------------------------------------------------------------------------------------------------------------------  RADIOLOGY:  No results found.   ASSESSMENT AND PLAN:   71 year old female with past medical history of coronary artery disease with previous MI, hypertension, diabetes who presented to the hospital due to acute diabetic ketoacidosis.  1. Acute diabetic ketoacidosis-much improved. Anion gap closed, BS improved.  - Being weaned off the insulin drip presently, we'll start on Lantus and sliding scale insulin. Patient is on insulin pump but as per diabetes coordinator that cannot be started  tomorrow as patient received dose of Lantus today.  -Continue carb-controlled diet, follow blood sugars.  2. Elevated troponin-patient has had steady elevation in her troponin went up from 0.6 to 1.09. - pt. Acutely denies any chest pain but just persistent nausea.  - cont. ASa. Check Echo, Will get Cardiology consult.    3. Hypokalemia - due to pt. Being on insulin gtt and also due to DKA - cont. To supplement and repeat level in a.m.   4. Depression-continue Prozac.  5. Hyperlipidemia-continue Crestor.    All the records are reviewed and case discussed with Care Management/Social Worker. Management plans discussed with the patient, family and they are in agreement.  CODE STATUS: DO NOT RESUSCITATE  DVT Prophylaxis: Lovenox  TOTAL TIME TAKING CARE OF THIS PATIENT: 30 minutes.   POSSIBLE D/C IN 1-2 DAYS, DEPENDING ON CLINICAL CONDITION.   Henreitta Leber M.D on 08/13/2016 at 2:36 PM  Between 7am to 6pm - Pager - [phone]  After 6pm go to [email or web address] - [employer] [place] [facility]  Office  [phone]  CC: Primary care physician; Valera [place], MD

## 2016-08-13 NOTE — Progress Notes (Signed)
71 y/o F ordered Lovenox 30 mg daily for DVT prophylaxis.   Estimated Creatinine Clearance: 41.6 mL/min (by C-G formula based on SCr of 0.98 mg/dL). Body mass index is 20.93 kg/m. Wt: 51.9 kg  Will increase Lovenox to 40 mg daily for improvement in SCr.   Ulice Dash, PharmD Clinical Pharmacist

## 2016-08-13 NOTE — Progress Notes (Signed)
Nutrition Education Note  RD consulted for nutrition education regarding gastroparesis    RD provided "Gastroparesis Nutrition Therapy" handout from the Academy of [employer]. Reviewed patient's dietary recall. Advised patient to eat multiple small meals, avoid high fat foods, and avoid high fiber foods. Advised patient to chew foods well before swallowing, drink fluids throughout meals, and to sit up or walk after meals if possible. Gave patient examples of meals and menu planning.   Teach back method used.  Expect good compliance.  Body mass index is 20.93 kg/m. Pt meets criteria for normal weight based on current BMI.  Current diet order is carbohydrate controlled, patient is consuming approximately 25% of meals at this time.   RD following this patient.  Koleen Distance MS, RD, LDN Pager #- [phone]

## 2016-08-13 NOTE — Progress Notes (Addendum)
Initial Nutrition Assessment  DOCUMENTATION CODES:   Non-severe (moderate) malnutrition in context of chronic illness  INTERVENTION:   Glucerna Shake po TID, each supplement provides 220 kcal and 10 grams of protein  MVI  NUTRITION DIAGNOSIS:   Malnutrition (moderate) related to altered GI function, nausea, vomiting as evidenced by moderate depletions of muscle mass, moderate depletion of body fat.  GOAL:   Patient will meet greater than or equal to 90% of their needs  MONITOR:   PO intake, Supplement acceptance, Labs, Weight trends  REASON FOR ASSESSMENT:   Consult, Malnutrition Screening Tool Assessment of nutrition requirement/status  ASSESSMENT:   71 y.o. female with a known history of CAD s/p MI, DM, HTN presents to the emergency department for evaluation of Altered mental status.  Patient was in a usual state of health until today when she was found to have increased weakness and lethargy associated with elevated blood sugar. In the emergency department she was found to be in DKA for which admission was requested.   Met with pt in room today. Pt reports poor appetite for the past several years. Pt reports that she has never been a big eater but that now everything she eats she vomits back up. Pt reports nausea after meals for several years but reports that it has gotten worse lately and that now she has nausea in the mornings even before she has eaten anything. Pt with suspected gastroparesis secondary to DM. Pt has been tolerating Glucerna Smoothies at home and is requesting Glucerna in hospital. Per chart, pt has lost 20lbs(15%) over the past year; this is not significant given time frame. Pt currently eating 25% meals in hospital. RD will order Glucerna; advised pt to sip on supplement throughout the day. Pt educated regarding gastroparesis diet.   Medications reviewed and include: aspirin, lovenox, pepcid, insulin, KCl  Labs reviewed: K 3.3(L), Cl 113(H), Ca 7.7(L), P  3.1 wnl, Mg 1.6(L) Wbc- 12.1(H) cbgs- 704, 249, 120, 67 x 48hrs  Nutrition-Focused physical exam completed. Findings are moderate fat depletion and moderate muscle depletion over entire body, and no edema.   Diet Order:  Diet Carb Modified Fluid consistency: Thin; Room service appropriate? Yes  Skin:  Reviewed, no issues  Last BM:  5/10  Height:   Ht Readings from Last 1 Encounters:  08/13/16 5' 2"  (1.575 m)    Weight:   Wt Readings from Last 1 Encounters:  08/13/16 114 lb 6.7 oz (51.9 kg)    Ideal Body Weight:  50 kg  BMI:  Body mass index is 20.93 kg/m.  Estimated Nutritional Needs:   Kcal:  1400-1600kcal/day   Protein:  62-73g/day   Fluid:  >1.4L/day   EDUCATION NEEDS:   Education needs addressed  Koleen Distance MS, RD, LDN Pager #[phone]

## 2016-08-13 NOTE — Progress Notes (Signed)
Pt verbalizes consistant nausea outside of hospital. Pt has vomited juice x1 with PO intake. Pt reports trying to make appt with an outpatient gastro specialist, but has been unable to get an appt. Marda Stalker, CCNP ordered Pepcid for possible gastroparesis r/t unmanaged DM. DM still to see pt. Pt and daughter are looking forward to speaking with her d/t questions relating to her management. Pt has had one episode of hypoglycemia at 43- 58ml of D5 admin with 132 recheck. Pt has been ordered SL Zofran for nausea, lunch is in front of pt and daughter at bedside. Dietician has also been consulted for verbalized wt loss over the past year. Pt is with orders to transfer to the floor.

## 2016-08-13 NOTE — Consult Note (Signed)
Cardiology Consultation Note  Patient ID: Diana Frost, MRN: [id number], DOB/AGE: Nov 04, 1945 71 y.o. Admit date: 08/12/2016   Date of Consult: 08/13/2016 Primary Physician: Valera Castle, MD Primary Cardiologist: Dr. Sheppard Coil, MD ([facility]) Requesting Physician: Dr. Verdell Carmine, MD  Chief Complaint: Nausea/vomiting/AMS Reason for Consult: NSTEMI  HPI: Diana Frost is a 71 y.o. female who is being seen today for the evaluation of NSTEMI at the request of Dr. Verdell Carmine, MD. 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   Patient is followed by [facility]. Prior admission in 12/2117 for DKA complicated by NSTEMI. LHC at that time showed 90% mid LAD stenosis, 80% distal LAD stenosis, 80% mid LCx, 80% proximal OM2, 80% mid RCA, 50% RCA. She underwent successful PCI/DES of the LAD and RCA. LV gram with an estimated EF of 55% with mild anterior hypokinesis. This was followed by staged PCI/stenting of the LCx in 11/2012. She has had issues with generalized fatigue and lightheadedness that are not new for her and has chronic dyspnea. She has issues with sleeping at night.She apparently had an exercise stress test in 09/2015, though these results are not in [place].   Patient is under a lot of stress with care taking for her husband as she is the primary caregiver.   Patient was admitted to [facility] in 04/2016 for sepsis in the setting influenza and PNA and discharged to Hospice. While under Hospice care she continued to improve and eventually was taken off Hospice and her regular medical care and interventions were restarted.   Over the past 2-3 weeks patient has  had more nausea and vomiting than she has at baseline. Typically, she vomits several times per month and has done so for years. However, over the past couple of weeks this has been almost daily. Never with ches tpain. In this setting her blood sugars have been running "high." Ultimately, she checked her blood sugars on 5/9 hand her glucometer read "high." This was followed by AMS and a "fall."   She presented to [facility] on 5/9 with AMS. She was apparently found to have increased weakness and lethargy with associated hyperglycemia. Becuase of this she was brought to the ED where she was found to be in DKA. She was placed on insulin gtt and IV fluids. Initial glucose was noted to be 704. Most recent glucose of 132 with a low of 44 at 11:34 this morning. Troponin was noted to be mildly elevated at 0.16-->0.84-->1.09. Never with chest pain. Stable chronic dyspnea. EKG showed sinus tachycardia, 115 bpm, no acute st/t changes. CXR not done. SCr 1.41-->0.84 at time of cardiology consult. Potassium 5.9-->3.3. Magnesium 1.6. WBC 12.1, HGB 10.9, PLT 221. AST 63, T bili 1.9. She has since been transitioned off the insulin gtt. Never with chest pain (never had chest pain with prior MI).  Symptoms feel exactly like her prior NSTEMI.   Past Medical History:  Diagnosis Date  . Coronary artery disease   . Diabetes mellitus without complication (HCC)    insulin pump  . Headache    since ear problem started  . Hypertension   . Myocardial infarction ([place]) 11/01/13  Most Recent Cardiac Studies: As above   Surgical History:  Past Surgical History:  Procedure Laterality Date  . ABDOMINAL HYSTERECTOMY    . BACK SURGERY     tail bone removed after fall/fracture  . CATARACT EXTRACTION BILATERAL W/ ANTERIOR VITRECTOMY    . CORONARY ANGIOPLASTY WITH STENT PLACEMENT  11/01/13   [facility], stent x2  . MELANOMA EXCISION Right    arm with skin graft  . MYRINGOTOMY WITH TUBE PLACEMENT Left 08/21/2015   Procedure:  MYRINGOTOMY WITH TUBE PLACEMENT;  Surgeon: Carloyn Manner, MD;  Location: [place];  Service: ENT;  Laterality: Left;  . NASOPHARYNGEAL BIOPSY N/A 11/07/2015   Procedure: NASOPHARYNGEAL BIOPSY;  Surgeon: Carloyn Manner, MD;  Location: [facility];  Service: ENT;  Laterality: N/A;  . TONSILLECTOMY       Home Meds: Prior to Admission medications   Medication Sig Start Date End Date Taking? Authorizing Provider  aspirin 81 MG tablet Take 81 mg by mouth daily.   Yes [provider]  FLUoxetine (PROZAC) 40 MG capsule Take 40 mg by mouth every evening.    Yes [provider]  insulin glargine (LANTUS) 100 UNIT/ML injection Inject 13 Units into the skin daily as needed (If pump does not lower blood sugars). As needed if insulin pump is not working    Yes [provider]  insulin lispro (HUMALOG) 100 UNIT/ML injection Inject 13 Units into the skin over 24 hr. In insulin pump    Yes [provider]  ondansetron (ZOFRAN-ODT) 4 MG disintegrating tablet Take 4 mg by mouth every 8 (eight) hours as needed for nausea or vomiting.   Yes [provider]  rosuvastatin (CRESTOR) 10 MG tablet Take 10 mg by mouth every evening.    Yes [provider]  amoxicillin-clavulanate (AUGMENTIN) 875-125 MG tablet Take 1 tablet by mouth 2 (two) times daily. Patient not taking: Reported on 08/12/2016 11/07/15   Carloyn Manner, MD  Glucagon, rDNA, (GLUCAGON EMERGENCY IJ) Inject as directed as needed (for low blood sugar).    [provider]  traMADol (ULTRAM) 50 MG tablet Take 1 tablet (50 mg total) by mouth every 6 (six) hours as needed. Patient not taking: Reported on 08/12/2016 11/07/15   Carloyn Manner, MD    Inpatient Medications:  . aspirin EC  81 mg Oral Daily  . chlorhexidine  15 mL Mouth Rinse BID  . [START ON 08/14/2016] enoxaparin (LOVENOX) injection  40 mg Subcutaneous Q24H  . famotidine  20 mg Oral Daily  . feeding supplement (GLUCERNA  SHAKE)  237 mL Oral TID BM  . FLUoxetine  40 mg Oral QPM  . insulin aspart  0-5 Units Subcutaneous QHS  . insulin aspart  0-9 Units Subcutaneous TID WC  . insulin glargine  13 Units Subcutaneous Daily  . mouth rinse  15 mL Mouth Rinse q12n4p  . multivitamin with minerals  1 tablet Oral Daily  . potassium chloride  40 mEq Oral Once  . rosuvastatin  10 mg Oral QPM   . sodium chloride      Allergies:  Allergies  Allergen Reactions  . Influenza Vaccines Shortness Of Breath  . Percocet [Oxycodone-Acetaminophen] Nausea And Vomiting  . Statins     Joint pain  . Sulfa Antibiotics Nausea And Vomiting    Social History   Social History  . Marital status: Married    Spouse name: N/A  . Number of children: N/A  . Years of education: N/A   Occupational History  .  Not on file.   Social History Main Topics  . Smoking status: Former Smoker    Types: Cigarettes    Quit date: 04/30/1998  . Smokeless tobacco: Never Used  . Alcohol use 10.2 oz/week    17 Glasses of wine per week     Comment:    . Drug use: No  . Sexual activity: Not on file   Other Topics Concern  . Not on file   Social History Narrative  . No narrative on file     Family History  Problem Relation Age of Onset  . Lung cancer Father   . Breast cancer Maternal Grandfather      Review of Systems: Review of Systems  Constitutional: Positive for malaise/fatigue. Negative for chills, diaphoresis, fever and weight loss.  HENT: Negative for congestion.   Eyes: Negative for discharge and redness.  Respiratory: Positive for shortness of breath. Negative for cough, hemoptysis, sputum production and wheezing.   Cardiovascular: Negative for chest pain, palpitations, orthopnea, claudication, leg swelling and PND.  Gastrointestinal: Positive for nausea and vomiting. Negative for abdominal pain, blood in stool, heartburn and melena.  Genitourinary: Negative for hematuria.  Musculoskeletal: Negative for falls and  myalgias.  Skin: Negative for rash.  Neurological: Positive for weakness. Negative for dizziness, tingling, tremors, sensory change, speech change, focal weakness and loss of consciousness.  Endo/Heme/Allergies: Does not bruise/bleed easily.  Psychiatric/Behavioral: Negative for substance abuse. The patient is not nervous/anxious.   All other systems reviewed and are negative.   Labs:  Recent Labs  08/13/16 0026 08/13/16 0435 08/13/16 1137  TROPONINI 0.16* 0.84* 1.09*   Lab Results  Component Value Date   WBC 12.1 (H) 08/12/2016   HGB 10.9 (L) 08/12/2016   HCT 33.3 (L) 08/12/2016   MCV 101.3 (H) 08/12/2016   PLT 221 08/12/2016    Recent Labs Lab 08/12/16 2100  08/13/16 1137  NA 132*  < > 138  K 5.9*  < > 3.3*  CL 96*  < > 113*  CO2 12*  < > 23  BUN 24*  < > 20  CREATININE 1.41*  < > 0.84  CALCIUM 8.6*  < > 7.7*  PROT 6.3*  --   --   BILITOT 1.9*  --   --   ALKPHOS 75  --   --   ALT 13*  --   --   AST 63*  --   --   GLUCOSE 704*  < > 67  < > = values in this interval not displayed. No results found for: CHOL, HDL, LDLCALC, TRIG No results found for: DDIMER  Radiology/Studies:  No results found.  EKG: Interpreted by me showed: sinus tachycardia, 115 bpm, no acute st/t changes Telemetry: Interpreted by me showed: sinus tachycardia, low 100s to 140s bpm  Weights: Filed Weights   08/12/16 2100 08/13/16 0000  Weight: 95 lb (43.1 kg) 114 lb 6.7 oz (51.9 kg)     Physical Exam: Blood pressure 104/60, pulse (!) 105, temperature 99 F (37.2 C), temperature source Oral, resp. rate 20, height 5\' 2"  (1.575 m), weight 114 lb 6.7 oz (51.9 kg), SpO2 99 %. Body mass index is 20.93 kg/m. General: Elderly and frail appearing, in no acute distress. Head: Normocephalic, atraumatic, sclera non-icteric, no xanthomas, nares are without discharge.  Neck: Negative for carotid bruits. JVD not elevated. Lungs: Diminished breath sounds bilaterally. Breathing is unlabored. Heart:  Mildly tachycardic with S1 S2. No murmurs, rubs, or gallops appreciated. Abdomen: Soft, non-tender, non-distended  with normoactive bowel sounds. No hepatomegaly. No rebound/guarding. No obvious abdominal masses. Msk:  Strength and tone appear normal for age. Extremities: No clubbing or cyanosis. No edema. Distal pedal pulses are 2+ and equal bilaterally. Neuro: Alert and oriented X 3. No facial asymmetry. No focal deficit. Moves all extremities spontaneously. Psych:  Responds to questions appropriately with a normal affect.    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 NSTEMI: -Presenting symptoms/signs exactly the same as her 10/2012 episode that lead to PCI as above -Currently chest pain free (never had chest pain in 10/2012 or this episode) -Continue to cycle troponin until peak and down trend -Start heparin gtt -Check TTE to evaluate LVEF and wall motion -Will likely need LHC on 5/11 given the NSTEMI and her history (orders placed) -ASA 81 mg (took 325 at home) -As BP allows, start beta blocker (Toprol or Coreg) -Continue Crestor -Risks and benefits of cardiac catheterization have been discussed with the patient including risks of bleeding, bruising, infection, kidney damage, stroke, heart attack, and death. The patient understands these risks and is willing to proceed with the procedure. All questions have been answered and concerns listened to  2. DKA/nausea/vomiting/hypotension: -Improving -Similar scenario in 10/2012 -Diabetes coordinator on board -Per IM  3. CAD as above: -As above  4. Sinus tachycardia: -Likely in the setting of the above -She does live a fairly  sedentary lifestyle, cannot exclude PE -Evaluate echo for dilated RV  -If indicted, consider VQ scan/ CTA chest PE protocol   5. Hypokalemia: -Replete to goal of 4.0  6. Hypomagnesemia: -Improving   7. AKI: -Improved with IV fluids  8. Chondrosarcoma: -Followed by oncology    Signed, Christell Faith, PA-C [place] Pager: [phone] 08/13/2016, 3:24 PM

## 2016-08-13 NOTE — Progress Notes (Signed)
   Results for Diana Frost, Diana Frost (MRN [id number]) as of 08/13/2016 14:55  Ref. Range 08/13/2016 07:10 08/13/2016 08:33 08/13/2016 09:37 08/13/2016 11:34 08/13/2016 12:34  Glucose-Capillary Latest Ref Range: 65 - 99 mg/dL 134 (H) 104 (H) 74 44 (LL) 132 (H)     Since the patient has had Lantus this morning, she would not be able to transition back to the insulin pump until 08/14/16 at 7am. Patient must have all her pump supplies at the bedside and she must change her site before restarting the pump.   I have spoken to the patient and her daughter- the pump supplies are not at the bedside- her daughter is willing to to get these supplies for her to begin.  Reviewed sick day care with the patient.  She reports she continued to bolus last night when the blood sugars were high.  Insulin pump patients must change sites or give injections via a syringe when blood sugars repeatedly do not come down because there may be a problem with the insertion set.  If the patient does not return to the insulin pump tomorrow am, consider decreasing Lantus insulin to 10 units qam.  Gentry Fitz, RN, BA, MHA, CDE Diabetes Coordinator Inpatient Diabetes Program  [phone] (Team Pager) [phone] ([place]) 08/13/2016 4:17 PM

## 2016-08-13 NOTE — Progress Notes (Addendum)
Inpatient Diabetes Program Recommendations  AACE/ADA: New Consensus Statement on Inpatient Glycemic Control (2015)  Target Ranges:  Prepandial:   less than 140 mg/dL      Peak postprandial:   less than 180 mg/dL (1-2 hours)      Critically ill patients:  140 - 180 mg/dL   Lab Results  Component Value Date   GLUCAP 134 (H) 08/13/2016    Review of Glycemic Control  Results for MARILLA, BODDY (MRN [id number]) as of 08/13/2016 08:28  Ref. Range 08/13/2016 02:59 08/13/2016 03:55 08/13/2016 04:59 08/13/2016 06:07 08/13/2016 07:10  Glucose-Capillary Latest Ref Range: 65 - 99 mg/dL 290 (H) 265 (H) 228 (H) 162 (H) 134 (H)   06/30/16 Increase Humalog to 7 units immediately after lunch.  Take Humalog 3 units immediately after a snack. Take Lantus 12 units at bedtime regardless of the glucose level.    Transitioned back to pump on 07/01/16  Basal rates - 12 a.m. 0.45 (was 0.5) u/h TBD = 10.8 units (was 12) Insulin to carbohydrate ratios - 12 a.m. 1:15, 5 p.m. 1:20 (was 1:15)  Insulin sensitivity factor - 12 a.m. 100, 5 a.m. 75, 10 p.m. 100 Active insulin duration = 4 hours BG targets = 12 a.m. 120-150 (was 135-135) mg/dL    MD visit 08/11/16 Insulin pump on.  No changes to pump settings. Continuous glucose monitor started.   Current settings are: Basal rates - 12 a.m. 0.55, 6 p.m. 0.5 u/h TBD = 12.9 units Insulin to carbohydrate ratios - 12 a.m. 1:15, 5 p.m. 1:20  Insulin sensitivity factor - 12 a.m. 100, 5 a.m. 75, 10 p.m. 100 Active insulin duration = 4 hours BG targets = 12 a.m. 120-150 mg/dL  2. Calibrate sensor 2-4 times per day with fingerstick.   For now, while patient is in hospital;  Add Humalog 4 units tid with meals   Change Novolog correction scale to sensitive tid  Add Novolog 0-5 units qhs  Lantus 13 units qam  Since the patient has had Lantus this morning, she would not be able to transition back to the insulin pump until 08/14/16 at 7am.  Patient must have all  her pump supplies at the bedside and she must change her site before restarting the pump.   Patient should call her endocrinologist to notify them she is in hospital.   Gentry Fitz, RN, [place], [place], CDE Diabetes Coordinator Inpatient Diabetes Program  [phone] (Team Pager) [phone] ([place]) 08/13/2016 8:42 AM

## 2016-08-13 NOTE — Progress Notes (Signed)
NP made aware of patient's blood pressure. NP is fine with a MAP greater than or equal to 60, and SBP greater than to equal to 90.

## 2016-08-13 NOTE — Progress Notes (Signed)
[facility] for heparin bolus and drip Indication: chest pain/ACS  Allergies  Allergen Reactions  . Influenza Vaccines Shortness Of Breath  . Percocet [Oxycodone-Acetaminophen] Nausea And Vomiting  . Statins     Joint pain  . Sulfa Antibiotics Nausea And Vomiting    Patient Measurements: Height: 5\' 2"  (157.5 cm) Weight: 114 lb 6.7 oz (51.9 kg) IBW/kg (Calculated) : 50.1 Heparin Dosing Weight: 52 kg  Vital Signs: Temp: 99 F (37.2 C) (05/10 0900) Temp Source: Oral (05/10 0900) BP: 104/60 (05/10 1100) Pulse Rate: 105 (05/10 1100)  Labs:  Recent Labs  08/12/16 2100 08/13/16 0026 08/13/16 0435 08/13/16 0805 08/13/16 1137  HGB 10.9*  --   --   --   --   HCT 33.3*  --   --   --   --   PLT 221  --   --   --   --   CREATININE 1.41*  --  0.98 0.89 0.84  TROPONINI  --  0.16* 0.84*  --  1.09*    Estimated Creatinine Clearance: 48.6 mL/min (by C-G formula based on SCr of 0.84 mg/dL).   Medical History: Past Medical History:  Diagnosis Date  . Coronary artery disease   . Diabetes mellitus without complication (HCC)    insulin pump  . Headache    since ear problem started  . Hypertension   . Myocardial infarction ([place]) 11/01/13    Medications:  Scheduled:  . aspirin EC  81 mg Oral Daily  . chlorhexidine  15 mL Mouth Rinse BID  . famotidine  20 mg Oral Daily  . feeding supplement (GLUCERNA SHAKE)  237 mL Oral TID BM  . FLUoxetine  40 mg Oral QPM  . heparin  3,000 Units Intravenous Once  . insulin aspart  0-5 Units Subcutaneous QHS  . insulin aspart  0-9 Units Subcutaneous TID WC  . insulin glargine  13 Units Subcutaneous Daily  . mouth rinse  15 mL Mouth Rinse q12n4p  . multivitamin  15 mL Oral Daily  . potassium chloride  40 mEq Oral Once  . rosuvastatin  10 mg Oral QPM   Infusions:  . sodium chloride    . heparin      Assessment: Pharmacy consulted to dose heparin bolus and drip in this 52 yoF with NSTEMI. Pt has h/o  CAD with NSTEMI in setting of DKA in 10/2012. Pt [facility] with AMS and blood glucose of > 700 with mildly elevated troponin. Troponin now = 1.09. Pt not on anticoag PTA. Pt did receive enoxaparin 30 mg at midnight (~16 hours ago). Baseline labs have been ordered.  Goal of Therapy:  Heparin level 0.3-0.7 units/ml Monitor platelets by anticoagulation protocol: Yes   Plan:  Give 3000 units bolus x 1 Start heparin infusion at 600 units/hr Check anti-Xa level in 8 hours and daily while on heparin Continue to monitor H&H and platelets  Darrow Bussing, PharmD Pharmacy Resident 08/13/2016 4:09 PM

## 2016-08-14 ENCOUNTER — Encounter: Payer: Self-pay | Admitting: Cardiovascular Disease

## 2016-08-14 ENCOUNTER — Encounter
Admission: EM | Disposition: A | Discharge status: Home / Self Care | Payer: Self-pay | Source: Home / Self Care | Attending: Specialist

## 2016-08-14 DIAGNOSIS — I25118 Atherosclerotic heart disease of native coronary artery with other forms of angina pectoris: Secondary | ICD-10-CM

## 2016-08-14 DIAGNOSIS — E440 Moderate protein-calorie malnutrition: Secondary | ICD-10-CM | POA: Insufficient documentation

## 2016-08-14 HISTORY — PX: LEFT HEART CATH AND CORONARY ANGIOGRAPHY: CATH118249

## 2016-08-14 LAB — BASIC METABOLIC PANEL
ANION GAP: 5 (ref 5–15)
ANION GAP: 9 (ref 5–15)
BUN: 12 mg/dL (ref 6–20)
BUN: 12 mg/dL (ref 6–20)
CHLORIDE: 111 mmol/L (ref 101–111)
CHLORIDE: 103 mmol/L (ref 101–111)
CO2: 22 mmol/L (ref 22–32)
CO2: 21 mmol/L — ABNORMAL LOW (ref 22–32)
Calcium: 8 mg/dL — ABNORMAL LOW (ref 8.9–10.3)
Calcium: 8.2 mg/dL — ABNORMAL LOW (ref 8.9–10.3)
Creatinine, Ser: 0.63 mg/dL (ref 0.44–1.00)
Creatinine, Ser: 0.7 mg/dL (ref 0.44–1.00)
GFR calc Af Amer: >60 mL/min (ref >60)
GFR calc non Af Amer: >60 mL/min (ref >60)
Glucose, Bld: 182 mg/dL — ABNORMAL HIGH (ref 65–99)
Glucose, Bld: 302 mg/dL — ABNORMAL HIGH (ref 65–99)
Potassium: 3.9 mmol/L (ref 3.5–5.1)
Potassium: 3.9 mmol/L (ref 3.5–5.1)
SODIUM: 138 mmol/L (ref 135–145)
SODIUM: 133 mmol/L — AB (ref 135–145)

## 2016-08-14 LAB — CBC
HCT: 29.7 % — ABNORMAL LOW (ref 35.0–47.0)
Hemoglobin: 10.2 g/dL — ABNORMAL LOW (ref 12.0–16.0)
MCH: 33.8 pg (ref 26.0–34.0)
MCHC: 34.2 g/dL (ref 32.0–36.0)
MCV: 98.7 fL (ref 80.0–100.0)
PLATELETS: 184 10*3/uL (ref 150–440)
RBC: 3.01 MIL/uL — ABNORMAL LOW (ref 3.80–5.20)
RDW: 13 % (ref 11.5–14.5)
WBC: 6.1 10*3/uL (ref 3.6–11.0)

## 2016-08-14 LAB — GLUCOSE, CAPILLARY
GLUCOSE-CAPILLARY: 184 mg/dL — AB (ref 65–99)
GLUCOSE-CAPILLARY: 162 mg/dL — AB (ref 65–99)
Glucose-Capillary: 237 mg/dL — ABNORMAL HIGH (ref 65–99)
Glucose-Capillary: 335 mg/dL — ABNORMAL HIGH (ref 65–99)
Glucose-Capillary: 282 mg/dL — ABNORMAL HIGH (ref 65–99)
Glucose-Capillary: 214 mg/dL — ABNORMAL HIGH (ref 65–99)

## 2016-08-14 LAB — MAGNESIUM: MAGNESIUM: 1.8 mg/dL (ref 1.7–2.4)

## 2016-08-14 LAB — TROPONIN I
TROPONIN I: 0.64 ng/mL — AB (ref <0.03)
Troponin I: 0.77 ng/mL (ref <0.03)

## 2016-08-14 LAB — HEPARIN LEVEL (UNFRACTIONATED)
HEPARIN UNFRACTIONATED: 0.28 [IU]/mL — AB (ref 0.30–0.70)
Heparin Unfractionated: <0.1 IU/mL — ABNORMAL LOW (ref 0.30–0.70)

## 2016-08-14 SURGERY — LEFT HEART CATH AND CORONARY ANGIOGRAPHY
Anesthesia: Moderate Sedation

## 2016-08-14 MED ORDER — ROSUVASTATIN CALCIUM 20 MG PO TABS
20.0000 mg | ORAL_TABLET | Freq: Every evening | ORAL | Status: DC
  Administered 2016-08-14: 20 mg via ORAL
  Filled 2016-08-14: qty 1

## 2016-08-14 MED ORDER — SODIUM CHLORIDE 0.9 % WEIGHT BASED INFUSION: 1.0000 mL/kg/h | INTRAVENOUS | Status: DC

## 2016-08-14 MED ORDER — FENTANYL CITRATE (PF) 100 MCG/2ML IJ SOLN
INTRAMUSCULAR | Status: DC | PRN
  Administered 2016-08-14: 25 ug via INTRAVENOUS

## 2016-08-14 MED ORDER — SODIUM CHLORIDE 0.9 % IV SOLN: 250.0000 mL | INTRAVENOUS | Status: DC | PRN

## 2016-08-14 MED ORDER — MIDAZOLAM HCL 2 MG/2ML IJ SOLN
INTRAMUSCULAR | Status: DC | PRN
  Administered 2016-08-14: 1 mg via INTRAVENOUS

## 2016-08-14 MED ORDER — INSULIN PUMP
Freq: Three times a day (TID) | SUBCUTANEOUS | Status: DC
  Administered 2016-08-14 (×2): via SUBCUTANEOUS
  Administered 2016-08-15: 0.1 via SUBCUTANEOUS
  Administered 2016-08-15: 3.5 via SUBCUTANEOUS
  Filled 2016-08-14: qty 1

## 2016-08-14 MED ORDER — INSULIN GLARGINE 100 UNIT/ML ~~LOC~~ SOLN
10.0000 [IU] | Freq: Every day | SUBCUTANEOUS | Status: DC
  Filled 2016-08-14: qty 0.1

## 2016-08-14 MED ORDER — INSULIN ASPART 100 UNIT/ML ~~LOC~~ SOLN
1000.0000 [IU] | SUBCUTANEOUS | Status: DC | PRN
  Filled 2016-08-14: qty 10

## 2016-08-14 MED ORDER — HEPARIN BOLUS VIA INFUSION
800.0000 [IU] | Freq: Once | INTRAVENOUS | Status: AC
  Administered 2016-08-14: 800 [IU] via INTRAVENOUS
  Filled 2016-08-14: qty 800

## 2016-08-14 MED ORDER — INSULIN ASPART 100 UNIT/ML ~~LOC~~ SOLN: 4.0000 [IU] | Freq: Three times a day (TID) | SUBCUTANEOUS | Status: DC

## 2016-08-14 MED ORDER — SODIUM CHLORIDE 0.9% FLUSH: 3.0000 mL | INTRAVENOUS | Status: DC | PRN

## 2016-08-14 MED ORDER — HEPARIN (PORCINE) IN NACL 2-0.9 UNIT/ML-% IJ SOLN
INTRAMUSCULAR | Status: AC
  Filled 2016-08-14: qty 500

## 2016-08-14 MED ORDER — ASPIRIN 81 MG PO CHEW: 81.0000 mg | CHEWABLE_TABLET | ORAL | Status: DC

## 2016-08-14 MED ORDER — FENTANYL CITRATE (PF) 100 MCG/2ML IJ SOLN
INTRAMUSCULAR | Status: AC
  Filled 2016-08-14: qty 2

## 2016-08-14 MED ORDER — CLOPIDOGREL BISULFATE 75 MG PO TABS
75.0000 mg | ORAL_TABLET | Freq: Every day | ORAL | Status: DC
  Administered 2016-08-14 – 2016-08-15 (×2): 75 mg via ORAL
  Filled 2016-08-14 (×2): qty 1

## 2016-08-14 MED ORDER — IOPAMIDOL (ISOVUE-300) INJECTION 61%
INTRAVENOUS | Status: DC | PRN
  Administered 2016-08-14: 100 mL via INTRA_ARTERIAL

## 2016-08-14 MED ORDER — SODIUM CHLORIDE 0.9% FLUSH: 3.0000 mL | Freq: Two times a day (BID) | INTRAVENOUS | Status: DC

## 2016-08-14 MED ORDER — MIDAZOLAM HCL 2 MG/2ML IJ SOLN
INTRAMUSCULAR | Status: AC
  Filled 2016-08-14: qty 2

## 2016-08-14 MED ORDER — METOPROLOL TARTRATE 25 MG PO TABS
12.5000 mg | ORAL_TABLET | Freq: Two times a day (BID) | ORAL | Status: DC
  Administered 2016-08-14 – 2016-08-15 (×3): 12.5 mg via ORAL
  Filled 2016-08-14 (×3): qty 1

## 2016-08-14 MED ORDER — SODIUM CHLORIDE 0.9 % WEIGHT BASED INFUSION: 3.0000 mL/kg/h | INTRAVENOUS | Status: DC

## 2016-08-14 MED ORDER — PHENOL 1.4 % MT LIQD
2.0000 | OROMUCOSAL | Status: DC | PRN
  Filled 2016-08-14: qty 177

## 2016-08-14 MED ORDER — INSULIN ASPART 100 UNIT/ML ~~LOC~~ SOLN: 1.0000 [IU] | Freq: Three times a day (TID) | SUBCUTANEOUS | Status: DC

## 2016-08-14 MED ORDER — ONDANSETRON HCL 4 MG/2ML IJ SOLN: 4.0000 mg | Freq: Four times a day (QID) | INTRAMUSCULAR | Status: DC | PRN

## 2016-08-14 SURGICAL SUPPLY — 11 items
CATH INFINITI 5FR ANG PIGTAIL (CATHETERS) ×3 IMPLANT
CATH INFINITI 5FR JL4 (CATHETERS) ×3 IMPLANT
CATH INFINITI JR4 5F (CATHETERS) ×3 IMPLANT
DEVICE CLOSURE MYNXGRIP 5F (Vascular Products) ×3 IMPLANT
GUIDEWIRE 3MM J TIP .035 145 (WIRE) ×3 IMPLANT
KIT MANI 3VAL PERCEP (MISCELLANEOUS) ×3 IMPLANT
NEEDLE PERC 18GX7CM (NEEDLE) ×3 IMPLANT
NEEDLE SMART 18G ACCESS (NEEDLE) ×3 IMPLANT
PACK CARDIAC CATH (CUSTOM PROCEDURE TRAY) ×3 IMPLANT
SHEATH AVANTI 5FR X 11CM (SHEATH) ×3 IMPLANT
SHEATH AVANTI 6FR X 11CM (SHEATH) IMPLANT

## 2016-08-14 NOTE — Progress Notes (Signed)
Pt is alert, oriented and able to make needs known. Denies pain. VSS. On room air. Able to transfer to [employer] with assist of one. Continent of urine but does have urgency. Patient is utilizing personal insulin pump. Contract signed and paper at bedside for patient to fill out. Patient complained of a sore throat earlier this shift and received an order of Chloraseptic Spray that was positive for relief.  Report called to 2A, Jasmine, Therapist, sports. Patient packed and awaiting transport.

## 2016-08-14 NOTE — Progress Notes (Signed)
Cardiac cath  Moderate LAD, LCX and RCA disease, Patent stents in LAD, RCA No intervention needed  Recommendations:  Would increase crestor up to 20 mg daily, ASA 81 daily Add plavix given NSTEMI, diffuse moderate disease, several stents, high risk given diabetes Add metoprolol 25 mg po BID F/u [facility] unless she prefers to transfer to [employer], Esmond Plants, MD, Ph.D [facility]

## 2016-08-14 NOTE — Care Management (Signed)
Patient is being weaned off insulin  drip.  Insulin pump can not be resume yet. Cardiac cath performed and no intervention.  finding will be managed medically

## 2016-08-14 NOTE — Plan of Care (Signed)
Problem: Safety: Goal: Ability to remain free from injury will improve Outcome: Progressing Pt doing well with minimally assisted transfers. Tolerates sitting up well.   Problem: Pain Managment: Goal: General experience of comfort will improve Outcome: Progressing Patient on RA, resting in chair with feet up. Pain manageable.

## 2016-08-14 NOTE — Progress Notes (Signed)
Pharmacy Consult for Electrolyte Monitoring Indication: Hypokalemia (DKA - pt was on insulin drip)  Allergies  Allergen Reactions  . Influenza Vaccines Shortness Of Breath  . Percocet [Oxycodone-Acetaminophen] Nausea And Vomiting  . Statins     Joint pain  . Sulfa Antibiotics Nausea And Vomiting    Patient Measurements: Height: 5\' 2"  (157.5 cm) Weight: 114 lb 6.7 oz (51.9 kg) IBW/kg (Calculated) : 50.1  Vital Signs: Temp: 98.2 F (36.8 C) (05/11 0750) Temp Source: Oral (05/11 0750) BP: 129/71 (05/11 1200) Pulse Rate: 103 (05/11 1200) Intake/Output from previous day: 05/10 0701 - 05/11 0700 In: 1594 [P.O.:420; I.V.:1072; IV Piggyback:50] Out: -  Intake/Output from this shift: Total I/O In: -  Out: 800 [Urine:800]  Labs:  Recent Labs  08/12/16 2100 08/13/16 0026  08/13/16 0805 08/13/16 1137 08/13/16 1610 08/14/16 0521  WBC 12.1*  --   --   --   --   --  6.1  HGB 10.9*  --   --   --   --   --  10.2*  HCT 33.3*  --   --   --   --   --  29.7*  PLT 221  --   --   --   --   --  184  APTT  --   --   --   --   --  30  --   CREATININE 1.41*  --   < > 0.89 0.84  --  0.63  MG  --  1.6*  --   --  1.9  --  1.8  PHOS  --  5.7*  --   --  3.1  --   --   ALBUMIN 3.6  --   --   --   --   --   --   PROT 6.3*  --   --   --   --   --   --   AST 63*  --   --   --   --   --   --   ALT 13*  --   --   --   --   --   --   ALKPHOS 75  --   --   --   --   --   --   BILITOT 1.9*  --   --   --   --   --   --   < > = values in this interval not displayed. Estimated Creatinine Clearance: 51 mL/min (by C-G formula based on SCr of 0.63 mg/dL).  Potassium  Date Value Ref Range Status  08/14/2016 3.9 3.5 - 5.1 mmol/L Final  07/01/2014 4.3 mmol/L Final    Comment:    3.5-5.1 NOTE: New Reference Range  06/12/14     Medical History: Past Medical History:  Diagnosis Date  . Coronary artery disease   . Diabetes mellitus without complication (HCC)    insulin pump  . Headache    since ear problem started  . Hypertension   . Myocardial infarction ([place]) 11/01/13    Assessment: 71 y/o F admitted with DKA admitted to the ICU for insulin infusion. Insulin drip was stopped 5/10 am.   Plan:  Patient is now off insulin drip with stable potassium level. Will sign off at this point. Thank you for the consult.   Ulice Dash, PharmD Clinical Pharmacist  08/14/2016 1:22 PM

## 2016-08-14 NOTE — Progress Notes (Signed)
[facility] [place] NAME: Diana Frost    MR#:  [id number]  DATE OF BIRTH:  July 27, 1945  SUBJECTIVE:   Pt. Here due to N/V and vague abdominal pain and noted to be in acute Diabetic Ketoacidosis. Hypotension improved.  Off Insulin gtt and AG closed. NO N/V.  S/p Cardiac cath today due to elevated Trop/NSTEMI and showing no significant CAD that needed intervention.   Seen post-cath and not having any chest pain or any complaints. To be started back on her insulin pump today.   REVIEW OF SYSTEMS:    Review of Systems  Constitutional: Negative for chills and fever.  HENT: Negative for congestion and tinnitus.   Eyes: Negative for blurred vision and double vision.  Respiratory: Negative for cough, shortness of breath and wheezing.   Cardiovascular: Negative for chest pain, orthopnea and PND.  Gastrointestinal: Negative for abdominal pain, diarrhea, nausea and vomiting.  Genitourinary: Negative for dysuria and hematuria.  Neurological: Negative for dizziness, sensory change and focal weakness.  All other systems reviewed and are negative.   Nutrition: Carb control Tolerating Diet: Yes Tolerating PT: Await Eval.   DRUG ALLERGIES:   Allergies  Allergen Reactions  . Influenza Vaccines Shortness Of Breath  . Percocet [Oxycodone-Acetaminophen] Nausea And Vomiting  . Statins     Joint pain  . Sulfa Antibiotics Nausea And Vomiting    VITALS:  Blood pressure 132/83, pulse (!) 102, temperature 98.2 F (36.8 C), temperature source Oral, resp. rate 19, height 5\' 2"  (1.575 m), weight 51.9 kg (114 lb 6.7 oz), SpO2 100 %.  PHYSICAL EXAMINATION:   Physical Exam  GENERAL:  71 y.o.-year-old patient lying in bed in no acute distress.  EYES: Pupils equal, round, reactive to light and accommodation. No scleral icterus. Extraocular muscles intact.  HEENT: Head atraumatic, normocephalic. Oropharynx and nasopharynx clear.  NECK:  Supple, no  jugular venous distention. No thyroid enlargement, no tenderness.  LUNGS: Normal breath sounds bilaterally, no wheezing, rales, rhonchi. No use of accessory muscles of respiration.  CARDIOVASCULAR: S1, S2 RRR and tachy. No murmurs, rubs, or gallops.  ABDOMEN: Soft, nontender, nondistended. Bowel sounds present. No organomegaly or mass.  EXTREMITIES: No cyanosis, clubbing or edema b/l.    NEUROLOGIC: Cranial nerves II through XII are intact. No focal Motor or sensory deficits b/l.   PSYCHIATRIC: The patient is alert and oriented x 3.  SKIN: No obvious rash, lesion, or ulcer.    LABORATORY PANEL:   CBC  Recent Labs Lab 08/14/16 0521  WBC 6.1  HGB 10.2*  HCT 29.7*  PLT 184   ------------------------------------------------------------------------------------------------------------------  Chemistries   Recent Labs Lab 08/12/16 2100  08/14/16 0521  NA 132*  < > 138  K 5.9*  < > 3.9  CL 96*  < > 111  CO2 12*  < > 22  GLUCOSE 704*  < > 182*  BUN 24*  < > 12  CREATININE 1.41*  < > 0.63  CALCIUM 8.6*  < > 8.0*  MG  --   < > 1.8  AST 63*  --   --   ALT 13*  --   --   ALKPHOS 75  --   --   BILITOT 1.9*  --   --   < > = values in this interval not displayed. ------------------------------------------------------------------------------------------------------------------  Cardiac Enzymes  Recent Labs Lab 08/14/16 0521  TROPONINI 0.64*   ------------------------------------------------------------------------------------------------------------------  RADIOLOGY:  No results found.   ASSESSMENT AND PLAN:  71 year old female with past medical history of coronary artery disease with previous MI, hypertension, diabetes who presented to the hospital due to acute diabetic ketoacidosis.  1. Acute diabetic ketoacidosis-much improved. Anion gap closed, BS labile but stable. -off insulin drip and now to be started back on her insulin pump today. Appreciated diabetes   Coordinator input -Continue carb-controlled diet, follow blood sugars.  2. Elevated troponin/NSTEMI-patient has had steady elevation in her troponin went up from 0.6 to 1.09. -seen by cardiology and status post cardiac catheterization today showing patent previous stents with diffuse disease but no urgent intervention needed. -Continue aspirin, add Plavix, increase Crestor to 20 mg and add low dose B-blocker. follow-up with cardiology as an outpatient in next few weeks. Clinically asymptomatic presently.  3. Hypokalemia - due to pt. Being on insulin gtt and also due to DKA - resolved w/ supplementation.   4. Depression-continue Prozac.  5. Hyperlipidemia-continue Crestor and increase dose as mentioned above.     All the records are reviewed and case discussed with Care Management/Social Worker. Management plans discussed with the patient, family and they are in agreement.  CODE STATUS: DO NOT RESUSCITATE  DVT Prophylaxis: Lovenox  TOTAL TIME TAKING CARE OF THIS PATIENT: 30 minutes.   POSSIBLE D/C IN 1-2 DAYS, DEPENDING ON CLINICAL CONDITION.   Henreitta Leber M.D on 08/14/2016 at 2:25 PM  Between 7am to 6pm - Pager - [phone]  After 6pm go to [email or web address] - [employer] [facility]  Office  [phone]  CC: Primary care physician; Valera Castle, MD

## 2016-08-14 NOTE — Progress Notes (Signed)
Pt arrived back to room from Cath lab.  Heparin and NS infusions stopped in cath lab. Pt somnolent, but arouses to verbal stimuli, and is alert and oriented.

## 2016-08-14 NOTE — Progress Notes (Signed)
Inpatient Diabetes Program Recommendations  AACE/ADA: New Consensus Statement on Inpatient Glycemic Control (2015)  Target Ranges:  Prepandial:   less than 140 mg/dL      Peak postprandial:   less than 180 mg/dL (1-2 hours)      Critically ill patients:  140 - 180 mg/dL   Lab Results  Component Value Date   GLUCAP 237 (H) 08/14/2016    Review of Glycemic Control  Results for LAM, BJORKLUND (MRN [id number]) as of 08/14/2016 07:53  Ref. Range 08/13/2016 11:34 08/13/2016 12:34 08/13/2016 16:35 08/13/2016 22:38 08/14/2016 07:35  Glucose-Capillary Latest Ref Range: 65 - 99 mg/dL 44 (LL) 132 (H) 73 142 (H) 237 (H)    MD visit 08/11/16 Insulin pump on.  No changes to pump settings. Continuous glucose monitor started on that date   Current settings are: Basal rates - 12 a.m. 0.55, 6 p.m. 0.5 u/h TBD = 12.9 units Insulin to carbohydrate ratios - 12 a.m. 1:15, 5 p.m. 1:20  Insulin sensitivity factor - 12 a.m. 100, 5 a.m. 75, 10 p.m. 100 Active insulin duration = 4 hours BG targets = 12 a.m. 120-150 mg/dL  2. Calibrate sensor 2-4 times per day with fingerstick.   If the patient remains on basal/ bolus please;   Add Humalog 1 units tid with meals - discontinue current order for Humalog 4 units tid- this is far too much  Decrease Lantus to 10 units qam (20% reduction from home dose)  Patient can transition back to the insulin pump this morning. Patient must have all her pump supplies at the bedside and she must change her site before restarting the pump.    Gentry Fitz, RN, BA, MHA, CDE Diabetes Coordinator Inpatient Diabetes Program  [phone] (Team Pager) [phone] ([place]) 08/13/2016 8:42 AM

## 2016-08-14 NOTE — Progress Notes (Signed)
Pt alert and able to make needs known. Pt has been sleeping most of shift. Pt NPO after MN for heart cath at 830AM. Denies pain this shift. BP drops when patient is sleeping. Will go back up when patient awakens. Continuing heparin drip and NS.  Unfractionated heparin level drawn and order received from pharmacy to increase heparin to 750 units/hr and give a 800 unit bolus. Troponins trending down. Patient will use bedpan. Continent of urine. Requests to wear a brief "just incase".  Second peripheral IV placed in left wrist with attempts x2. Currently saline locked. Safety maintained.

## 2016-08-14 NOTE — Progress Notes (Signed)
Name: Diana Frost MRN: [id number] DOB: 24-Dec-1945    ADMISSION DATE:  08/12/2016 CONSULTATION DATE:  08/13/16  REFERRING MD :  Dr. Pollyann Samples  CHIEF COMPLAINT:  "High Blood Glucose"  BRIEF PATIENT DESCRIPTION: 71 year old female with AMS related to DKA  SIGNIFICANT EVENTS  5/9 >> Patient admitted to the ICU with DKA, on insulin gtt  STUDIES:  none   HISTORY OF PRESENT ILLNESS:  Diana Frost is a 71 year old female with known history of CAD, DM, Headache , HTN and MI.  Patient presented to ER on 5/9 with concerns of Altered mental status and when her blood glucose was checked it was extremely elevated.  Patient's daughter stated that she had been sick lately and her insulin has been changed to pump.  Hospitalist team were called to admit the patient and PCCM team was consulted for further management.  PAST MEDICAL HISTORY :   has a past medical history of Coronary artery disease; Diabetes mellitus without complication ([place]); Headache; Hypertension; and Myocardial infarction ([place]) (11/01/13).  has a past surgical history that includes Cataract extraction bilateral w/ anterior vitrectomy; Abdominal hysterectomy; Coronary angioplasty with stent (11/01/13); Melanoma excision (Right); Back surgery; Myringotomy with tube placement (Left, 08/21/2015); Tonsillectomy; and Nasopharyngeal biopsy (N/A, 11/07/2015). Prior to Admission medications   Medication Sig Start Date End Date Taking? Authorizing Provider  aspirin 81 MG tablet Take 81 mg by mouth daily.   Yes [provider]  FLUoxetine (PROZAC) 40 MG capsule Take 40 mg by mouth every evening.    Yes [provider]  insulin glargine (LANTUS) 100 UNIT/ML injection Inject 13 Units into the skin daily as needed (If pump does not lower blood sugars). As needed if insulin pump is not working    Yes [provider]  insulin lispro (HUMALOG) 100 UNIT/ML injection Inject 13 Units into the skin over 24 hr. In insulin pump     Yes [provider]  ondansetron (ZOFRAN-ODT) 4 MG disintegrating tablet Take 4 mg by mouth every 8 (eight) hours as needed for nausea or vomiting.   Yes [provider]  rosuvastatin (CRESTOR) 10 MG tablet Take 10 mg by mouth every evening.    Yes [provider]  amoxicillin-clavulanate (AUGMENTIN) 875-125 MG tablet Take 1 tablet by mouth 2 (two) times daily. Patient not taking: Reported on 08/12/2016 11/07/15   Carloyn Manner, MD  Glucagon, rDNA, (GLUCAGON EMERGENCY IJ) Inject as directed as needed (for low blood sugar).    [provider]  traMADol (ULTRAM) 50 MG tablet Take 1 tablet (50 mg total) by mouth every 6 (six) hours as needed. Patient not taking: Reported on 08/12/2016 11/07/15   Carloyn Manner, MD   Allergies  Allergen Reactions  . Influenza Vaccines Shortness Of Breath  . Percocet [Oxycodone-Acetaminophen] Nausea And Vomiting  . Statins     Joint pain  . Sulfa Antibiotics Nausea And Vomiting    FAMILY HISTORY:  family history includes Breast cancer in her maternal grandfather; Lung cancer in her father. SOCIAL HISTORY:  reports that she quit smoking about 18 years ago. Her smoking use included Cigarettes. She has never used smokeless tobacco. She reports that she drinks about 10.2 oz of alcohol per week . She reports that she does not use drugs.  REVIEW OF SYSTEMS:   Constitutional: Negative for fever, chills, weight loss, malaise/fatigue and diaphoresis.  HENT: Negative for hearing loss, ear pain, nosebleeds, congestion, sore throat, neck pain, tinnitus and ear discharge.   Eyes:  Negative for blurred vision, double vision, photophobia, pain, discharge and redness.  Respiratory: Negative for cough, hemoptysis, sputum production, shortness of breath, wheezing and stridor.   Cardiovascular: Negative for chest pain, palpitations, orthopnea, claudication, leg swelling and PND.  Gastrointestinal: Negative for heartburn, nausea, vomiting,  abdominal pain, diarrhea, constipation, blood in stool and melena.  Genitourinary: Negative for dysuria, urgency, frequency, hematuria and flank pain.  Musculoskeletal: Negative for myalgias, back pain, joint pain and falls.  Skin: Negative for itching and rash.  Neurological: Negative for dizziness, tingling, tremors, sensory change, speech change, focal weakness, seizures, loss of consciousness, weakness and headaches.  Endo/Heme/Allergies: Negative for environmental allergies and polydipsia. Does not bruise/bleed easily.  SUBJECTIVE: Patient had an uneventful night.  VITAL SIGNS: Temp:  [97.9 F (36.6 C)-99 F (37.2 C)] 97.9 F (36.6 C) (05/11 0200) Pulse Rate:  [92-122] 92 (05/11 0300) Resp:  [8-25] 20 (05/11 0300) BP: (67-122)/(45-71) 122/71 (05/11 0300) SpO2:  [91 %-100 %] 97 % (05/11 0300)  PHYSICAL EXAMINATION: General:  Elderly white female, in no acute distress Neuro:  Awake, Alert and oriented HEENT:  AT,[place],No JVD Cardiovascular: S1S2,Tachycardic, no m/r/g Lungs:  Clear bilaterally, no wheezes,crackles, rhonchi noted Abdomen: Soft,NT,ND,+BS Musculoskeletal:  No edema, cyanosis noted Skin:  Warm ,dry and Intact   Recent Labs Lab 08/13/16 0435 08/13/16 0805 08/13/16 1137 08/13/16 1720  NA 140 139 138  --   K 3.3* 3.1* 3.3* 3.8  CL 113* 112* 113*  --   CO2 21* 22 23  --   BUN 19 20 20   --   CREATININE 0.98 0.89 0.84  --   GLUCOSE 249* 120* 67  --     Recent Labs Lab 08/12/16 2100  HGB 10.9*  HCT 33.3*  WBC 12.1*  PLT 221   No results found.  ASSESSMENT / PLAN:  Diabetic Ketoacidosis-resolved AGMA-resolved Hyperkalemia-resolved Hyponatremia-resolved Acute Kidney Injury -resolved NSTEMI Chondrosarcoma   Plan Diabetes Cordinator consulted, input appreciated Possible Cardiac Cath today.(5/11) Continue Heparin gtt Cardiology following,input appreciated   Braelyn Bordonaro,AG-ACNP Pulmonary and [place]   08/14/2016, 3:55 AM

## 2016-08-14 NOTE — Progress Notes (Signed)
[place] for heparin bolus and drip Indication: chest pain/ACS       Allergies  Allergen Reactions  . Influenza Vaccines Shortness Of Breath  . Percocet [Oxycodone-Acetaminophen] Nausea And Vomiting  . Statins     Joint pain  . Sulfa Antibiotics Nausea And Vomiting    Patient Measurements: Height: 5\' 2"  (157.5 cm) Weight: 114 lb 6.7 oz (51.9 kg) IBW/kg (Calculated) : 50.1 Heparin Dosing Weight: 52 kg  Vital Signs: Temp: 99 F (37.2 C) (05/10 0900) Temp Source: Oral (05/10 0900) BP: 104/60 (05/10 1100) Pulse Rate: 105 (05/10 1100)  Labs:  Recent Labs (last 2 labs)    Recent Labs  08/12/16 2100 08/13/16 0026 08/13/16 0435 08/13/16 0805 08/13/16 1137  HGB 10.9*  --   --   --   --   HCT 33.3*  --   --   --   --   PLT 221  --   --   --   --   CREATININE 1.41*  --  0.98 0.89 0.84  TROPONINI  --  0.16* 0.84*  --  1.09*      Estimated Creatinine Clearance: 48.6 mL/min (by C-G formula based on SCr of 0.84 mg/dL).   Medical History:     Past Medical History:  Diagnosis Date  . Coronary artery disease   . Diabetes mellitus without complication (HCC)    insulin pump  . Headache    since ear problem started  . Hypertension   . Myocardial infarction ([place]) 11/01/13    Medications:  Scheduled:  . aspirin EC  81 mg Oral Daily  . chlorhexidine  15 mL Mouth Rinse BID  . famotidine  20 mg Oral Daily  . feeding supplement (GLUCERNA SHAKE)  237 mL Oral TID BM  . FLUoxetine  40 mg Oral QPM  . heparin  3,000 Units Intravenous Once  . insulin aspart  0-5 Units Subcutaneous QHS  . insulin aspart  0-9 Units Subcutaneous TID WC  . insulin glargine  13 Units Subcutaneous Daily  . mouth rinse  15 mL Mouth Rinse q12n4p  . multivitamin  15 mL Oral Daily  . potassium chloride  40 mEq Oral Once  . rosuvastatin  10 mg Oral QPM   Infusions:  . sodium chloride    . heparin      Assessment: Pharmacy  consulted to dose heparin bolus and drip in this 37 yoF with NSTEMI. Pt has h/o CAD with NSTEMI in setting of DKA in 10/2012. Pt [facility] with AMS and blood glucose of > 700 with mildly elevated troponin. Troponin now = 1.09. Pt not on anticoag PTA. Pt did receive enoxaparin 30 mg at midnight (~16 hours ago). Baseline labs have been ordered.  Goal of Therapy:  Heparin level 0.3-0.7 units/ml Monitor platelets by anticoagulation protocol: Yes   Plan:  Give 3000 units bolus x 1 Start heparin infusion at 600 units/hr Check anti-Xa level in 8 hours and daily while on heparin Continue to monitor H&H and platelets  5/11 0000 HL 0.28 subtherapeutic. Will rebolus w/ heparin 800 units IV x 1 and will increase rate to 750 units/hr and recheck next HL @ 0800.  Thank you for this consult.  Tobie Lords, PharmD, BCPS Clinical Pharmacist 08/14/2016

## 2016-08-14 NOTE — Progress Notes (Signed)
PT connected her own insulin pump at this time.

## 2016-08-14 NOTE — Progress Notes (Signed)
Pt care assumed, report received.

## 2016-08-15 LAB — GLUCOSE, CAPILLARY
GLUCOSE-CAPILLARY: 140 mg/dL — AB (ref 65–99)
Glucose-Capillary: 161 mg/dL — ABNORMAL HIGH (ref 65–99)
Glucose-Capillary: 229 mg/dL — ABNORMAL HIGH (ref 65–99)

## 2016-08-15 MED ORDER — CLOPIDOGREL BISULFATE 75 MG PO TABS: 75.0000 mg | ORAL_TABLET | Freq: Every day | ORAL | 0 refills | Status: DC

## 2016-08-15 MED ORDER — METOPROLOL TARTRATE 25 MG PO TABS: 12.5000 mg | ORAL_TABLET | Freq: Every day | ORAL | Status: DC

## 2016-08-15 MED ORDER — ROSUVASTATIN CALCIUM 20 MG PO TABS
20.0000 mg | ORAL_TABLET | Freq: Every evening | ORAL | 0 refills | Status: DC
Start: 2016-08-15 — End: 2019-01-07

## 2016-08-15 MED ORDER — METOPROLOL TARTRATE 25 MG PO TABS: 12.5000 mg | ORAL_TABLET | Freq: Two times a day (BID) | ORAL | 0 refills | Status: DC

## 2016-08-15 NOTE — Discharge Instructions (Signed)
Diabetic diet

## 2016-08-15 NOTE — Progress Notes (Signed)
Pt ambulated entire loop with walker on room air. Gait steady. No shortness of breath. Sitting up ;in chair for lunch. Dr sudini advised of this.

## 2016-08-24 NOTE — Discharge Summary (Signed)
[facility] NAME: Diana Frost    MR#:  [id number]  DATE OF BIRTH:  [birth year]  DATE OF ADMISSION:  08/12/2016 ADMITTING PHYSICIAN: Harvie Bridge, DO  DATE OF DISCHARGE: 08/15/2016  2:32 PM  PRIMARY CARE PHYSICIAN: Valera Castle, MD   ADMISSION DIAGNOSIS:  Diabetic ketoacidosis without coma associated with other specified diabetes mellitus ([place]) [E13.10]  DISCHARGE DIAGNOSIS:  Principal Problem:   DKA (diabetic ketoacidoses) ([place]) Active Problems:   Type 1 diabetes mellitus ([place])   Coronary artery disease involving native coronary artery of native heart   Melanoma (HCC)   Non-ST elevation (NSTEMI) myocardial infarction ([place])   Chondrosarcoma (HCC)   AKI (acute kidney injury) ([place])   Hypokalemia   Malnutrition of moderate degree   SECONDARY DIAGNOSIS:   Past Medical History:  Diagnosis Date  . Coronary artery disease   . Diabetes mellitus without complication (HCC)    insulin pump  . Headache    since ear problem started  . Hypertension   . Myocardial infarction [facility]) 11/01/13     ADMITTING HISTORY  Chief Complaint:     Chief Complaint  Patient presents with  . Hyperglycemia  . Emesis  . Abdominal Pain  Please note the entire history is obtained from the patient's emergency department chart, emergency department provider. Patient's personal history is limited by dementia and altered mental status.   HPI: Diana Frost is a 71 y.o. female with a known history of CAD s/p MI, DM, HTN presents to the emergency department for evaluation of Altered mental status.  Patient was in a usual state of health until today when she was found to have increased weakness and lethargy associated with elevated blood sugar. In the emergency department she was found to be in DKA for which admission was requested.  EMS/ED Course: Patient received IV insulin, Zofran, sodium bicarb.   HOSPITAL COURSE:   * DKA.  This was due to missing insulin doses. Patient was treated with IV insulin drip to stepdown unit initially. Once her DKA resolved she was placed back on her insulin pump and blood sugars are well-controlled.  * Non-ST elevation MI. With steady rise in troponins cardiology was consulted. Patient has diffuse CAD with patent prior stents. Patient is on aspirin. Plavix added. Crestor and low-dose beta blocker. Likely small vessel disease not amenable to intervention.  Patient is chest pain free by day of discharge and blood sugars well controlled. Discharged home with cardiac rehabilitation referral.  CONSULTS OBTAINED:  Treatment Team:  Wellington Hampshire, MD  DRUG ALLERGIES:   Allergies  Allergen Reactions  . Influenza Vaccines Shortness Of Breath  . Percocet [Oxycodone-Acetaminophen] Nausea And Vomiting  . Statins     Joint pain  . Sulfa Antibiotics Nausea And Vomiting    DISCHARGE MEDICATIONS:   Discharge Medication List as of 08/15/2016  1:01 PM    START taking these medications   Details  clopidogrel (PLAVIX) 75 MG tablet Take 1 tablet (75 mg total) by mouth daily., Starting Sat 08/15/2016, Normal    metoprolol tartrate (LOPRESSOR) 25 MG tablet Take 0.5 tablets (12.5 mg total) by mouth 2 (two) times daily., Starting Sat 08/15/2016, Normal      CONTINUE these medications which have CHANGED   Details  rosuvastatin (CRESTOR) 20 MG tablet Take 1 tablet (20 mg total) by mouth every evening., Starting Sat 08/15/2016, Normal      CONTINUE these medications which have NOT CHANGED   Details  aspirin 81 MG tablet Take 81 mg by mouth daily., Historical Med    FLUoxetine (PROZAC) 40 MG capsule Take 40 mg by mouth every evening. , Historical Med    insulin glargine (LANTUS) 100 UNIT/ML injection Inject 13 Units into the skin daily as needed (If pump does not lower blood sugars). As needed if insulin pump is not working , Historical Med    insulin lispro (HUMALOG) 100 UNIT/ML injection  Inject 13 Units into the skin over 24 hr. In insulin pump , Historical Med    ondansetron (ZOFRAN-ODT) 4 MG disintegrating tablet Take 4 mg by mouth every 8 (eight) hours as needed for nausea or vomiting., Historical Med    Glucagon, rDNA, (GLUCAGON EMERGENCY IJ) Inject as directed as needed (for low blood sugar)., Historical Med    traMADol (ULTRAM) 50 MG tablet Take 1 tablet (50 mg total) by mouth every 6 (six) hours as needed., Starting Thu 11/07/2015, Print      STOP taking these medications     amoxicillin-clavulanate (AUGMENTIN) 875-125 MG tablet         Today   VITAL SIGNS:  Blood pressure 101/63, pulse 86, temperature 97.9 F (36.6 C), resp. rate 18, height 5\' 2"  (1.575 m), weight 43.2 kg (95 lb 4.8 oz), SpO2 93 %.  I/O:  No intake or output data in the 24 hours ending 08/24/16 1338  PHYSICAL EXAMINATION:  Physical Exam  GENERAL:  72 y.o.-year-old patient lying in the bed with no acute distress.  LUNGS: Normal breath sounds bilaterally, no wheezing, rales,rhonchi or crepitation. No use of accessory muscles of respiration.  CARDIOVASCULAR: S1, S2 normal. No murmurs, rubs, or gallops.  ABDOMEN: Soft, non-tender, non-distended. Bowel sounds present. No organomegaly or mass.  NEUROLOGIC: Moves all 4 extremities. PSYCHIATRIC: The patient is alert and oriented x 3.  SKIN: No obvious rash, lesion, or ulcer.   DATA REVIEW:   CBC No results for input(s): WBC, HGB, HCT, PLT in the last 168 hours.  Chemistries  No results for input(s): NA, K, CL, CO2, GLUCOSE, BUN, CREATININE, CALCIUM, MG, AST, ALT, ALKPHOS, BILITOT in the last 168 hours.  Invalid input(s): GFRCGP  Cardiac Enzymes No results for input(s): TROPONINI in the last 168 hours.  Microbiology Results  Results for orders placed or performed during the hospital encounter of 08/12/16  MRSA PCR Screening     Status: None   Collection Time: 08/12/16 11:45 PM  Result Value Ref Range Status   MRSA by PCR NEGATIVE  NEGATIVE Final    Comment:        The GeneXpert MRSA Assay (FDA approved for NASAL specimens only), is one component of a comprehensive MRSA colonization surveillance program. It is not intended to diagnose MRSA infection nor to guide or monitor treatment for MRSA infections.     RADIOLOGY:  No results found.  Follow up with PCP in 1 week.  Management plans discussed with the patient, family and they are in agreement.  CODE STATUS:  Code Status History    Date Active Date Inactive Code Status Order ID Comments User Context   08/12/2016 11:45 PM 08/15/2016  5:32 PM DNR [id number]  Harvie Bridge, DO Inpatient    Questions for Most Recent Historical Code Status (Order [id number])    Question Answer Comment   In the event of cardiac or respiratory ARREST Do not call a "code blue"    In the event of cardiac or respiratory ARREST Do not perform Intubation, CPR, defibrillation or ACLS  In the event of cardiac or respiratory ARREST Use medication by any route, position, wound care, and other measures to relive pain and suffering. May use oxygen, suction and manual treatment of airway obstruction as needed for comfort.         Advance Directive Documentation     Most Recent Value  Type of Advance Directive  Healthcare Power of Attorney, Living will  Pre-existing out of facility DNR order (yellow form or pink MOST form)  -  "MOST" Form in Place?  -      TOTAL TIME TAKING CARE OF THIS PATIENT ON DAY OF DISCHARGE: more than 30 minutes.   Hillary Bow R M.D on 08/24/2016 at 1:38 PM  Between 7am to 6pm - Pager - [phone]  After 6pm go to [email or web address] - password [email or web address] [facility]  Office  [phone]  CC: Primary care physician; Valera Castle, MD  Note: This dictation was prepared with Dragon dictation along with smaller phrase technology. Any transcriptional errors that result from this process are unintentional.

## 2016-08-25 LAB — BLOOD GAS, VENOUS
Acid-base deficit: 16.1 mmol/L — ABNORMAL HIGH (ref 0.0–2.0)
Bicarbonate: 12 mmol/L — ABNORMAL LOW (ref 20.0–28.0)
PCO2 VEN: 36 mmHg — AB (ref 44.0–60.0)
PH VEN: 7.13 — AB (ref 7.250–7.430)
Patient temperature: 37

## 2016-09-01 ENCOUNTER — Ambulatory Visit (INDEPENDENT_AMBULATORY_CARE_PROVIDER_SITE_OTHER): Payer: Medicare Other | Admitting: Cardiovascular Disease

## 2016-09-01 ENCOUNTER — Encounter: Payer: Self-pay | Admitting: Cardiovascular Disease

## 2016-09-01 VITALS — BP 84/50 | HR 96 | Ht 62.0 in | Wt 100.5 lb

## 2016-09-01 DIAGNOSIS — I25119 Atherosclerotic heart disease of native coronary artery with unspecified angina pectoris: Secondary | ICD-10-CM | POA: Diagnosis not present

## 2016-09-01 DIAGNOSIS — E785 Hyperlipidemia, unspecified: Secondary | ICD-10-CM | POA: Diagnosis not present

## 2016-09-01 DIAGNOSIS — I209 Angina pectoris, unspecified: Secondary | ICD-10-CM | POA: Diagnosis not present

## 2016-09-01 MED ORDER — PANTOPRAZOLE SODIUM 40 MG PO TBEC: 40.0000 mg | DELAYED_RELEASE_TABLET | Freq: Every day | ORAL | 5 refills | Status: DC

## 2016-09-01 NOTE — Patient Instructions (Signed)
Medication Instructions:  Your physician has recommended you make the following change in your medication:  STOP taking metoprolol STOP taking omeprazole START taking protonix 40mg  once daily   Labwork: none  Testing/Procedures: none  Follow-Up: Your physician recommends that you schedule a follow-up appointment with your [facility] cardiologist in 3 months.   Any Other Special Instructions Will Be Listed Below (If Applicable).     If you need a refill on your cardiac medications before your next appointment, please call your pharmacy.

## 2016-09-01 NOTE — Progress Notes (Signed)
Cardiology Office Note   Date:  09/01/2016   ID:  Novelle, Addair [birth year], MRN [id number]  PCP:  Valera Castle, MD  Cardiologist:  [facility] cardiology.  Chief Complaint  Patient presents with  . other    Patient was in hospital 08/12/16. Patient deneis chest pain and SOB. Meds reviewed verbally with patient.       History of Present Illness: Diana Frost is a 71 y.o. female who presents for A follow-up visit regarding coronary artery disease and recent small non-ST elevation myocardial infarction. She is status post three-vessel PCI and stent placement in 2014 at [facility]. She has prolonged history of diabetes, hypertension and hyperlipidemia. 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 She was hospitalized recently with severe hyperglycemia with mildly elevated troponin. She underwent cardiac catheterization which showed patent  stents with moderate left circumflex disease, mid LAD disease and distal RCA disease. Her vessels were overall diffusely diseased but not obstructive. Ejection fraction was normal. Revascularization was not needed. Treatment with Plavix was resumed. She was also placed on small dose metoprolol but her blood pressure is chronically low. She denies any chest pain or shortness of breath. She had dyspepsia that improved significantly with omeprazole and she was able to gain 4 pounds since she started taking the medication.    Past Medical History:  Diagnosis Date  . Coronary artery disease   . Diabetes mellitus without complication (HCC)    insulin pump  . Headache    since ear problem started  . Hypertension   . Myocardial infarction ([place]) 11/01/13    Past Surgical History:  Procedure Laterality Date  . ABDOMINAL HYSTERECTOMY    . BACK SURGERY     tail bone removed after fall/fracture  . CATARACT EXTRACTION BILATERAL W/ ANTERIOR VITRECTOMY    . CORONARY ANGIOPLASTY WITH STENT  PLACEMENT  11/01/13   [facility], stent x2  . LEFT HEART CATH AND CORONARY ANGIOGRAPHY N/A 08/14/2016   Procedure: Left Heart Cath and Coronary Angiography;  Surgeon: Minna Merritts, MD;  Location: [facility];  Service: Cardiovascular;  Laterality: N/A;  . MELANOMA EXCISION Right    arm with skin graft  . MYRINGOTOMY WITH TUBE PLACEMENT Left 08/21/2015   Procedure: MYRINGOTOMY WITH TUBE PLACEMENT;  Surgeon: Carloyn Manner, MD;  Location: [facility];  Service: ENT;  Laterality: Left;  . NASOPHARYNGEAL BIOPSY N/A 11/07/2015   Procedure: NASOPHARYNGEAL BIOPSY;  Surgeon: Carloyn Manner, MD;  Location: [facility];  Service: ENT;  Laterality: N/A;  . TONSILLECTOMY       Current Outpatient Prescriptions  Medication Sig Dispense Refill  . aspirin 81 MG tablet Take 81 mg by mouth daily.    . clopidogrel (PLAVIX) 75 MG tablet Take 1 tablet (75 mg total) by mouth daily. 30 tablet 0  . FLUoxetine (PROZAC) 40 MG capsule Take 40 mg by mouth every evening.     . Glucagon, rDNA, (GLUCAGON EMERGENCY IJ) Inject as directed as needed (for low blood sugar).    . insulin glargine (LANTUS) 100 UNIT/ML injection Inject 13 Units into the skin daily as needed (If pump does not lower blood sugars). As needed if insulin pump is not working     . insulin lispro (HUMALOG) 100 UNIT/ML injection Inject 13 Units into the skin over 24 hr. In insulin pump     . ondansetron (ZOFRAN-ODT) 4 MG disintegrating tablet Take 4 mg by mouth every 8 (  eight) hours as needed for nausea or vomiting.    . pantoprazole (PROTONIX) 40 MG tablet Take 1 tablet (40 mg total) by mouth daily. 30 tablet 5  . rosuvastatin (CRESTOR) 20 MG tablet Take 1 tablet (20 mg total) by mouth every evening. 30 tablet 0   No current facility-administered medications for this visit.     Allergies:   Influenza vaccines; Percocet [oxycodone-acetaminophen]; Statins; and Sulfa antibiotics    Social History:  The patient  reports that she  quit smoking about 18 years ago. Her smoking use included Cigarettes. She has never used smokeless tobacco. She reports that she drinks about 10.2 oz of alcohol per week . She reports that she does not use drugs.   Family History:  The patient's family history includes Breast cancer in her maternal grandfather; Lung cancer in her father.    ROS:  Please see the history of present illness.   Otherwise, review of systems are positive for none.   All other systems are reviewed and negative.    PHYSICAL EXAM: VS:  BP (!) 84/50 (BP Location: Left Arm, Patient Position: Sitting, Cuff Size: Normal)   Pulse 96   Ht 5\' 2"  (1.575 m)   Wt 100 lb 8 oz (45.6 kg)   BMI 18.38 kg/m  , BMI Body mass index is 18.38 kg/m. GEN: Well nourished, well developed, in no acute distress  HEENT: normal  Neck: no JVD, carotid bruits, or masses Cardiac: RRR; no murmurs, rubs, or gallops,no edema  Respiratory:  clear to auscultation bilaterally, normal work of breathing GI: soft, nontender, nondistended, + BS MS: no deformity or atrophy  Skin: warm and dry, no rash Neuro:  Strength and sensation are intact Psych: euthymic mood, full affect Right groin is intact with no hematoma.  EKG:  EKG is ordered today. The ekg ordered today demonstrates normal sinus rhythm with no significant ST or T wave changes.   Recent Labs: 08/12/2016: ALT 13 08/14/2016: BUN 12; Creatinine, Ser 0.70; Hemoglobin 10.2; Magnesium 1.8; Platelets 184; Potassium 3.9; Sodium 133    Lipid Panel No results found for: CHOL, TRIG, HDL, CHOLHDL, VLDL, LDLCALC, LDLDIRECT    Wt Readings from Last 3 Encounters:  09/01/16 100 lb 8 oz (45.6 kg)  08/14/16 95 lb 4.8 oz (43.2 kg)  04/15/16 116 lb (52.6 kg)       No flowsheet data found.    ASSESSMENT AND PLAN:  1.  Coronary artery disease involving native coronary arteries: Recent elevated troponin was likely due to supply demand ischemia in the setting of severe hyperglycemia. Cardiac  catheterization showed patent stents with mild to moderate diffuse nonobstructive disease. Given multiple stents, we have recommended indefinite dual antiplatelet therapy as tolerated. Given her chronically low blood pressure, I discontinued metoprolol.  2. Hyperlipidemia: Continue treatment with rosuvastatin with a target LDL of less than 70.  3. Dyspepsia and GERD symptoms: Improved significantly with omeprazole but given that she is on clopidogrel, I switched her to Protonix 40 mg once daily.   Disposition: The patient normally follows at [facility] cardiology (Dr. Sheppard Coil) and I instructed her to follow-up in 3 months.  Signed,  Kathlyn Sacramento, MD  09/01/2016 12:14 PM    [facility]

## 2017-05-19 IMAGING — CT CT CERVICAL SPINE W/O CM
3 of 7 series · 13 of 33 positions shown, 15 images · non-contrast
Comparison: Brain MRI 10/30/2015 and temporal bone CT 08/15/2015.

CLINICAL DATA: fall with impact to front and back of head. No loss
of consciousness.

EXAM:
CT HEAD WITHOUT CONTRAST
CT CERVICAL SPINE WITHOUT CONTRAST
TECHNIQUE: Multidetector CT imaging of the head and cervical spine was
performed following the standard protocol without intravenous
contrast. Multiplanar CT image reconstructions of the cervical spine
were also generated.

[Series 8: coronal soft tissue · coronal · 0.29mm/px · 3 of 60 slices shown]
[im 15/60  bone]
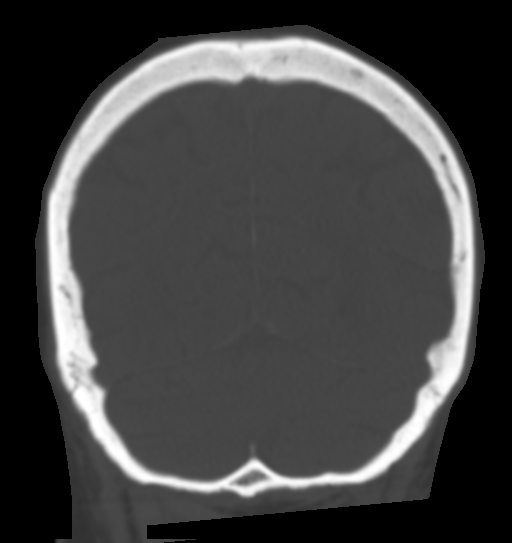
[im 30/60  bone]
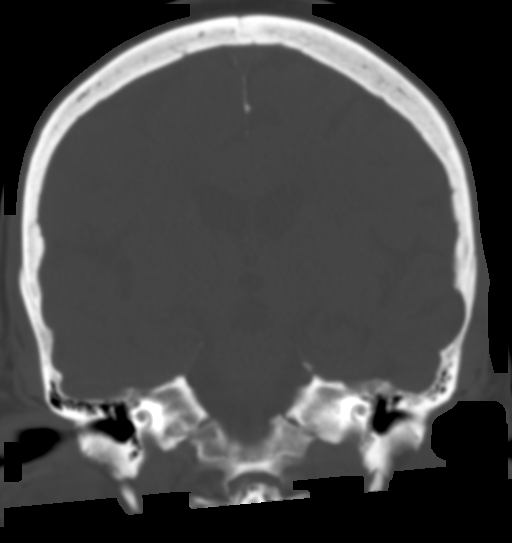
[im 45/60  bone]
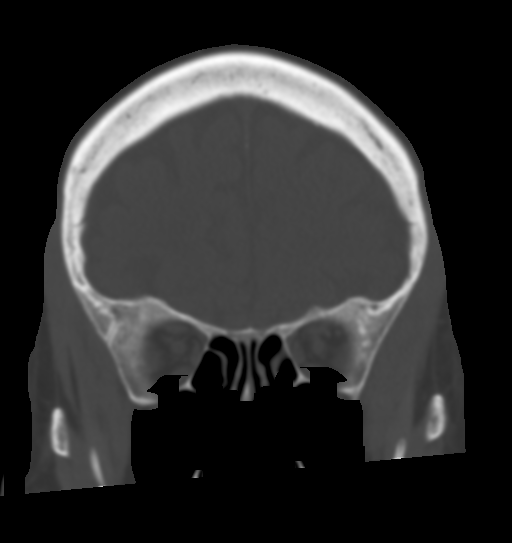

[Series 10: sagittal bone · sagittal · 0.23mm/px · 5 of 61 slices shown]
[im 11/61  bone]
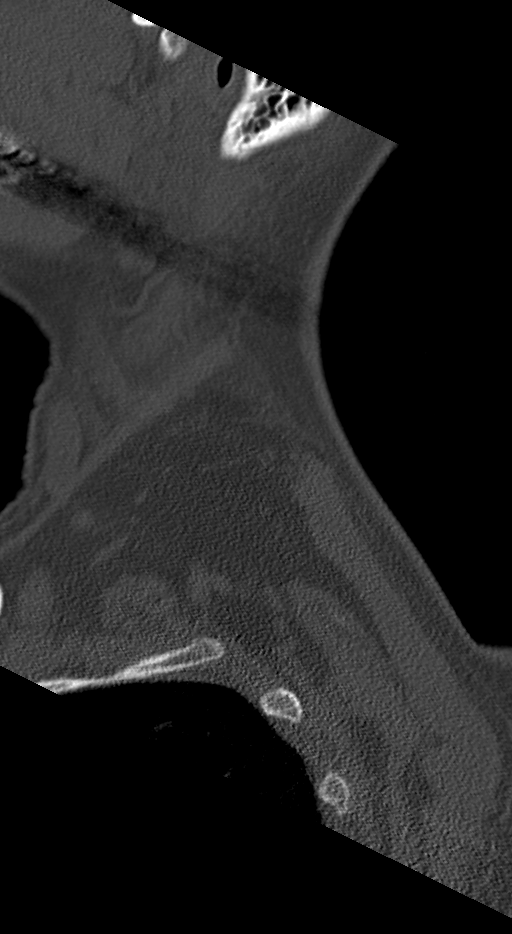
[im 21/61  bone]
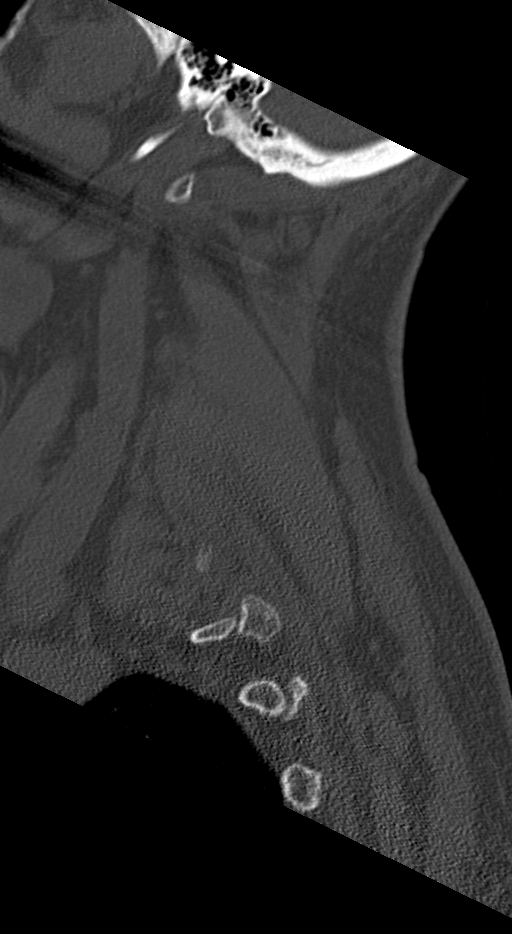
[im 31/61  bone]
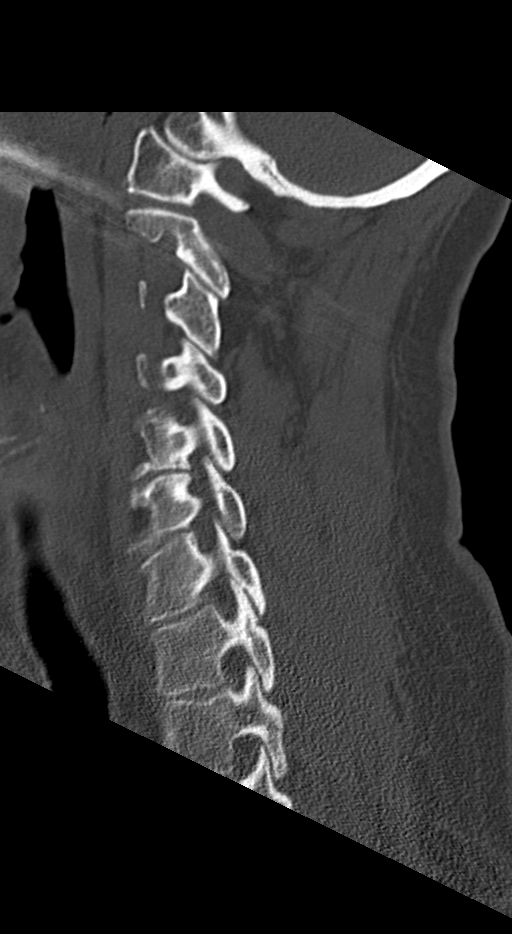
[im 41/61  bone]
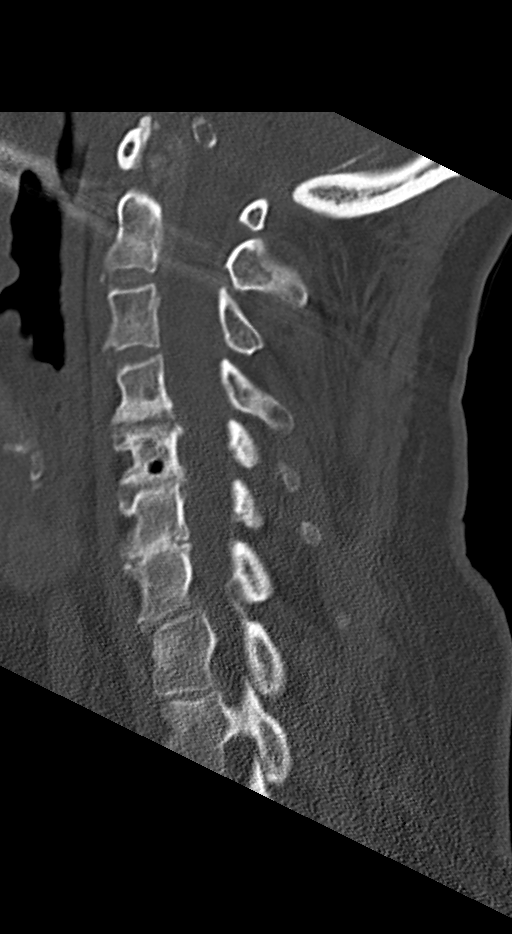
[im 51/61  bone]
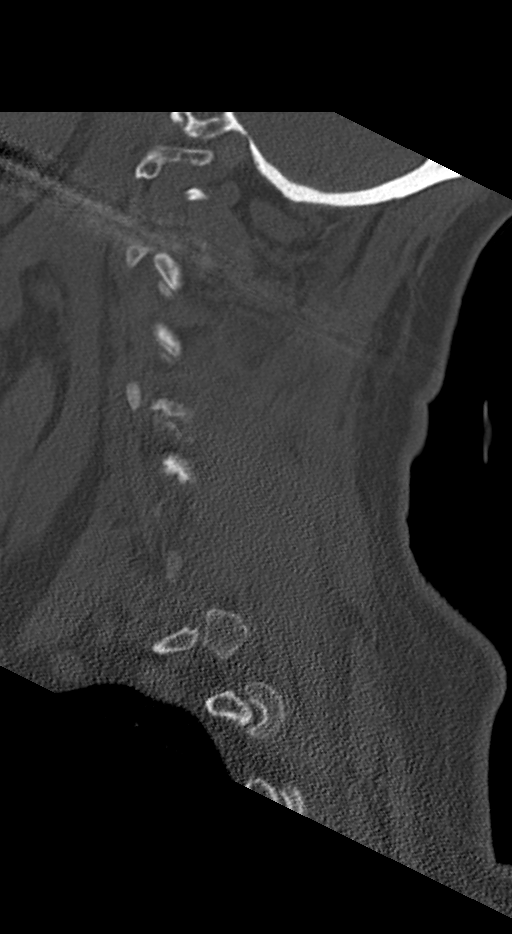

[Series 12: orthogonal bone · axial · 0.23mm/px · z∈[-267,-156]mm · 5 of 97 slices shown, 7 images]
[im 17/97  soft-tissue]
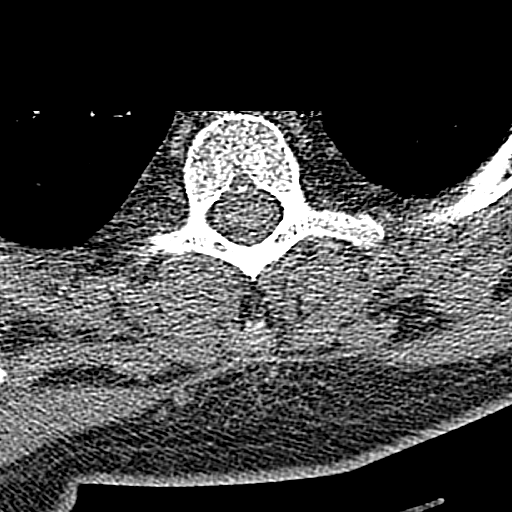
[im 17/97  bone]
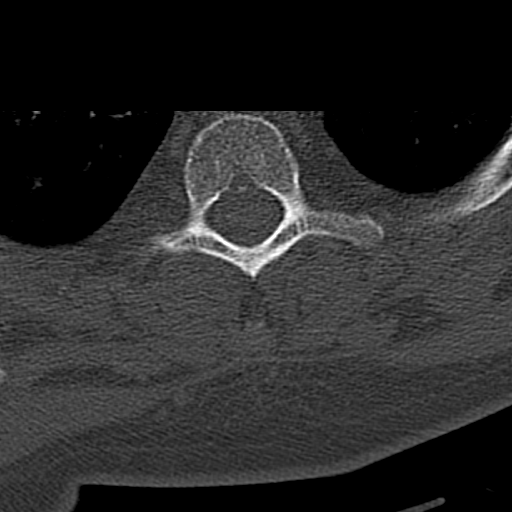
[im 33/97  bone]
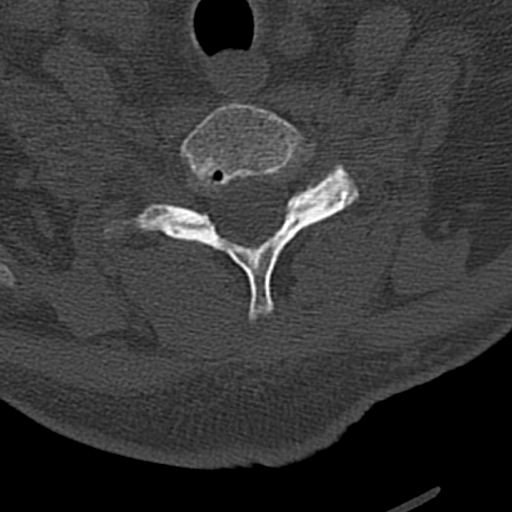
[im 49/97  bone]
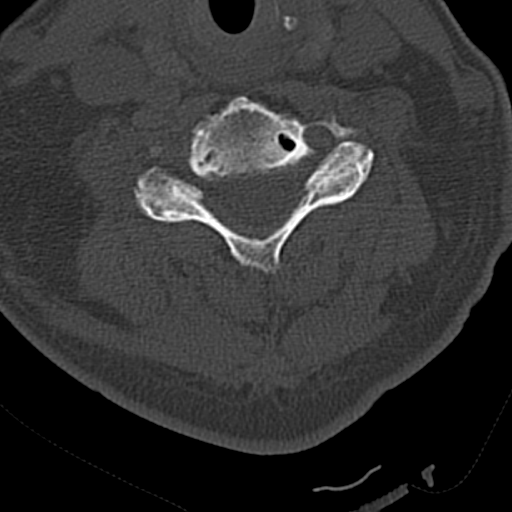
[im 65/97  bone]
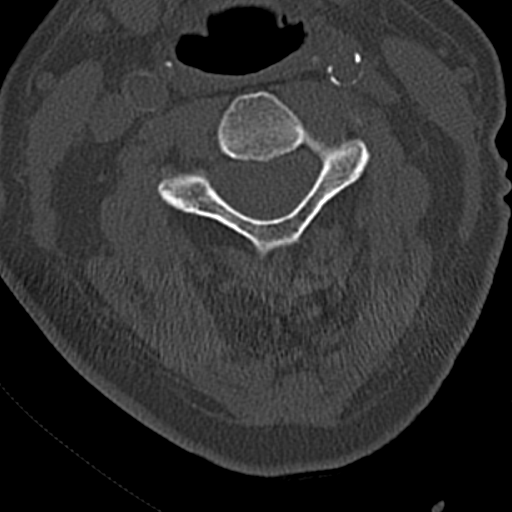
[im 81/97  soft-tissue]
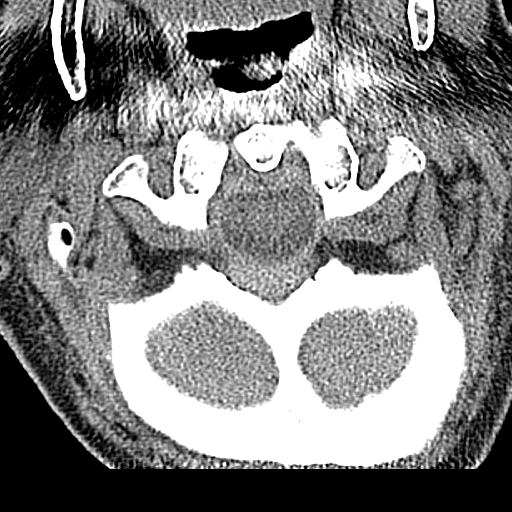
[im 81/97  bone]
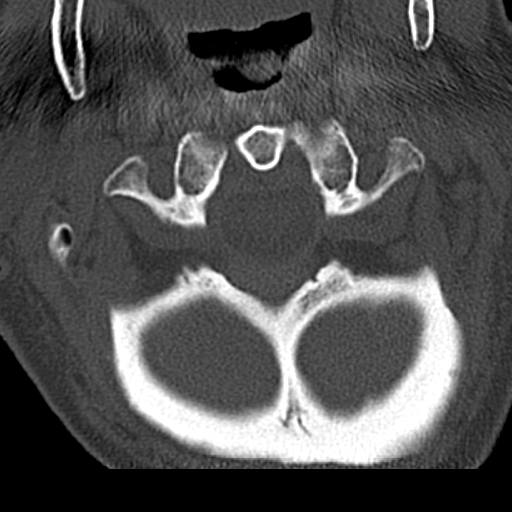

[13 of 33 positions shown; findings below may reference images not displayed]

FINDINGS: CT HEAD FINDINGS

There is no mass lesion, intraparenchymal hemorrhage or extra-axial
collection. No evidence of acute cortical infarct. No hydrocephalus.
There is periventricular hypoattenuation compatible with chronic
microvascular disease.

Soft tissue mass of the left nasopharynx measures approximately
x 3.1 cm with erosive changes of the adjacent petrous apex. Orbits
and globes are normal. No skull fracture is identified. Paranasal
sinuses are free of fluid. There is a left mastoid effusion, likely
secondary to obstruction of the left eustachian tube by the
above-described mass.

CT CERVICAL SPINE FINDINGS

There is no acute fracture or static subluxation. Cervical spine
alignment and facet articulations are normal. The occipital condyles
are aligned. The dens is intact.

There is a C4-C7 disc space narrowing and osteophyte formation. No
spinal canal stenosis. No advanced neural foraminal narrowing.

Visualized paraspinal soft tissues and lung apices are normal.
IMPRESSION: 1. No acute intracranial abnormality. Findings of chronic
microvascular disease.
2. Left nasopharyngeal mass with obstruction of the left eustachian
tube and erosive changes of the adjacent left petrous apex.
Unchanged size compared to prior studies. Though the imaging
appearance is most suggestive of nasopharyngeal carcinoma, the
lesion has been previously biopsied. Continued follow-up with
Otolaryngology is recommended.
3. No acute fracture or static subluxation of the cervical spine.

## 2019-01-07 ENCOUNTER — Inpatient Hospital Stay: Payer: Medicare Other | Admitting: Anesthesiology

## 2019-01-07 ENCOUNTER — Inpatient Hospital Stay: Payer: Medicare Other

## 2019-01-07 ENCOUNTER — Encounter: Payer: Self-pay | Admitting: Emergency Medicine

## 2019-01-07 ENCOUNTER — Encounter
Admission: EM | Disposition: A | Discharge status: Skilled Nursing Facility | Payer: Self-pay | Source: Home / Self Care | Attending: Internal Medicine

## 2019-01-07 ENCOUNTER — Emergency Department: Payer: Medicare Other

## 2019-01-07 ENCOUNTER — Other Ambulatory Visit: Payer: Self-pay

## 2019-01-07 ENCOUNTER — Inpatient Hospital Stay
Admission: EM | Admit: 2019-01-07 | Discharge: 2019-01-10 | DRG: 481 | Disposition: A | Discharge status: Skilled Nursing Facility | Payer: Medicare Other | Attending: Internal Medicine | Admitting: Internal Medicine

## 2019-01-07 DIAGNOSIS — E109 Type 1 diabetes mellitus without complications: Secondary | ICD-10-CM | POA: Diagnosis present

## 2019-01-07 DIAGNOSIS — Y9301 Activity, walking, marching and hiking: Secondary | ICD-10-CM | POA: Diagnosis present

## 2019-01-07 DIAGNOSIS — Z79899 Other long term (current) drug therapy: Secondary | ICD-10-CM

## 2019-01-07 DIAGNOSIS — D62 Acute posthemorrhagic anemia: Secondary | ICD-10-CM | POA: Diagnosis not present

## 2019-01-07 DIAGNOSIS — Z90710 Acquired absence of both cervix and uterus: Secondary | ICD-10-CM | POA: Diagnosis not present

## 2019-01-07 DIAGNOSIS — I10 Essential (primary) hypertension: Secondary | ICD-10-CM | POA: Diagnosis present

## 2019-01-07 DIAGNOSIS — Y92008 Other place in unspecified non-institutional (private) residence as the place of occurrence of the external cause: Secondary | ICD-10-CM

## 2019-01-07 DIAGNOSIS — Z9641 Presence of insulin pump (external) (internal): Secondary | ICD-10-CM | POA: Diagnosis present

## 2019-01-07 DIAGNOSIS — Z955 Presence of coronary angioplasty implant and graft: Secondary | ICD-10-CM | POA: Diagnosis not present

## 2019-01-07 DIAGNOSIS — S01111A Laceration without foreign body of right eyelid and periocular area, initial encounter: Secondary | ICD-10-CM | POA: Diagnosis present

## 2019-01-07 DIAGNOSIS — F329 Major depressive disorder, single episode, unspecified: Secondary | ICD-10-CM | POA: Diagnosis present

## 2019-01-07 DIAGNOSIS — Z87891 Personal history of nicotine dependence: Secondary | ICD-10-CM

## 2019-01-07 DIAGNOSIS — S72002A Fracture of unspecified part of neck of left femur, initial encounter for closed fracture: Secondary | ICD-10-CM | POA: Diagnosis present

## 2019-01-07 DIAGNOSIS — Z7982 Long term (current) use of aspirin: Secondary | ICD-10-CM

## 2019-01-07 DIAGNOSIS — Z85820 Personal history of malignant melanoma of skin: Secondary | ICD-10-CM

## 2019-01-07 DIAGNOSIS — Z794 Long term (current) use of insulin: Secondary | ICD-10-CM | POA: Diagnosis not present

## 2019-01-07 DIAGNOSIS — I252 Old myocardial infarction: Secondary | ICD-10-CM

## 2019-01-07 DIAGNOSIS — W010XXA Fall on same level from slipping, tripping and stumbling without subsequent striking against object, initial encounter: Secondary | ICD-10-CM | POA: Diagnosis present

## 2019-01-07 DIAGNOSIS — Z66 Do not resuscitate: Secondary | ICD-10-CM | POA: Diagnosis present

## 2019-01-07 DIAGNOSIS — Z20828 Contact with and (suspected) exposure to other viral communicable diseases: Secondary | ICD-10-CM | POA: Diagnosis present

## 2019-01-07 DIAGNOSIS — I2510 Atherosclerotic heart disease of native coronary artery without angina pectoris: Secondary | ICD-10-CM | POA: Diagnosis present

## 2019-01-07 DIAGNOSIS — S72011A Unspecified intracapsular fracture of right femur, initial encounter for closed fracture: Principal | ICD-10-CM | POA: Diagnosis present

## 2019-01-07 DIAGNOSIS — S72001A Fracture of unspecified part of neck of right femur, initial encounter for closed fracture: Secondary | ICD-10-CM

## 2019-01-07 DIAGNOSIS — Z7902 Long term (current) use of antithrombotics/antiplatelets: Secondary | ICD-10-CM | POA: Diagnosis not present

## 2019-01-07 DIAGNOSIS — M25551 Pain in right hip: Secondary | ICD-10-CM | POA: Diagnosis present

## 2019-01-07 HISTORY — PX: HIP PINNING,CANNULATED: SHX1758

## 2019-01-07 LAB — BASIC METABOLIC PANEL
Anion gap: 10 (ref 5–15)
BUN: 13 mg/dL (ref 8–23)
CO2: 27 mmol/L (ref 22–32)
Calcium: 9.4 mg/dL (ref 8.9–10.3)
Chloride: 101 mmol/L (ref 98–111)
Creatinine, Ser: 0.61 mg/dL (ref 0.44–1.00)
GFR calc Af Amer: >60 mL/min (ref >60)
GFR calc non Af Amer: >60 mL/min (ref >60)
Glucose, Bld: 212 mg/dL — ABNORMAL HIGH (ref 70–99)
Potassium: 4.1 mmol/L (ref 3.5–5.1)
Sodium: 138 mmol/L (ref 135–145)

## 2019-01-07 LAB — GLUCOSE, CAPILLARY
Glucose-Capillary: 306 mg/dL — ABNORMAL HIGH (ref 70–99)
Glucose-Capillary: 176 mg/dL — ABNORMAL HIGH (ref 70–99)
Glucose-Capillary: 178 mg/dL — ABNORMAL HIGH (ref 70–99)

## 2019-01-07 LAB — CBC WITH DIFFERENTIAL/PLATELET
Abs Immature Granulocytes: 0.04 10*3/uL (ref 0.00–0.07)
Basophils Absolute: 0 10*3/uL (ref 0.0–0.1)
Basophils Relative: 1 %
Eosinophils Absolute: 0.1 10*3/uL (ref 0.0–0.5)
Eosinophils Relative: 2 %
HCT: 39.8 % (ref 36.0–46.0)
Hemoglobin: 13.5 g/dL (ref 12.0–15.0)
Immature Granulocytes: 1 %
Lymphocytes Relative: 18 %
Lymphs Abs: 1 10*3/uL (ref 0.7–4.0)
MCH: 32 pg (ref 26.0–34.0)
MCHC: 33.9 g/dL (ref 30.0–36.0)
MCV: 94.3 fL (ref 80.0–100.0)
Monocytes Absolute: 0.3 10*3/uL (ref 0.1–1.0)
Monocytes Relative: 5 %
Neutro Abs: 4.2 10*3/uL (ref 1.7–7.7)
Neutrophils Relative %: 73 %
Platelets: 226 10*3/uL (ref 150–400)
RBC: 4.22 MIL/uL (ref 3.87–5.11)
RDW: 12.1 % (ref 11.5–15.5)
WBC: 5.7 10*3/uL (ref 4.0–10.5)
nRBC: 0 % (ref 0.0–0.2)

## 2019-01-07 LAB — PROTIME-INR
INR: 1 (ref 0.8–1.2)
Prothrombin Time: 12.8 seconds (ref 11.4–15.2)

## 2019-01-07 LAB — SARS CORONAVIRUS 2 BY RT PCR (HOSPITAL ORDER, PERFORMED IN ~~LOC~~ HOSPITAL LAB): SARS Coronavirus 2: NEGATIVE

## 2019-01-07 SURGERY — FIXATION, FEMUR, NECK, PERCUTANEOUS, USING SCREW
Anesthesia: Spinal | Site: Hip | Laterality: Right

## 2019-01-07 MED ORDER — LACTATED RINGERS IV SOLN
INTRAVENOUS | Status: DC | PRN
  Administered 2019-01-07: 17:00:00 via INTRAVENOUS

## 2019-01-07 MED ORDER — ENOXAPARIN SODIUM 40 MG/0.4ML ~~LOC~~ SOLN
40.0000 mg | SUBCUTANEOUS | Status: DC
  Administered 2019-01-08 – 2019-01-10 (×2): 40 mg via SUBCUTANEOUS
  Filled 2019-01-07 (×2): qty 0.4

## 2019-01-07 MED ORDER — ONDANSETRON HCL 4 MG/2ML IJ SOLN
4.0000 mg | Freq: Four times a day (QID) | INTRAMUSCULAR | Status: DC | PRN
  Administered 2019-01-07 – 2019-01-09 (×3): 4 mg via INTRAVENOUS
  Filled 2019-01-07 (×3): qty 2

## 2019-01-07 MED ORDER — DOCUSATE SODIUM 100 MG PO CAPS
100.0000 mg | ORAL_CAPSULE | Freq: Two times a day (BID) | ORAL | Status: DC
  Administered 2019-01-07 – 2019-01-10 (×6): 100 mg via ORAL
  Filled 2019-01-07 (×5): qty 1

## 2019-01-07 MED ORDER — KETOROLAC TROMETHAMINE 30 MG/ML IJ SOLN
INTRAMUSCULAR | Status: AC
  Filled 2019-01-07: qty 1

## 2019-01-07 MED ORDER — INSULIN ASPART 100 UNIT/ML ~~LOC~~ SOLN
0.0000 [IU] | SUBCUTANEOUS | Status: DC
  Administered 2019-01-07: 7 [IU] via SUBCUTANEOUS
  Administered 2019-01-07: 21:00:00 2 [IU] via SUBCUTANEOUS
  Administered 2019-01-08: 01:00:00 3 [IU] via SUBCUTANEOUS
  Administered 2019-01-08 (×2): 2 [IU] via SUBCUTANEOUS
  Filled 2019-01-07 (×5): qty 1

## 2019-01-07 MED ORDER — INSULIN GLARGINE 100 UNIT/ML ~~LOC~~ SOLN
12.0000 [IU] | Freq: Every day | SUBCUTANEOUS | Status: DC
  Filled 2019-01-07 (×2): qty 0.12

## 2019-01-07 MED ORDER — KETAMINE HCL 10 MG/ML IJ SOLN
INTRAMUSCULAR | Status: DC | PRN
  Administered 2019-01-07: 50 mg via INTRAVENOUS

## 2019-01-07 MED ORDER — ESMOLOL HCL 100 MG/10ML IV SOLN
INTRAVENOUS | Status: DC | PRN
  Administered 2019-01-07 (×7): 5 mg via INTRAVENOUS

## 2019-01-07 MED ORDER — METOCLOPRAMIDE HCL 10 MG PO TABS: 5.0000 mg | ORAL_TABLET | Freq: Three times a day (TID) | ORAL | Status: DC | PRN

## 2019-01-07 MED ORDER — SODIUM CHLORIDE 0.9 % IV SOLN
INTRAVENOUS | Status: DC | PRN
  Administered 2019-01-07: 17:00:00 20 ug/min via INTRAVENOUS

## 2019-01-07 MED ORDER — ACETAMINOPHEN 500 MG PO TABS
500.0000 mg | ORAL_TABLET | Freq: Four times a day (QID) | ORAL | Status: AC
  Administered 2019-01-07 – 2019-01-08 (×3): 500 mg via ORAL
  Filled 2019-01-07 (×3): qty 1

## 2019-01-07 MED ORDER — ASPIRIN EC 81 MG PO TBEC
81.0000 mg | DELAYED_RELEASE_TABLET | Freq: Every day | ORAL | Status: DC
  Administered 2019-01-08 – 2019-01-09 (×2): 81 mg via ORAL
  Filled 2019-01-07 (×2): qty 1

## 2019-01-07 MED ORDER — METOCLOPRAMIDE HCL 5 MG/ML IJ SOLN
5.0000 mg | Freq: Three times a day (TID) | INTRAMUSCULAR | Status: DC | PRN
  Administered 2019-01-09: 07:00:00 10 mg via INTRAVENOUS
  Filled 2019-01-07: qty 2

## 2019-01-07 MED ORDER — INSULIN PUMP
Freq: Three times a day (TID) | SUBCUTANEOUS | Status: DC
  Filled 2019-01-07: qty 1

## 2019-01-07 MED ORDER — ONDANSETRON HCL 4 MG/2ML IJ SOLN
INTRAMUSCULAR | Status: DC | PRN
  Administered 2019-01-07: 4 mg via INTRAVENOUS

## 2019-01-07 MED ORDER — MIDAZOLAM HCL 2 MG/2ML IJ SOLN
INTRAMUSCULAR | Status: AC
  Filled 2019-01-07: qty 2

## 2019-01-07 MED ORDER — FENTANYL CITRATE (PF) 100 MCG/2ML IJ SOLN: 25.0000 ug | INTRAMUSCULAR | Status: DC | PRN

## 2019-01-07 MED ORDER — MAGNESIUM HYDROXIDE 400 MG/5ML PO SUSP: 30.0000 mL | Freq: Every day | ORAL | Status: DC | PRN

## 2019-01-07 MED ORDER — SODIUM CHLORIDE 0.9 % IV SOLN
INTRAVENOUS | Status: DC
  Administered 2019-01-07: 50 mL/h via INTRAVENOUS

## 2019-01-07 MED ORDER — HYDROCODONE-ACETAMINOPHEN 5-325 MG PO TABS
1.0000 | ORAL_TABLET | ORAL | Status: DC | PRN
  Administered 2019-01-08 (×2): 2 via ORAL
  Administered 2019-01-10: 1 via ORAL
  Filled 2019-01-07 (×2): qty 2
  Filled 2019-01-07: qty 1

## 2019-01-07 MED ORDER — BUPIVACAINE-EPINEPHRINE (PF) 0.5% -1:200000 IJ SOLN
INTRAMUSCULAR | Status: AC
  Filled 2019-01-07: qty 30

## 2019-01-07 MED ORDER — TRAMADOL HCL 50 MG PO TABS
50.0000 mg | ORAL_TABLET | Freq: Four times a day (QID) | ORAL | Status: DC | PRN
  Administered 2019-01-07 – 2019-01-08 (×2): 50 mg via ORAL
  Filled 2019-01-07 (×2): qty 1

## 2019-01-07 MED ORDER — SODIUM CHLORIDE 0.9 % IV SOLN
INTRAVENOUS | Status: DC
  Administered 2019-01-07 – 2019-01-08 (×2): via INTRAVENOUS

## 2019-01-07 MED ORDER — ONDANSETRON HCL 4 MG PO TABS: 4.0000 mg | ORAL_TABLET | Freq: Four times a day (QID) | ORAL | Status: DC | PRN

## 2019-01-07 MED ORDER — ONDANSETRON HCL 4 MG/2ML IJ SOLN
4.0000 mg | Freq: Once | INTRAMUSCULAR | Status: AC
  Administered 2019-01-07: 4 mg via INTRAVENOUS
  Filled 2019-01-07: qty 2

## 2019-01-07 MED ORDER — PHENYLEPHRINE HCL (PRESSORS) 10 MG/ML IV SOLN
INTRAVENOUS | Status: DC | PRN
  Administered 2019-01-07: 80 ug via INTRAVENOUS

## 2019-01-07 MED ORDER — BUPIVACAINE-EPINEPHRINE (PF) 0.5% -1:200000 IJ SOLN
INTRAMUSCULAR | Status: DC | PRN
  Administered 2019-01-07: 10 mL

## 2019-01-07 MED ORDER — FLEET ENEMA 7-19 GM/118ML RE ENEM: 1.0000 | ENEMA | Freq: Once | RECTAL | Status: DC | PRN

## 2019-01-07 MED ORDER — TRAMADOL HCL 50 MG PO TABS: 50.0000 mg | ORAL_TABLET | Freq: Four times a day (QID) | ORAL | Status: DC | PRN

## 2019-01-07 MED ORDER — FENTANYL CITRATE (PF) 100 MCG/2ML IJ SOLN
INTRAMUSCULAR | Status: AC
  Filled 2019-01-07: qty 2

## 2019-01-07 MED ORDER — CEFAZOLIN SODIUM-DEXTROSE 2-4 GM/100ML-% IV SOLN
2.0000 g | Freq: Four times a day (QID) | INTRAVENOUS | Status: AC
  Administered 2019-01-08 (×3): 2 g via INTRAVENOUS
  Filled 2019-01-07 (×3): qty 100

## 2019-01-07 MED ORDER — ONDANSETRON HCL 4 MG/2ML IJ SOLN: 4.0000 mg | Freq: Four times a day (QID) | INTRAMUSCULAR | Status: DC | PRN

## 2019-01-07 MED ORDER — ACETAMINOPHEN 325 MG PO TABS: 650.0000 mg | ORAL_TABLET | Freq: Four times a day (QID) | ORAL | Status: DC | PRN

## 2019-01-07 MED ORDER — MORPHINE SULFATE (PF) 2 MG/ML IV SOLN: 2.0000 mg | INTRAVENOUS | Status: DC | PRN

## 2019-01-07 MED ORDER — LIDOCAINE HCL (PF) 1 % IJ SOLN
INTRAMUSCULAR | Status: AC
  Filled 2019-01-07: qty 5

## 2019-01-07 MED ORDER — PROPOFOL 10 MG/ML IV BOLUS
INTRAVENOUS | Status: AC
  Filled 2019-01-07: qty 20

## 2019-01-07 MED ORDER — PROPOFOL 500 MG/50ML IV EMUL
INTRAVENOUS | Status: DC | PRN
  Administered 2019-01-07: 75 ug/kg/min via INTRAVENOUS

## 2019-01-07 MED ORDER — POLYETHYLENE GLYCOL 3350 17 G PO PACK: 17.0000 g | PACK | Freq: Every day | ORAL | Status: DC | PRN

## 2019-01-07 MED ORDER — CEFAZOLIN SODIUM-DEXTROSE 2-4 GM/100ML-% IV SOLN
2.0000 g | Freq: Once | INTRAVENOUS | Status: AC
  Administered 2019-01-07: 2 g via INTRAVENOUS
  Filled 2019-01-07: qty 100

## 2019-01-07 MED ORDER — MORPHINE SULFATE (PF) 4 MG/ML IV SOLN
4.0000 mg | Freq: Once | INTRAVENOUS | Status: AC
  Administered 2019-01-07: 4 mg via INTRAVENOUS
  Filled 2019-01-07: qty 1

## 2019-01-07 MED ORDER — ROSUVASTATIN CALCIUM 10 MG PO TABS
10.0000 mg | ORAL_TABLET | Freq: Every day | ORAL | Status: DC
  Administered 2019-01-07 – 2019-01-09 (×3): 10 mg via ORAL
  Filled 2019-01-07 (×3): qty 1

## 2019-01-07 MED ORDER — FLUOXETINE HCL 20 MG PO CAPS
40.0000 mg | ORAL_CAPSULE | Freq: Every evening | ORAL | Status: DC
  Administered 2019-01-07 – 2019-01-09 (×3): 40 mg via ORAL
  Filled 2019-01-07 (×4): qty 2

## 2019-01-07 MED ORDER — KETAMINE HCL 50 MG/ML IJ SOLN
INTRAMUSCULAR | Status: AC
  Filled 2019-01-07: qty 10

## 2019-01-07 MED ORDER — DIPHENHYDRAMINE HCL 12.5 MG/5ML PO ELIX: 12.5000 mg | ORAL_SOLUTION | ORAL | Status: DC | PRN

## 2019-01-07 MED ORDER — BISACODYL 10 MG RE SUPP
10.0000 mg | Freq: Every day | RECTAL | Status: DC | PRN
  Administered 2019-01-10: 10 mg via RECTAL
  Filled 2019-01-07: qty 1

## 2019-01-07 MED ORDER — ACETAMINOPHEN 325 MG PO TABS: 325.0000 mg | ORAL_TABLET | Freq: Four times a day (QID) | ORAL | Status: DC | PRN

## 2019-01-07 MED ORDER — MIDAZOLAM HCL 2 MG/2ML IJ SOLN
INTRAMUSCULAR | Status: DC | PRN
  Administered 2019-01-07: 2 mg via INTRAVENOUS

## 2019-01-07 MED ORDER — ALBUTEROL SULFATE (2.5 MG/3ML) 0.083% IN NEBU: 2.5000 mg | INHALATION_SOLUTION | RESPIRATORY_TRACT | Status: DC | PRN

## 2019-01-07 MED ORDER — ACETAMINOPHEN 650 MG RE SUPP: 650.0000 mg | Freq: Four times a day (QID) | RECTAL | Status: DC | PRN

## 2019-01-07 MED ORDER — FENTANYL CITRATE (PF) 100 MCG/2ML IJ SOLN
50.0000 ug | Freq: Once | INTRAMUSCULAR | Status: AC
  Administered 2019-01-07: 50 ug via INTRAVENOUS
  Filled 2019-01-07: qty 2

## 2019-01-07 MED ORDER — EPHEDRINE SULFATE 50 MG/ML IJ SOLN
INTRAMUSCULAR | Status: DC | PRN
  Administered 2019-01-07: 5 mg via INTRAVENOUS

## 2019-01-07 MED ORDER — DOCUSATE SODIUM 100 MG PO CAPS
100.0000 mg | ORAL_CAPSULE | Freq: Two times a day (BID) | ORAL | Status: DC
  Filled 2019-01-07 (×3): qty 1

## 2019-01-07 SURGICAL SUPPLY — 35 items
BIT DRILL 4.8X300 (BIT) ×2 IMPLANT
BIT DRILL 4.8X300MM (BIT) ×1
BNDG COHESIVE 4X5 TAN STRL (GAUZE/BANDAGES/DRESSINGS) ×3 IMPLANT
BNDG COHESIVE 6X5 TAN STRL LF (GAUZE/BANDAGES/DRESSINGS) ×3 IMPLANT
CANISTER SUCT 1200ML W/VALVE (MISCELLANEOUS) ×3 IMPLANT
CHLORAPREP W/TINT 26 (MISCELLANEOUS) ×6 IMPLANT
COVER WAND RF STERILE (DRAPES) ×3 IMPLANT
DRSG OPSITE POSTOP 3X4 (GAUZE/BANDAGES/DRESSINGS) ×3 IMPLANT
DRSG OPSITE POSTOP 4X6 (GAUZE/BANDAGES/DRESSINGS) ×3 IMPLANT
ELECT REM PT RETURN 9FT ADLT (ELECTROSURGICAL) ×3
ELECTRODE REM PT RTRN 9FT ADLT (ELECTROSURGICAL) ×1 IMPLANT
GLOVE BIO SURGEON STRL SZ8 (GLOVE) ×12 IMPLANT
GLOVE INDICATOR 8.0 STRL GRN (GLOVE) ×3 IMPLANT
GOWN STRL REUS W/ TWL LRG LVL3 (GOWN DISPOSABLE) ×1 IMPLANT
GOWN STRL REUS W/ TWL XL LVL3 (GOWN DISPOSABLE) ×1 IMPLANT
GOWN STRL REUS W/TWL LRG LVL3 (GOWN DISPOSABLE) ×2
GOWN STRL REUS W/TWL XL LVL3 (GOWN DISPOSABLE) ×2
NEEDLE FILTER BLUNT 18X 1/2SAF (NEEDLE) ×2
NEEDLE FILTER BLUNT 18X1 1/2 (NEEDLE) ×1 IMPLANT
NEEDLE HYPO 22GX1.5 SAFETY (NEEDLE) ×3 IMPLANT
PACK HIP COMPR (MISCELLANEOUS) ×3 IMPLANT
PIN GUIDE DRILL TIP 2.8X300 (DRILL) ×3 IMPLANT
SCREW 16MM THREAD 6.5X75MM (Screw) ×3 IMPLANT
SCREW 16MM THREAD 6.5X85MM (Screw) ×6 IMPLANT
SCREW CANN THRD 6.5X80 (Screw) ×3 IMPLANT
STAPLER SKIN PROX 35W (STAPLE) ×3 IMPLANT
STRAP SAFETY 5IN WIDE (MISCELLANEOUS) ×3 IMPLANT
SUT PROLENE 2 0 FS (SUTURE) IMPLANT
SUT VIC AB 0 CT1 36 (SUTURE) ×3 IMPLANT
SUT VIC AB 0 SH 27 (SUTURE) IMPLANT
SUT VIC AB 2-0 CT1 27 (SUTURE) ×2
SUT VIC AB 2-0 CT1 TAPERPNT 27 (SUTURE) ×1 IMPLANT
SYR 20ML LL LF (SYRINGE) ×3 IMPLANT
SYR 5ML LL (SYRINGE) ×3 IMPLANT
TRAY FOLEY MTR SLVR 16FR STAT (SET/KITS/TRAYS/PACK) ×3 IMPLANT

## 2019-01-07 NOTE — Consult Note (Signed)
ORTHOPAEDIC CONSULTATION  REQUESTING PHYSICIAN: Hillary Bow, MD  Chief Complaint:   Right hip pain.  History of Present Illness: Diana Frost is a 73 y.o. female with a history of coronary artery disease, diabetes, hypertension, and a prior MI who lives independently with her husband was in her usual state of health this morning.  Apparently she stepped off the edge of a ramp leading into her house and pitched forward, striking her head against a ceramic pot and landing on her right hip.  She was helped into the house by 2 men who were walking by.  She then was brought to the emergency room by EMS where x-rays in the ER confirmed the presence of a valgus impacted right femoral neck fracture.  She also was noted to have a laceration over her right eyebrow which is being repaired by the ER provider.  The patient denies any actual loss of consciousness resulting from the injury, and denies any other associated injuries.  She denies any lightheadedness, dizziness, chest pain, shortness of breath, or other symptoms which may have precipitated her fall.  Past Medical History:  Diagnosis Date  . Coronary artery disease   . Diabetes mellitus without complication (HCC)    insulin pump  . Headache    since ear problem started  . Hypertension   . Myocardial infarction ([place]) 11/01/13   Past Surgical History:  Procedure Laterality Date  . ABDOMINAL HYSTERECTOMY    . BACK SURGERY     tail bone removed after fall/fracture  . CATARACT EXTRACTION BILATERAL W/ ANTERIOR VITRECTOMY    . CORONARY ANGIOPLASTY WITH STENT PLACEMENT  11/01/13   [facility], stent x2  . LEFT HEART CATH AND CORONARY ANGIOGRAPHY N/A 08/14/2016   Procedure: Left Heart Cath and Coronary Angiography;  Surgeon: Minna Merritts, MD;  Location: [facility];  Service: Cardiovascular;  Laterality: N/A;  . MELANOMA EXCISION Right    arm with skin graft  .  MYRINGOTOMY WITH TUBE PLACEMENT Left 08/21/2015   Procedure: MYRINGOTOMY WITH TUBE PLACEMENT;  Surgeon: Carloyn Manner, MD;  Location: [facility];  Service: ENT;  Laterality: Left;  . NASOPHARYNGEAL BIOPSY N/A 11/07/2015   Procedure: NASOPHARYNGEAL BIOPSY;  Surgeon: Carloyn Manner, MD;  Location: [facility];  Service: ENT;  Laterality: N/A;  . TONSILLECTOMY     Social History   Socioeconomic History  . Marital status: Married    Spouse name: Not on file  . Number of children: Not on file  . Years of education: Not on file  . Highest education level: Not on file  Occupational History  . Not on file  Social Needs  . Financial resource strain: Not on file  . Food insecurity    Worry: Not on file    Inability: Not on file  . Transportation needs    Medical: Not on file    Non-medical: Not on file  Tobacco Use  . Smoking status: Former Smoker    Types: Cigarettes    Quit date: 04/30/1998    Years since quitting: 20.7  . Smokeless tobacco: Never Used  Substance and Sexual Activity  . Alcohol use: Yes    Alcohol/week: 17.0 standard drinks    Types: 17 Glasses of wine per week    Comment:    . Drug use: No  . Sexual activity: Not on file  Lifestyle  . Physical activity    Days per week: Not on file    Minutes per session: Not on file  .  Stress: Not on file  Relationships  . Social Herbalist on phone: Not on file    Gets together: Not on file    Attends religious service: Not on file    Active member of club or organization: Not on file    Attends meetings of clubs or organizations: Not on file    Relationship status: Not on file  Other Topics Concern  . Not on file  Social History Narrative  . Not on file   Family History  Problem Relation Age of Onset  . Lung cancer Father   . Breast cancer Maternal Grandfather    Allergies  Allergen Reactions  . Percocet [Oxycodone-Acetaminophen] Nausea And Vomiting  . Statins     Joint pain  . Sulfa  Antibiotics Nausea And Vomiting   Prior to Admission medications   Medication Sig Start Date End Date Taking? Authorizing Provider  aspirin 81 MG tablet Take 81 mg by mouth daily.   Yes [provider]  FLUoxetine (PROZAC) 40 MG capsule Take 40 mg by mouth every evening.    Yes [provider]  insulin lispro (HUMALOG) 100 UNIT/ML injection Inject 0-23 Units into the skin daily. VIA PUMP 09/29/18  Yes [provider]  rosuvastatin (CRESTOR) 10 MG tablet Take 10 mg by mouth at bedtime. 12/05/18  Yes [provider]  clopidogrel (PLAVIX) 75 MG tablet Take 1 tablet (75 mg total) by mouth daily. Patient not taking: Reported on 01/07/2019 08/15/16   Hillary Bow, MD  Glucagon, rDNA, (GLUCAGON EMERGENCY IJ) Inject as directed as needed (for low blood sugar).    [provider]  insulin glargine (LANTUS) 100 UNIT/ML injection Inject 13 Units into the skin daily as needed (If pump does not lower blood sugars). As needed if insulin pump is not working     [provider]  ondansetron (ZOFRAN-ODT) 4 MG disintegrating tablet Take 4 mg by mouth every 8 (eight) hours as needed for nausea or vomiting.    [provider]  pantoprazole (PROTONIX) 40 MG tablet Take 1 tablet (40 mg total) by mouth daily. Patient not taking: Reported on 01/07/2019 09/01/16   Wellington Hampshire, MD   Ct Head Wo Contrast  Result Date: 01/07/2019 CLINICAL DATA:  Acute pain due to fall. Laceration above right eye. EXAM: CT HEAD WITHOUT CONTRAST CT CERVICAL SPINE WITHOUT CONTRAST TECHNIQUE: Multidetector CT imaging of the head and cervical spine was performed following the standard protocol without intravenous contrast. Multiplanar CT image reconstructions of the cervical spine were also generated. COMPARISON:  CT dated November 29, 2015 could not be retrieved for comparison. FINDINGS: CT HEAD FINDINGS Brain: No evidence of acute infarction, hemorrhage, hydrocephalus, extra-axial  collection or mass lesion/mass effect. Age related atrophy and chronic microvascular ischemic changes are noted. There is a lacunar infarct within the right basal ganglia. Vascular: No hyperdense vessel or unexpected calcification. Skull: Normal. Negative for fracture or focal lesion. There is right frontal soft tissue swelling without evidence for an underlying fracture. Sinuses/Orbits: There is mucosal thickening of the left maxillary sinus and bilateral sphenoid sinuses. The remaining paranasal sinuses and mastoid air cells are essentially clear. There is some mild opacification of the left mastoid air cells which is similar to prior study. The previously noted nasopharyngeal mass is better visualized on prior imaging. Other: None. CT CERVICAL SPINE FINDINGS Alignment: Normal. Skull base and vertebrae: No acute fracture. No primary bone lesion or focal pathologic process. Soft tissues and spinal canal: No  prevertebral fluid or swelling. No visible canal hematoma. Disc levels: There are mild multilevel degenerative changes throughout the cervical spine, greatest at the C5-C6 and C6-C7 levels. Upper chest: Negative. Other: None IMPRESSION: 1. No evidence for acute intracranial process. 2. Right frontal soft tissue swelling without evidence for an underlying fracture. 3. No evidence for acute fracture or subluxation of the cervical spine. 4. Mild multilevel degenerative changes of the cervical spine. 5. 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 There is persistent unchanged opacification of the left mastoid air cells. Electronically Signed   By: Constance Holster M.D.   On: 01/07/2019 10:45   Ct Cervical Spine Wo Contrast  Result Date: 01/07/2019 CLINICAL DATA:  Acute pain due to fall. Laceration above right eye. EXAM: CT HEAD WITHOUT CONTRAST CT CERVICAL SPINE WITHOUT CONTRAST TECHNIQUE: Multidetector CT imaging of the head and cervical spine was  performed following the standard protocol without intravenous contrast. Multiplanar CT image reconstructions of the cervical spine were also generated. COMPARISON:  CT dated November 29, 2015 could not be retrieved for comparison. FINDINGS: CT HEAD FINDINGS Brain: No evidence of acute infarction, hemorrhage, hydrocephalus, extra-axial collection or mass lesion/mass effect. Age related atrophy and chronic microvascular ischemic changes are noted. There is a lacunar infarct within the right basal ganglia. Vascular: No hyperdense vessel or unexpected calcification. Skull: Normal. Negative for fracture or focal lesion. There is right frontal soft tissue swelling without evidence for an underlying fracture. Sinuses/Orbits: There is mucosal thickening of the left maxillary sinus and bilateral sphenoid sinuses. The remaining paranasal sinuses and mastoid air cells are essentially clear. There is some mild opacification of the left mastoid air cells which is similar to prior study. The previously noted nasopharyngeal mass is better visualized on prior imaging. Other: None. CT CERVICAL SPINE FINDINGS Alignment: Normal. Skull base and vertebrae: No acute fracture. No primary bone lesion or focal pathologic process. Soft tissues and spinal canal: No prevertebral fluid or swelling. No visible canal hematoma. Disc levels: There are mild multilevel degenerative changes throughout the cervical spine, greatest at the C5-C6 and C6-C7 levels. Upper chest: Negative. Other: None IMPRESSION: 1. No evidence for acute intracranial process. 2. Right frontal soft tissue swelling without evidence for an underlying fracture. 3. No evidence for acute fracture or subluxation of the cervical spine. 4. Mild multilevel degenerative changes of the cervical spine. 5. 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 There is persistent unchanged opacification of the left mastoid air cells.  Electronically Signed   By: Constance Holster M.D.   On: 01/07/2019 10:45   Dg Hip Unilat W Or Wo Pelvis 2-3 Views Right  Result Date: 01/07/2019 CLINICAL DATA:  Status post fall. EXAM: DG HIP (WITH OR WITHOUT PELVIS) 2-3V RIGHT COMPARISON:  None. FINDINGS: Generalized osteopenia. Mildly impacted right subcapital femoral neck fracture. No other acute fracture or dislocation. No aggressive osseous lesion. Peripheral vascular atherosclerotic disease. IMPRESSION: 1. Mildly impacted right subcapital femoral neck fracture. Electronically Signed   By: Kathreen Devoid   On: 01/07/2019 10:57    Positive ROS: All other systems have been reviewed and were otherwise negative with the exception of those mentioned in the HPI and as above.  Physical Exam: General:  Alert, no acute distress Psychiatric:  Patient is competent for consent with normal mood and affect   Cardiovascular:  No pedal edema Respiratory:  No wheezing, non-labored breathing GI:  Abdomen is soft and non-tender  Skin:  No lesions in the area of chief complaint Neurologic:  Sensation intact distally Lymphatic:  No axillary or cervical lymphadenopathy  Orthopedic Exam:  Orthopedic examination is limited to the right hip and lower extremity.  The right lower extremity appears essentially symmetric to the left lower extremity in terms of length and alignment.  Skin inspection of the right hip is unremarkable.  No swelling, erythema, ecchymosis, abrasions, or other skin abnormalities are identified.  She has minimal tenderness to palpation of the lateral aspect of the hip.  She has more moderate pain with attempted active or passive motion of the hip.  She is able dorsiflex plantarflex her toes and ankle.  Sensation is intact light touch to all distributions.  She has good capillary refill to her right foot.  X-rays:  X-rays of the pelvis and right hip are available for review and have been reviewed by myself.  These films demonstrate an  essentially nondisplaced valgus impacted right femoral neck fracture.  No significant degenerative changes of the hip joint are noted.  No lytic lesions or other acute bony abnormalities are identified.  Assessment: Essentially nondisplaced impacted right femoral neck fracture.  Plan: The treatment options have been discussed with the patient and her daughter, including both surgical and nonsurgical choices.  The patient would like to proceed with surgical intervention to include an in situ cannulated screw fixation of the impacted right femoral neck fracture.  This procedure has been discussed in detail with the patient, as have the potential risks (including bleeding, infection, nerve and/or blood vessel injury, persistent or recurrent pain, malunion and/or nonunion, development of arthritis, need for further surgery, blood clots, strokes, heart attacks and/or arrhythmias, etc.) and benefits.  The patient states her understanding and wishes to proceed.  A formal written consent has been obtained.  Thank you for asking me to participate in the care of this pleasant yet unfortunate woman.  I will be happy to follow her with you.   Pascal Lux, MD  Beeper #:  [phone]  01/07/2019 1:10 PM

## 2019-01-07 NOTE — ED Notes (Signed)
Patient sleeping and sats dropped to 88% on RA, placed on 2Ls [place]; MD Siadecki made aware.

## 2019-01-07 NOTE — Op Note (Signed)
01/07/2019  6:35 PM  Patient:   Diana Frost  Pre-Op Diagnosis:   Valgus-impacted right femoral neck fracture.  Post-Op Diagnosis:   Same.  Procedure:   In situ cannulated screw fixation of valgus-impacted right femoral neck fracture.  Surgeon:   Pascal Lux, MD  Assistant:   None  Anesthesia:   Spinal  Findings:   As above.  Complications:   None  EBL:   25 cc  Fluids:   800 cc crystalloid  UOP:   350 cc  TT:   None  Drains:   None  Closure:   Staples  Implants:   Biomet 6.5 mm cannulated screws (16 mm) 3  Brief Clinical Note:   The patient is a 73 year old female who sustained the above-noted injury earlier this morning when she apparently accidentally stepped off the side of a ramp leading into her house and landed on her right hip. The patient presented to the emergency room where x-rays demonstrated the above-noted findings. The patient has been cleared medically and presents at this time for definitive management of this injury.  Procedure:   The patient was brought into the operating room. After adequate spinal anesthesia was obtained, the patient was lain in the supine position on the fracture table. The uninjured leg was placed in a flexed and abducted position over the well-leg holder while the operative leg was placed in gentle longitudinal traction with some internal rotation. The adequacy of fracture position was verified fluoroscopically in AP and lateral projections before the lateral aspect of the right hip and thigh were prepped with ChloraPrep solution and draped sterilely. Preoperative antibiotics were administered.   An approximately 3-4 cm incision was made over the lateral aspect of the lower part of the greater trochanter as verified fluoroscopically. This incision was carried down through the subcutaneous cutaneous tissues to expose the iliotibial band. This was split the length the incision to expose the lateral aspect of the proximal femur  at the level of the inferior most part of the greater trochanter. Under fluoroscopic guidance, a guide wire was drilled up through the femoral neck along the calcar into the femoral head to rest within 5 mm of subchondral bone. Its position was assessed fluoroscopically in AP and lateral projections and found to be excellent. Two additional guide wires were placed in parallel fashion more proximally, anterior and posterior to the original pin in an inverted triangular configuration. Again the position of these pins was verified fluoroscopically in AP, lateral, and oblique projections and found to be excellent. The length of each of these pins was measured before each pin was overdrilled with the appropriate cannulated drill. Each of these screws was inserted and advanced to within 8-10 mm of subchondral bone. Again the position of each of these screws was assessed fluoroscopically in AP, lateral, and oblique projections, and found to be excellent.  The wound was copiously irrigated with sterile saline solution before the IT band was reapproximated using #0 Vicryl interrupted sutures. The subcutaneous tissues also were closed using 2-0 Vicryl interrupted sutures before the skin was closed using staples. A total of 10 cc of 0.5% Sensorcaine with epinephrine was injected in and around the surgical incision before a sterile occlusive dressing was applied to the wound. The patient was then awakened, transferred back to his hospital bed, and returned to the recovery room in satisfactory condition after tolerating the procedure well.

## 2019-01-07 NOTE — ED Notes (Signed)
Pt taken to OR.

## 2019-01-07 NOTE — ED Provider Notes (Signed)
[facility] Emergency Department Provider Note ____________________________________________   First MD Initiated Contact with Patient 01/07/19 [phone]     (approximate)  I have reviewed the triage vital signs and the nursing notes.   HISTORY  Chief Complaint Fall    HPI Diana Frost is a 73 y.o. female with PMH as noted below who presents with a head injury and right hip pain after a mechanical fall from standing height.  The patient states that she tripped over a ramp in her home causing her to fall.  She sustained a laceration to her right forehead but did not lose consciousness.  She denies any neck or back pain but states that she has pain in the right hip and has not been able to walk on it or bear weight.  She denies any other injuries.  Past Medical History:  Diagnosis Date  . Coronary artery disease   . Diabetes mellitus without complication (HCC)    insulin pump  . Headache    since ear problem started  . Hypertension   . Myocardial infarction [facility]) 11/01/13    Patient Active Problem List   Diagnosis Date Noted  . Closed right hip fracture, initial encounter ([place]) 01/07/2019  . Malnutrition of moderate degree 08/14/2016  . Non-ST elevation (NSTEMI) myocardial infarction ([place]) 08/13/2016  . 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 AKI (acute kidney injury) ([place]) 08/13/2016  . Hypokalemia 08/13/2016  . DKA (diabetic ketoacidoses) ([place]) 08/12/2016  . Melanoma ([place]) 11/01/2015  . Stable angina ([place]) 10/10/2015  . Coronary artery disease involving native coronary artery of native heart 11/16/2012  . History of insertion of insulin pump 05/24/2012  . Type 1 diabetes mellitus ([place]) 02/11/2011  . Depression 02/11/2011    Past Surgical History:  Procedure Laterality Date  . ABDOMINAL HYSTERECTOMY    . BACK SURGERY     tail bone removed after fall/fracture  . CATARACT EXTRACTION BILATERAL W/ ANTERIOR VITRECTOMY    . CORONARY ANGIOPLASTY WITH  STENT PLACEMENT  11/01/13   [facility], stent x2  . LEFT HEART CATH AND CORONARY ANGIOGRAPHY N/A 08/14/2016   Procedure: Left Heart Cath and Coronary Angiography;  Surgeon: Minna Merritts, MD;  Location: [place] [facility];  Service: Cardiovascular;  Laterality: N/A;  . MELANOMA EXCISION Right    arm with skin graft  . MYRINGOTOMY WITH TUBE PLACEMENT Left 08/21/2015   Procedure: MYRINGOTOMY WITH TUBE PLACEMENT;  Surgeon: Carloyn Manner, MD;  Location: [place];  Service: ENT;  Laterality: Left;  . NASOPHARYNGEAL BIOPSY N/A 11/07/2015   Procedure: NASOPHARYNGEAL BIOPSY;  Surgeon: Carloyn Manner, MD;  Location: [facility] ORS;  Service: ENT;  Laterality: N/A;  . TONSILLECTOMY      Prior to Admission medications   Medication Sig Start Date End Date Taking? Authorizing Provider  aspirin 81 MG tablet Take 81 mg by mouth daily.   Yes [provider]  FLUoxetine (PROZAC) 40 MG capsule Take 40 mg by mouth every evening.    Yes [provider]  insulin lispro (HUMALOG) 100 UNIT/ML injection Inject 0-23 Units into the skin daily. VIA PUMP 09/29/18  Yes [provider]  rosuvastatin (CRESTOR) 10 MG tablet Take 10 mg by mouth at bedtime. 12/05/18  Yes [provider]  clopidogrel (PLAVIX) 75 MG tablet Take 1 tablet (75 mg total) by mouth daily. Patient not taking: Reported on 01/07/2019 08/15/16   Hillary Bow, MD  Glucagon, rDNA, (GLUCAGON EMERGENCY IJ) Inject as directed as needed (for low blood  sugar).    [provider]  insulin glargine (LANTUS) 100 UNIT/ML injection Inject 13 Units into the skin daily as needed (If pump does not lower blood sugars). As needed if insulin pump is not working     [provider]  ondansetron (ZOFRAN-ODT) 4 MG disintegrating tablet Take 4 mg by mouth every 8 (eight) hours as needed for nausea or vomiting.    [provider]  pantoprazole (PROTONIX) 40 MG tablet Take 1 tablet (40 mg total) by mouth  daily. Patient not taking: Reported on 01/07/2019 09/01/16   Wellington Hampshire, MD    Allergies Percocet [oxycodone-acetaminophen], Statins, and Sulfa antibiotics  Family History  Problem Relation Age of Onset  . Lung cancer Father   . Breast cancer Maternal Grandfather     Social History Social History   Tobacco Use  . Smoking status: Former Smoker    Types: Cigarettes    Quit date: 04/30/1998    Years since quitting: 20.7  . Smokeless tobacco: Never Used  Substance Use Topics  . Alcohol use: Yes    Alcohol/week: 17.0 standard drinks    Types: 17 Glasses of wine per week    Comment:    . Drug use: No    Review of Systems  Constitutional: No fever. Eyes: No redness. ENT: No neck pain. Cardiovascular: Denies chest pain. Respiratory: Denies shortness of breath. Gastrointestinal: No vomiting or diarrhea.  Genitourinary: Negative for flank pain.  Musculoskeletal: Negative for back pain.  Positive for right hip pain. Skin: Negative for rash. Neurological: Negative for headaches, focal weakness or numbness.   ____________________________________________   PHYSICAL EXAM:  VITAL SIGNS: ED Triage Vitals  Enc Vitals Group     BP 01/07/19 0921 (!) 168/92     Pulse Rate 01/07/19 0921 82     Resp 01/07/19 0921 11     Temp 01/07/19 0921 98 F (36.7 C)     Temp Source 01/07/19 0921 Oral     SpO2 01/07/19 0918 99 %     Weight 01/07/19 0922 113 lb 15.7 oz (51.7 kg)     Height 01/07/19 0922 5\' 2"  (1.575 m)     Head Circumference --      Peak Flow --      Pain Score 01/07/19 0921 10     Pain Loc --      Pain Edu? --      Excl. in [place]? --     Constitutional: Alert and oriented.  Uncomfortable appearing but in no acute distress. Eyes: Conjunctivae are normal.  Head: 2 cm superficial laceration to right eyebrow area. Nose: No congestion/rhinnorhea. Mouth/Throat: Mucous membranes are moist.   Neck: Normal range of motion.  No midline cervical spinal tenderness.  Cardiovascular: Normal rate, regular rhythm. Good peripheral circulation. Respiratory: Normal respiratory effort.  No retractions. Gastrointestinal: Soft and nontender. No distention.  Genitourinary: No flank tenderness. Musculoskeletal: No lower extremity edema.  Extremities warm and well perfused.  Pain on any range of motion of right hip.  No tenderness at the knee or distal leg.  No midline spinal tenderness, step-off, or crepitus. Neurologic:  Normal speech and language.  Motor and sensory intact in all extremities.  No gross focal neurologic deficits are appreciated.  Skin:  Skin is warm and dry. No rash noted. Psychiatric: Mood and affect are normal. Speech and behavior are normal.  ____________________________________________   LABS (all labs ordered are listed, but only abnormal results are displayed)  Labs Reviewed  BASIC METABOLIC  PANEL - Abnormal; Notable for the following components:      Result Value   Glucose, Bld 212 (*)    All other components within normal limits  GLUCOSE, CAPILLARY - Abnormal; Notable for the following components:   Glucose-Capillary 306 (*)    All other components within normal limits  SARS CORONAVIRUS 2 (HOSPITAL ORDER, [place] [facility])  CBC WITH DIFFERENTIAL/PLATELET  PROTIME-INR  HEMOGLOBIN A1C   ____________________________________________  EKG   ____________________________________________  RADIOLOGY  CT head: No ICH CT cervical spine: No acute fracture XR R hip: Intertrochanteric fracture  ____________________________________________   PROCEDURES  Procedure(s) performed: Yes  .Marland KitchenLaceration Repair  Date/Time: 01/07/2019 3:43 PM Performed by: Arta Silence, MD Authorized by: Arta Silence, MD   Consent:    Consent obtained:  Verbal   Consent given by:  Patient   Risks discussed:  Infection, pain, retained foreign body, poor cosmetic result and poor wound healing Anesthesia (see MAR for  exact dosages):    Anesthesia method:  Local infiltration   Local anesthetic:  Lidocaine 1% w/o epi Laceration details:    Location:  Face   Face location:  R eyebrow   Length (cm):  2 Repair type:    Repair type:  Simple Exploration:    Hemostasis achieved with:  Direct pressure   Wound exploration: entire depth of wound probed and visualized     Contaminated: no   Treatment:    Amount of cleaning:  Standard   Visualized foreign bodies/material removed: no   Skin repair:    Repair method:  Sutures   Suture size:  5-0   Suture material:  Nylon   Suture technique:  Simple interrupted   Number of sutures:  3 Approximation:    Approximation:  Close Post-procedure details:    Dressing:  Sterile dressing   Patient tolerance of procedure:  Tolerated well, no immediate complications    Critical Care performed: No ____________________________________________   INITIAL IMPRESSION / ASSESSMENT AND PLAN / ED COURSE  Pertinent labs & imaging results that were available during my care of the patient were reviewed by me and considered in my medical decision making (see chart for details).  74 year old female with PMH as noted above (currently on aspirin but no other anticoagulation) presents with head and right hip injuries after a mechanical fall from standing height.  The patient had no LOC.  She has not been able to bear weight on the right leg since the fall.  On exam the patient is uncomfortable but not acutely ill-appearing.  Her vital signs are normal except for hypertension.  She has pain on any range of motion of the right hip but no gross deformity.  She has a 2 cm superficial laceration to the right eyebrow area but no other external trauma.  She has no facial bony tenderness.  There is no midline spinal tenderness.  Based on the patient's age and aspirin use as well as the distracting injury, I will obtain CT head and cervical spine.  We will also obtain x-rays of the right  hip.  I have a significant concern for hip fracture but the right lower extremity is neuro/vascular intact.  ----------------------------------------- 3:42 PM on 01/07/2019 -----------------------------------------  X-ray confirms subcapital femoral neck fracture.  CT head and C-spine are negative.  I consulted Dr. Roland Rack from orthopedics who came to evaluate the patient and recommends admission for surgery.  I signed her out to the hospitalist Dr. Darvin Neighbours for admission.   _____________________________  Diana Frost was evaluated in Emergency Department on 01/07/2019 for the symptoms described in the history of present illness. She was evaluated in the context of the global COVID-19 pandemic, which necessitated consideration that the patient might be at risk for infection with the SARS-CoV-2 virus that causes COVID-19. Institutional protocols and algorithms that pertain to the evaluation of patients at risk for COVID-19 are in a state of rapid change based on information released by regulatory bodies including the CDC and federal and state organizations. These policies and algorithms were followed during the patient's care in the ED.  ____________________________________________   FINAL CLINICAL IMPRESSION(S) / ED DIAGNOSES  Final diagnoses:  Closed fracture of neck of right femur, initial encounter ([place])  Laceration of right eyebrow, initial encounter      NEW MEDICATIONS STARTED DURING THIS VISIT:  New Prescriptions   No medications on file     Note:  This document was prepared using Dragon voice recognition software and may include unintentional dictation errors.    Arta Silence, MD 01/07/19 1544

## 2019-01-07 NOTE — Progress Notes (Signed)
MEWS Guidelines -  Not an acute change for patient, pt asymptomatic. MD Jannifer Franklin notified. Pt monitored per MEWS protocol.   Yellow - At risk for Deterioration  1. Go to room and assess patient 2. Validate data. Is this patient's baseline? If data confirmed: 3. Is this an acute change? 4. Administer prn meds/treatments as ordered? 5. Note Sepsis score 6. Review goals of care 7. Sports coach and Provider 8. Call RRT nurse as needed. 9. Document patient condition/interventions/response. 10. Increase frequency of vital signs and focused assessments to at least q2h x2. - If stable, then q4h x2 and then q8h or dept. routine. - If unstable, contact Provider & RRT nurse. Prepare for possible transfer. 11. Add entry in progress notes using the smart phrase ".MEWS".

## 2019-01-07 NOTE — Transfer of Care (Signed)
Immediate Anesthesia Transfer of Care Note  Patient: VELORA HORSTMAN  Procedure(s) Performed: CANNULATED HIP PINNING (Right Hip)  Patient Location: PACU  Anesthesia Type:MAC and Spinal  Level of Consciousness: awake, alert  and patient cooperative  Airway & Oxygen Therapy: Patient Spontanous Breathing and Patient connected to nasal cannula oxygen  Post-op Assessment: Report given to RN and Post -op Vital signs reviewed and stable  Post vital signs: Reviewed and stable  Last Vitals:  Vitals Value Taken Time  BP 118/68 01/07/19 1834  Temp 36.8 C 01/07/19 1833  Pulse 110 01/07/19 1842  Resp 14 01/07/19 1842  SpO2 99 % 01/07/19 1842  Vitals shown include unvalidated device data.  Last Pain:  Vitals:   01/07/19 1833  TempSrc:   PainSc: 0-No pain         Complications: No apparent anesthesia complications

## 2019-01-07 NOTE — Anesthesia Post-op Follow-up Note (Signed)
Anesthesia QCDR form completed.        

## 2019-01-07 NOTE — Anesthesia Preprocedure Evaluation (Addendum)
Anesthesia Evaluation  Patient identified by MRN, date of birth, ID band Patient awake    Reviewed: Allergy & Precautions, H&P , NPO status , Patient's Chart, lab work & pertinent test results  History of Anesthesia Complications Negative for: history of anesthetic complications  Airway Mallampati: III  TM Distance: <3 FB Neck ROM: limited    Dental  (+) Chipped   Pulmonary neg pulmonary ROS, neg shortness of breath, former smoker,           Cardiovascular Exercise Tolerance: Good hypertension, (-) angina+ CAD, + Past MI and + Cardiac Stents  (-) DOE      Neuro/Psych  Headaches, PSYCHIATRIC DISORDERS    GI/Hepatic negative GI ROS, Neg liver ROS, neg GERD  ,  Endo/Other  diabetes, Type 2, Insulin Dependent  Renal/GU Renal disease     Musculoskeletal   Abdominal   Peds  Hematology negative hematology ROS (+)   Anesthesia Other Findings Past Medical History: No date: Coronary artery disease No date: Diabetes mellitus without complication (HCC)     Comment:  insulin pump No date: Headache     Comment:  since ear problem started No date: Hypertension 11/01/13: Myocardial infarction [facility])  Past Surgical History: No date: ABDOMINAL HYSTERECTOMY No date: BACK SURGERY     Comment:  tail bone removed after fall/fracture No date: CATARACT EXTRACTION BILATERAL W/ ANTERIOR VITRECTOMY 11/01/13: CORONARY ANGIOPLASTY WITH STENT PLACEMENT     Comment:  [facility], stent x2 08/14/2016: LEFT HEART CATH AND CORONARY ANGIOGRAPHY; N/A     Comment:  Procedure: Left Heart Cath and Coronary Angiography;                Surgeon: Minna Merritts, MD;  Location: [place]               CV LAB;  Service: Cardiovascular;  Laterality: N/A; No date: MELANOMA EXCISION; Right     Comment:  arm with skin graft 08/21/2015: MYRINGOTOMY WITH TUBE PLACEMENT; Left     Comment:  Procedure: MYRINGOTOMY WITH TUBE PLACEMENT;  Surgeon:          Carloyn Manner, MD;  Location: [place];                Service: ENT;  Laterality: Left; 11/07/2015: NASOPHARYNGEAL BIOPSY; N/A     Comment:  Procedure: NASOPHARYNGEAL BIOPSY;  Surgeon: Carloyn Manner, MD;  Location: [facility] ORS;  Service: ENT;                Laterality: N/A; No date: TONSILLECTOMY  BMI    Body Mass Index: 20.85 kg/m      Reproductive/Obstetrics negative OB ROS                             Anesthesia Physical Anesthesia Plan  ASA: III  Anesthesia Plan: Spinal   Post-op Pain Management:    Induction:   PONV Risk Score and Plan:   Airway Management Planned: Natural Airway and Nasal Cannula  Additional Equipment:   Intra-op Plan:   Post-operative Plan:   Informed Consent: I have reviewed the patients History and Physical, chart, labs and discussed the procedure including the risks, benefits and alternatives for the proposed anesthesia with the patient or authorized representative who has indicated his/her understanding and acceptance.     Dental Advisory Given  Plan Discussed with: Anesthesiologist, CRNA and Surgeon  Anesthesia Plan Comments: (Patient reports no bleeding problems and no anticoagulant use (stopped Plavix more than one month ago).  Plan for spinal with backup GA  Patient consented for risks of anesthesia including but not limited to:  - adverse reactions to medications - risk of bleeding, infection, nerve damage and headache - risk of failed spinal - damage to teeth, lips or other oral mucosa - sore throat or hoarseness - Damage to heart, brain, lungs or loss of life  Patient voiced understanding.)       Anesthesia Quick Evaluation

## 2019-01-07 NOTE — Progress Notes (Signed)
Advance care planning  Purpose of Encounter Right hip fracture  Parties in Attendance Patient, daughter at bedside  Patients Decisional capacity Alert and oriented.  Able to make medical decisions.  Her healthcare power of attorney's are her husband and daughter.  She mentions husband has had back problems and is homebound at this time.  Wants daughter to make decisions.  Discussed in detail regarding right hip fracture.  Treatment plan , prognosis discussed.  All questions answered.  CODE STATUS discussed.  Patient wishes to be DNR/DNI.  Orders entered and CODE STATUS changed  DNR/DNI  Time spent - 17 minutes

## 2019-01-07 NOTE — H&P (Signed)
[facility] [place] NAME: Diana Frost    MR#:  [id number]  DATE OF BIRTH:  11/09/1945  DATE OF ADMISSION:  01/07/2019  PRIMARY CARE PHYSICIAN: Valera Castle, MD   REQUESTING/REFERRING PHYSICIAN: Dr. Cherylann Banas  CHIEF COMPLAINT:   Chief Complaint  Patient presents with  . Fall    HISTORY OF PRESENT ILLNESS:  Diana Frost  is a 73 y.o. female with a known history of CAD, diabetes mellitus on insulin pump presents to the emergency room after having a mechanical fall.  Patient was walking out to pick the newspaper and tripped on a ramp.  Landing on the right hip.  She does have bruising and laceration above her right eye.  CT scan of the head and neck have shown nothing acute.  She does have on x-ray impacted right subcapital femoral neck fracture. Patient removed her insulin pump here in the emergency room and has it with her at this point.  Pain is improved with pain medication.  No change in vision.  PAST MEDICAL HISTORY:   Past Medical History:  Diagnosis Date  . Coronary artery disease   . Diabetes mellitus without complication (HCC)    insulin pump  . Headache    since ear problem started  . Hypertension   . Myocardial infarction ([place]) 11/01/13    PAST SURGICAL HISTORY:   Past Surgical History:  Procedure Laterality Date  . ABDOMINAL HYSTERECTOMY    . BACK SURGERY     tail bone removed after fall/fracture  . CATARACT EXTRACTION BILATERAL W/ ANTERIOR VITRECTOMY    . CORONARY ANGIOPLASTY WITH STENT PLACEMENT  11/01/13   [facility], stent x2  . LEFT HEART CATH AND CORONARY ANGIOGRAPHY N/A 08/14/2016   Procedure: Left Heart Cath and Coronary Angiography;  Surgeon: Minna Merritts, MD;  Location: [facility];  Service: Cardiovascular;  Laterality: N/A;  . MELANOMA EXCISION Right    arm with skin graft  . MYRINGOTOMY WITH TUBE PLACEMENT Left 08/21/2015   Procedure: MYRINGOTOMY WITH TUBE PLACEMENT;   Surgeon: Carloyn Manner, MD;  Location: [facility];  Service: ENT;  Laterality: Left;  . NASOPHARYNGEAL BIOPSY N/A 11/07/2015   Procedure: NASOPHARYNGEAL BIOPSY;  Surgeon: Carloyn Manner, MD;  Location: [facility];  Service: ENT;  Laterality: N/A;  . TONSILLECTOMY      SOCIAL HISTORY:   Social History   Tobacco Use  . Smoking status: Former Smoker    Types: Cigarettes    Quit date: 04/30/1998    Years since quitting: 20.7  . Smokeless tobacco: Never Used  Substance Use Topics  . Alcohol use: Yes    Alcohol/week: 17.0 standard drinks    Types: 17 Glasses of wine per week    Comment:      FAMILY HISTORY:   Family History  Problem Relation Age of Onset  . Lung cancer Father   . Breast cancer Maternal Grandfather     DRUG ALLERGIES:   Allergies  Allergen Reactions  . Percocet [Oxycodone-Acetaminophen] Nausea And Vomiting  . Statins     Joint pain  . Sulfa Antibiotics Nausea And Vomiting    REVIEW OF SYSTEMS:   Review of Systems  Constitutional: Positive for malaise/fatigue. Negative for chills, fever and weight loss.  HENT: Negative for hearing loss and nosebleeds.   Eyes: Negative for blurred vision, double vision and pain.  Respiratory: Negative for cough, hemoptysis, sputum production, shortness of breath and wheezing.   Cardiovascular: Negative for  chest pain, palpitations, orthopnea and leg swelling.  Gastrointestinal: Negative for abdominal pain, constipation, diarrhea, nausea and vomiting.  Genitourinary: Negative for dysuria and hematuria.  Musculoskeletal: Positive for falls and joint pain. Negative for back pain and myalgias.  Skin: Negative for rash.  Neurological: Negative for dizziness, tremors, sensory change, speech change, focal weakness, seizures and headaches.  Endo/Heme/Allergies: Does not bruise/bleed easily.  Psychiatric/Behavioral: Negative for depression and memory loss. The patient is not nervous/anxious.     MEDICATIONS AT HOME:    Prior to Admission medications   Medication Sig Start Date End Date Taking? Authorizing Provider  aspirin 81 MG tablet Take 81 mg by mouth daily.   Yes [provider]  FLUoxetine (PROZAC) 40 MG capsule Take 40 mg by mouth every evening.    Yes [provider]  insulin lispro (HUMALOG) 100 UNIT/ML injection Inject 0-23 Units into the skin daily. VIA PUMP 09/29/18  Yes [provider]  rosuvastatin (CRESTOR) 10 MG tablet Take 10 mg by mouth at bedtime. 12/05/18  Yes [provider]  clopidogrel (PLAVIX) 75 MG tablet Take 1 tablet (75 mg total) by mouth daily. Patient not taking: Reported on 01/07/2019 08/15/16   Hillary Bow, MD  Glucagon, rDNA, (GLUCAGON EMERGENCY IJ) Inject as directed as needed (for low blood sugar).    [provider]  insulin glargine (LANTUS) 100 UNIT/ML injection Inject 13 Units into the skin daily as needed (If pump does not lower blood sugars). As needed if insulin pump is not working     [provider]  ondansetron (ZOFRAN-ODT) 4 MG disintegrating tablet Take 4 mg by mouth every 8 (eight) hours as needed for nausea or vomiting.    [provider]  pantoprazole (PROTONIX) 40 MG tablet Take 1 tablet (40 mg total) by mouth daily. Patient not taking: Reported on 01/07/2019 09/01/16   Wellington Hampshire, MD     VITAL SIGNS:  Blood pressure (!) 168/92, pulse 82, temperature 98 F (36.7 C), temperature source Oral, resp. rate 11, height 5\' 2"  (1.575 m), weight 51.7 kg, SpO2 100 %.  PHYSICAL EXAMINATION:  Physical Exam  GENERAL:  73 y.o.-year-old patient lying in the bed with no acute distress.  EYES: Pupils equal, round, reactive to light and accommodation. No scleral icterus. Extraocular muscles intact.  HEENT: Head  normocephalic. Oropharynx and nasopharynx clear. No oropharyngeal erythema, moist oral mucosa . Bruising and swelling around right eye.  Small laceration. NECK:  Supple, no jugular venous  distention. No thyroid enlargement, no tenderness.  LUNGS: Normal breath sounds bilaterally, no wheezing, rales, rhonchi. No use of accessory muscles of respiration.  CARDIOVASCULAR: S1, S2 normal. No murmurs, rubs, or gallops.  ABDOMEN: Soft, nontender, nondistended. Bowel sounds present. No organomegaly or mass.  EXTREMITIES: No pedal edema, cyanosis, or clubbing. + 2 pedal & radial pulses b/l.   Right lower extremity externally rotated NEUROLOGIC: Cranial nerves II through XII are intact. No focal Motor or sensory deficits appreciated b/l PSYCHIATRIC: The patient is alert and oriented x 3. Good affect.  SKIN: No obvious rash, lesion, or ulcer.   LABORATORY PANEL:   CBC Recent Labs  Lab 01/07/19 0933  WBC 5.7  HGB 13.5  HCT 39.8  PLT 226   ------------------------------------------------------------------------------------------------------------------  Chemistries  Recent Labs  Lab 01/07/19 0933  NA 138  K 4.1  CL 101  CO2 27  GLUCOSE 212*  BUN 13  CREATININE 0.61  CALCIUM 9.4   ------------------------------------------------------------------------------------------------------------------  Cardiac Enzymes No results for input(s): TROPONINI  in the last 168 hours. ------------------------------------------------------------------------------------------------------------------  RADIOLOGY:  Ct Head Wo Contrast  Result Date: 01/07/2019 CLINICAL DATA:  Acute pain due to fall. Laceration above right eye. EXAM: CT HEAD WITHOUT CONTRAST CT CERVICAL SPINE WITHOUT CONTRAST TECHNIQUE: Multidetector CT imaging of the head and cervical spine was performed following the standard protocol without intravenous contrast. Multiplanar CT image reconstructions of the cervical spine were also generated. COMPARISON:  CT dated November 29, 2015 could not be retrieved for comparison. FINDINGS: CT HEAD FINDINGS Brain: No evidence of acute infarction, hemorrhage, hydrocephalus, extra-axial  collection or mass lesion/mass effect. Age related atrophy and chronic microvascular ischemic changes are noted. There is a lacunar infarct within the right basal ganglia. Vascular: No hyperdense vessel or unexpected calcification. Skull: Normal. Negative for fracture or focal lesion. There is right frontal soft tissue swelling without evidence for an underlying fracture. Sinuses/Orbits: There is mucosal thickening of the left maxillary sinus and bilateral sphenoid sinuses. The remaining paranasal sinuses and mastoid air cells are essentially clear. There is some mild opacification of the left mastoid air cells which is similar to prior study. The previously noted nasopharyngeal mass is better visualized on prior imaging. Other: None. CT CERVICAL SPINE FINDINGS Alignment: Normal. Skull base and vertebrae: No acute fracture. No primary bone lesion or focal pathologic process. Soft tissues and spinal canal: No prevertebral fluid or swelling. No visible canal hematoma. Disc levels: There are mild multilevel degenerative changes throughout the cervical spine, greatest at the C5-C6 and C6-C7 levels. Upper chest: Negative. Other: None IMPRESSION: 1. No evidence for acute intracranial process. 2. Right frontal soft tissue swelling without evidence for an underlying fracture. 3. No evidence for acute fracture or subluxation of the cervical spine. 4. Mild multilevel degenerative changes of the cervical spine. 5. 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 There is persistent unchanged opacification of the left mastoid air cells. Electronically Signed   By: Constance Holster M.D.   On: 01/07/2019 10:45   Ct Cervical Spine Wo Contrast  Result Date: 01/07/2019 CLINICAL DATA:  Acute pain due to fall. Laceration above right eye. EXAM: CT HEAD WITHOUT CONTRAST CT CERVICAL SPINE WITHOUT CONTRAST TECHNIQUE: Multidetector CT imaging of the head and cervical spine was  performed following the standard protocol without intravenous contrast. Multiplanar CT image reconstructions of the cervical spine were also generated. COMPARISON:  CT dated November 29, 2015 could not be retrieved for comparison. FINDINGS: CT HEAD FINDINGS Brain: No evidence of acute infarction, hemorrhage, hydrocephalus, extra-axial collection or mass lesion/mass effect. Age related atrophy and chronic microvascular ischemic changes are noted. There is a lacunar infarct within the right basal ganglia. Vascular: No hyperdense vessel or unexpected calcification. Skull: Normal. Negative for fracture or focal lesion. There is right frontal soft tissue swelling without evidence for an underlying fracture. Sinuses/Orbits: There is mucosal thickening of the left maxillary sinus and bilateral sphenoid sinuses. The remaining paranasal sinuses and mastoid air cells are essentially clear. There is some mild opacification of the left mastoid air cells which is similar to prior study. The previously noted nasopharyngeal mass is better visualized on prior imaging. Other: None. CT CERVICAL SPINE FINDINGS Alignment: Normal. Skull base and vertebrae: No acute fracture. No primary bone lesion or focal pathologic process. Soft tissues and spinal canal: No prevertebral fluid or swelling. No visible canal hematoma. Disc levels: There are mild multilevel degenerative changes throughout the cervical spine, greatest at the C5-C6 and C6-C7 levels. Upper chest: Negative. Other:  None IMPRESSION: 1. No evidence for acute intracranial process. 2. Right frontal soft tissue swelling without evidence for an underlying fracture. 3. No evidence for acute fracture or subluxation of the cervical spine. 4. Mild multilevel degenerative changes of the cervical spine. 5. 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 There is persistent unchanged opacification of the left mastoid air cells.  Electronically Signed   By: Constance Holster M.D.   On: 01/07/2019 10:45   Dg Hip Unilat W Or Wo Pelvis 2-3 Views Right  Result Date: 01/07/2019 CLINICAL DATA:  Status post fall. EXAM: DG HIP (WITH OR WITHOUT PELVIS) 2-3V RIGHT COMPARISON:  None. FINDINGS: Generalized osteopenia. Mildly impacted right subcapital femoral neck fracture. No other acute fracture or dislocation. No aggressive osseous lesion. Peripheral vascular atherosclerotic disease. IMPRESSION: 1. Mildly impacted right subcapital femoral neck fracture. Electronically Signed   By: Kathreen Devoid   On: 01/07/2019 10:57     IMPRESSION AND PLAN:   * Impacted right subcapital femoral neck fracture Patient will be n.p.o.  Pain medications as needed. Case discussed with Dr. Roland Rack of orthopedics.  Surgery likely later today. Physical therapy after surgery. Patient history of CAD and last catheterization 2018 with non-ST elevation MI.  Follows with Dr. Fletcher Anon of [facility] and no other acute issues.  Can proceed with surgery.  We will continue aspirin after surgery.  *Diabetes mellitus.  Continue insulin pump.  Orders placed.  *SCDs ordered.  Lovenox after surgery.  All the records are reviewed and case discussed with ED provider. Management plans discussed with the patient, family and they are in agreement.  CODE STATUS: DNR/DNI  TOTAL TIME TAKING CARE OF THIS PATIENT: 40 minutes.   Leia Alf Izear Pine M.D on 01/07/2019 at 12:51 PM  Between 7am to 6pm - Pager - [phone]  After 6pm go to [email or web address] - password [email or web address] [facility]  Office  [phone]  CC: Primary care physician; Valera Castle, MD  Note: This dictation was prepared with Dragon dictation along with smaller phrase technology. Any transcriptional errors that result from this process are unintentional.

## 2019-01-07 NOTE — ED Triage Notes (Signed)
Pt to ED by EMS from home after a mechanical fall where she hit her head and suffered a 1-1.5" laceration above her right eye. Pt also has c/o of right hip pain. No deformity noted.

## 2019-01-07 NOTE — Anesthesia Procedure Notes (Signed)
Spinal  Start time: 01/07/2019 5:09 PM End time: 01/07/2019 5:14 PM Staffing Anesthesiologist: Piscitello, Precious Haws, MD Other anesthesia staff: Adalberto Ill, CRNA Performed: resident/CRNA  Preanesthetic Checklist Completed: patient identified, site marked, surgical consent, pre-op evaluation, timeout performed, IV checked, risks and benefits discussed and monitors and equipment checked Spinal Block Patient position: sitting Prep: ChloraPrep Patient monitoring: heart rate, cardiac monitor, continuous pulse ox and blood pressure Approach: midline Location: L4-5 Injection technique: single-shot Needle Needle type: Pencan  Needle gauge: 25 G Assessment Sensory level: T8 Additional Notes Sterile prep drape gown and gloved prep dried at time of local, lidocaine 1% skin wheel L4-5 one attempt introducer and spinal needle positive CSF flow and barbotage at beginning and end of placement of 2.11mL 0.5%isobaric bupivicaine MPF [id number] exp 11/23 MD Piscitello supervised

## 2019-01-08 LAB — BASIC METABOLIC PANEL
Anion gap: 8 (ref 5–15)
BUN: 20 mg/dL (ref 8–23)
CO2: 23 mmol/L (ref 22–32)
Calcium: 8 mg/dL — ABNORMAL LOW (ref 8.9–10.3)
Chloride: 105 mmol/L (ref 98–111)
Creatinine, Ser: 0.78 mg/dL (ref 0.44–1.00)
GFR calc Af Amer: >60 mL/min (ref >60)
GFR calc non Af Amer: >60 mL/min (ref >60)
Glucose, Bld: 250 mg/dL — ABNORMAL HIGH (ref 70–99)
Potassium: 4.6 mmol/L (ref 3.5–5.1)
Sodium: 136 mmol/L (ref 135–145)

## 2019-01-08 LAB — CBC
HCT: 29.1 % — ABNORMAL LOW (ref 36.0–46.0)
Hemoglobin: 9.5 g/dL — ABNORMAL LOW (ref 12.0–15.0)
MCH: 31.7 pg (ref 26.0–34.0)
MCHC: 32.6 g/dL (ref 30.0–36.0)
MCV: 97 fL (ref 80.0–100.0)
Platelets: 155 10*3/uL (ref 150–400)
RBC: 3 MIL/uL — ABNORMAL LOW (ref 3.87–5.11)
RDW: 12.4 % (ref 11.5–15.5)
WBC: 7.3 10*3/uL (ref 4.0–10.5)
nRBC: 0 % (ref 0.0–0.2)

## 2019-01-08 LAB — GLUCOSE, CAPILLARY
Glucose-Capillary: 130 mg/dL — ABNORMAL HIGH (ref 70–99)
Glucose-Capillary: 160 mg/dL — ABNORMAL HIGH (ref 70–99)
Glucose-Capillary: 192 mg/dL — ABNORMAL HIGH (ref 70–99)
Glucose-Capillary: 200 mg/dL — ABNORMAL HIGH (ref 70–99)
Glucose-Capillary: 209 mg/dL — ABNORMAL HIGH (ref 70–99)
Glucose-Capillary: 262 mg/dL — ABNORMAL HIGH (ref 70–99)

## 2019-01-08 LAB — HEMOGLOBIN A1C
Hgb A1c MFr Bld: 8.8 % — ABNORMAL HIGH (ref 4.8–5.6)
Mean Plasma Glucose: 205.86 mg/dL

## 2019-01-08 NOTE — Progress Notes (Signed)
  Subjective: 1 Day Post-Op Procedure(s) (LRB): CANNULATED HIP PINNING (Right) Patient reports pain as mild.   Patient seen in rounds with Dr. Roland Rack. Patient is well, and has had no acute complaints or problems Plan is to go Home after hospital stay. Negative for chest pain and shortness of breath Fever: no Gastrointestinal: Negative for nausea and vomiting  Objective: Vital signs in last 24 hours: Temp:  [97 F (36.1 C)-98.8 F (37.1 C)] 98.3 F (36.8 C) (10/04 0459) Pulse Rate:  [82-114] 96 (10/04 0459) Resp:  [10-18] 16 (10/04 0459) BP: (95-168)/(51-92) 95/56 (10/04 0459) SpO2:  [94 %-100 %] 99 % (10/04 0459) Weight:  [51.7 kg] 51.7 kg (10/03 0922)  Intake/Output from previous day:  Intake/Output Summary (Last 24 hours) at 01/08/2019 0728 Last data filed at 01/08/2019 0619 Gross per 24 hour  Intake 755.46 ml  Output 775 ml  Net -19.54 ml    Intake/Output this shift: No intake/output data recorded.  Labs: Recent Labs    01/07/19 0933 01/08/19 0513  HGB 13.5 9.5*   Recent Labs    01/07/19 0933 01/08/19 0513  WBC 5.7 7.3  RBC 4.22 3.00*  HCT 39.8 29.1*  PLT 226 155   Recent Labs    01/07/19 0933 01/08/19 0513  NA 138 136  K 4.1 4.6  CL 101 105  CO2 27 23  BUN 13 20  CREATININE 0.61 0.78  GLUCOSE 212* 250*  CALCIUM 9.4 8.0*   Recent Labs    01/07/19 1450  INR 1.0     EXAM General - Patient is Alert and Oriented Extremity - Neurovascular intact Sensation intact distally Compartment soft Dressing/Incision - clean, dry, no drainage Motor Function - intact, moving foot and toes well on exam.   Past Medical History:  Diagnosis Date  . Coronary artery disease   . Diabetes mellitus without complication (HCC)    insulin pump  . Headache    since ear problem started  . Hypertension   . Myocardial infarction ([place]) 11/01/13    Assessment/Plan: 1 Day Post-Op Procedure(s) (LRB): CANNULATED HIP PINNING (Right) Active Problems:   Closed right  hip fracture, initial encounter ([place])  Estimated body mass index is 20.85 kg/m as calculated from the following:   Height as of this encounter: 5\' 2"  (1.575 m).   Weight as of this encounter: 51.7 kg. Advance diet Up with therapy D/C IV fluids Discharge home with home health after medical clearance  DVT Prophylaxis - Lovenox, Foot Pumps and TED hose Weight-Bearing as tolerated to right leg  [name], PA-C Orthopaedic Surgery 01/08/2019, 7:28 AM

## 2019-01-08 NOTE — Anesthesia Postprocedure Evaluation (Signed)
Anesthesia Post Note  Patient: Diana Frost  Procedure(s) Performed: CANNULATED HIP PINNING (Right Hip)  Patient location during evaluation: Nursing Unit Anesthesia Type: Spinal Level of consciousness: awake and alert Pain management: pain level controlled Vital Signs Assessment: post-procedure vital signs reviewed and stable Respiratory status: spontaneous breathing and respiratory function stable Cardiovascular status: blood pressure returned to baseline and stable Postop Assessment: spinal receding Anesthetic complications: no     Last Vitals:  Vitals:   01/08/19 0459 01/08/19 0752  BP: (!) 95/56 (!) 95/52  Pulse: 96 85  Resp: 16   Temp: 36.8 C 36.8 C  SpO2: 99% 95%    Last Pain:  Vitals:   01/08/19 0752  TempSrc: Oral  PainSc:                  Martha Clan

## 2019-01-08 NOTE — Progress Notes (Signed)
Physical Therapy Evaluation Patient Details Name: Diana Frost MRN: [id number] DOB: 1945/04/24 Today's Date: 01/08/2019   History of Present Illness  Hurley Bosen  is a 73 y.o. female with a known history of CAD, diabetes mellitus on insulin pump presents to the emergency room after having a mechanical fall.  Patient was walking out to pick the newspaper and tripped on a ramp.  Landing on the right hip.  She does have bruising and laceration above her right eye.  CT scan of the head and neck have shown nothing acute.  She does have on x-ray impacted right subcapital femoral neck fracture.. S/P hip pinning.   Clinical Impression  Patient is cooperative and motivated for therapy. She needs mod/max assist to perform supine to sit with total assist of RLE to move to edge of bed and mod assist to pull her hips fwd to edge of bed to seated position. She needs VC for correct hand placement for sit to stand with RW. She is able to stand with CGA and move her RLE fwd / bwd x 5 reps, seated she is able to perform LAQ with assist. She is only able to stand for 2 mins due to fatigue and RLE pain and is unable to take a step today with RW. She will continue to benefit from skilled PT to progress her mobility.     Follow Up Recommendations SNF    Equipment Recommendations  Rolling walker with 5" wheels    Recommendations for Other Services       Precautions / Restrictions Precautions Precautions: (WBAT RLE) Restrictions Weight Bearing Restrictions: Yes RLE Weight Bearing: Weight bearing as tolerated      Mobility  Bed Mobility Overal bed mobility: Needs Assistance Bed Mobility: Supine to Sit;Sit to Supine     Supine to sit: Mod assist Sit to supine: Mod assist   General bed mobility comments: (Patient needs to be scooted fwd to edge of bed , leg support)  Transfers Overall transfer level: Needs assistance Equipment used: Rolling walker (2 wheeled)             General  transfer comment: (needs cueing for hand placement and mod assist)  Ambulation/Gait Ambulation/Gait assistance: (not able to stand for longer than 2 mins due to pain)              Stairs            Wheelchair Mobility    Modified Rankin (Stroke Patients Only)       Balance Overall balance assessment: Modified Independent                                           Pertinent Vitals/Pain Pain Assessment: 0-10 Pain Score: 5  Pain Location: right hip Pain Descriptors / Indicators: Aching Pain Intervention(s): Limited activity within patient's tolerance;Monitored during session;Repositioned    Home Living Family/patient expects to be discharged to:: Skilled nursing facility Living Arrangements: Spouse/significant other                    Prior Function Level of Independence: Independent               Hand Dominance   Dominant Hand: Right    Extremity/Trunk Assessment   Upper Extremity Assessment Upper Extremity Assessment: Overall WFL for tasks assessed    Lower Extremity Assessment Lower Extremity Assessment: RLE deficits/detail  RLE: (2/5 hip flex and abd) RLE Sensation: WNL       Communication   Communication: No difficulties  Cognition Arousal/Alertness: Awake/alert Behavior During Therapy: WFL for tasks assessed/performed Overall Cognitive Status: Within Functional Limits for tasks assessed                                        General Comments General comments (skin integrity, edema, etc.): (bruise on right foot, under right eye)    Exercises General Exercises - Lower Extremity Ankle Circles/Pumps: AAROM Long Arc Quad: AROM;Strengthening;Right;Left;10 reps   Assessment/Plan    PT Assessment Patient needs continued PT services  PT Problem List Decreased strength;Decreased range of motion;Decreased activity tolerance;Decreased mobility;Decreased safety awareness;Decreased knowledge of  precautions;Pain       PT Treatment Interventions Gait training;Therapeutic activities;Therapeutic exercise    PT Goals (Current goals can be found in the Care Plan section)  Acute Rehab PT Goals Patient Stated Goal: to walk PT Goal Formulation: With patient Time For Goal Achievement: 01/22/19 Potential to Achieve Goals: Good    Frequency BID   Barriers to discharge Decreased caregiver support (Patient needs to improve mobiltiy to be able to go home)    Co-evaluation               AM-PAC PT "6 Clicks" Mobility  Outcome Measure Help needed turning from your back to your side while in a flat bed without using bedrails?: A Lot Help needed moving from lying on your back to sitting on the side of a flat bed without using bedrails?: A Lot Help needed moving to and from a bed to a chair (including a wheelchair)?: A Lot Help needed standing up from a chair using your arms (e.g., wheelchair or bedside chair)?: A Lot Help needed to walk in hospital room?: Total Help needed climbing 3-5 steps with a railing? : Total 6 Click Score: 10    End of Session Equipment Utilized During Treatment: Gait belt Activity Tolerance: Patient limited by fatigue;Patient limited by pain Patient left: in bed;with call bell/phone within reach;with bed alarm set Nurse Communication: Mobility status PT Visit Diagnosis: Unsteadiness on feet (R26.81);Other abnormalities of gait and mobility (R26.89);Muscle weakness (generalized) (M62.81);History of falling (Z91.81);Difficulty in walking, not elsewhere classified (R26.2);Pain Pain - Right/Left: Right Pain - part of body: Hip    Time: [id number] PT Time Calculation (min) (ACUTE ONLY): 30 min   Charges:   PT Evaluation $PT Eval Low Complexity: 1 Low PT Treatments $Therapeutic Activity: 8-22 mins         Arelia Sneddon S, PT DPT 01/08/2019, 11:12 AM

## 2019-01-08 NOTE — Plan of Care (Signed)
Patient denies pain or discomfort.  Medicated with good results for nausea episode during breakfast with prn Zofran.  Patient declined Lantus, awaiting daughter to arrive with insulin pump to discuss management with MD according to a previous discussion prior to surgery.  Respositioned in bed.  Call bell in reach.  Will continue to monitor.

## 2019-01-08 NOTE — Progress Notes (Signed)
Patient's daughter [name] helped her place her insulin pump on the upper left abdomen. Patient dosed herself 4.7 units of Novolog for CBG of 268.  Will continue to monitor.

## 2019-01-08 NOTE — Progress Notes (Addendum)
[facility] [place] NAME: Diana Frost    MR#:  [id number]  DATE OF BIRTH:  1945-04-14  SUBJECTIVE:  CHIEF COMPLAINT:   Chief Complaint  Patient presents with   Fall   -Patient came in after a fall and right femoral neck fracture.  Status post surgery, doing very well today.  Awaiting physical therapy input  REVIEW OF SYSTEMS:  Review of Systems  Constitutional: Positive for malaise/fatigue. Negative for chills and fever.  HENT: Negative for congestion, ear discharge, hearing loss and nosebleeds.   Eyes: Negative for blurred vision, double vision and photophobia.  Respiratory: Negative for cough, shortness of breath and wheezing.   Cardiovascular: Negative for chest pain and palpitations.  Gastrointestinal: Negative for abdominal pain, constipation, diarrhea, nausea and vomiting.  Genitourinary: Negative for dysuria.  Musculoskeletal: Positive for joint pain and myalgias.  Neurological: Positive for weakness. Negative for dizziness, speech change, focal weakness, seizures and headaches.  Psychiatric/Behavioral: Negative for depression.    DRUG ALLERGIES:   Allergies  Allergen Reactions   Percocet [Oxycodone-Acetaminophen] Nausea And Vomiting   Statins     Joint pain   Sulfa Antibiotics Nausea And Vomiting    VITALS:  Blood pressure (!) 95/52, pulse 85, temperature 98.3 F (36.8 C), temperature source Oral, resp. rate 16, height 5\' 2"  (1.575 m), weight 51.7 kg, SpO2 96 %.  PHYSICAL EXAMINATION:  Physical Exam   GENERAL:  73 y.o.-year-old patient lying in the bed with no acute distress.  EYES: Pupils equal, round, reactive to light and accommodation. No scleral icterus. Extraocular muscles intact.  Bruising around the right eye HEENT: Head atraumatic, normocephalic. Oropharynx and nasopharynx clear.  NECK:  Supple, no jugular venous distention. No thyroid enlargement, no tenderness.  LUNGS: Normal breath sounds  bilaterally, no wheezing, rales,rhonchi or crepitation. No use of accessory muscles of respiration.  CARDIOVASCULAR: S1, S2 normal. No  rubs, or gallops.  2/6 systolic murmur is present ABDOMEN: Soft, nontender, nondistended. Bowel sounds present. No organomegaly or mass.  EXTREMITIES: No pedal edema, cyanosis, or clubbing.  Right hip dressing in place.  No bleeding noted NEUROLOGIC: Cranial nerves II through XII are intact. Muscle strength equal in all extremities except right leg due to pain. Sensation intact. Gait not checked.  PSYCHIATRIC: The patient is alert and oriented x 3.  SKIN: No obvious rash, lesion, or ulcer.    LABORATORY PANEL:   CBC Recent Labs  Lab 01/08/19 0513  WBC 7.3  HGB 9.5*  HCT 29.1*  PLT 155   ------------------------------------------------------------------------------------------------------------------  Chemistries  Recent Labs  Lab 01/08/19 0513  NA 136  K 4.6  CL 105  CO2 23  GLUCOSE 250*  BUN 20  CREATININE 0.78  CALCIUM 8.0*   ------------------------------------------------------------------------------------------------------------------  Cardiac Enzymes No results for input(s): TROPONINI in the last 168 hours. ------------------------------------------------------------------------------------------------------------------  RADIOLOGY:  Ct Head Wo Contrast  Result Date: 01/07/2019 CLINICAL DATA:  Acute pain due to fall. Laceration above right eye. EXAM: CT HEAD WITHOUT CONTRAST CT CERVICAL SPINE WITHOUT CONTRAST TECHNIQUE: Multidetector CT imaging of the head and cervical spine was performed following the standard protocol without intravenous contrast. Multiplanar CT image reconstructions of the cervical spine were also generated. COMPARISON:  CT dated November 29, 2015 could not be retrieved for comparison. FINDINGS: CT HEAD FINDINGS Brain: No evidence of acute infarction, hemorrhage, hydrocephalus, extra-axial collection or mass  lesion/mass effect. Age related atrophy and chronic microvascular ischemic changes are noted. There is a lacunar infarct within  the right basal ganglia. Vascular: No hyperdense vessel or unexpected calcification. Skull: Normal. Negative for fracture or focal lesion. There is right frontal soft tissue swelling without evidence for an underlying fracture. Sinuses/Orbits: There is mucosal thickening of the left maxillary sinus and bilateral sphenoid sinuses. The remaining paranasal sinuses and mastoid air cells are essentially clear. There is some mild opacification of the left mastoid air cells which is similar to prior study. The previously noted nasopharyngeal mass is better visualized on prior imaging. Other: None. CT CERVICAL SPINE FINDINGS Alignment: Normal. Skull base and vertebrae: No acute fracture. No primary bone lesion or focal pathologic process. Soft tissues and spinal canal: No prevertebral fluid or swelling. No visible canal hematoma. Disc levels: There are mild multilevel degenerative changes throughout the cervical spine, greatest at the C5-C6 and C6-C7 levels. Upper chest: Negative. Other: None IMPRESSION: 1. No evidence for acute intracranial process. 2. Right frontal soft tissue swelling without evidence for an underlying fracture. 3. No evidence for acute fracture or subluxation of the cervical spine. 4. Mild multilevel degenerative changes of the cervical spine. 5. 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 There is persistent unchanged opacification of the left mastoid air cells. Electronically Signed   By: Constance Holster M.D.   On: 01/07/2019 10:45   Ct Cervical Spine Wo Contrast  Result Date: 01/07/2019 CLINICAL DATA:  Acute pain due to fall. Laceration above right eye. EXAM: CT HEAD WITHOUT CONTRAST CT CERVICAL SPINE WITHOUT CONTRAST TECHNIQUE: Multidetector CT imaging of the head and cervical spine was performed following  the standard protocol without intravenous contrast. Multiplanar CT image reconstructions of the cervical spine were also generated. COMPARISON:  CT dated November 29, 2015 could not be retrieved for comparison. FINDINGS: CT HEAD FINDINGS Brain: No evidence of acute infarction, hemorrhage, hydrocephalus, extra-axial collection or mass lesion/mass effect. Age related atrophy and chronic microvascular ischemic changes are noted. There is a lacunar infarct within the right basal ganglia. Vascular: No hyperdense vessel or unexpected calcification. Skull: Normal. Negative for fracture or focal lesion. There is right frontal soft tissue swelling without evidence for an underlying fracture. Sinuses/Orbits: There is mucosal thickening of the left maxillary sinus and bilateral sphenoid sinuses. The remaining paranasal sinuses and mastoid air cells are essentially clear. There is some mild opacification of the left mastoid air cells which is similar to prior study. The previously noted nasopharyngeal mass is better visualized on prior imaging. Other: None. CT CERVICAL SPINE FINDINGS Alignment: Normal. Skull base and vertebrae: No acute fracture. No primary bone lesion or focal pathologic process. Soft tissues and spinal canal: No prevertebral fluid or swelling. No visible canal hematoma. Disc levels: There are mild multilevel degenerative changes throughout the cervical spine, greatest at the C5-C6 and C6-C7 levels. Upper chest: Negative. Other: None IMPRESSION: 1. No evidence for acute intracranial process. 2. Right frontal soft tissue swelling without evidence for an underlying fracture. 3. No evidence for acute fracture or subluxation of the cervical spine. 4. Mild multilevel degenerative changes of the cervical spine. 5. 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 There is persistent unchanged opacification of the left mastoid air cells. Electronically Signed   By:  Constance Holster M.D.   On: 01/07/2019 10:45   Dg Hip Operative Unilat W Or W/o Pelvis Right  Result Date: 01/07/2019 CLINICAL DATA:  73 year old female with history of right hip fracture status post ORIF. EXAM: OPERATIVE RIGHT HIP (WITH  PELVIS IF PERFORMED) 2 VIEWS TECHNIQUE: Fluoroscopic spot image(s) were submitted for interpretation post-operatively. COMPARISON:  01/07/2019. FINDINGS: Three cannulated fixation screws traverse the previously demonstrated subcapital femoral neck fracture. Anatomic alignment has been preserved. Femoral head remains located in the acetabulum. No acute findings. IMPRESSION: Intraoperative documentation of ORIF for right subcapital femoral neck fracture, as above. Electronically Signed   By: Vinnie Langton M.D.   On: 01/07/2019 18:28   Dg Hip Unilat W Or Wo Pelvis 2-3 Views Right  Result Date: 01/07/2019 CLINICAL DATA:  Status post fall. EXAM: DG HIP (WITH OR WITHOUT PELVIS) 2-3V RIGHT COMPARISON:  None. FINDINGS: Generalized osteopenia. Mildly impacted right subcapital femoral neck fracture. No other acute fracture or dislocation. No aggressive osseous lesion. Peripheral vascular atherosclerotic disease. IMPRESSION: 1. Mildly impacted right subcapital femoral neck fracture. Electronically Signed   By: Kathreen Devoid   On: 01/07/2019 10:57    EKG:   Orders placed or performed during the hospital encounter of 01/07/19   EKG 12-Lead   EKG 12-Lead    ASSESSMENT AND PLAN:   73 year old female with past medical history significant for type 1 diabetes mellitus on insulin pump, CAD, hypertension presents to hospital secondary to mechanical fall and right femoral neck fracture.  1.  Right subcapital femoral neck fracture-status post surgery, postop day 1 today -Appreciate orthopedics consult -Continue to monitor hemoglobin.  Pain control -Physical therapy consult to evaluate if she needs rehab placement.  2.  Diabetes mellitus-discontinue Lantus as patient can  start her insulin pump today -Continue sliding scale insulin.  3.  CAD-stable.  Plavix is on hold for surgery  4.  DVT prophylaxis-started on Lovenox    All the records are reviewed and case discussed with Care Management/Social Workerr. Management plans discussed with the patient, family and they are in agreement.  CODE STATUS: DNR  TOTAL TIME TAKING CARE OF THIS PATIENT: 38 minutes.   POSSIBLE D/C IN 2 DAYS, DEPENDING ON CLINICAL CONDITION.   Gladstone Lighter M.D on 01/08/2019 at 1:02 PM  Between 7am to 6pm - Pager - [phone]  After 6pm go to [email or web address] - password [email or web address] [facility]  Office  [phone]  CC: Primary care physician; Valera Castle, MD

## 2019-01-08 NOTE — TOC Initial Note (Signed)
Transition of Care [facility]) - Initial/Assessment Note    Patient Details  Name: Diana Frost MRN: [id number] Date of Birth: [birth year]  Transition of Care [facility]) CM/SW Contact:    Latanya Maudlin, RN Phone Number: 01/08/2019, 1:10 PM  Clinical Narrative:  TOC consulted for disposition. PT is currently recommending SNF. Patient and daughter in room. Patient lives with her spouse. They are not yet ready to discuss disposition as they feel patient will progress witht therapy prior to discharge. Daughter does give me permission to begin SNF workup in case they are not able to discharge home. FL2 done, sent out and offers are pending. Patient reports she will need DME if she goes home. Likely will need at least a bedside commode and rolling walker. Patients [facility] has used [facility] in the past and should they go home they prefer to use [facility]. TOC team will follow for disposition.                Expected Discharge Plan: [place] Barriers to Discharge: Continued Medical Work up   Patient Goals and CMS Choice   CMS Medicare.gov Compare Post Acute Care list provided to:: Patient Choice offered to / list presented to : Patient, Adult Children  Expected Discharge Plan and Services Expected Discharge Plan: [place]     Post Acute Care Choice: Durable Medical Equipment, Home Health                                        Prior Living Arrangements/Services                       Activities of Daily Living Home Assistive Devices/Equipment: Eyeglasses, Insulin Pump ADL Screening (condition at time of admission) Patient's cognitive ability adequate to safely complete daily activities?: Yes Is the patient deaf or have difficulty hearing?: Yes Does the patient have difficulty seeing, even when wearing glasses/contacts?: No Does the patient have difficulty concentrating, remembering, or making decisions?: Yes(occassionally) Patient  able to express need for assistance with ADLs?: Yes Does the patient have difficulty dressing or bathing?: No Independently performs ADLs?: Yes (appropriate for developmental age) Does the patient have difficulty walking or climbing stairs?: No Weakness of Legs: Right(d/t injury) Weakness of Arms/Hands: None  Permission Sought/Granted                  Emotional Assessment              Admission diagnosis:  Hip fracture, right ([place]) [S72.001A] Closed fracture of neck of right femur, initial encounter ([place]) [S72.001A] Laceration of right eyebrow, initial encounter [S01.111A] Patient Active Problem List   Diagnosis Date Noted  . Closed right hip fracture, initial encounter ([place]) 01/07/2019  . Malnutrition of moderate degree 08/14/2016  . Non-ST elevation (NSTEMI) myocardial infarction ([place]) 08/13/2016  . 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 AKI (acute kidney injury) ([place]) 08/13/2016  . Hypokalemia 08/13/2016  . DKA (diabetic ketoacidoses) ([place]) 08/12/2016  . Melanoma ([place]) 11/01/2015  . Stable angina ([place]) 10/10/2015  . Coronary artery disease involving native coronary artery of native heart 11/16/2012  . History of insertion of insulin pump 05/24/2012  . Type 1 diabetes mellitus ([place]) 02/11/2011  . Depression 02/11/2011   PCP:  Valera Castle, MD Pharmacy:   [facility], [address]  [place] [address] Phone: [phone] Fax: [phone]     Social Determinants of Health (SDOH) Interventions    Readmission Risk Interventions No flowsheet data found.

## 2019-01-08 NOTE — NC FL2 (Signed)
[place] LEVEL OF CARE SCREENING TOOL     IDENTIFICATION  Patient Name: Diana Frost Birthdate: 05-Feb-1946 Sex: female Admission Date (Current Location): 01/07/2019  [place] and [place] Number:  Engineering geologist and Address:  [facility], [address] [place], [place] [address]      Provider Number: [id number]  Attending Physician Name and Address:  Gladstone Lighter, MD  Relative Name and Phone Number:  Seva, Kazanjian Spouse [phone]    Current Level of Care: Hospital Recommended Level of Care: [place] Prior Approval Number:    Date Approved/Denied:   PASRR Number: [id number]  Discharge Plan: SNF    Current Diagnoses: Patient Active Problem List   Diagnosis Date Noted  . Closed right hip fracture, initial encounter ([place]) 01/07/2019  . Malnutrition of moderate degree 08/14/2016  . Non-ST elevation (NSTEMI) myocardial infarction ([place]) 08/13/2016  . 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 AKI (acute kidney injury) ([place]) 08/13/2016  . Hypokalemia 08/13/2016  . DKA (diabetic ketoacidoses) ([place]) 08/12/2016  . Melanoma ([place]) 11/01/2015  . Stable angina ([place]) 10/10/2015  . Coronary artery disease involving native coronary artery of native heart 11/16/2012  . History of insertion of insulin pump 05/24/2012  . Type 1 diabetes mellitus ([place]) 02/11/2011  . Depression 02/11/2011    Orientation RESPIRATION BLADDER Height & Weight     Self, Time, Situation, Place  Normal Continent Weight: 51.7 kg Height:  5\' 2"  (0000000 cm)  BEHAVIORAL SYMPTOMS/MOOD NEUROLOGICAL BOWEL NUTRITION STATUS      Continent    AMBULATORY STATUS COMMUNICATION OF NEEDS Skin   Limited Assist Verbally Skin abrasions, Surgical wounds                       Personal Care Assistance Level of Assistance  Bathing, Feeding, Dressing, Total care Bathing Assistance: Limited assistance Feeding assistance: Independent Dressing  Assistance: Limited assistance Total Care Assistance: Limited assistance   Functional Limitations Info  Sight, Speech, Hearing Sight Info: Adequate Hearing Info: Adequate Speech Info: Adequate    SPECIAL CARE FACTORS FREQUENCY  PT (By licensed PT), OT (By licensed OT)     PT Frequency: 5x per week OT Frequency: 5x per week            Contractures Contractures Info: Not present    Additional Factors Info  Code Status, Allergies Code Status Info: Full code Allergies Info: percocet, statins, sulfa drugs           Current Medications (01/08/2019):  This is the current hospital active medication list Current Facility-Administered Medications  Medication Dose Route Frequency Provider Last Rate Last Dose  . 0.9 %  sodium chloride infusion   Intravenous Continuous Poggi, Marshall Cork, MD 75 mL/hr at 01/08/19 1304    . acetaminophen (TYLENOL) tablet 325-650 mg  325-650 mg Oral Q6H PRN Poggi, Marshall Cork, MD      . acetaminophen (TYLENOL) tablet 500 mg  500 mg Oral Q6H Poggi, Marshall Cork, MD   500 mg at 01/08/19 0816  . albuterol (PROVENTIL) (2.5 MG/3ML) 0.083% nebulizer solution 2.5 mg  2.5 mg Nebulization Q2H PRN Poggi, Marshall Cork, MD      . aspirin EC tablet 81 mg  81 mg Oral Daily Hillary Bow, MD   81 mg at 01/08/19 0951  . bisacodyl (DULCOLAX) suppository 10 mg  10 mg Rectal Daily PRN Poggi, Marshall Cork, MD      . diphenhydrAMINE (BENADRYL) 12.5 MG/5ML elixir 12.5-25 mg  12.5-25 mg  Oral Q4H PRN Poggi, Marshall Cork, MD      . docusate sodium (COLACE) capsule 100 mg  100 mg Oral BID Poggi, Marshall Cork, MD      . docusate sodium (COLACE) capsule 100 mg  100 mg Oral BID Corky Mull, MD   100 mg at 01/08/19 0950  . enoxaparin (LOVENOX) injection 40 mg  40 mg Subcutaneous Q24H Poggi, Marshall Cork, MD   40 mg at 01/08/19 0816  . FLUoxetine (PROZAC) capsule 40 mg  40 mg Oral QPM Hillary Bow, MD   40 mg at 01/07/19 2203  . HYDROcodone-acetaminophen (NORCO/VICODIN) 5-325 MG per tablet 1-2 tablet  1-2 tablet Oral Q4H PRN  Poggi, Marshall Cork, MD   2 tablet at 01/08/19 0238  . insulin aspart (novoLOG) injection 0-9 Units  0-9 Units Subcutaneous Q4H Poggi, Marshall Cork, MD   2 Units at 01/08/19 0815  . magnesium hydroxide (MILK OF MAGNESIA) suspension 30 mL  30 mL Oral Daily PRN Poggi, Marshall Cork, MD      . metoCLOPramide (REGLAN) tablet 5-10 mg  5-10 mg Oral Q8H PRN Poggi, Marshall Cork, MD       Or  . metoCLOPramide (REGLAN) injection 5-10 mg  5-10 mg Intravenous Q8H PRN Poggi, Marshall Cork, MD      . morphine 2 MG/ML injection 2 mg  2 mg Intravenous Q4H PRN Poggi, Marshall Cork, MD      . ondansetron (ZOFRAN) tablet 4 mg  4 mg Oral Q6H PRN Poggi, Marshall Cork, MD       Or  . ondansetron (ZOFRAN) injection 4 mg  4 mg Intravenous Q6H PRN Poggi, Marshall Cork, MD   4 mg at 01/08/19 0835  . polyethylene glycol (MIRALAX / GLYCOLAX) packet 17 g  17 g Oral Daily PRN Poggi, Marshall Cork, MD      . rosuvastatin (CRESTOR) tablet 10 mg  10 mg Oral QHS Hillary Bow, MD   10 mg at 01/07/19 2115  . sodium phosphate (FLEET) 7-19 GM/118ML enema 1 enema  1 enema Rectal Once PRN Poggi, Marshall Cork, MD      . traMADol Veatrice Bourbon) tablet 50 mg  50 mg Oral Q6H PRN Poggi, Marshall Cork, MD   50 mg at 01/08/19 1229     Discharge Medications: Please see discharge summary for a list of discharge medications.  Relevant Imaging Results:  Relevant Lab Results:   Additional Information ss [id number]  Latanya Maudlin, RN

## 2019-01-09 ENCOUNTER — Encounter: Payer: Self-pay | Admitting: Surgery

## 2019-01-09 LAB — SARS CORONAVIRUS 2 (TAT 6-24 HRS): SARS Coronavirus 2: NEGATIVE

## 2019-01-09 LAB — CBC
HCT: 31.2 % — ABNORMAL LOW (ref 36.0–46.0)
Hemoglobin: 10.1 g/dL — ABNORMAL LOW (ref 12.0–15.0)
MCH: 30.9 pg (ref 26.0–34.0)
MCHC: 32.4 g/dL (ref 30.0–36.0)
MCV: 95.4 fL (ref 80.0–100.0)
Platelets: 166 10*3/uL (ref 150–400)
RBC: 3.27 MIL/uL — ABNORMAL LOW (ref 3.87–5.11)
RDW: 12.3 % (ref 11.5–15.5)
WBC: 10 10*3/uL (ref 4.0–10.5)
nRBC: 0 % (ref 0.0–0.2)

## 2019-01-09 LAB — BASIC METABOLIC PANEL
Anion gap: 11 (ref 5–15)
BUN: 15 mg/dL (ref 8–23)
CO2: 21 mmol/L — ABNORMAL LOW (ref 22–32)
Calcium: 8.3 mg/dL — ABNORMAL LOW (ref 8.9–10.3)
Chloride: 105 mmol/L (ref 98–111)
Creatinine, Ser: 0.65 mg/dL (ref 0.44–1.00)
GFR calc Af Amer: >60 mL/min (ref >60)
GFR calc non Af Amer: >60 mL/min (ref >60)
Glucose, Bld: 200 mg/dL — ABNORMAL HIGH (ref 70–99)
Potassium: 4 mmol/L (ref 3.5–5.1)
Sodium: 137 mmol/L (ref 135–145)

## 2019-01-09 LAB — GLUCOSE, CAPILLARY
Glucose-Capillary: 100 mg/dL — ABNORMAL HIGH (ref 70–99)
Glucose-Capillary: 125 mg/dL — ABNORMAL HIGH (ref 70–99)
Glucose-Capillary: 128 mg/dL — ABNORMAL HIGH (ref 70–99)
Glucose-Capillary: 135 mg/dL — ABNORMAL HIGH (ref 70–99)
Glucose-Capillary: 162 mg/dL — ABNORMAL HIGH (ref 70–99)
Glucose-Capillary: 188 mg/dL — ABNORMAL HIGH (ref 70–99)
Glucose-Capillary: 204 mg/dL — ABNORMAL HIGH (ref 70–99)
Glucose-Capillary: 215 mg/dL — ABNORMAL HIGH (ref 70–99)

## 2019-01-09 MED ORDER — PROCHLORPERAZINE EDISYLATE 10 MG/2ML IJ SOLN
10.0000 mg | Freq: Four times a day (QID) | INTRAMUSCULAR | Status: DC | PRN
  Filled 2019-01-09: qty 2

## 2019-01-09 MED ORDER — INSULIN PUMP
Freq: Three times a day (TID) | SUBCUTANEOUS | Status: DC
  Administered 2019-01-09: 0.2 via SUBCUTANEOUS
  Administered 2019-01-09: 5.8 via SUBCUTANEOUS
  Administered 2019-01-10: 05:00:00 via SUBCUTANEOUS
  Administered 2019-01-10: 5 via SUBCUTANEOUS
  Administered 2019-01-10 (×2): via SUBCUTANEOUS
  Filled 2019-01-09: qty 1

## 2019-01-09 NOTE — Progress Notes (Signed)
Physical Therapy Treatment Patient Details Name: Diana Frost MRN: [id number] DOB: 1945-09-25 Today's Date: 01/09/2019    History of Present Illness Diana Frost  is a 73 y.o. female with a known history of CAD, diabetes mellitus on insulin pump presents to the emergency room after having a mechanical fall.  Patient was walking out to pick the newspaper and tripped on a ramp.  Landing on the right hip.  She does have bruising and laceration above her right eye.  CT scan of the head and neck have shown nothing acute.  She does have on x-ray impacted right subcapital femoral neck fracture.. S/P hip pinning.     PT Comments    Pt continues to be limited secondary to fatigue and requiring cuing for pursed lip breathing and rest breaks to maintain O2 >90%; RN aware. Pt requiring today max A with supine>sit for trunk management to assume upright position and for assistance with RLE management. Pt able to assist scooting forward and backward sitting edge of bed and in chair today, requiring min A initially with forward scooting though increasing independence with trials, and independently scooting backward seated in chair; cuing provided for weight shifting and sequencing. Pt performs STS with mod A requiring extra time and cuing for hand and foot placement, demonstrating minor posterior loss of balance once standing requiring min A to correct. Pt able to perform pivot transfer from bed to chair requiring min A for balance and RW management and cuing for sequencing. O2 sats monitored throughout, encountering patient supine in bed at 86% though increasing to 89% with cuing for pursed lip breathing; O2 sats increasing to 92% once sitting upright and maintaining once standing; however again decreasing once returned to sitting; made RN aware. (Pt did not use O2 prior to admission and 2L O2 discontinued previous day 10/4.)  Follow Up Recommendations  SNF     Equipment Recommendations  Rolling walker  with 5" wheels    Recommendations for Other Services       Precautions / Restrictions Precautions Precautions: Fall Restrictions Weight Bearing Restrictions: Yes RLE Weight Bearing: Weight bearing as tolerated    Mobility  Bed Mobility Overal bed mobility: Needs Assistance Bed Mobility: Supine to Sit     Supine to sit: Max assist;HOB elevated(max A for trunk management to assume upright position and assistance with RLE positioning)     General bed mobility comments: Mod assist initially to scoot edge of bed, though improving to min A; able to scoot backward independently once in chair  Transfers Overall transfer level: Needs assistance Equipment used: Rolling walker (2 wheeled) Transfers: Sit to/from [facility] Sit to Stand: Mod assist(requiring encouragement and extra time to assume upright/increase hip and knee extension; mild posterior loss of balance once standing requiring min A) Stand pivot transfers: Min assist(min A for balance and cuing for RW management and foot placement)          Ambulation/Gait             General Gait Details: Deferred gait today secondary to fatigue and O2 sat; Rn aware of O2 sat throughout session   Stairs             Wheelchair Mobility    Modified Rankin (Stroke Patients Only)       Balance Overall balance assessment: Needs assistance Sitting-balance support: Feet supported Sitting balance-Leahy Scale: Good     Standing balance support: Bilateral upper extremity supported Standing balance-Leahy Scale: Poor Standing balance comment: Minor posterior  LOB once initially standing requiring min A                            Cognition Arousal/Alertness: Awake/alert Behavior During Therapy: WFL for tasks assessed/performed Overall Cognitive Status: Within Functional Limits for tasks assessed                                 General Comments: Mildly lethargic today, closing  eyes at times during rest breaks stating she is tired.      Exercises Total Joint Exercises Ankle Circles/Pumps: AROM;Both;20 reps;Supine Quad Sets: Strengthening;Right;10 reps;Supine Heel Slides: AAROM;Right;10 reps;Supine Hip ABduction/ADduction: AAROM;Right;10 reps    General Comments        Pertinent Vitals/Pain Pain Assessment: 0-10 Pain Score: 3  Pain Location: R hip and thigh Pain Descriptors / Indicators: Aching;Discomfort Pain Intervention(s): Limited activity within patient's tolerance;Monitored during session;Repositioned    Home Living Family/patient expects to be discharged to:: Skilled nursing facility Living Arrangements: Spouse/significant other                  Prior Function Level of Independence: Independent          PT Goals (current goals can now be found in the care plan section) Acute Rehab PT Goals Patient Stated Goal: to walk PT Goal Formulation: With patient Time For Goal Achievement: 01/22/19 Potential to Achieve Goals: Good Progress towards PT goals: Progressing toward goals    Frequency    BID      PT Plan Current plan remains appropriate    Co-evaluation              AM-PAC PT "6 Clicks" Mobility   Outcome Measure  Help needed turning from your back to your side while in a flat bed without using bedrails?: A Lot Help needed moving from lying on your back to sitting on the side of a flat bed without using bedrails?: A Lot Help needed moving to and from a bed to a chair (including a wheelchair)?: A Little   Help needed to walk in hospital room?: A Lot Help needed climbing 3-5 steps with a railing? : Total 6 Click Score: 10    End of Session Equipment Utilized During Treatment: Gait belt Activity Tolerance: Patient limited by fatigue(Cuing for pursed lip breathing to maintain O2 >90%) Patient left: in chair;with chair alarm set;with call bell/phone within reach;with SCD's reapplied Nurse Communication: Mobility  status;Other (comment)(O2 sats throughout session) PT Visit Diagnosis: Unsteadiness on feet (R26.81);Difficulty in walking, not elsewhere classified (R26.2);Muscle weakness (generalized) (M62.81);History of falling (Z91.81) Pain - Right/Left: Right Pain - part of body: Hip     Time: [id number] PT Time Calculation (min) (ACUTE ONLY): 38 min  Charges:  $Therapeutic Exercise: 8-22 mins $Therapeutic Activity: 23-37 mins                     Petra Kuba, PT, DPT 01/09/19, 11:35 AM

## 2019-01-09 NOTE — Progress Notes (Signed)
[facility] [place] NAME: Diana Frost    MR#:  [id number]  DATE OF BIRTH:  11-28-1945  SUBJECTIVE:  CHIEF COMPLAINT:   Chief Complaint  Patient presents with   Fall   -Patient came in after a fall and right femoral neck fracture.  Status post surgery,  -Postop day 2 today, continues to work with therapy  REVIEW OF SYSTEMS:  Review of Systems  Constitutional: Positive for malaise/fatigue. Negative for chills and fever.  HENT: Negative for congestion, ear discharge, hearing loss and nosebleeds.   Eyes: Negative for blurred vision, double vision and photophobia.  Respiratory: Negative for cough, shortness of breath and wheezing.   Cardiovascular: Negative for chest pain and palpitations.  Gastrointestinal: Negative for abdominal pain, constipation, diarrhea, nausea and vomiting.  Genitourinary: Negative for dysuria.  Musculoskeletal: Positive for joint pain and myalgias.  Neurological: Positive for weakness. Negative for dizziness, speech change, focal weakness, seizures and headaches.  Psychiatric/Behavioral: Negative for depression.    DRUG ALLERGIES:   Allergies  Allergen Reactions   Percocet [Oxycodone-Acetaminophen] Nausea And Vomiting   Statins     Joint pain   Sulfa Antibiotics Nausea And Vomiting    VITALS:  Blood pressure 135/71, pulse (!) 107, temperature 98.8 F (37.1 C), temperature source Oral, resp. rate 18, height 5\' 2"  (1.575 m), weight 52 kg, SpO2 (!) 86 %.  PHYSICAL EXAMINATION:  Physical Exam   GENERAL:  73 y.o.-year-old patient lying in the bed with no acute distress.  EYES: Pupils equal, round, reactive to light and accommodation. No scleral icterus. Extraocular muscles intact.  Bruising around the right eye HEENT: Head atraumatic, normocephalic. Oropharynx and nasopharynx clear.  NECK:  Supple, no jugular venous distention. No thyroid enlargement, no tenderness.  LUNGS: Normal breath sounds  bilaterally, no wheezing, rales,rhonchi or crepitation. No use of accessory muscles of respiration.  CARDIOVASCULAR: S1, S2 normal. No  rubs, or gallops.  2/6 systolic murmur is present ABDOMEN: Soft, nontender, nondistended. Bowel sounds present. No organomegaly or mass.  EXTREMITIES: No pedal edema, cyanosis, or clubbing.  Right hip dressing in place.  No bleeding noted NEUROLOGIC: Cranial nerves II through XII are intact. Muscle strength equal in all extremities except right leg due to pain. Sensation intact. Gait not checked.  PSYCHIATRIC: The patient is alert and oriented x 3.  SKIN: No obvious rash, lesion, or ulcer.    LABORATORY PANEL:   CBC Recent Labs  Lab 01/09/19 0603  WBC 10.0  HGB 10.1*  HCT 31.2*  PLT 166   ------------------------------------------------------------------------------------------------------------------  Chemistries  Recent Labs  Lab 01/09/19 0603  NA 137  K 4.0  CL 105  CO2 21*  GLUCOSE 200*  BUN 15  CREATININE 0.65  CALCIUM 8.3*   ------------------------------------------------------------------------------------------------------------------  Cardiac Enzymes No results for input(s): TROPONINI in the last 168 hours. ------------------------------------------------------------------------------------------------------------------  RADIOLOGY:  Dg Hip Operative Unilat W Or W/o Pelvis Right  Result Date: 01/07/2019 CLINICAL DATA:  73 year old female with history of right hip fracture status post ORIF. EXAM: OPERATIVE RIGHT HIP (WITH PELVIS IF PERFORMED) 2 VIEWS TECHNIQUE: Fluoroscopic spot image(s) were submitted for interpretation post-operatively. COMPARISON:  01/07/2019. FINDINGS: Three cannulated fixation screws traverse the previously demonstrated subcapital femoral neck fracture. Anatomic alignment has been preserved. Femoral head remains located in the acetabulum. No acute findings. IMPRESSION: Intraoperative documentation of ORIF for  right subcapital femoral neck fracture, as above. Electronically Signed   By: Vinnie Langton M.D.   On: 01/07/2019 18:28  EKG:   Orders placed or performed during the hospital encounter of 01/07/19   EKG 12-Lead   EKG 12-Lead    ASSESSMENT AND PLAN:   73 year old female with past medical history significant for type 1 diabetes mellitus on insulin pump, CAD, hypertension presents to hospital secondary to mechanical fall and right femoral neck fracture.  1.  Right subcapital femoral neck fracture-status post surgery, postop day 2 today -Appreciate orthopedics consult -Continue to monitor hemoglobin.  Pain control -Physical therapy recommended rehab.  However patient very interested in going home.  Will need a lot of support  2.  Diabetes mellitus-appreciate diabetes coordinator input.  Patient has type 1 diabetes mellitus.  She is started back on her home insulin pump -She also boluses short acting insulin on her pump.  3.  CAD-stable.  Plavix is on hold for surgery  4.  DVT prophylaxis- on Lovenox   Continue to work with physical therapy.  Anticipate discharge tomorrow   All the records are reviewed and case discussed with Care Management/Social Workerr. Management plans discussed with the patient, family and they are in agreement.  CODE STATUS: DNR  TOTAL TIME TAKING CARE OF THIS PATIENT: 38 minutes.   POSSIBLE D/C IN 1-2 DAYS, DEPENDING ON CLINICAL CONDITION.   Gladstone Lighter M.D on 01/09/2019 at 1:39 PM  Between 7am to 6pm - Pager - [phone]  After 6pm go to [email or web address] - password [email or web address] [facility]  Office  [phone]  CC: Primary care physician; Valera Castle, MD

## 2019-01-09 NOTE — Progress Notes (Signed)
Physical Therapy Treatment Patient Details Name: Diana Frost MRN: [id number] DOB: 12-Jun-1945 Today's Date: 01/09/2019    History of Present Illness Diana Frost  is a 73 y.o. female with a known history of CAD, diabetes mellitus on insulin pump presents to the emergency room after having a mechanical fall.  Patient was walking out to pick the newspaper and tripped on a ramp.  Landing on the right hip.  She does have bruising and laceration above her right eye.  CT scan of the head and neck have shown nothing acute.  She does have on x-ray impacted right subcapital femoral neck fracture.. S/P hip pinning.     PT Comments    Pt tolerates session well requiring decreased assistance with bed mobility, O2 sats remaining >95% with mobility on room air, and able to initiate gait today. Pt requests to use the bathroom upon entering pt's room; pt able to perform sit>stand and stand pivot transfer from chair to bedside commode with min A for balance and cuing for sequencing and RW management and hand placement during STS. Pt able to ambulate with RW 40' requiring min A for balance and cuing for RW management/maintaining closer proximity, and to increase L step length with pt able to demo improvement; O2 sats remaining >95% throughout. Pt performs sit>supine requiring min A for RLE management. Vitals re-assessed once pt returned to bed with O2 initially at 88%, increasing to 92% with brief rest. Made RN aware of mobility status and O2 saturation throughout session.   Follow Up Recommendations  SNF     Equipment Recommendations  Rolling walker with 5" wheels    Recommendations for Other Services       Precautions / Restrictions Precautions Precautions: Fall Restrictions Weight Bearing Restrictions: Yes RLE Weight Bearing: Weight bearing as tolerated    Mobility  Bed Mobility Overal bed mobility: Needs Assistance Bed Mobility: Sit to Supine       Sit to supine: Min assist(assistance  for RLE management; improved trunk management)      Transfers Overall transfer level: Needs assistance Equipment used: Rolling walker (2 wheeled)   Sit to Stand: Min assist Stand pivot transfers: Min assist(for balance and RW management)          Ambulation/Gait Ambulation/Gait assistance: Min assist Gait Distance (Feet): 40 Feet Assistive device: Rolling walker (2 wheeled) Gait Pattern/deviations: Step-to pattern;Step-through pattern;Decreased step length - left;Decreased stance time - right     General Gait Details: Able to improve L step length with cuing. O2 sats >95% throughout.   Stairs             Wheelchair Mobility    Modified Rankin (Stroke Patients Only)       Balance Overall balance assessment: Needs assistance Sitting-balance support: Feet supported;No upper extremity supported Sitting balance-Leahy Scale: Good     Standing balance support: Bilateral upper extremity supported Standing balance-Leahy Scale: Fair                              Cognition Arousal/Alertness: Awake/alert Behavior During Therapy: WFL for tasks assessed/performed Overall Cognitive Status: Within Functional Limits for tasks assessed                                        Exercises      General Comments        Pertinent  Vitals/Pain Pain Assessment: 0-10 Pain Score: 3  Pain Location: R hip and thigh Pain Descriptors / Indicators: Aching;Discomfort Pain Intervention(s): Limited activity within patient's tolerance;Monitored during session;Repositioned;Patient requesting pain meds-RN notified    Home Living                      Prior Function            PT Goals (current goals can now be found in the care plan section) Acute Rehab PT Goals Patient Stated Goal: to walk PT Goal Formulation: With patient Time For Goal Achievement: 01/22/19 Potential to Achieve Goals: Good Progress towards PT goals: Progressing toward  goals    Frequency    BID      PT Plan Current plan remains appropriate    Co-evaluation              AM-PAC PT "6 Clicks" Mobility   Outcome Measure  Help needed turning from your back to your side while in a flat bed without using bedrails?: A Little Help needed moving from lying on your back to sitting on the side of a flat bed without using bedrails?: A Little Help needed moving to and from a bed to a chair (including a wheelchair)?: A Little Help needed standing up from a chair using your arms (e.g., wheelchair or bedside chair)?: A Little Help needed to walk in hospital room?: A Little Help needed climbing 3-5 steps with a railing? : A Lot 6 Click Score: 17    End of Session Equipment Utilized During Treatment: Gait belt Activity Tolerance: Patient tolerated treatment well;No increased pain Patient left: in bed;with call bell/phone within reach;with bed alarm set;with SCD's reapplied Nurse Communication: Mobility status PT Visit Diagnosis: Unsteadiness on feet (R26.81);Difficulty in walking, not elsewhere classified (R26.2);Muscle weakness (generalized) (M62.81);History of falling (Z91.81) Pain - Right/Left: Right Pain - part of body: Hip     Time: [id number] PT Time Calculation (min) (ACUTE ONLY): 42 min  Charges:  $Gait Training: 8-22 mins $Therapeutic Activity: 23-37 mins                    Petra Kuba, PT, DPT 01/09/19, 4:32 PM

## 2019-01-09 NOTE — Progress Notes (Signed)
  Subjective: 2 Days Post-Op Procedure(s) (LRB): CANNULATED HIP PINNING (Right) Patient reports pain as mild.   Patient is well, and has had no acute complaints or problems PT is recommending SNF at this time, however patient is determined to go home, was unable to stand for no longer than 2 minutes yesterday. Negative for chest pain and shortness of breath Fever: no Gastrointestinal: Negative for nausea and vomiting this morning.  Objective: Vital signs in last 24 hours: Temp:  [98.3 F (36.8 C)-98.8 F (37.1 C)] 98.8 F (37.1 C) (10/05 0728) Pulse Rate:  [85-106] 106 (10/05 0728) Resp:  [18-20] 18 (10/05 0008) BP: (95-135)/(52-79) 135/71 (10/05 0728) SpO2:  [93 %-96 %] 93 % (10/05 0728) Weight:  [52 kg] 52 kg (10/05 0430)  Intake/Output from previous day:  Intake/Output Summary (Last 24 hours) at 01/09/2019 0746 Last data filed at 01/08/2019 1500 Gross per 24 hour  Intake 796.32 ml  Output 460 ml  Net 336.32 ml    Intake/Output this shift: No intake/output data recorded.  Labs: Recent Labs    01/07/19 0933 01/08/19 0513 01/09/19 0603  HGB 13.5 9.5* 10.1*   Recent Labs    01/08/19 0513 01/09/19 0603  WBC 7.3 10.0  RBC 3.00* 3.27*  HCT 29.1* 31.2*  PLT 155 166   Recent Labs    01/08/19 0513 01/09/19 0603  NA 136 137  K 4.6 4.0  CL 105 105  CO2 23 21*  BUN 20 15  CREATININE 0.78 0.65  GLUCOSE 250* 200*  CALCIUM 8.0* 8.3*   Recent Labs    01/07/19 1450  INR 1.0     EXAM General - Patient is Alert and Oriented Extremity - Neurovascular intact Sensation intact distally Dorsiflexion/Plantar flexion intact Incision: dressing C/D/I Compartment soft Dressing/Incision - clean, dry, no drainage Motor Function - intact, moving foot and toes well on exam.   Past Medical History:  Diagnosis Date  . Coronary artery disease   . Diabetes mellitus without complication (HCC)    insulin pump  . Headache    since ear problem started  . Hypertension    . Myocardial infarction ([place]) 11/01/13    Assessment/Plan: 2 Days Post-Op Procedure(s) (LRB): CANNULATED HIP PINNING (Right) Active Problems:   Closed right hip fracture, initial encounter ([place]) Anemia due to acute blood loss.  Estimated body mass index is 20.97 kg/m as calculated from the following:   Height as of this encounter: 5\' 2"  (1.575 m).   Weight as of this encounter: 52 kg. Advance diet Up with therapy D/C IV fluids   Labs reviewed this AM, Hg 10.1. Continue to work on BM. Patient would like to return home, will continue to work with PT today.  Upon d/c, continue Lovenox 40mg  daily for 14 days. Follow-up with [facility] in 14 days for staple removal and x-rays.  DVT Prophylaxis - Lovenox, Foot Pumps and TED hose Weight-Bearing as tolerated to right leg  J. Cameron Proud, PA-C Orthopaedic Surgery 01/09/2019, 7:46 AM

## 2019-01-09 NOTE — Progress Notes (Addendum)
Inpatient Diabetes Program Recommendations  AACE/ADA: New Consensus Statement on Inpatient Glycemic Control (2015)  Target Ranges:  Prepandial:   less than 140 mg/dL      Peak postprandial:   less than 180 mg/dL (1-2 hours)      Critically ill patients:  140 - 180 mg/dL   Results for RHAYA, COALE (MRN [id number]) as of 01/09/2019 08:06  Ref. Range 01/08/2019 00:41 01/08/2019 04:14 01/08/2019 08:04 01/08/2019 12:25 01/08/2019 16:02 01/08/2019 20:13  Glucose-Capillary Latest Ref Range: 70 - 99 mg/dL 209 (H) 200 (H) 192 (H)  2 units NOVOLOG  262 (H) 160 (H) 130 (H)   Results for ZAFIRA, MUNOS (MRN [id number]) as of 01/09/2019 08:06  Ref. Range 01/09/2019 00:07 01/09/2019 04:15 01/09/2019 07:29  Glucose-Capillary Latest Ref Range: 70 - 99 mg/dL 125 (H) 215 (H) 162 (H)   Results for LUJAIN, KRASZEWSKI (MRN [id number]) as of 01/09/2019 08:06  Ref. Range 01/07/2019 15:21  Hemoglobin A1C Latest Ref Range: 4.8 - 5.6 % 8.8 (H)  (205 mg/dl)    Admit with: Fall with nondisplaced impacted right femoral neck fracture  History: Type 1 Diabetes since age 43  Home DM Meds: Insulin Pump (takes Lantus 13 units Daily if pump not working)  Current Orders: Cabin crew SSI (0-9 units) Q4 hours     Home Insulin Pump   Endocrinologist: Dr. Malen Gauze with [facility]--last seen by telemedicine visit on 09/08/220    MD- Please place orders for the Insulin Pump Orders to allow pt to use her insulin pump while in hospital.  Patient currently has pump on and is using per [facility] and nursing.  Please d/c orders for Novolog Sensitive Correction Scale/ SSI (0-9 units) Q4 hours since patient using home insulin pump    Addendum 11:45am- Met with pt today.  Pt A&O and able to independently manage her home insulin pump.  Per pt she resumed her home insulin pump with all new supplies around 12pm on 10/04.  Not due for set/site change until 10/07.  Pt is communicating with the RNs how much insulin  she is bolusing herself with.  RNs checking CBGs with hospital meter.  Pt has access to new pump supplies if needed.  Can contact family to bring more supplies if needed.  CBGs stable.  Pt told me she has had no changes to her insulin pump settings since her visit with the ENDO on 12/13/2018.  Discussed all the above the RN today.   Insulin Pump Settings: Basal Rates: 12am- 0.1 units/hr 4am- 0.45 units/hr 9am- 0.8 units/hr 12pm- 0.8 units/hr 9pm- 0.3 units/hr Total Basal per 24 hour period= 13.15 units  ICR (Insulin to Carb Ratio): 12am- 1:11 12pm- 1:15 5pm- 1:16  ISF (Correction Factor): 12am- 1:75 5am- 1:60 8pm- 1:75   --Will follow patient during hospitalization--  Wyn Quaker RN, MSN, CDE Diabetes Coordinator Inpatient Glycemic Control Team Team Pager: [phone] (8a-5p)

## 2019-01-10 LAB — GLUCOSE, CAPILLARY
Glucose-Capillary: 183 mg/dL — ABNORMAL HIGH (ref 70–99)
Glucose-Capillary: 202 mg/dL — ABNORMAL HIGH (ref 70–99)
Glucose-Capillary: 218 mg/dL — ABNORMAL HIGH (ref 70–99)

## 2019-01-10 MED ORDER — TRAMADOL HCL 50 MG PO TABS: 50.0000 mg | ORAL_TABLET | Freq: Four times a day (QID) | ORAL | 0 refills | Status: DC | PRN

## 2019-01-10 MED ORDER — DOCUSATE SODIUM 100 MG PO CAPS: 100.0000 mg | ORAL_CAPSULE | Freq: Two times a day (BID) | ORAL | 0 refills | Status: DC

## 2019-01-10 MED ORDER — HYDROCODONE-ACETAMINOPHEN 5-325 MG PO TABS: 1.0000 | ORAL_TABLET | ORAL | 0 refills | Status: DC | PRN

## 2019-01-10 MED ORDER — ACETAMINOPHEN 325 MG PO TABS: 650.0000 mg | ORAL_TABLET | Freq: Four times a day (QID) | ORAL | Status: DC | PRN

## 2019-01-10 NOTE — Progress Notes (Signed)
Physical Therapy Treatment Patient Details Name: Diana Frost MRN: [id number] DOB: 07/15/1945 Today's Date: 01/10/2019    History of Present Illness Diana Frost  is a 73 y.o. female with a known history of CAD, diabetes mellitus on insulin pump presents to the emergency room after having a mechanical fall.  Patient was walking out to pick the newspaper and tripped on a ramp.  Landing on the right hip.  She does have bruising and laceration above her right eye.  CT scan of the head and neck have shown nothing acute.  She does have on x-ray impacted right subcapital femoral neck fracture.. S/P hip pinning.     PT Comments    Progressive increase in gait distance, cga/min assist with RW (75').  Step to progressing to step through gait pattern; fair WBing and stance time L LE.  Pain well-controlled; great effort and motivation to participate/progress with skilled therapy.  Very talkative throughout session; min cuing for redirection to session and task at hand.    Follow Up Recommendations  SNF     Equipment Recommendations  Rolling walker with 5" wheels    Recommendations for Other Services       Precautions / Restrictions Precautions Precautions: Fall Restrictions Weight Bearing Restrictions: Yes RLE Weight Bearing: Weight bearing as tolerated    Mobility  Bed Mobility Overal bed mobility: Needs Assistance Bed Mobility: Supine to Sit     Supine to sit: Min assist;Mod assist        Transfers Overall transfer level: Needs assistance Equipment used: Rolling walker (2 wheeled) Transfers: Sit to/from Stand Sit to Stand: Min assist         General transfer comment: maintains R LE anterior to BOS for decreased WBing/active use  Ambulation/Gait Ambulation/Gait assistance: Min guard;Min assist Gait Distance (Feet): 75 Feet Assistive device: Rolling walker (2 wheeled)       General Gait Details: step to gait progressing to step through gait pattern with  decreased stance time/WBing L LE; slow and guarded, but no overt buckling or LOB.   Stairs             Wheelchair Mobility    Modified Rankin (Stroke Patients Only)       Balance Overall balance assessment: Needs assistance Sitting-balance support: No upper extremity supported;Feet supported Sitting balance-Leahy Scale: Good     Standing balance support: Bilateral upper extremity supported Standing balance-Leahy Scale: Fair Standing balance comment: requires UE support to maintain                            Cognition Arousal/Alertness: Awake/alert Behavior During Therapy: WFL for tasks assessed/performed Overall Cognitive Status: Within Functional Limits for tasks assessed                                        Exercises Other Exercises Other Exercises: Sit/stand with RW from various surfaces (bed, recliner), cga/min assist    General Comments        Pertinent Vitals/Pain Pain Assessment: 0-10 Pain Score: 5  Pain Location: R hip Pain Descriptors / Indicators: Aching;Guarding;Grimacing Pain Intervention(s): Limited activity within patient's tolerance;Monitored during session;Repositioned    Home Living                      Prior Function  PT Goals (current goals can now be found in the care plan section) Acute Rehab PT Goals Patient Stated Goal: to walk PT Goal Formulation: With patient Time For Goal Achievement: 01/22/19 Potential to Achieve Goals: Good Progress towards PT goals: Progressing toward goals    Frequency    BID      PT Plan Current plan remains appropriate    Co-evaluation              AM-PAC PT "6 Clicks" Mobility   Outcome Measure  Help needed turning from your back to your side while in a flat bed without using bedrails?: A Little Help needed moving from lying on your back to sitting on the side of a flat bed without using bedrails?: A Little Help needed moving to and  from a bed to a chair (including a wheelchair)?: A Little Help needed standing up from a chair using your arms (e.g., wheelchair or bedside chair)?: A Little Help needed to walk in hospital room?: A Little Help needed climbing 3-5 steps with a railing? : A Lot 6 Click Score: 17    End of Session Equipment Utilized During Treatment: Gait belt Activity Tolerance: Patient tolerated treatment well;No increased pain Patient left: in chair;with call bell/phone within reach;with chair alarm set Nurse Communication: Mobility status PT Visit Diagnosis: Unsteadiness on feet (R26.81);Difficulty in walking, not elsewhere classified (R26.2);Muscle weakness (generalized) (M62.81);History of falling (Z91.81) Pain - Right/Left: Right Pain - part of body: Hip     Time: [id number] PT Time Calculation (min) (ACUTE ONLY): 28 min  Charges:  $Gait Training: 8-22 mins $Therapeutic Activity: 8-22 mins                     Gaines Cartmell H. Owens Shark, PT, DPT, NCS 01/10/19, 2:58 PM [phone]

## 2019-01-10 NOTE — Care Management Important Message (Signed)
Important Message  Patient Details  Name: MANIE SCOTTO MRN: [id number] Date of Birth: 1945/12/02   Medicare Important Message Given:  Yes     [name] A Eithen Castiglia 01/10/2019, 10:47 AM

## 2019-01-10 NOTE — Progress Notes (Signed)
Report given to [place] at [facility]

## 2019-01-10 NOTE — TOC Transition Note (Signed)
Transition of Care [facility]) - CM/SW Discharge Note   Patient Details  Name: RUTH SLUSHER MRN: [id number] Date of Birth: [birth year]  [facility]) CM/SW Contact:  Su Hilt, RN Phone Number: 01/10/2019, 8:46 AM   Clinical Narrative:    Patient to DC to [facility] room 513 after she has a BM today,  She will transport via EMS  The bedside nurse to call report to [phone] and then call EMS for transport when ready, Her daughter [name] has been notified,   Final next level of care: Skilled Nursing Facility Barriers to Discharge: Barriers Resolved   Patient Goals and CMS Choice   CMS Medicare.gov Compare Post Acute Care list provided to:: Patient Choice offered to / list presented to : Patient, Adult Children  Discharge Placement                       Discharge Plan and Services     Post Acute Care Choice: Durable Medical Equipment, Home Health                               Social Determinants of Health (SDOH) Interventions     Readmission Risk Interventions No flowsheet data found.

## 2019-01-10 NOTE — Discharge Summary (Signed)
[place] at [place] NAME: Diana Frost    MR#:  [id number]  DATE OF BIRTH:  [birth year]  DATE OF ADMISSION:  01/07/2019   ADMITTING PHYSICIAN: Hillary Bow, MD  DATE OF DISCHARGE:  01/10/19  PRIMARY CARE PHYSICIAN: Valera Castle, MD   ADMISSION DIAGNOSIS:   Hip fracture, right ([place]) [S72.001A] Closed fracture of neck of right femur, initial encounter ([place]) [S72.001A] Laceration of right eyebrow, initial encounter [S01.111A]  DISCHARGE DIAGNOSIS:   Active Problems:   Closed right hip fracture, initial encounter ([place])   SECONDARY DIAGNOSIS:   Past Medical History:  Diagnosis Date  . Coronary artery disease   . Diabetes mellitus without complication (HCC)    insulin pump  . Headache    since ear problem started  . Hypertension   . Myocardial infarction [facility]) 11/01/13    HOSPITAL COURSE:   73 year old female with past medical history significant for type 1 diabetes mellitus on insulin pump, CAD, hypertension presents to hospital secondary to mechanical fall and right femoral neck fracture.  1.  Right subcapital femoral neck fracture-status post surgery, postop day 3 today -Appreciate orthopedics consult -Continue to monitor hemoglobin.  Mild drop noted after surgery, however steady at 10.1.  Did not receive any transfusion this admission. -Continue Pain control -Physical therapy recommended rehab.    Plan to discharge to rehab today  2.  Diabetes mellitus-appreciate diabetes coordinator input.  Patient has type 1 diabetes mellitus.  She is started back on her home insulin pump -She also boluses short acting insulin on her pump.  That gives her a basal of 13 units and also on NovoLog with meals.  3.  CAD-stable.     4.  Depression-on Prozac  Continue to work with physical therapy.    Plan to discharge to [facility] today   DISCHARGE CONDITIONS:   Guarded  CONSULTS OBTAINED:   Treatment Team:   Corky Mull, MD  DRUG ALLERGIES:   Allergies  Allergen Reactions  . Percocet [Oxycodone-Acetaminophen] Nausea And Vomiting  . Statins     Joint pain  . Sulfa Antibiotics Nausea And Vomiting   DISCHARGE MEDICATIONS:   Allergies as of 01/10/2019      Reactions   Percocet [oxycodone-acetaminophen] Nausea And Vomiting   Statins    Joint pain   Sulfa Antibiotics Nausea And Vomiting      Medication List    STOP taking these medications   clopidogrel 75 MG tablet Commonly known as: PLAVIX   ondansetron 4 MG disintegrating tablet Commonly known as: ZOFRAN-ODT   pantoprazole 40 MG tablet Commonly known as: Protonix     TAKE these medications   acetaminophen 325 MG tablet Commonly known as: TYLENOL Take 2 tablets (650 mg total) by mouth every 6 (six) hours as needed for mild pain, fever or headache (pain score 1-3 or temp > 100.5).   aspirin 81 MG tablet Take 81 mg by mouth daily.   docusate sodium 100 MG capsule Commonly known as: COLACE Take 1 capsule (100 mg total) by mouth 2 (two) times daily.   FLUoxetine 40 MG capsule Commonly known as: PROZAC Take 40 mg by mouth every evening.   GLUCAGON EMERGENCY IJ Inject as directed as needed (for low blood sugar).   HYDROcodone-acetaminophen 5-325 MG tablet Commonly known as: NORCO/VICODIN Take 1-2 tablets by mouth every 4 (four) hours as needed for severe pain (pain score 4-6).   insulin glargine 100 UNIT/ML injection Commonly known as:  LANTUS Inject 13 Units into the skin daily as needed (If pump does not lower blood sugars). As needed if insulin pump is not working   insulin lispro 100 UNIT/ML injection Commonly known as: HUMALOG Inject 0-23 Units into the skin daily. VIA PUMP   rosuvastatin 10 MG tablet Commonly known as: CRESTOR Take 10 mg by mouth at bedtime.   traMADol 50 MG tablet Commonly known as: ULTRAM Take 1 tablet (50 mg total) by mouth every 6 (six) hours as needed for moderate pain.         DISCHARGE INSTRUCTIONS:   1.  PCP follow-up in 1 to 2 weeks 2.  Orthopedic follow-up in 2 weeks  DIET:   Cardiac diet  ACTIVITY:   Activity as tolerated  OXYGEN:   Home Oxygen: No.  Oxygen Delivery: room air  DISCHARGE LOCATION:   nursing home   If you experience worsening of your admission symptoms, develop shortness of breath, life threatening emergency, suicidal or homicidal thoughts you must seek medical attention immediately by calling 911 or calling your MD immediately  if symptoms less severe.  You Must read complete instructions/literature along with all the possible adverse reactions/side effects for all the Medicines you take and that have been prescribed to you. Take any new Medicines after you have completely understood and accpet all the possible adverse reactions/side effects.   Please note  You were cared for by a hospitalist during your hospital stay. If you have any questions about your discharge medications or the care you received while you were in the hospital after you are discharged, you can call the unit and asked to speak with the hospitalist on call if the hospitalist that took care of you is not available. Once you are discharged, your primary care physician will handle any further medical issues. Please note that NO REFILLS for any discharge medications will be authorized once you are discharged, as it is imperative that you return to your primary care physician (or establish a relationship with a primary care physician if you do not have one) for your aftercare needs so that they can reassess your need for medications and monitor your lab values.    On the day of Discharge:  VITAL SIGNS:   Blood pressure 113/62, pulse 97, temperature 98 F (36.7 C), resp. rate 18, height 5\' 2"  (1.575 m), weight 64.4 kg, SpO2 95 %.  PHYSICAL EXAMINATION:   GENERAL:  73 y.o.-year-old patient lying in the bed with no acute distress.  EYES: Pupils equal, round,  reactive to light and accommodation. No scleral icterus. Extraocular muscles intact.    Ecchymosis around the right eye HEENT: Head atraumatic, normocephalic. Oropharynx and nasopharynx clear.  NECK:  Supple, no jugular venous distention. No thyroid enlargement, no tenderness.  LUNGS: Normal breath sounds bilaterally, no wheezing, rales,rhonchi or crepitation. No use of accessory muscles of respiration.  CARDIOVASCULAR: S1, S2 normal. No  rubs, or gallops.  2/6 systolic murmur is present ABDOMEN: Soft, nontender, nondistended. Bowel sounds present. No organomegaly or mass.  EXTREMITIES: No pedal edema, cyanosis, or clubbing.  Right hip dressing in place.  No bleeding noted NEUROLOGIC: Cranial nerves II through XII are intact. Muscle strength equal in all extremities except right leg due to pain. Sensation intact. Gait not checked.  PSYCHIATRIC: The patient is alert and oriented x 3.  SKIN: No obvious rash, lesion, or ulcer.   DATA REVIEW:   CBC Recent Labs  Lab 01/09/19 0603  WBC 10.0  HGB 10.1*  HCT 31.2*  PLT 166    Chemistries  Recent Labs  Lab 01/09/19 0603  NA 137  K 4.0  CL 105  CO2 21*  GLUCOSE 200*  BUN 15  CREATININE 0.65  CALCIUM 8.3*     Microbiology Results  Results for orders placed or performed during the hospital encounter of 01/07/19  SARS Coronavirus 2 [facility] order, Performed in [facility] hospital lab) Nasopharyngeal Nasopharyngeal Swab     Status: None   Collection Time: 01/07/19  1:05 PM   Specimen: Nasopharyngeal Swab  Result Value Ref Range Status   SARS Coronavirus 2 NEGATIVE NEGATIVE Final    Comment: (NOTE) If result is NEGATIVE SARS-CoV-2 target nucleic acids are NOT DETECTED. The SARS-CoV-2 RNA is generally detectable in upper and lower  respiratory specimens during the acute phase of infection. The lowest  concentration of SARS-CoV-2 viral copies this assay can detect is 250  copies / mL. A negative result does not preclude SARS-CoV-2  infection  and should not be used as the sole basis for treatment or other  patient management decisions.  A negative result may occur with  improper specimen collection / handling, submission of specimen other  than nasopharyngeal swab, presence of viral mutation(s) within the  areas targeted by this assay, and inadequate number of viral copies  (<250 copies / mL). A negative result must be combined with clinical  observations, patient history, and epidemiological information. If result is POSITIVE SARS-CoV-2 target nucleic acids are DETECTED. The SARS-CoV-2 RNA is generally detectable in upper and lower  respiratory specimens dur ing the acute phase of infection.  Positive  results are indicative of active infection with SARS-CoV-2.  Clinical  correlation with patient history and other diagnostic information is  necessary to determine patient infection status.  Positive results do  not rule out bacterial infection or co-infection with other viruses. If result is PRESUMPTIVE POSTIVE SARS-CoV-2 nucleic acids MAY BE PRESENT.   A presumptive positive result was obtained on the submitted specimen  and confirmed on repeat testing.  While 2019 novel coronavirus  (SARS-CoV-2) nucleic acids may be present in the submitted sample  additional confirmatory testing may be necessary for epidemiological  and / or clinical management purposes  to differentiate between  SARS-CoV-2 and other Sarbecovirus currently known to infect humans.  If clinically indicated additional testing with an alternate test  methodology [phone]) is advised. The SARS-CoV-2 RNA is generally  detectable in upper and lower respiratory sp ecimens during the acute  phase of infection. The expected result is Negative. Fact Sheet for Patients:  StrictlyIdeas.no Fact Sheet for Healthcare Providers: BankingDealers.co.za This test is not yet approved or cleared by the [place]  FDA and has been authorized for detection and/or diagnosis of SARS-CoV-2 by FDA under an Emergency Use Authorization (EUA).  This EUA will remain in effect (meaning this test can be used) for the duration of the COVID-19 declaration under Section 564(b)(1) of the Act, 21 U.S.C. section 360bbb-3(b)(1), unless the authorization is terminated or revoked sooner. Performed at [facility], [place], [place] [address]   SARS CORONAVIRUS 2 (TAT 6-24 HRS) Nasopharyngeal Nasopharyngeal Swab     Status: None   Collection Time: 01/09/19  4:36 PM   Specimen: Nasopharyngeal Swab  Result Value Ref Range Status   SARS Coronavirus 2 NEGATIVE NEGATIVE Final    Comment: (NOTE) SARS-CoV-2 target nucleic acids are NOT DETECTED. The SARS-CoV-2 RNA is generally detectable in upper and lower respiratory specimens during  the acute phase of infection. Negative results do not preclude SARS-CoV-2 infection, do not rule out co-infections with other pathogens, and should not be used as the sole basis for treatment or other patient management decisions. Negative results must be combined with clinical observations, patient history, and epidemiological information. The expected result is Negative. Fact Sheet for Patients: SugarRoll.be Fact Sheet for Healthcare Providers: https://www.woods-mathews.com/ This test is not yet approved or cleared by the [place] FDA and  has been authorized for detection and/or diagnosis of SARS-CoV-2 by FDA under an Emergency Use Authorization (EUA). This EUA will remain  in effect (meaning this test can be used) for the duration of the COVID-19 declaration under Section 56 4(b)(1) of the Act, 21 U.S.C. section 360bbb-3(b)(1), unless the authorization is terminated or revoked sooner. Performed at [facility], [place] [address], [place], [place] [address]     RADIOLOGY:  No results found.   Management  plans discussed with the patient, family and they are in agreement.  CODE STATUS:     Code Status Orders  (From admission, onward)         Start     Ordered   01/07/19 1248  Do not attempt resuscitation (DNR)  Continuous    Question Answer Comment  In the event of cardiac or respiratory ARREST Do not call a "code blue"   In the event of cardiac or respiratory ARREST Do not perform Intubation, CPR, defibrillation or ACLS   In the event of cardiac or respiratory ARREST Use medication by any route, position, wound care, and other measures to relive pain and suffering. May use oxygen, suction and manual treatment of airway obstruction as needed for comfort.      01/07/19 1249        Code Status History    Date Active Date Inactive Code Status Order ID Comments User Context   08/12/2016 [id number] 08/15/2016 1732 DNR [id number]  Harvie Bridge, DO Inpatient   Advance Care Planning Activity    Advance Directive Documentation     Most Recent Value  Type of Advance Directive  Healthcare Power of Attorney  Pre-existing out of facility DNR order (yellow form or pink MOST form)  -  "MOST" Form in Place?  -      TOTAL TIME TAKING CARE OF THIS PATIENT: 39 minutes.    Gladstone Lighter M.D on 01/10/2019 at 10:32 AM  Between 7am to 6pm - Pager - [phone]  After 6pm go to [email or web address] - Proofreader  [facility] [place] [facility]  Office  [phone]  CC: Primary care physician; Valera Castle, MD   Note: This dictation was prepared with Dragon dictation along with smaller phrase technology. Any transcriptional errors that result from this process are unintentional.

## 2019-01-10 NOTE — TOC Progression Note (Signed)
Transition of Care [facility]) - Progression Note    Patient Details  Name: Diana Frost MRN: [id number] Date of Birth: 09-01-1945  [facility]) CM/SW Contact  Su Hilt, RN Phone Number: 01/10/2019, 8:32 AM  Clinical Narrative:    Spoke with daughter Diana Frost at [phone] She is anticipating that the patient will dc to [facility] today after the patient has a BM She is going to take some things for the patient over to [facility] today when she goes to sign the papers.    Expected Discharge Plan: [place] Barriers to Discharge: Continued Medical Work up  Expected Discharge Plan and Services Expected Discharge Plan: [place] Choice: Durable Medical Equipment, Home Health                                         Social Determinants of Health (SDOH) Interventions    Readmission Risk Interventions No flowsheet data found.

## 2019-01-10 NOTE — Progress Notes (Signed)
Ems notified for transport and attempted to call report x1  to [place] pt going to room 513.

## 2019-04-04 ENCOUNTER — Emergency Department: Payer: Medicare Other

## 2019-04-04 ENCOUNTER — Encounter: Payer: Self-pay | Admitting: Emergency Medicine

## 2019-04-04 ENCOUNTER — Other Ambulatory Visit: Payer: Self-pay

## 2019-04-04 ENCOUNTER — Emergency Department
Admission: EM | Admit: 2019-04-04 | Discharge: 2019-04-04 | Disposition: A | Discharge status: Home / Self Care | Payer: Medicare Other | Attending: Emergency Medicine | Admitting: Emergency Medicine

## 2019-04-04 DIAGNOSIS — S0990XA Unspecified injury of head, initial encounter: Secondary | ICD-10-CM | POA: Diagnosis present

## 2019-04-04 DIAGNOSIS — I2510 Atherosclerotic heart disease of native coronary artery without angina pectoris: Secondary | ICD-10-CM | POA: Insufficient documentation

## 2019-04-04 DIAGNOSIS — I10 Essential (primary) hypertension: Secondary | ICD-10-CM | POA: Insufficient documentation

## 2019-04-04 DIAGNOSIS — Z87891 Personal history of nicotine dependence: Secondary | ICD-10-CM | POA: Diagnosis not present

## 2019-04-04 DIAGNOSIS — Z7982 Long term (current) use of aspirin: Secondary | ICD-10-CM | POA: Insufficient documentation

## 2019-04-04 DIAGNOSIS — Y929 Unspecified place or not applicable: Secondary | ICD-10-CM | POA: Diagnosis not present

## 2019-04-04 DIAGNOSIS — W010XXA Fall on same level from slipping, tripping and stumbling without subsequent striking against object, initial encounter: Secondary | ICD-10-CM | POA: Insufficient documentation

## 2019-04-04 DIAGNOSIS — E109 Type 1 diabetes mellitus without complications: Secondary | ICD-10-CM | POA: Diagnosis not present

## 2019-04-04 DIAGNOSIS — S0001XA Abrasion of scalp, initial encounter: Secondary | ICD-10-CM

## 2019-04-04 DIAGNOSIS — W19XXXA Unspecified fall, initial encounter: Secondary | ICD-10-CM

## 2019-04-04 DIAGNOSIS — Y999 Unspecified external cause status: Secondary | ICD-10-CM | POA: Insufficient documentation

## 2019-04-04 DIAGNOSIS — Z23 Encounter for immunization: Secondary | ICD-10-CM | POA: Insufficient documentation

## 2019-04-04 DIAGNOSIS — S0003XA Contusion of scalp, initial encounter: Secondary | ICD-10-CM

## 2019-04-04 DIAGNOSIS — Z79899 Other long term (current) drug therapy: Secondary | ICD-10-CM | POA: Insufficient documentation

## 2019-04-04 DIAGNOSIS — Y9389 Activity, other specified: Secondary | ICD-10-CM | POA: Diagnosis not present

## 2019-04-04 MED ORDER — TETANUS-DIPHTH-ACELL PERTUSSIS 5-2.5-18.5 LF-MCG/0.5 IM SUSP
0.5000 mL | Freq: Once | INTRAMUSCULAR | Status: AC
  Administered 2019-04-04: 12:00:00 0.5 mL via INTRAMUSCULAR
  Filled 2019-04-04: qty 0.5

## 2019-04-04 NOTE — ED Provider Notes (Signed)
[facility] Emergency Department Provider Note   ____________________________________________   First MD Initiated Contact with Patient 04/04/19 [phone]     (approximate)  I have reviewed the triage vital signs and the nursing notes.   HISTORY  Chief Complaint Fall   HPI Diana Frost is a 73 y.o. female presents to the ED via EMS after patient fell while picking up her newspaper this morning.  Patient states that she slipped either on her feet or the pavement and landed backwards.  She states that she did hit her head but denies LOC.  She currently has a bandage wrapped around her head as there was some bleeding from her scalp.  Patient denies this being a syncopal episode and after falling remove the plastic from her newspaper and the rubber band.  She states that neighbors saw her fall and called 911.  Patient does take 1 aspirin per day but no other blood thinners.  She denies any chest pain or shortness of breath at this time.  There has been no visual changes, dizziness, nausea or vomiting.  Currently she rates her pain as 6/10.     Past Medical History:  Diagnosis Date  . Coronary artery disease   . Diabetes mellitus without complication (HCC)    insulin pump  . Headache    since ear problem started  . Hypertension   . Myocardial infarction [facility]) 11/01/13    Patient Active Problem List   Diagnosis Date Noted  . Closed right hip fracture, initial encounter ([place]) 01/07/2019  . Malnutrition of moderate degree 08/14/2016  . Non-ST elevation (NSTEMI) myocardial infarction ([place]) 08/13/2016  . 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 AKI (acute kidney injury) ([place]) 08/13/2016  . Hypokalemia 08/13/2016  . DKA (diabetic ketoacidoses) ([place]) 08/12/2016  . Melanoma ([place]) 11/01/2015  . Stable angina ([place]) 10/10/2015  . Coronary artery disease involving native coronary artery of native heart 11/16/2012  . History of insertion of insulin pump 05/24/2012  .  Type 1 diabetes mellitus ([place]) 02/11/2011  . Depression 02/11/2011    Past Surgical History:  Procedure Laterality Date  . ABDOMINAL HYSTERECTOMY    . BACK SURGERY     tail bone removed after fall/fracture  . CATARACT EXTRACTION BILATERAL W/ ANTERIOR VITRECTOMY    . CORONARY ANGIOPLASTY WITH STENT PLACEMENT  11/01/13   [facility], stent x2  . HIP PINNING,CANNULATED Right 01/07/2019   Procedure: CANNULATED HIP PINNING;  Surgeon: Corky Mull, MD;  Location: [facility] ORS;  Service: Orthopedics;  Laterality: Right;  . LEFT HEART CATH AND CORONARY ANGIOGRAPHY N/A 08/14/2016   Procedure: Left Heart Cath and Coronary Angiography;  Surgeon: Minna Merritts, MD;  Location: [place] [facility];  Service: Cardiovascular;  Laterality: N/A;  . MELANOMA EXCISION Right    arm with skin graft  . MYRINGOTOMY WITH TUBE PLACEMENT Left 08/21/2015   Procedure: MYRINGOTOMY WITH TUBE PLACEMENT;  Surgeon: Carloyn Manner, MD;  Location: [place];  Service: ENT;  Laterality: Left;  . NASOPHARYNGEAL BIOPSY N/A 11/07/2015   Procedure: NASOPHARYNGEAL BIOPSY;  Surgeon: Carloyn Manner, MD;  Location: [facility] ORS;  Service: ENT;  Laterality: N/A;  . TONSILLECTOMY      Prior to Admission medications   Medication Sig Start Date End Date Taking? Authorizing Provider  acetaminophen (TYLENOL) 325 MG tablet Take 2 tablets (650 mg total) by mouth every 6 (six) hours as needed for mild pain, fever or headache (pain score 1-3 or temp > 100.5). 01/10/19   Kalisetti,  Hart [place], MD  aspirin 81 MG tablet Take 81 mg by mouth daily.    [provider]  docusate sodium (COLACE) 100 MG capsule Take 1 capsule (100 mg total) by mouth 2 (two) times daily. 01/10/19   Gladstone Lighter, MD  FLUoxetine (PROZAC) 40 MG capsule Take 40 mg by mouth every evening.     [provider]  Glucagon, rDNA, (GLUCAGON EMERGENCY IJ) Inject as directed as needed (for low blood sugar).    [provider]  insulin  glargine (LANTUS) 100 UNIT/ML injection Inject 13 Units into the skin daily as needed (If pump does not lower blood sugars). As needed if insulin pump is not working     [provider]  insulin lispro (HUMALOG) 100 UNIT/ML injection Inject 0-23 Units into the skin daily. VIA PUMP 09/29/18   [provider]  rosuvastatin (CRESTOR) 10 MG tablet Take 10 mg by mouth at bedtime. 12/05/18   [provider]    Allergies Percocet [oxycodone-acetaminophen], Statins, and Sulfa antibiotics  Family History  Problem Relation Age of Onset  . Lung cancer Father   . Breast cancer Maternal Grandfather     Social History Social History   Tobacco Use  . Smoking status: Former Smoker    Types: Cigarettes    Quit date: 04/30/1998    Years since quitting: 20.9  . Smokeless tobacco: Never Used  Substance Use Topics  . Alcohol use: Yes    Alcohol/week: 17.0 standard drinks    Types: 17 Glasses of wine per week    Comment:    . Drug use: No    Review of Systems Constitutional: No fever/chills Eyes: No visual changes. ENT: No trauma. Cardiovascular: Denies chest pain. Respiratory: Denies shortness of breath. Gastrointestinal: No abdominal pain.  No nausea, no vomiting.  Musculoskeletal: Positive for posterior scalp pain. Skin: Positive for abrasion scalp with bleeding controlled. Neurological: Positive for mild headache generalized.  No focal weakness or numbness.  ____________________________________________   PHYSICAL EXAM:  VITAL SIGNS: ED Triage Vitals  Enc Vitals Group     BP 04/04/19 1013 (!) 157/84     Pulse Rate 04/04/19 1013 87     Resp 04/04/19 1013 18     Temp 04/04/19 1013 97.8 F (36.6 C)     Temp Source 04/04/19 1013 Oral     SpO2 04/04/19 1013 97 %     Weight 04/04/19 1014 110 lb (49.9 kg)     Height 04/04/19 1014 5\' 2"  (1.575 m)     Head Circumference --      Peak Flow --      Pain Score 04/04/19 1014 6     Pain Loc --      Pain Edu? --        Excl. in [place]? --    Constitutional: Alert and oriented. Well appearing and in no acute distress.  Patient is talkative and answering questions appropriately.  She also made phone call to her daughter while in the ED. Eyes: Conjunctivae are normal. PERRL. EOMI. Head: Right posterior scalp with small hematoma and abrasion with bleeding controlled. Nose: No trauma. Neck: No stridor.  Nontender cervical spine palpation posteriorly.  No abrasions were noted. Cardiovascular: Normal rate, regular rhythm. Grossly normal heart sounds.  Good peripheral circulation. Respiratory: Normal respiratory effort.  No retractions. Lungs CTAB. Gastrointestinal: Soft and nontender. No distention. Musculoskeletal: Moves upper and lower extremities with any difficulty.  Nontender thoracic and lumbar spine palpation posteriorly.  No abrasions or ecchymosis  is noted on inspection.  Nontender pelvis to compression. Neurologic:  Normal speech and language. No gross focal neurologic deficits are appreciated. No gait instability. Skin:  Skin is warm, dry. Psychiatric: Mood and affect are normal. Speech and behavior are normal.  ____________________________________________   LABS (all labs ordered are listed, but only abnormal results are displayed)  Labs Reviewed - No data to display  RADIOLOGY   Official radiology report(s): CT Head Wo Contrast  Result Date: 04/04/2019 CLINICAL DATA:  Fall, scalp laceration EXAM: CT HEAD WITHOUT CONTRAST CT CERVICAL SPINE WITHOUT CONTRAST TECHNIQUE: Multidetector CT imaging of the head and cervical spine was performed following the standard protocol without intravenous contrast. Multiplanar CT image reconstructions of the cervical spine were also generated. COMPARISON:  CT 01/07/2019 FINDINGS: CT HEAD FINDINGS Brain: No intracranial hemorrhage. No parenchymal contusion. No midline shift or mass effect. Basilar cisterns are patent. No skull base fracture. No fluid in the  paranasal sinuses or mastoid air cells. Orbits are normal. There are periventricular and subcortical white matter hypodensities. Generalized cortical atrophy. Vascular: No hyperdense vessel or unexpected calcification. Skull: No skull fracture. Sinuses/Orbits: No acute finding. Other: Large RIGHT parietal scalp hematoma measures 20 mm in depth. No associated skull fracture CT CERVICAL SPINE FINDINGS Alignment: Normal alignment of the cervical vertebral bodies. Skull base and vertebrae: Normal craniocervical junction. No loss of vertebral body height or disc height. Normal facet articulation. No evidence of fracture. Soft tissues and spinal canal: No prevertebral soft tissue swelling. No perispinal or epidural hematoma. Disc levels: Endplate spurring and disc space narrowing from C5-C7. No acute findings Upper chest: Clear Other: None IMPRESSION: 1. No intracranial trauma. 2. Large RIGHT parietal scalp hematoma. 3. Atrophy and white matter microvascular disease. 4. No cervical spine fracture. 5. Multilevel disc osteophytic disease. Electronically Signed   By: Suzy Bouchard M.D.   On: 04/04/2019 11:55   CT Cervical Spine Wo Contrast  Result Date: 04/04/2019 CLINICAL DATA:  Fall, scalp laceration EXAM: CT HEAD WITHOUT CONTRAST CT CERVICAL SPINE WITHOUT CONTRAST TECHNIQUE: Multidetector CT imaging of the head and cervical spine was performed following the standard protocol without intravenous contrast. Multiplanar CT image reconstructions of the cervical spine were also generated. COMPARISON:  CT 01/07/2019 FINDINGS: CT HEAD FINDINGS Brain: No intracranial hemorrhage. No parenchymal contusion. No midline shift or mass effect. Basilar cisterns are patent. No skull base fracture. No fluid in the paranasal sinuses or mastoid air cells. Orbits are normal. There are periventricular and subcortical white matter hypodensities. Generalized cortical atrophy. Vascular: No hyperdense vessel or unexpected calcification.  Skull: No skull fracture. Sinuses/Orbits: No acute finding. Other: Large RIGHT parietal scalp hematoma measures 20 mm in depth. No associated skull fracture CT CERVICAL SPINE FINDINGS Alignment: Normal alignment of the cervical vertebral bodies. Skull base and vertebrae: Normal craniocervical junction. No loss of vertebral body height or disc height. Normal facet articulation. No evidence of fracture. Soft tissues and spinal canal: No prevertebral soft tissue swelling. No perispinal or epidural hematoma. Disc levels: Endplate spurring and disc space narrowing from C5-C7. No acute findings Upper chest: Clear Other: None IMPRESSION: 1. No intracranial trauma. 2. Large RIGHT parietal scalp hematoma. 3. Atrophy and white matter microvascular disease. 4. No cervical spine fracture. 5. Multilevel disc osteophytic disease. Electronically Signed   By: Suzy Bouchard M.D.   On: 04/04/2019 11:55    ____________________________________________   PROCEDURES  Procedure(s) performed (including Critical Care):  Procedures   ____________________________________________   INITIAL IMPRESSION / ASSESSMENT AND PLAN / ED  COURSE  As part of my medical decision making, I reviewed the following data within the electronic MEDICAL RECORD NUMBER Notes from prior ED visits and [place] Controlled Substance Database  73 year old female presents to the ED via EMS after falling in her driveway while retrieving her newspaper this morning.  Neighbors witnessed her fall and called 911.  Patient complains of a headache with bleeding of her scalp and some soreness to her neck.  She denies any other injuries.  She denies any visual changes, dizziness, nausea or vomiting.  Patient does take an aspirin a day.  She is unaware of her last tetanus.  Physical exam shows an alert, cooperative female with minimal bleeding from her scalp.  Area was cleaned and no foreign body or laceration was noted.  Bleeding was controlled easily with mild  pressure.  CT scan of the head and neck were negative and patient was made aware.  Patient was ambulatory while in the ED to the restroom with RN.  Patient then called daughter to pick her up.  A dressing was applied to her scalp.  Patient was given instructions on how to care for her abrasion to her scalp and that this would be sore for several days.  She will take Tylenol if needed for headache and pain.  She will follow-up with her PCP if any continued problems or return to the emergency department for any severe worsening of her symptoms or urgent concerns.  ____________________________________________   FINAL CLINICAL IMPRESSION(S) / ED DIAGNOSES  Final diagnoses:  Contusion of scalp, initial encounter  Abrasion of scalp, initial encounter  Fall in home, initial encounter     ED Discharge Orders    None       Note:  This document was prepared using Dragon voice recognition software and may include unintentional dictation errors.    Johnn Hai, PA-C 04/04/19 1537    Earleen Newport, MD 04/04/19 [phone]

## 2019-04-04 NOTE — Discharge Instructions (Signed)
Follow-up with your primary care provider if any continued problems or concerns.  Watch the area of your abrasion for any signs of infection.  Clean daily with mild soap and water.  You may use ice to reduce the swelling which will help with pain.  You may take Tylenol if needed for pain/headache.  You were given a tetanus booster today and add this to your immunizations.  Your CT head and cervical spine were negative for any acute injury.  Return to the emergency department over the holiday weekend if any urgent concerns or worsening of your symptoms.

## 2019-04-04 NOTE — ED Triage Notes (Signed)
Presents vis EMS s/p fall  States she went out to get her paper and fell backwards   Hitting   Has some soreness to neck  Hematoma and laceration noted to back of head  Denies any LOC  fsbs 147

## 2020-09-17 ENCOUNTER — Other Ambulatory Visit: Payer: Self-pay | Admitting: Gerontology

## 2020-09-17 DIAGNOSIS — Z1231 Encounter for screening mammogram for malignant neoplasm of breast: Secondary | ICD-10-CM

## 2020-12-23 ENCOUNTER — Emergency Department
Admission: EM | Admit: 2020-12-23 | Discharge: 2020-12-23 | Disposition: A | Discharge status: Home / Self Care | Payer: Medicare Other | Attending: Emergency Medicine | Admitting: Emergency Medicine

## 2020-12-23 ENCOUNTER — Other Ambulatory Visit: Payer: Self-pay

## 2020-12-23 DIAGNOSIS — E1010 Type 1 diabetes mellitus with ketoacidosis without coma: Secondary | ICD-10-CM | POA: Diagnosis not present

## 2020-12-23 DIAGNOSIS — Z87891 Personal history of nicotine dependence: Secondary | ICD-10-CM | POA: Insufficient documentation

## 2020-12-23 DIAGNOSIS — I10 Essential (primary) hypertension: Secondary | ICD-10-CM | POA: Insufficient documentation

## 2020-12-23 DIAGNOSIS — Z794 Long term (current) use of insulin: Secondary | ICD-10-CM | POA: Insufficient documentation

## 2020-12-23 DIAGNOSIS — R531 Weakness: Secondary | ICD-10-CM | POA: Insufficient documentation

## 2020-12-23 DIAGNOSIS — Z7982 Long term (current) use of aspirin: Secondary | ICD-10-CM | POA: Insufficient documentation

## 2020-12-23 DIAGNOSIS — I2510 Atherosclerotic heart disease of native coronary artery without angina pectoris: Secondary | ICD-10-CM | POA: Diagnosis not present

## 2020-12-23 DIAGNOSIS — N390 Urinary tract infection, site not specified: Secondary | ICD-10-CM

## 2020-12-23 DIAGNOSIS — E1065 Type 1 diabetes mellitus with hyperglycemia: Secondary | ICD-10-CM | POA: Diagnosis not present

## 2020-12-23 DIAGNOSIS — Z955 Presence of coronary angioplasty implant and graft: Secondary | ICD-10-CM | POA: Insufficient documentation

## 2020-12-23 DIAGNOSIS — R7309 Other abnormal glucose: Secondary | ICD-10-CM | POA: Diagnosis present

## 2020-12-23 LAB — COMPREHENSIVE METABOLIC PANEL
ALT: 13 U/L (ref 0–44)
AST: 21 U/L (ref 15–41)
Albumin: 3.9 g/dL (ref 3.5–5.0)
Alkaline Phosphatase: 64 U/L (ref 38–126)
Anion gap: 9 (ref 5–15)
BUN: 11 mg/dL (ref 8–23)
CO2: 25 mmol/L (ref 22–32)
Calcium: 8.9 mg/dL (ref 8.9–10.3)
Chloride: 102 mmol/L (ref 98–111)
Creatinine, Ser: 0.82 mg/dL (ref 0.44–1.00)
GFR, Estimated: >60 mL/min (ref >60)
Glucose, Bld: 349 mg/dL — ABNORMAL HIGH (ref 70–99)
Potassium: 4.4 mmol/L (ref 3.5–5.1)
Sodium: 136 mmol/L (ref 135–145)
Total Bilirubin: 0.8 mg/dL (ref 0.3–1.2)
Total Protein: 7 g/dL (ref 6.5–8.1)

## 2020-12-23 LAB — URINALYSIS, COMPLETE (UACMP) WITH MICROSCOPIC
Bilirubin Urine: NEGATIVE
Glucose, UA: 250 mg/dL — AB
Hgb urine dipstick: NEGATIVE
Ketones, ur: NEGATIVE mg/dL
Nitrite: POSITIVE — AB
Protein, ur: NEGATIVE mg/dL
RBC / HPF: NONE SEEN RBC/hpf (ref 0–5)
Specific Gravity, Urine: 1.01 (ref 1.005–1.030)
pH: 6.5 (ref 5.0–8.0)

## 2020-12-23 LAB — CBG MONITORING, ED
Glucose-Capillary: 387 mg/dL — ABNORMAL HIGH (ref 70–99)
Glucose-Capillary: 326 mg/dL — ABNORMAL HIGH (ref 70–99)
Glucose-Capillary: 128 mg/dL — ABNORMAL HIGH (ref 70–99)
Glucose-Capillary: 290 mg/dL — ABNORMAL HIGH (ref 70–99)
Glucose-Capillary: 35 mg/dL — CL (ref 70–99)
Glucose-Capillary: 182 mg/dL — ABNORMAL HIGH (ref 70–99)
Glucose-Capillary: 237 mg/dL — ABNORMAL HIGH (ref 70–99)

## 2020-12-23 LAB — CBC
HCT: 38 % (ref 36.0–46.0)
Hemoglobin: 12.8 g/dL (ref 12.0–15.0)
MCH: 31.8 pg (ref 26.0–34.0)
MCHC: 33.7 g/dL (ref 30.0–36.0)
MCV: 94.5 fL (ref 80.0–100.0)
Platelets: 226 10*3/uL (ref 150–400)
RBC: 4.02 MIL/uL (ref 3.87–5.11)
RDW: 13 % (ref 11.5–15.5)
WBC: 5.6 10*3/uL (ref 4.0–10.5)
nRBC: 0 % (ref 0.0–0.2)

## 2020-12-23 MED ORDER — INSULIN ASPART 100 UNIT/ML IJ SOLN: 0.0000 [IU] | Freq: Every day | INTRAMUSCULAR | Status: DC

## 2020-12-23 MED ORDER — CEPHALEXIN 500 MG PO CAPS: 500.0000 mg | ORAL_CAPSULE | Freq: Three times a day (TID) | ORAL | 0 refills | Status: AC

## 2020-12-23 MED ORDER — INSULIN ASPART 100 UNIT/ML IJ SOLN: 0.0000 [IU] | Freq: Three times a day (TID) | INTRAMUSCULAR | Status: DC

## 2020-12-23 MED ORDER — DEXTROSE 50 % IV SOLN
1.0000 | Freq: Once | INTRAVENOUS | Status: AC
  Administered 2020-12-23: 50 mL via INTRAVENOUS

## 2020-12-23 MED ORDER — INSULIN ASPART 100 UNIT/ML IJ SOLN
0.0000 [IU] | Freq: Three times a day (TID) | INTRAMUSCULAR | Status: DC
  Administered 2020-12-23: 15 [IU] via SUBCUTANEOUS
  Filled 2020-12-23: qty 1

## 2020-12-23 NOTE — ED Notes (Signed)
Patients son in law called for pick up. Patient currently on phone with diabetes coordinator.

## 2020-12-23 NOTE — ED Provider Notes (Signed)
[facility] Emergency Department Provider Note  ____________________________________________   Event Date/Time   First MD Initiated Contact with Patient 12/23/20 0559     (approximate)  I have reviewed the triage vital signs and the nursing notes.   HISTORY  Chief Complaint Hyperglycemia    HPI Diana Frost is a 75 y.o. female with history of type 1 diabetes, hypertension, CAD who presents to the emergency department with her daughter for concerns that her OmniPod insulin pump was out of insulin last night.  She states that she has insulin refills at her pharmacy, [facility] but that it did not open until 8 AM.  States she contacted the endocrinologist on-call at [facility] who recommended that she come to the emergency department.  Patient's daughter was concerned that her blood sugar was in the 300s in the waiting room and she was given subcutaneous sliding scale insulin.  Now patient states she is not feeling well and is weak, nauseated and her blood sugar was found to be in the 30s.  She has not had anything to eat or drink while in the waiting room.  No vomiting or diarrhea.  No fever or cough.  No chest pain or shortness of breath.        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   AKI (acute kidney injury) ([place]) 08/13/2016   Hypokalemia 08/13/2016   DKA (diabetic ketoacidoses) 08/12/2016   Melanoma ([place]) 11/01/2015   Stable angina ([place]) 10/10/2015   Coronary artery disease involving native coronary  artery of native heart 11/16/2012   History of insertion of insulin pump 05/24/2012   Type 1 diabetes mellitus ([place]) 02/11/2011   Depression 02/11/2011    Past Surgical History:  Procedure Laterality Date   ABDOMINAL HYSTERECTOMY     BACK SURGERY     tail bone removed after fall/fracture   CATARACT EXTRACTION BILATERAL W/ ANTERIOR VITRECTOMY     CORONARY ANGIOPLASTY WITH STENT PLACEMENT  11/01/13   [facility], stent x2   HIP PINNING,CANNULATED Right 01/07/2019   Procedure: CANNULATED HIP PINNING;  Surgeon: Corky Mull, MD;  Location: [facility] ORS;  Service: Orthopedics;  Laterality: Right;   LEFT HEART CATH AND CORONARY ANGIOGRAPHY N/A 08/14/2016   Procedure: Left Heart Cath and Coronary Angiography;  Surgeon: Minna Merritts, MD;  Location: [facility];  Service: Cardiovascular;  Laterality: N/A;   MELANOMA EXCISION Right    arm with skin graft   MYRINGOTOMY WITH TUBE PLACEMENT Left 08/21/2015   Procedure: MYRINGOTOMY WITH TUBE PLACEMENT;  Surgeon: Carloyn Manner, MD;  Location: [facility];  Service: ENT;  Laterality: Left;   NASOPHARYNGEAL BIOPSY N/A 11/07/2015   Procedure: NASOPHARYNGEAL BIOPSY;  Surgeon: Carloyn Manner, MD;  Location: [facility] ORS;  Service: ENT;  Laterality: N/A;   TONSILLECTOMY      Prior to Admission medications   Medication Sig Start Date End Date Taking? Authorizing Provider  acetaminophen (TYLENOL) 325 MG tablet Take 2 tablets (650 mg total) by mouth every 6 (six)  hours as needed for mild pain, fever or headache (pain score 1-3 or temp > 100.5). 01/10/19   Gladstone Lighter, MD  aspirin 81 MG tablet Take 81 mg by mouth daily.    [provider]  docusate sodium (COLACE) 100 MG capsule Take 1 capsule (100 mg total) by mouth 2 (two) times daily. 01/10/19   Gladstone Lighter, MD  FLUoxetine (PROZAC) 40 MG capsule Take 40 mg by mouth every evening.     [provider]  Glucagon, rDNA, (GLUCAGON EMERGENCY IJ) Inject as directed  as needed (for low blood sugar).    [provider]  insulin glargine (LANTUS) 100 UNIT/ML injection Inject 13 Units into the skin daily as needed (If pump does not lower blood sugars). As needed if insulin pump is not working     [provider]  insulin lispro (HUMALOG) 100 UNIT/ML injection Inject 0-23 Units into the skin daily. VIA PUMP 09/29/18   [provider]  rosuvastatin (CRESTOR) 10 MG tablet Take 10 mg by mouth at bedtime. 12/05/18   [provider]    Allergies Percocet [oxycodone-acetaminophen], Statins, and Sulfa antibiotics  Family History  Problem Relation Age of Onset   Lung cancer Father    Breast cancer Maternal Grandfather     Social History Social History   Tobacco Use   Smoking status: Former    Types: Cigarettes    Quit date: 04/30/1998    Years since quitting: 22.6   Smokeless tobacco: Never  Substance Use Topics   Alcohol use: Yes    Alcohol/week: 17.0 standard drinks    Types: 17 Glasses of wine per week    Comment:     Drug use: No    Review of Systems Constitutional: No fever. Eyes: No visual changes. ENT: No sore throat. Cardiovascular: Denies chest pain. Respiratory: Denies shortness of breath. Gastrointestinal: No nausea, vomiting, diarrhea. Genitourinary: Negative for dysuria. Musculoskeletal: Negative for back pain. Skin: Negative for rash. Neurological: Negative for focal weakness or numbness.  ____________________________________________   PHYSICAL EXAM:  VITAL SIGNS: ED Triage Vitals  Enc Vitals Group     BP 12/23/20 0017 (!) 156/98     Pulse Rate 12/23/20 0017 100     Resp 12/23/20 0017 18     Temp 12/23/20 0017 98.4 F (36.9 C)     Temp Source 12/23/20 0017 Oral     SpO2 12/23/20 0017 100 %     Weight 12/23/20 0018 125 lb (56.7 kg)     Height 12/23/20 0018 '5\' 2"'$  (1.575 m)     Head Circumference --      Peak Flow --      Pain Score 12/23/20 0018 0     Pain Loc --      Pain Edu? --       Excl. in [place]? --    CONSTITUTIONAL: Alert and oriented and responds appropriately to questions. Well-appearing; well-nourished, elderly, in no distress HEAD: Normocephalic EYES: Conjunctivae clear, pupils appear equal, EOM appear intact ENT: normal nose; moist mucous membranes NECK: Supple, normal ROM CARD: RRR; S1 and S2 appreciated; no murmurs, no clicks, no rubs, no gallops RESP: Normal chest excursion without splinting or tachypnea; breath sounds clear and equal bilaterally; no wheezes, no rhonchi, no rales, no hypoxia or respiratory distress, speaking full sentences ABD/GI: Normal bowel sounds; non-distended; soft, non-tender, no rebound, no guarding, no peritoneal signs, no hepatosplenomegaly BACK: The back appears normal EXT: Normal ROM in all joints; no deformity noted, no edema;  no cyanosis SKIN: Normal color for age and race; warm; no rash on exposed skin NEURO: Moves all extremities equally PSYCH: The patient's mood and manner are appropriate.  ____________________________________________   LABS (all labs ordered are listed, but only abnormal results are displayed)  Labs Reviewed  COMPREHENSIVE METABOLIC PANEL - Abnormal; Notable for the following components:      Result Value   Glucose, Bld 349 (*)    All other components within normal limits  CBG MONITORING, ED - Abnormal; Notable for the following components:   Glucose-Capillary 387 (*)    All other components within normal limits  CBG MONITORING, ED - Abnormal; Notable for the following components:   Glucose-Capillary 326 (*)    All other components within normal limits  CBG MONITORING, ED - Abnormal; Notable for the following components:   Glucose-Capillary 128 (*)    All other components within normal limits  CBG MONITORING, ED - Abnormal; Notable for the following components:   Glucose-Capillary 36 (*)    All other components within normal limits  CBG MONITORING, ED - Abnormal; Notable for the following  components:   Glucose-Capillary 35 (*)    All other components within normal limits  CBG MONITORING, ED - Abnormal; Notable for the following components:   Glucose-Capillary 290 (*)    All other components within normal limits  CBG MONITORING, ED - Abnormal; Notable for the following components:   Glucose-Capillary 182 (*)    All other components within normal limits  CBC  URINALYSIS, COMPLETE (UACMP) WITH MICROSCOPIC  CBG MONITORING, ED  CBG MONITORING, ED  CBG MONITORING, ED  CBG MONITORING, ED  CBG MONITORING, ED  CBG MONITORING, ED  CBG MONITORING, ED  CBG MONITORING, ED  CBG MONITORING, ED  CBG MONITORING, ED  CBG MONITORING, ED  CBG MONITORING, ED  CBG MONITORING, ED  CBG MONITORING, ED  CBG MONITORING, ED  CBG MONITORING, ED   ____________________________________________  EKG   ____________________________________________  RADIOLOGY I, Bronsyn Shappell, personally viewed and evaluated these images (plain radiographs) as part of my medical decision making, as well as reviewing the written report by the radiologist.  ED MD interpretation:    Official radiology report(s): No results found.  ____________________________________________   PROCEDURES  Procedure(s) performed (including Critical Care):  Procedures  CRITICAL CARE Performed by: Pryor Curia   Total critical care time: 45 minutes  Critical care time was exclusive of separately billable procedures and treating other patients.  Critical care was necessary to treat or prevent imminent or life-threatening deterioration.  Critical care was time spent personally by me on the following activities: development of treatment plan with patient and/or surrogate as well as nursing, discussions with consultants, evaluation of patient's response to treatment, examination of patient, obtaining history from patient or surrogate, ordering and performing treatments and interventions, ordering and review of  laboratory studies, ordering and review of radiographic studies, pulse oximetry and re-evaluation of patient's condition.  ____________________________________________   INITIAL IMPRESSION / ASSESSMENT AND PLAN / ED COURSE  As part of my medical decision making, I reviewed the following data within the [place] History obtained from family, Nursing notes reviewed and incorporated, Labs reviewed , Old chart reviewed, and Notes from prior ED visits         Patient here with concerns for hyperglycemia and being out of insulin.  States her on-call endocrinologist at [facility] recommended that she come to the emergency department.  Patient and daughter were hoping that we would be  able to refill her OmniPod insulin pump with insulin here in the emergency department.  Explained to patient and family that this would not be able to be done from the ER.  She received sliding scale insulin for hyperglycemia while in the [place] here but was not in DKA.  Unfortunately her sugars bottomed out into the 30s and she is now symptomatic.  She was given D50 and has been able to eat and drink.  We will monitor her blood glucose here.  She has normal renal function.  Will check urinalysis.  Patient states that she has a refill of her insulin at her pharmacy which opens today at 8 AM.  Signed out to oncoming physician to continue to monitor blood glucose but anticipate if it is stable that she will be able to be discharged home.  I reviewed all nursing notes and pertinent previous records as available.  I have reviewed and interpreted any EKGs, lab and urine results, imaging (as available).  ____________________________________________   FINAL CLINICAL IMPRESSION(S) / ED DIAGNOSES  Final diagnoses:  Abnormal blood sugar     ED Discharge Orders     None       *Please note:  Diana Frost was evaluated in Emergency Department on 12/23/2020 for the symptoms described in the history of present  illness. She was evaluated in the context of the global COVID-19 pandemic, which necessitated consideration that the patient might be at risk for infection with the SARS-CoV-2 virus that causes COVID-19. Institutional protocols and algorithms that pertain to the evaluation of patients at risk for COVID-19 are in a state of rapid change based on information released by regulatory bodies including the CDC and federal and state organizations. These policies and algorithms were followed during the patient's care in the ED.  Some ED evaluations and interventions may be delayed as a result of limited staffing during and the pandemic.*   Note:  This document was prepared using Dragon voice recognition software and may include unintentional dictation errors.    Masato Pettie, Delice Bison, DO 12/23/20 [phone]

## 2020-12-23 NOTE — ED Notes (Signed)
Repeat CBG obtained by this RN. Pt provided with warm blanket.

## 2020-12-23 NOTE — Progress Notes (Signed)
Inpatient Diabetes Program Recommendations  AACE/ADA: New Consensus Statement on Inpatient Glycemic Control (2015)  Target Ranges:  Prepandial:   less than 140 mg/dL      Peak postprandial:   less than 180 mg/dL (1-2 hours)      Critically ill patients:  140 - 180 mg/dL   Results for Diana Frost, Diana Frost (MRN [id number]) as of 12/23/2020 09:38  Ref. Range 12/23/2020 01:43 12/23/2020 02:59 12/23/2020 04:35 12/23/2020 05:51 12/23/2020 05:52 12/23/2020 06:06 12/23/2020 06:58  Glucose-Capillary Latest Ref Range: 70 - 99 mg/dL 387 (H)  Novolog 15 units '@1'$ :58 326 (H) 128 (H) 36 (LL) 35 (LL) 290 (H) 182 (H)   Results for Diana Frost, Diana Frost (MRN [id number]) as of 12/23/2020 09:38  Ref. Range 12/23/2020 00:26  Glucose Latest Ref Range: 70 - 99 mg/dL 349 (H)   Review of Glycemic Control  Diabetes history: DM1; makes NO insulin, requires basal, correction, and carbohydrate coverage insulin Outpatient Diabetes medications: OmniPod insulin pump with Humalog, Dexcom CGM Current orders for Inpatient glycemic control: None  Inpatient Diabetes Program Recommendations:     NOTE: Per chart, patient presented to ED on 12/23/20 around 12:16 am with concerns for hyperglycemia and being out of insulin.  Per Dr. Guido Sander note today, patient "States her on-call endocrinologist at [facility] recommended that she come to the emergency department.  Patient and daughter were hoping that we would be able to refill her OmniPod insulin pump with insulin here in the emergency department. Explained to patient and family that this would not be able to be done from the ER.  She received sliding scale insulin for hyperglycemia while in the [place] here but was not in DKA.  Unfortunately her sugars bottomed out into the 30s and she is now symptomatic.  She was given D50 and has been able to eat and drink.  We will monitor her blood glucose here.  She has normal renal function.  Will check urinalysis.  Patient states that she has a refill of her insulin at  her pharmacy which opens today at 8 AM."   Per Care Everywhere, patient sees Dr. Ander Gaster at [facility] and was last seen 10/02/20. Per office note on 10/02/20, patient has Type 1 DM and uses an insulin pump for DM management. Per note, the following should be insulin pump settings: Basal rates:  12 a.m. 0.175-->0.25- 4 a.m. 0.4--> 0.25  9 a.m. 0.875--->0.800 12 p.m. 0.65-->0.55--->0.55-->0.45 4pm 0.625--->0.55-->0.45 6 pm 0.725--->0.60 (decreased to 0.55 on 11/12/20) 7pm 0.5 (added 11/12/20) 9 p.m. 0.1 u/h-->0.075-->0.05 --->0.10  Insulin to carbohydrate ratios  12 a.m. 1:12 12 p.m. 1:14 5 p.m. 1:15  Insulin sensitivity factor  12 a.m. 75 5 a.m. 55-->65-->75 8p.m. 75 Active insulin duration = 4 hours BG targets = 12 a.m. 140-160 mg/dL   Patient's initial glucose was 349 mg/dl. Patient was given Novolog 15 units at 1:58 am today and glucose dropped to 36 at 5:51 am today. Hypoglycemia occurred due to high dose of Novolog. Per insulin pump settings in chart, 1 unit of insulin drops glucose 75 mg/dl.  [place] and spoke with Hilda Blades. Confirmed that patient has a prescription for Humalog that can be filled today. Asked that they go ahead and fill the prescription. Spoke with patient over the phone regarding DM. Patient states that she has had DM since 75 years old and uses OmniPod insulin pump with Humalog. Patient states that she has been having issues with getting her insulin pump supplies as she was getting them through  [facility] and in the process to switching to [facility]. Patient states she has one new OmniPod pod left and she plans to call again today about getting more OmniPod pump supplies. Patient states she ran out of Humalog insulin, she did not realize she was completely out until yesterday and her pharmacy was closed. Informed patient that I called [facility] and she does have a prescription for Humalog that can be filled and I had asked that they go ahead  and fill it. Patient states she plans to go by and get the insulin as soon as she is discharged today. Patient states that she was given too much insulin last night and her glucose dropped to 35 mg/dl. Patient states she was in the waiting room when her glucose dropped and she was able to go to the desk and asked them to have the nurse see her. She was taken back and hypoglycemia was treated with PO intake and D50.  Patient states her husband is bedbound and she provides care for him; so she stays busy with him and she has had a lot going on lately. Patient states that she does not have any long acting insulin at home to use in case of pump failure or if she is not able to get more OmniPod pods. Encouraged patient to call her Endocrinologist to request a prescription for long acting insulin to use just in case she needs it. Patient states she would like to find an Endocrinologist closer to home. Informed patient that [facility] has Dr. Gabriel Carina, Dr. Honor Junes, and Renae Gloss, NP with Endocrinology. Patient would like to get established with one of them and plans to call the office to see if she can get an appointment with them. Patient verbalized understanding of information and she has no questions or other concerns at this time related to her DM.   Thanks, Barnie Alderman, RN, MSN, CDE Diabetes Coordinator Inpatient Diabetes Program [phone] (Team Pager from 8am to 5pm)

## 2020-12-23 NOTE — ED Notes (Signed)
Breakfast tray ordered since no meal trays on department.

## 2020-12-23 NOTE — ED Notes (Signed)
This RN notified that patient checked CBG on home device and CBG reading low, this RN checked CBG and CBG reading 36, repeated test, repeat reading 35, EDP veronese aware, pt given 8oz apple juice and graham crackers and peanut butter to eat. Pt remains alert and drinking juice at this time.

## 2020-12-23 NOTE — ED Notes (Signed)
15units SubQ insulin given as discussed with EDP Ver[place] for CBG 387. Will recheck again in 1hr. Pt's daughter states repeated concerns over patient receiving a vile of insulin from the Emergency Department, this RN explained to patient's daughter that legally ER could not dispense medication like a pharmacy could. Updated plan of care with patients daughter. Pt states understanding at this time, understands will recheck CBG in 1hr and update EDP as necessary.

## 2020-12-23 NOTE — Discharge Instructions (Addendum)
Please follow-up with your endocrinologist.  I recommend that you go to your pharmacy today to pick up your insulin to refill your OmniPod. You also have a small urinary tract infection.  I will give you some Keflex antibiotic 1 pill 3 times a day to treat that.  Please return as needed.  Per your request, [facility] Endocr[facility] providers are: Dr. Mee Hives Dr. Caffie Pinto, NP Address: [address] [place], [place], [place] [address] Phone: [phone]

## 2020-12-23 NOTE — ED Triage Notes (Signed)
Pt states has been out of insulin since 1030 t his am. Pt is insulin dependent diabetic. Pt appears in no acute distress.

## 2020-12-25 LAB — CBG MONITORING, ED: Glucose-Capillary: 36 mg/dL — CL (ref 70–99)

## 2021-01-08 ENCOUNTER — Ambulatory Visit
Admission: EM | Admit: 2021-01-08 | Discharge: 2021-01-08 | Disposition: A | Discharge status: Home / Self Care | Payer: Medicare Other | Attending: Family Medicine | Admitting: Family Medicine

## 2021-01-08 ENCOUNTER — Other Ambulatory Visit: Payer: Self-pay

## 2021-01-08 DIAGNOSIS — R300 Dysuria: Secondary | ICD-10-CM | POA: Diagnosis present

## 2021-01-08 DIAGNOSIS — N3001 Acute cystitis with hematuria: Secondary | ICD-10-CM

## 2021-01-08 LAB — URINALYSIS, COMPLETE (UACMP) WITH MICROSCOPIC
Glucose, UA: NEGATIVE mg/dL
Ketones, ur: 15 mg/dL — AB
Nitrite: NEGATIVE
Protein, ur: 100 mg/dL — AB
Specific Gravity, Urine: 1.02 (ref 1.005–1.030)
WBC, UA: >50 WBC/hpf (ref 0–5)
pH: 5 (ref 5.0–8.0)

## 2021-01-08 MED ORDER — CIPROFLOXACIN HCL 500 MG PO TABS: 500.0000 mg | ORAL_TABLET | Freq: Two times a day (BID) | ORAL | 0 refills | Status: DC

## 2021-01-08 MED ORDER — CIPROFLOXACIN HCL 500 MG PO TABS: 500.0000 mg | ORAL_TABLET | Freq: Two times a day (BID) | ORAL | 0 refills | Status: AC

## 2021-01-08 NOTE — Discharge Instructions (Addendum)

## 2021-01-08 NOTE — ED Triage Notes (Signed)
Pt c/o possible bladder infection, reports recent ED visit and was diagnosed with UTI. Pt states she was given cephalexin x10d and did complete this. Pt states her urine has been cloudy for some time, also reports tingling/burning with urination for several days. Pt is concerned her UTI has not resolved.

## 2021-01-08 NOTE — ED Provider Notes (Signed)
[facility]    CSN: [id number] Arrival date & time: 01/08/21  1228      History   Chief Complaint Chief Complaint  Patient presents with   Dysuria    HPI Diana Frost is a 75 y.o. female presenting for 3-day history of dysuria, frequency and occasional urgency.  Also mentions some vaginal itching.  Patient states she was treated with Keflex 10-day course for UTI when she was in the hospital on 12/23/2020.  Patient completed full course.  She says she actually was not really having any urinary symptoms at the time she was put on Keflex.  Admits to cloudy urine with an odor at this time.  No fevers, flank pain or abdominal pain.  No nausea/vomiting or chills.  No other complaints.  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   AKI (acute kidney injury) ([place]) 08/13/2016   Hypokalemia 08/13/2016   DKA (diabetic ketoacidoses) 08/12/2016   Melanoma ([place]) 11/01/2015   Stable angina ([place]) 10/10/2015   Coronary artery disease involving native coronary artery of native heart 11/16/2012   History of insertion of insulin pump 05/24/2012   Type 1 diabetes mellitus ([place]) 02/11/2011   Depression 02/11/2011    Past Surgical History:  Procedure Laterality Date   ABDOMINAL HYSTERECTOMY     BACK SURGERY     tail bone removed after fall/fracture   CATARACT EXTRACTION BILATERAL W/ ANTERIOR VITRECTOMY     CORONARY ANGIOPLASTY WITH STENT PLACEMENT  11/01/13   [facility], stent x2   HIP PINNING,CANNULATED Right 01/07/2019    Procedure: CANNULATED HIP PINNING;  Surgeon: Corky Mull, MD;  Location: [facility];  Service: Orthopedics;  Laterality: Right;   LEFT HEART CATH AND CORONARY ANGIOGRAPHY N/A 08/14/2016   Procedure: Left Heart Cath and Coronary Angiography;  Surgeon: Minna Merritts, MD;  Location: [facility];  Service: Cardiovascular;  Laterality: N/A;   MELANOMA EXCISION Right    arm with skin graft   MYRINGOTOMY WITH TUBE PLACEMENT Left 08/21/2015   Procedure: MYRINGOTOMY WITH TUBE PLACEMENT;  Surgeon: Carloyn Manner, MD;  Location: [facility];  Service: ENT;  Laterality: Left;   NASOPHARYNGEAL BIOPSY N/A 11/07/2015   Procedure: NASOPHARYNGEAL BIOPSY;  Surgeon: Carloyn Manner, MD;  Location: [facility];  Service: ENT;  Laterality: N/A;   TONSILLECTOMY      OB History   No obstetric history on file.      Home Medications    Prior to Admission medications   Medication Sig Start Date End Date Taking? Authorizing Provider  acetaminophen (TYLENOL) 325 MG tablet Take 2 tablets (650 mg total) by mouth every 6 (six) hours as needed for mild pain, fever or headache (pain score 1-3 or temp > 100.5). 01/10/19  Yes Gladstone Lighter, MD  alendronate (FOSAMAX) 70 MG tablet Take 70 mg by mouth once a week. 11/23/20  Yes [provider]  aspirin 81 MG tablet Take 81 mg by mouth daily.   Yes [provider]  cyanocobalamin (,VITAMIN B-12,) 1000  MCG/ML injection Inject 1,000 mcg into the muscle every 30 (thirty) days. 10/05/20  Yes [provider]  docusate sodium (COLACE) 100 MG capsule Take 1 capsule (100 mg total) by mouth 2 (two) times daily. 01/10/19  Yes Gladstone Lighter, MD  FLUoxetine (PROZAC) 40 MG capsule Take 40 mg by mouth every evening.    Yes [provider]  Glucagon, rDNA, (GLUCAGON EMERGENCY IJ) Inject as directed as needed (for low blood sugar).   Yes [provider]  insulin glargine (LANTUS) 100 UNIT/ML injection Inject 13 Units into the  skin daily as needed (If pump does not lower blood sugars). As needed if insulin pump is not working    Yes [provider]  insulin lispro (HUMALOG) 100 UNIT/ML injection Inject 0-23 Units into the skin daily. VIA PUMP 09/29/18  Yes [provider]  rosuvastatin (CRESTOR) 10 MG tablet Take 10 mg by mouth at bedtime. 12/05/18  Yes [provider]  ciprofloxacin (CIPRO) 500 MG tablet Take 1 tablet (500 mg total) by mouth every 12 (twelve) hours for 7 days. 01/08/21 01/15/21  Danton Clap, PA-C    Family History Family History  Problem Relation Age of Onset   Lung cancer Father    Breast cancer Maternal Grandfather     Social History Social History   Tobacco Use   Smoking status: Former    Types: Cigarettes    Quit date: 04/30/1998    Years since quitting: 22.7   Smokeless tobacco: Never  Substance Use Topics   Alcohol use: Yes    Alcohol/week: 17.0 standard drinks    Types: 17 Glasses of wine per week    Comment:     Drug use: No     Allergies   Percocet [oxycodone-acetaminophen], Statins, and Sulfa antibiotics   Review of Systems Review of Systems  Constitutional:  Negative for chills and fever.  Gastrointestinal:  Negative for abdominal pain, diarrhea, nausea and vomiting.  Genitourinary:  Positive for dysuria, frequency and urgency. Negative for decreased urine volume, flank pain, hematuria, pelvic pain, vaginal bleeding, vaginal discharge and vaginal pain.  Musculoskeletal:  Negative for back pain.  Skin:  Negative for rash.    Physical Exam Triage Vital Signs ED Triage Vitals  Enc Vitals Group     BP 01/08/21 1334 122/79     Pulse Rate 01/08/21 1334 96     Resp 01/08/21 1334 18     Temp 01/08/21 1334 98.9 F (37.2 C)     Temp Source 01/08/21 1334 Oral     SpO2 01/08/21 1334 100 %     Weight 01/08/21 1331 128 lb (58.1 kg)     Height 01/08/21 1331 5\' 2"  (1.575 m)     Head Circumference --      Peak Flow --      Pain Score 01/08/21  1330 4     Pain Loc --      Pain Edu? --      Excl. in [place]? --    No data found.  Updated Vital Signs BP 122/79 (BP Location: Left Arm)   Pulse 96   Temp 98.9 F (37.2 C) (Oral)   Resp 18   Ht 5\' 2"  (1.575 m)   Wt 128 lb (58.1 kg)   SpO2 100%   BMI 23.41 kg/m      Physical Exam Vitals and nursing note reviewed.  Constitutional:      General: She is not in acute distress.    Appearance: Normal  appearance. She is not ill-appearing or toxic-appearing.  HENT:     Head: Normocephalic and atraumatic.  Eyes:     General: No scleral icterus.       Right eye: No discharge.        Left eye: No discharge.     Conjunctiva/sclera: Conjunctivae normal.  Cardiovascular:     Rate and Rhythm: Normal rate and regular rhythm.     Heart sounds: Normal heart sounds.  Pulmonary:     Effort: Pulmonary effort is normal. No respiratory distress.     Breath sounds: Normal breath sounds.  Abdominal:     Palpations: Abdomen is soft.     Tenderness: There is no abdominal tenderness. There is no right CVA tenderness or left CVA tenderness.  Musculoskeletal:     Cervical back: Neck supple.  Skin:    General: Skin is dry.  Neurological:     General: No focal deficit present.     Mental Status: She is alert. Mental status is at baseline.     Motor: No weakness.     Gait: Gait normal.  Psychiatric:        Mood and Affect: Mood normal.        Behavior: Behavior normal.        Thought Content: Thought content normal.     UC Treatments / Results  Labs (all labs ordered are listed, but only abnormal results are displayed) Labs Reviewed  URINALYSIS, COMPLETE (UACMP) WITH MICROSCOPIC - Abnormal; Notable for the following components:      Result Value   APPearance CLOUDY (*)    Hgb urine dipstick MODERATE (*)    Bilirubin Urine SMALL (*)    Ketones, ur 15 (*)    Protein, ur 100 (*)    Leukocytes,Ua MODERATE (*)    Bacteria, UA MANY (*)    All other components within normal limits  URINE  CULTURE    EKG   Radiology No results found.  Procedures Procedures (including critical care time)  Medications Ordered in UC Medications - No data to display  Initial Impression / Assessment and Plan / UC Course  I have reviewed the triage vital signs and the nursing notes.  Pertinent labs & imaging results that were available during my care of the patient were reviewed by me and considered in my medical decision making (see chart for details).  75 year old female presenting for dysuria over the past 3 days.  Patient recently treated for UTI with Keflex 10-day course.  Completed full course.  Patient was seen at [facility] on 12/23/2020.  Unfortunately urine culture was not sent so I am unable to see what bacteria patient had.  Vitals all normal and stable and she is well-appearing at this time.  Exam unremarkable.  No abdominal tenderness or CVA tenderness.  UA obtained today shows cloudy urine, moderate hemoglobin, small bili, ketones, protein, moderate leukocytes and many bacteria.  We will send urine for culture.  Treating patient at this time with Cipro since she failed Keflex, will avoid other beta-lactam's.  Patient also has sulfa allergy so obviously avoiding Bactrim.  Advised to increase her rest and fluids.  Advised patient we will contact her with the culture results in a couple of days if we need to change anything.  Thoroughly reviewed ED precautions with her.   Final Clinical Impressions(s) / UC Diagnoses   Final diagnoses:  Acute cystitis with hematuria  Dysuria     Discharge Instructions      UTI:  Based on either symptoms or urinalysis, you may have a urinary tract infection. We will send the urine for culture and call with results in a few days. Begin antibiotics at this time. Your symptoms should be much improved over the next 2-3 days. Increase rest and fluid intake. If for some reason symptoms are worsening or not improving after a couple of days or the urine  culture determines the antibiotics you are taking will not treat the infection, the antibiotics may be changed. Return or go to ER for fever, back pain, worsening urinary pain, discharge, increased blood in urine. May take Tylenol or Motrin OTC for pain relief or consider AZO if no contraindications      ED Prescriptions     Medication Sig Dispense Auth. Provider   ciprofloxacin (CIPRO) 500 MG tablet  (Status: Discontinued) Take 1 tablet (500 mg total) by mouth every 12 (twelve) hours for 7 days. 14 tablet Laurene Footman B, PA-C   ciprofloxacin (CIPRO) 500 MG tablet Take 1 tablet (500 mg total) by mouth every 12 (twelve) hours for 7 days. 14 tablet Gretta Cool      PDMP not reviewed this encounter.   Danton Clap, PA-C 01/08/21 1438

## 2021-01-11 LAB — URINE CULTURE: Culture: >=100000 — AB

## 2021-09-25 ENCOUNTER — Other Ambulatory Visit: Payer: Self-pay | Admitting: Gerontology

## 2021-09-25 DIAGNOSIS — Z1231 Encounter for screening mammogram for malignant neoplasm of breast: Secondary | ICD-10-CM

## 2021-10-10 ENCOUNTER — Emergency Department: Payer: Medicare Other

## 2021-10-10 ENCOUNTER — Other Ambulatory Visit: Payer: Self-pay

## 2021-10-10 ENCOUNTER — Inpatient Hospital Stay
Admission: EM | Admit: 2021-10-10 | Discharge: 2021-10-15 | DRG: 641 | Disposition: A | Discharge status: Home Health | Payer: Medicare Other | Attending: Internal Medicine | Admitting: Internal Medicine

## 2021-10-10 ENCOUNTER — Encounter: Payer: Self-pay | Admitting: Intensive Care

## 2021-10-10 DIAGNOSIS — I951 Orthostatic hypotension: Secondary | ICD-10-CM | POA: Diagnosis present

## 2021-10-10 DIAGNOSIS — I10 Essential (primary) hypertension: Secondary | ICD-10-CM | POA: Diagnosis present

## 2021-10-10 DIAGNOSIS — I252 Old myocardial infarction: Secondary | ICD-10-CM

## 2021-10-10 DIAGNOSIS — Z79899 Other long term (current) drug therapy: Secondary | ICD-10-CM

## 2021-10-10 DIAGNOSIS — E860 Dehydration: Secondary | ICD-10-CM | POA: Diagnosis present

## 2021-10-10 DIAGNOSIS — Z882 Allergy status to sulfonamides status: Secondary | ICD-10-CM

## 2021-10-10 DIAGNOSIS — R1110 Vomiting, unspecified: Secondary | ICD-10-CM

## 2021-10-10 DIAGNOSIS — Z9289 Personal history of other medical treatment: Secondary | ICD-10-CM

## 2021-10-10 DIAGNOSIS — I2510 Atherosclerotic heart disease of native coronary artery without angina pectoris: Secondary | ICD-10-CM | POA: Diagnosis present

## 2021-10-10 DIAGNOSIS — R197 Diarrhea, unspecified: Secondary | ICD-10-CM

## 2021-10-10 DIAGNOSIS — B9620 Unspecified Escherichia coli [E. coli] as the cause of diseases classified elsewhere: Secondary | ICD-10-CM | POA: Diagnosis not present

## 2021-10-10 DIAGNOSIS — R54 Age-related physical debility: Secondary | ICD-10-CM | POA: Diagnosis present

## 2021-10-10 DIAGNOSIS — Z7983 Long term (current) use of bisphosphonates: Secondary | ICD-10-CM

## 2021-10-10 DIAGNOSIS — Z85820 Personal history of malignant melanoma of skin: Secondary | ICD-10-CM

## 2021-10-10 DIAGNOSIS — N390 Urinary tract infection, site not specified: Secondary | ICD-10-CM

## 2021-10-10 DIAGNOSIS — E109 Type 1 diabetes mellitus without complications: Secondary | ICD-10-CM | POA: Diagnosis present

## 2021-10-10 DIAGNOSIS — Z20822 Contact with and (suspected) exposure to covid-19: Secondary | ICD-10-CM | POA: Diagnosis present

## 2021-10-10 DIAGNOSIS — R55 Syncope and collapse: Secondary | ICD-10-CM | POA: Diagnosis not present

## 2021-10-10 DIAGNOSIS — Z888 Allergy status to other drugs, medicaments and biological substances status: Secondary | ICD-10-CM

## 2021-10-10 DIAGNOSIS — Z87891 Personal history of nicotine dependence: Secondary | ICD-10-CM

## 2021-10-10 DIAGNOSIS — Z9641 Presence of insulin pump (external) (internal): Secondary | ICD-10-CM | POA: Diagnosis present

## 2021-10-10 DIAGNOSIS — Z6823 Body mass index (BMI) 23.0-23.9, adult: Secondary | ICD-10-CM

## 2021-10-10 DIAGNOSIS — Z794 Long term (current) use of insulin: Secondary | ICD-10-CM

## 2021-10-10 DIAGNOSIS — R000 Tachycardia, unspecified: Secondary | ICD-10-CM

## 2021-10-10 DIAGNOSIS — Z7982 Long term (current) use of aspirin: Secondary | ICD-10-CM

## 2021-10-10 DIAGNOSIS — E861 Hypovolemia: Principal | ICD-10-CM | POA: Diagnosis present

## 2021-10-10 DIAGNOSIS — R112 Nausea with vomiting, unspecified: Secondary | ICD-10-CM | POA: Diagnosis present

## 2021-10-10 DIAGNOSIS — Z801 Family history of malignant neoplasm of trachea, bronchus and lung: Secondary | ICD-10-CM

## 2021-10-10 DIAGNOSIS — Z66 Do not resuscitate: Secondary | ICD-10-CM | POA: Diagnosis not present

## 2021-10-10 DIAGNOSIS — Z955 Presence of coronary angioplasty implant and graft: Secondary | ICD-10-CM

## 2021-10-10 LAB — URINALYSIS, ROUTINE W REFLEX MICROSCOPIC
Bilirubin Urine: NEGATIVE
Glucose, UA: 50 mg/dL — AB
Hgb urine dipstick: NEGATIVE
Ketones, ur: 5 mg/dL — AB
Nitrite: NEGATIVE
Protein, ur: NEGATIVE mg/dL
Specific Gravity, Urine: 1.012 (ref 1.005–1.030)
pH: 5 (ref 5.0–8.0)

## 2021-10-10 LAB — BASIC METABOLIC PANEL
Anion gap: 8 (ref 5–15)
BUN: 14 mg/dL (ref 8–23)
CO2: 27 mmol/L (ref 22–32)
Calcium: 8.7 mg/dL — ABNORMAL LOW (ref 8.9–10.3)
Chloride: 103 mmol/L (ref 98–111)
Creatinine, Ser: 0.98 mg/dL (ref 0.44–1.00)
GFR, Estimated: 60 mL/min — ABNORMAL LOW (ref >60)
Glucose, Bld: 149 mg/dL — ABNORMAL HIGH (ref 70–99)
Potassium: 3.9 mmol/L (ref 3.5–5.1)
Sodium: 138 mmol/L (ref 135–145)

## 2021-10-10 LAB — CBC
HCT: 38.5 % (ref 36.0–46.0)
Hemoglobin: 12.4 g/dL (ref 12.0–15.0)
MCH: 31.2 pg (ref 26.0–34.0)
MCHC: 32.2 g/dL (ref 30.0–36.0)
MCV: 97 fL (ref 80.0–100.0)
Platelets: 188 10*3/uL (ref 150–400)
RBC: 3.97 MIL/uL (ref 3.87–5.11)
RDW: 12.8 % (ref 11.5–15.5)
WBC: 4.4 10*3/uL (ref 4.0–10.5)
nRBC: 0 % (ref 0.0–0.2)

## 2021-10-10 LAB — CBG MONITORING, ED: Glucose-Capillary: 168 mg/dL — ABNORMAL HIGH (ref 70–99)

## 2021-10-10 LAB — TROPONIN I (HIGH SENSITIVITY): Troponin I (High Sensitivity): 2 ng/L (ref <18)

## 2021-10-10 MED ORDER — SODIUM CHLORIDE 0.9 % IV BOLUS
1000.0000 mL | Freq: Once | INTRAVENOUS | Status: AC
  Administered 2021-10-10: 1000 mL via INTRAVENOUS

## 2021-10-10 NOTE — ED Provider Notes (Signed)
[facility] Provider Note    Event Date/Time   First MD Initiated Contact with Patient 10/10/21 1740     (approximate)   History   Hypotension and Loss of Consciousness   HPI  Diana Frost is a 76 y.o. female past medical history of coronary artery disease, diabetes who presents with syncope.  Patient notes that she went to the eye doctor today was not feeling quite herself felt somewhat nauseous and had some abdominal pain this morning.  When she came home she was sitting down and walk to the bathroom felt lightheaded and thinks she may have syncopized.  She was helped by her husband assistant to the bed.  She then felt like she had to have diarrhea and her daughter notes that she was very pale and could not get up on her own.  She went to the bathroom and seemed to pass out again.  Had several episodes of diarrhea.  Patient also had several episodes of vomiting today.  She notes that she has rather chronic vomiting although its been worse for the past 2 weeks.  Has about 1-2 episodes of nonbilious nonbloody emesis per day sometimes just several times per week.  Currently patient endorses feeling very thirsty and like her lips are dry.  She denies chest pain dyspnea abdominal pain.  No nausea currently.  Does have chills no urinary symptoms.    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   AKI (acute kidney injury) ([place]) 08/13/2016   Hypokalemia 08/13/2016   DKA (diabetic ketoacidoses) 08/12/2016   Melanoma ([place])  11/01/2015   Stable angina ([place]) 10/10/2015   Coronary artery disease involving native coronary artery of native heart 11/16/2012   History of insertion of insulin pump 05/24/2012   Type 1 diabetes mellitus ([place]) 02/11/2011   Depression 02/11/2011     Physical Exam  Triage Vital Signs: ED Triage Vitals  Enc Vitals Group     BP 10/10/21 1450 112/63     Pulse Rate 10/10/21 1450 (!) 106     Resp 10/10/21 1450 16     Temp 10/10/21 1450 (!) 97.5 F (36.4 C)     Temp Source 10/10/21 1450 Oral     SpO2 10/10/21 1450 96 %     Weight 10/10/21 1451 130 lb (59 kg)     Height 10/10/21 1451 '5\' 2"'$  (1.575 m)     Head Circumference --      Peak Flow --      Pain Score 10/10/21 1451 0     Pain Loc --      Pain Edu? --      Excl. in [place]? --     Most recent vital signs: Vitals:   10/10/21 2000 10/10/21 2200  BP: 132/77 127/67  Pulse: (!) 119 (!) 118  Resp:    Temp:    SpO2: 95% 93%     General: Awake, no distress.  CV:  Good peripheral perfusion.  Resp:  Normal effort.  Abd:  No distention.  Soft nontender throughout Neuro:             Awake, Alert, Oriented x 3  Other:  No peripheral edema Mild midline thoracic tenderness no step-offs no CVA or lumbar spine tenderness   ED Results / Procedures / Treatments  Labs (all labs ordered are listed, but only abnormal results are displayed) Labs Reviewed  BASIC METABOLIC PANEL - Abnormal; Notable for the following components:      Result Value   Glucose, Bld 149 (*)    Calcium 8.7 (*)    GFR, Estimated 60 (*)    All other components within normal limits  URINALYSIS, ROUTINE W REFLEX MICROSCOPIC - Abnormal; Notable for the following components:   Color, Urine AMBER (*)    APPearance CLOUDY (*)    Glucose, UA 50 (*)    Ketones, ur 5 (*)    Leukocytes,Ua TRACE (*)    Bacteria, UA FEW (*)    All other components within normal limits  CBG MONITORING, ED - Abnormal; Notable for the following components:   Glucose-Capillary 168 (*)     All other components within normal limits  SARS CORONAVIRUS 2 BY RT PCR  CBC  LACTIC ACID, PLASMA  LACTIC ACID, PLASMA  TSH  T4, FREE  D-DIMER, QUANTITATIVE  TROPONIN I (HIGH SENSITIVITY)     EKG  EKG interpreted by myself shows sinus tachycardia normal axis normal intervals low voltage   RADIOLOGY I reviewed and interpreted the CT scan of the brain which does not show any acute intracranial process    PROCEDURES:  Critical Care performed: No  .1-3 Lead EKG Interpretation  Performed by: Rada Hay, MD Authorized by: Rada Hay, MD     Interpretation: normal     ECG rate assessment: tachycardic     Rhythm: sinus tachycardia     Ectopy: none     Conduction: normal     The patient is on the cardiac monitor to evaluate for evidence of arrhythmia and/or significant heart rate changes.   MEDICATIONS ORDERED IN ED: Medications  sodium chloride 0.9 % bolus 1,000 mL (0 mLs Intravenous Stopped 10/10/21 2051)  sodium chloride 0.9 % bolus 1,000 mL (1,000 mLs Intravenous New Bag/Given 10/10/21 2049)     IMPRESSION / MDM / ASSESSMENT AND PLAN / ED COURSE  I reviewed the triage vital signs and the nursing notes.                              Patient's presentation is most consistent with acute presentation with potential threat to life or bodily function.  Differential diagnosis includes, but is not limited to, hypovolemia,, ACS, UTI, AKI, electrolyte abnormality     Patient is a 76 year old female presents with presyncope.  She notes that she was generally feeling unwell today with some nausea and fatigue.  Then she had a syncopal episode bathroom and had recurrent presyncope while having diarrhea.  She denies any ongoing nausea vomiting or diarrhea denies chest pain dyspnea fevers chills.  She endorses dry mouth and feeling very thirsty but otherwise has no other acute complaints.  Initial heart rate is in the low 100s vitals otherwise within normal limits.   Plan to give a liter of fluid check basic labs.  CT head and C-spine were obtained from triage given her syncope with fall and these are negative.  She is having some midline thoracic back pain so we will obtain x-ray of  the T-spine as well.  Patient's labs are reassuring BMP is largely within normal limits CBC is without leukocytosis or anemia and troponin is negative.  EKG is nonischemic.  After liter of fluids patient's heart rate has actually increased and is now in the 110s  On reassessment she feels well has no other complaints.  We will give another liter of fluid and reassess.  After liter fluid patient is persistently tachycardic heart rate has been feeling not improved.  At this point we will extend the work-up obtain D-dimer TSH free T4 lactate.  UA does not show evidence of infection.  Also obtained a chest x-ray which shows possible atelectasis versus early pneumonia.    FINAL CLINICAL IMPRESSION(S) / ED DIAGNOSES   Final diagnoses:  Syncope and collapse  Dehydration     Rx / DC Orders   ED Discharge Orders     None        Note:  This document was prepared using Dragon voice recognition software and may include unintentional dictation errors.   Rada Hay, MD 10/10/21 [phone]

## 2021-10-10 NOTE — ED Triage Notes (Signed)
Patient arrived by EMS from home with c/o multiple syncopal episodes and x/o dizziness. Reports hitting head this AM. Pale and diaphorectic per EMS. Also has been c/o abdominal pain X1 week. EMS reports 1 episode brown color emesis. EMS administered '4mg'$  zofran and 400cc fluids. EMS vitals HR 110, 134cbg, 97.4oral, 88/68mnual.

## 2021-10-11 ENCOUNTER — Emergency Department: Payer: Medicare Other

## 2021-10-11 DIAGNOSIS — R55 Syncope and collapse: Secondary | ICD-10-CM | POA: Diagnosis present

## 2021-10-11 DIAGNOSIS — R197 Diarrhea, unspecified: Secondary | ICD-10-CM | POA: Diagnosis present

## 2021-10-11 DIAGNOSIS — Z9641 Presence of insulin pump (external) (internal): Secondary | ICD-10-CM | POA: Diagnosis present

## 2021-10-11 DIAGNOSIS — R1110 Vomiting, unspecified: Secondary | ICD-10-CM

## 2021-10-11 DIAGNOSIS — Z7982 Long term (current) use of aspirin: Secondary | ICD-10-CM | POA: Diagnosis not present

## 2021-10-11 DIAGNOSIS — Z888 Allergy status to other drugs, medicaments and biological substances status: Secondary | ICD-10-CM | POA: Diagnosis not present

## 2021-10-11 DIAGNOSIS — I252 Old myocardial infarction: Secondary | ICD-10-CM | POA: Diagnosis not present

## 2021-10-11 DIAGNOSIS — Z66 Do not resuscitate: Secondary | ICD-10-CM | POA: Diagnosis not present

## 2021-10-11 DIAGNOSIS — E861 Hypovolemia: Secondary | ICD-10-CM | POA: Diagnosis present

## 2021-10-11 DIAGNOSIS — I10 Essential (primary) hypertension: Secondary | ICD-10-CM | POA: Diagnosis present

## 2021-10-11 DIAGNOSIS — N390 Urinary tract infection, site not specified: Secondary | ICD-10-CM | POA: Diagnosis not present

## 2021-10-11 DIAGNOSIS — Z85820 Personal history of malignant melanoma of skin: Secondary | ICD-10-CM | POA: Diagnosis not present

## 2021-10-11 DIAGNOSIS — Z20822 Contact with and (suspected) exposure to covid-19: Secondary | ICD-10-CM | POA: Diagnosis present

## 2021-10-11 DIAGNOSIS — Z882 Allergy status to sulfonamides status: Secondary | ICD-10-CM | POA: Diagnosis not present

## 2021-10-11 DIAGNOSIS — Z955 Presence of coronary angioplasty implant and graft: Secondary | ICD-10-CM | POA: Diagnosis not present

## 2021-10-11 DIAGNOSIS — Z87891 Personal history of nicotine dependence: Secondary | ICD-10-CM | POA: Diagnosis not present

## 2021-10-11 DIAGNOSIS — Z7983 Long term (current) use of bisphosphonates: Secondary | ICD-10-CM | POA: Diagnosis not present

## 2021-10-11 DIAGNOSIS — B9620 Unspecified Escherichia coli [E. coli] as the cause of diseases classified elsewhere: Secondary | ICD-10-CM | POA: Diagnosis not present

## 2021-10-11 DIAGNOSIS — E109 Type 1 diabetes mellitus without complications: Secondary | ICD-10-CM | POA: Diagnosis present

## 2021-10-11 DIAGNOSIS — Z801 Family history of malignant neoplasm of trachea, bronchus and lung: Secondary | ICD-10-CM | POA: Diagnosis not present

## 2021-10-11 DIAGNOSIS — R54 Age-related physical debility: Secondary | ICD-10-CM | POA: Diagnosis present

## 2021-10-11 DIAGNOSIS — R000 Tachycardia, unspecified: Secondary | ICD-10-CM | POA: Diagnosis present

## 2021-10-11 DIAGNOSIS — E860 Dehydration: Secondary | ICD-10-CM | POA: Diagnosis present

## 2021-10-11 DIAGNOSIS — R112 Nausea with vomiting, unspecified: Secondary | ICD-10-CM | POA: Diagnosis present

## 2021-10-11 DIAGNOSIS — I2510 Atherosclerotic heart disease of native coronary artery without angina pectoris: Secondary | ICD-10-CM | POA: Diagnosis present

## 2021-10-11 DIAGNOSIS — I951 Orthostatic hypotension: Secondary | ICD-10-CM | POA: Diagnosis present

## 2021-10-11 LAB — CBC
HCT: 36.7 % (ref 36.0–46.0)
Hemoglobin: 11.7 g/dL — ABNORMAL LOW (ref 12.0–15.0)
MCH: 31 pg (ref 26.0–34.0)
MCHC: 31.9 g/dL (ref 30.0–36.0)
MCV: 97.1 fL (ref 80.0–100.0)
Platelets: 179 10*3/uL (ref 150–400)
RBC: 3.78 MIL/uL — ABNORMAL LOW (ref 3.87–5.11)
RDW: 13 % (ref 11.5–15.5)
WBC: 10.6 10*3/uL — ABNORMAL HIGH (ref 4.0–10.5)
nRBC: 0 % (ref 0.0–0.2)

## 2021-10-11 LAB — CBG MONITORING, ED
Glucose-Capillary: 164 mg/dL — ABNORMAL HIGH (ref 70–99)
Glucose-Capillary: 236 mg/dL — ABNORMAL HIGH (ref 70–99)
Glucose-Capillary: 229 mg/dL — ABNORMAL HIGH (ref 70–99)
Glucose-Capillary: 78 mg/dL (ref 70–99)
Glucose-Capillary: 150 mg/dL — ABNORMAL HIGH (ref 70–99)

## 2021-10-11 LAB — HEMOGLOBIN A1C
Hgb A1c MFr Bld: 7.2 % — ABNORMAL HIGH (ref 4.8–5.6)
Mean Plasma Glucose: 159.94 mg/dL

## 2021-10-11 LAB — CREATININE, SERUM
Creatinine, Ser: 0.77 mg/dL (ref 0.44–1.00)
GFR, Estimated: >60 mL/min (ref >60)

## 2021-10-11 LAB — LACTIC ACID, PLASMA
Lactic Acid, Venous: 1.1 mmol/L (ref 0.5–1.9)
Lactic Acid, Venous: 2.3 mmol/L (ref 0.5–1.9)

## 2021-10-11 LAB — POTASSIUM: Potassium: 4.5 mmol/L (ref 3.5–5.1)

## 2021-10-11 LAB — D-DIMER, QUANTITATIVE: D-Dimer, Quant: 2.38 ug/mL-FEU — ABNORMAL HIGH (ref 0.00–0.50)

## 2021-10-11 LAB — TSH: TSH: 0.777 u[IU]/mL (ref 0.350–4.500)

## 2021-10-11 LAB — T4, FREE: Free T4: 1.15 ng/dL — ABNORMAL HIGH (ref 0.61–1.12)

## 2021-10-11 LAB — SARS CORONAVIRUS 2 BY RT PCR: SARS Coronavirus 2 by RT PCR: NEGATIVE

## 2021-10-11 MED ORDER — ACETAMINOPHEN 325 MG PO TABS
650.0000 mg | ORAL_TABLET | Freq: Four times a day (QID) | ORAL | Status: DC | PRN
Start: 2021-10-11 — End: 2021-10-11

## 2021-10-11 MED ORDER — ONDANSETRON HCL 4 MG PO TABS: 4.0000 mg | ORAL_TABLET | Freq: Four times a day (QID) | ORAL | Status: DC | PRN

## 2021-10-11 MED ORDER — MORPHINE SULFATE (PF) 4 MG/ML IV SOLN
4.0000 mg | Freq: Once | INTRAVENOUS | Status: AC
  Administered 2021-10-11: 4 mg via INTRAVENOUS
  Filled 2021-10-11: qty 1

## 2021-10-11 MED ORDER — ONDANSETRON HCL 4 MG/2ML IJ SOLN
4.0000 mg | Freq: Four times a day (QID) | INTRAMUSCULAR | Status: DC | PRN
  Administered 2021-10-11: 4 mg via INTRAVENOUS
  Filled 2021-10-11: qty 2

## 2021-10-11 MED ORDER — ASPIRIN 81 MG PO TBEC
81.0000 mg | DELAYED_RELEASE_TABLET | Freq: Every day | ORAL | Status: DC
  Administered 2021-10-11 – 2021-10-15 (×5): 81 mg via ORAL
  Filled 2021-10-11 (×5): qty 1

## 2021-10-11 MED ORDER — METOPROLOL TARTRATE 25 MG PO TABS
12.5000 mg | ORAL_TABLET | Freq: Two times a day (BID) | ORAL | Status: DC
  Administered 2021-10-11 (×2): 12.5 mg via ORAL
  Filled 2021-10-11 (×2): qty 1

## 2021-10-11 MED ORDER — ACETAMINOPHEN 325 MG PO TABS: 650.0000 mg | ORAL_TABLET | Freq: Four times a day (QID) | ORAL | Status: DC | PRN

## 2021-10-11 MED ORDER — LACTATED RINGERS IV SOLN: INTRAVENOUS | Status: DC

## 2021-10-11 MED ORDER — INSULIN ASPART 100 UNIT/ML IJ SOLN
0.0000 [IU] | Freq: Every day | INTRAMUSCULAR | Status: DC
  Administered 2021-10-12: 2 [IU] via SUBCUTANEOUS
  Filled 2021-10-11: qty 1

## 2021-10-11 MED ORDER — INSULIN ASPART 100 UNIT/ML IJ SOLN
0.0000 [IU] | Freq: Three times a day (TID) | INTRAMUSCULAR | Status: DC
  Administered 2021-10-11: 3 [IU] via SUBCUTANEOUS
  Administered 2021-10-11: 1 [IU] via SUBCUTANEOUS
  Administered 2021-10-11: 3 [IU] via SUBCUTANEOUS
  Administered 2021-10-12: 9 [IU] via SUBCUTANEOUS
  Administered 2021-10-12: 2 [IU] via SUBCUTANEOUS
  Administered 2021-10-13: 3 [IU] via SUBCUTANEOUS
  Administered 2021-10-13: 7 [IU] via SUBCUTANEOUS
  Administered 2021-10-14: 2 [IU] via SUBCUTANEOUS
  Administered 2021-10-14: 3 [IU] via SUBCUTANEOUS
  Administered 2021-10-14 – 2021-10-15 (×2): 1 [IU] via SUBCUTANEOUS
  Administered 2021-10-15: 5 [IU] via SUBCUTANEOUS
  Administered 2021-10-15: 3 [IU] via SUBCUTANEOUS
  Filled 2021-10-11 (×13): qty 1

## 2021-10-11 MED ORDER — IOHEXOL 350 MG/ML SOLN
75.0000 mL | Freq: Once | INTRAVENOUS | Status: AC | PRN
  Administered 2021-10-11: 75 mL via INTRAVENOUS

## 2021-10-11 MED ORDER — ENOXAPARIN SODIUM 40 MG/0.4ML IJ SOSY
40.0000 mg | PREFILLED_SYRINGE | INTRAMUSCULAR | Status: DC
  Administered 2021-10-11 – 2021-10-15 (×5): 40 mg via SUBCUTANEOUS
  Filled 2021-10-11 (×5): qty 0.4

## 2021-10-11 MED ORDER — FLUOXETINE HCL 20 MG PO CAPS: 40.0000 mg | ORAL_CAPSULE | Freq: Every evening | ORAL | Status: DC

## 2021-10-11 MED ORDER — ROSUVASTATIN CALCIUM 10 MG PO TABS
10.0000 mg | ORAL_TABLET | Freq: Every day | ORAL | Status: DC
  Administered 2021-10-11 – 2021-10-14 (×4): 10 mg via ORAL
  Filled 2021-10-11 (×4): qty 1

## 2021-10-11 MED ORDER — SODIUM CHLORIDE 0.9 % IV SOLN
INTRAVENOUS | Status: AC
Start: 2021-10-11 — End: 2021-10-11

## 2021-10-11 MED ORDER — ACETAMINOPHEN 650 MG RE SUPP: 650.0000 mg | Freq: Four times a day (QID) | RECTAL | Status: DC | PRN

## 2021-10-11 NOTE — H&P (Signed)
History and Physical    Patient: Diana Frost NTZ:[id number] DOB: 1946/02/09 DOA: 10/10/2021 DOS: the patient was seen and examined on 10/11/2021 PCP: Latanya Maudlin, NP  Patient coming from: Home  Chief Complaint:  Chief Complaint  Patient presents with   Hypotension   Loss of Consciousness    HPI: Diana Frost is a 76 y.o. female with medical history significant for Type 1 diabetes on an insulin pump, hypertension, CAD who was brought to the ED after 2 syncopal episodes at home.  Patient has a history of intermittent vomiting but over the past 2 weeks she has had an increased frequency of vomiting.  They have been nonbloody and nonbilious.  Additionally she has had several episodes of nonbloody and nonmelanotic diarrhea.  She denies abdominal pain, fever or chills.  She has had no chest pain, shortness of breath or lightheadedness. ED course and data review: On arrival, afebrile.  During several hours in the emergency room patient has been persistently tachycardic in spite of IV fluid resuscitation with heart rate mostly in the 120s.  Also tachypneic to 20-24.  O2 sat initially 96% on room air but downtrending to 86% by the time of admission Labs: D-dimer 2.38 and troponin to CBC and BMP essentially unremarkable.  Urinalysis with 5 ketones.  COVID-negative.  TSH normal at 0.777 but free T4 slightly elevated at 1.15.  Lactic acid normal. EKG, personally viewed and interpreted with sinus tachycardia at 105 and no acute ST-T wave changes. Trauma imaging to include CT head, C-spine and x-rays of thoracic spine and chest x-ray mostly nonacute though chest x-ray does show streaky atelectasis versus minimal pneumonia at left base. CT angio chest negative for PE or aortic dissection and shows the following: IMPRESSION: 1. Negative for acute pulmonary embolus or aortic dissection. 2. Trace pleural effusions. Mild diffuse bilateral septal thickening and minimal hazy/ground-glass densities  suggestive of mild edema.  Patient treated with a 2 L IV fluid bolus.  Observation requested for persistent tachycardia    Past Medical History:  Diagnosis Date   Coronary artery disease    Diabetes mellitus without complication ([place])    insulin pump   Headache    since ear problem started   Hypertension    Myocardial infarction ([place]) 11/01/13   Past Surgical History:  Procedure Laterality Date   ABDOMINAL HYSTERECTOMY     BACK SURGERY     tail bone removed after fall/fracture   CATARACT EXTRACTION BILATERAL W/ ANTERIOR VITRECTOMY     CORONARY ANGIOPLASTY WITH STENT PLACEMENT  11/01/13   [facility], stent x2   HIP PINNING,CANNULATED Right 01/07/2019   Procedure: CANNULATED HIP PINNING;  Surgeon: Corky Mull, MD;  Location: [facility];  Service: Orthopedics;  Laterality: Right;   LEFT HEART CATH AND CORONARY ANGIOGRAPHY N/A 08/14/2016   Procedure: Left Heart Cath and Coronary Angiography;  Surgeon: Minna Merritts, MD;  Location: [facility];  Service: Cardiovascular;  Laterality: N/A;   MELANOMA EXCISION Right    arm with skin graft   MYRINGOTOMY WITH TUBE PLACEMENT Left 08/21/2015   Procedure: MYRINGOTOMY WITH TUBE PLACEMENT;  Surgeon: Carloyn Manner, MD;  Location: [place];  Service: ENT;  Laterality: Left;   NASOPHARYNGEAL BIOPSY N/A 11/07/2015   Procedure: NASOPHARYNGEAL BIOPSY;  Surgeon: Carloyn Manner, MD;  Location: [facility];  Service: ENT;  Laterality: N/A;   TONSILLECTOMY     Social History:  reports that she quit smoking about 23 years ago. Her smoking use  included cigarettes. She has never used smokeless tobacco. She reports current alcohol use of about 5.0 standard drinks of alcohol per week. She reports that she does not use drugs.  Allergies  Allergen Reactions   Bupropion     Other reaction(s): Other (See Comments) Constipation    Percocet [Oxycodone-Acetaminophen] Nausea And Vomiting   Statins     Joint pain   Sulfa Antibiotics  Nausea And Vomiting    Family History  Problem Relation Age of Onset   Lung cancer Father    Breast cancer Maternal Grandfather     Prior to Admission medications   Medication Sig Start Date End Date Taking? Authorizing Provider  acetaminophen (TYLENOL) 325 MG tablet Take 2 tablets (650 mg total) by mouth every 6 (six) hours as needed for mild pain, fever or headache (pain score 1-3 or temp > 100.5). 01/10/19   Gladstone Lighter, MD  alendronate (FOSAMAX) 70 MG tablet Take 70 mg by mouth once a week. 11/23/20   [provider]  aspirin 81 MG tablet Take 81 mg by mouth daily.    [provider]  cyanocobalamin (,VITAMIN B-12,) 1000 MCG/ML injection Inject 1,000 mcg into the muscle every 30 (thirty) days. 10/05/20   [provider]  docusate sodium (COLACE) 100 MG capsule Take 1 capsule (100 mg total) by mouth 2 (two) times daily. 01/10/19   Gladstone Lighter, MD  FLUoxetine (PROZAC) 40 MG capsule Take 40 mg by mouth every evening.     [provider]  Glucagon, rDNA, (GLUCAGON EMERGENCY IJ) Inject as directed as needed (for low blood sugar).    [provider]  insulin glargine (LANTUS) 100 UNIT/ML injection Inject 13 Units into the skin daily as needed (If pump does not lower blood sugars). As needed if insulin pump is not working     [provider]  insulin lispro (HUMALOG) 100 UNIT/ML injection Inject 0-23 Units into the skin daily. VIA PUMP 09/29/18   [provider]  rosuvastatin (CRESTOR) 10 MG tablet Take 10 mg by mouth at bedtime. 12/05/18   [provider]    Physical Exam: Vitals:   10/10/21 2200 10/11/21 0000 10/11/21 0030 10/11/21 0100  BP: 127/67 (!) 145/81 139/83 137/76  Pulse: (!) 118 (!) 124 (!) 123 (!) 123  Resp:  (!) 23 20 (!) 24  Temp:      TempSrc:      SpO2: 93% 91% 91% (!) 86%  Weight:      Height:       Physical Exam Vitals and nursing note reviewed.  Constitutional:      General: She is  not in acute distress.    Comments: Frail-appearing elderly female in no distress  HENT:     Head: Normocephalic and atraumatic.  Cardiovascular:     Rate and Rhythm: Normal rate and regular rhythm.     Heart sounds: Normal heart sounds.  Pulmonary:     Effort: Pulmonary effort is normal.     Breath sounds: Normal breath sounds.  Abdominal:     Palpations: Abdomen is soft.     Tenderness: There is no abdominal tenderness.  Neurological:     Mental Status: Mental status is at baseline.     Labs on Admission: I have personally reviewed following labs and imaging studies  CBC: Recent Labs  Lab 10/10/21 1456  WBC 4.4  HGB 12.4  HCT 38.5  MCV 97.0  PLT 778   Basic Metabolic Panel: Recent Labs  Lab 10/10/21  1456  NA 138  K 3.9  CL 103  CO2 27  GLUCOSE 149*  BUN 14  CREATININE 0.98  CALCIUM 8.7*   GFR: Estimated Creatinine Clearance: 38.6 mL/min (by C-G formula based on SCr of 0.98 mg/dL). Liver Function Tests: No results for input(s): "AST", "ALT", "ALKPHOS", "BILITOT", "PROT", "ALBUMIN" in the last 168 hours. No results for input(s): "LIPASE", "AMYLASE" in the last 168 hours. No results for input(s): "AMMONIA" in the last 168 hours. Coagulation Profile: No results for input(s): "INR", "PROTIME" in the last 168 hours. Cardiac Enzymes: No results for input(s): "CKTOTAL", "CKMB", "CKMBINDEX", "TROPONINI" in the last 168 hours. BNP (last 3 results) No results for input(s): "PROBNP" in the last 8760 hours. HbA1C: No results for input(s): "HGBA1C" in the last 72 hours. CBG: Recent Labs  Lab 10/10/21 1737  GLUCAP 168*   Lipid Profile: No results for input(s): "CHOL", "HDL", "LDLCALC", "TRIG", "CHOLHDL", "LDLDIRECT" in the last 72 hours. Thyroid Function Tests: Recent Labs    10/10/21 2336  TSH 0.777  FREET4 1.15*   Anemia Panel: No results for input(s): "VITAMINB12", "FOLATE", "FERRITIN", "TIBC", "IRON", "RETICCTPCT" in the last 72 hours. Urine  analysis:    Component Value Date/Time   COLORURINE AMBER (A) 10/10/2021 2105   APPEARANCEUR CLOUDY (A) 10/10/2021 2105   LABSPEC 1.012 10/10/2021 2105   PHURINE 5.0 10/10/2021 2105   GLUCOSEU 50 (A) 10/10/2021 2105   HGBUR NEGATIVE 10/10/2021 2105   BILIRUBINUR NEGATIVE 10/10/2021 2105   KETONESUR 5 (A) 10/10/2021 2105   PROTEINUR NEGATIVE 10/10/2021 2105   NITRITE NEGATIVE 10/10/2021 2105   LEUKOCYTESUR TRACE (A) 10/10/2021 2105    Radiological Exams on Admission: CT Angio Chest PE W and/or Wo Contrast  Result Date: 10/11/2021 CLINICAL DATA:  Multiple syncopal episodes positive D-dimer EXAM: CT ANGIOGRAPHY CHEST WITH CONTRAST TECHNIQUE: Multidetector CT imaging of the chest was performed using the standard protocol during bolus administration of intravenous contrast. Multiplanar CT image reconstructions and MIPs were obtained to evaluate the vascular anatomy. RADIATION DOSE REDUCTION: This exam was performed according to the departmental dose-optimization program which includes automated exposure control, adjustment of the mA and/or kV according to patient size and/or use of iterative reconstruction technique. CONTRAST:  53m OMNIPAQUE IOHEXOL 350 MG/ML SOLN COMPARISON:  Chest x-ray 10/10/2021 FINDINGS: Cardiovascular: Satisfactory opacification of the pulmonary arteries to the segmental level. No evidence of pulmonary embolism. Nonaneurysmal aorta. Mild atherosclerosis. No dissection is seen. Coronary vascular calcification. Normal cardiac size. No pericardial effusion. Mediastinum/Nodes: Midline trachea. No thyroid mass. No suspicious lymph nodes. Esophagus within normal limits. Lungs/Pleura: Trace pleural effusions. Dependent atelectasis at the bases. Mild diffuse bilateral septal thickening and minimal ground-glass densities. Upper Abdomen: No acute abnormality. Musculoskeletal: No chest wall abnormality. No acute or significant osseous findings. Review of the MIP images confirms the above  findings. IMPRESSION: 1. Negative for acute pulmonary embolus or aortic dissection. 2. Trace pleural effusions. Mild diffuse bilateral septal thickening and minimal hazy/ground-glass densities suggestive of mild edema. Aortic Atherosclerosis (ICD10-I70.0). Electronically Signed   By: KDonavan FoilM.D.   On: 10/11/2021 01:01   DG Chest Portable 1 View  Result Date: 10/10/2021 CLINICAL DATA:  Shortness of breath EXAM: PORTABLE CHEST 1 VIEW COMPARISON:  11/07/2015 FINDINGS: Streaky atelectasis versus minimal pneumonia at the left base. No pleural effusion. Normal cardiac size. No pneumothorax. There are bronchitic changes IMPRESSION: Streaky atelectasis versus mild pneumonia at the left base. Electronically Signed   By: KDonavan FoilM.D.   On: 10/10/2021 23:15   DG  Thoracic Spine 2 View  Result Date: 10/10/2021 CLINICAL DATA:  Fall with upper back pain EXAM: THORACIC SPINE 2 VIEWS COMPARISON:  None Available. FINDINGS: Mild dextrocurvature. Sagittal alignment within normal limits. Vertebral body heights are maintained. There are mild degenerative osteophytes. IMPRESSION: Mild dextrocurvature and degenerative change. No acute osseous abnormality. Electronically Signed   By: Donavan Foil M.D.   On: 10/10/2021 19:12   CT CERVICAL SPINE WO CONTRAST  Result Date: 10/10/2021 CLINICAL DATA:  Status post fall with head trauma. Syncopal episodes. EXAM: CT CERVICAL SPINE WITHOUT CONTRAST TECHNIQUE: Multidetector CT imaging of the cervical spine was performed without intravenous contrast. Multiplanar CT image reconstructions were also generated. RADIATION DOSE REDUCTION: This exam was performed according to the departmental dose-optimization program which includes automated exposure control, adjustment of the mA and/or kV according to patient size and/or use of iterative reconstruction technique. COMPARISON:  CT cervical spine 04/04/2019. FINDINGS: Alignment: Normal. Skull base and vertebrae: No evidence of acute  cervical spine fracture or traumatic subluxation. Soft tissues and spinal canal: No prevertebral fluid or swelling. No visible canal hematoma. Disc levels: Stable multilevel spondylosis with disc space narrowing, uncinate spurring and mild facet hypertrophy. No large disc herniation or high-grade spinal stenosis. Upper chest: Clear lung apices. Other: Bilateral carotid atherosclerosis. Stable chronic left mastoid effusion. IMPRESSION: 1. No evidence of acute cervical spine fracture, traumatic subluxation or static signs of instability. 2. Stable spondylosis. Electronically Signed   By: Richardean Sale M.D.   On: 10/10/2021 15:31   CT HEAD WO CONTRAST  Result Date: 10/10/2021 CLINICAL DATA:  Head trauma. Multiple syncopal episodes with dizziness. Head injury this morning. EXAM: CT HEAD WITHOUT CONTRAST TECHNIQUE: Contiguous axial images were obtained from the base of the skull through the vertex without intravenous contrast. RADIATION DOSE REDUCTION: This exam was performed according to the departmental dose-optimization program which includes automated exposure control, adjustment of the mA and/or kV according to patient size and/or use of iterative reconstruction technique. COMPARISON:  CT head 04/04/2019. FINDINGS: Brain: There is no evidence of acute intracranial hemorrhage, mass lesion, brain edema or extra-axial fluid collection. The ventricles and subarachnoid spaces are appropriately sized for age. There is no CT evidence of acute cortical infarction. Prominent low-density in the periventricular and subcortical white matter is similar to the previous study and most consistent with chronic small vessel ischemic changes. Vascular: Prominent intracranial vascular calcifications. No hyperdense vessel identified. Skull: Negative for fracture or focal lesion. Sinuses/Orbits: Stable left mastoid effusion without evidence of underlying skull base fracture. Fluid or cerumen in the left external auditory canal. The  middle ears, right mastoid air cells and paranasal sinuses are clear without air-fluid levels. Other: No significant recurrent soft tissue injury identified in the scalp. IMPRESSION: 1. No acute intracranial findings. 2. Stable chronic small vessel ischemic changes in the periventricular and subcortical white matter. 3. Chronic left mastoid effusion. Electronically Signed   By: Richardean Sale M.D.   On: 10/10/2021 15:27     Data Reviewed: Relevant notes from primary care and specialist visits, past discharge summaries as available in EHR, including Care Everywhere. Prior diagnostic testing as pertinent to current admission diagnoses Updated medications and problem lists for reconciliation ED course, including vitals, labs, imaging, treatment and response to treatment Triage notes, nursing and pharmacy notes and ED provider's notes Notable results as noted in HPI   Assessment and Plan: * Sinus tachycardia Suspect related to acute condition Continue IV hydration Had slight elevation in free T4 but with normal TSH  Low-dose metoprolol added Consider cardiology consult if persistent  Syncope Suspect vasovagal, mild hypovolemia Continue IV hydration Cardiac monitoring Will hold off on further syncope work-up  Vomiting and diarrhea IV hydration, IV antiemetics and supportive care Clear liquid diet advance as tolerated Stool studies  Coronary artery disease involving native coronary artery of native heart No complaints of chest pain Continue aspirin and rosuvastatin We will add metoprolol  Type 1 diabetes mellitus ([place]) Patient uses an insulin pump Sliding scale insulin tonight        DVT prophylaxis: Lovenox  Consults: none  Advance Care Planning:   Code Status: Prior   Family Communication: none  Disposition Plan: Back to previous home environment  Severity of Illness: The appropriate patient status for this patient is OBSERVATION. Observation status is judged to  be reasonable and necessary in order to provide the required intensity of service to ensure the patient's safety. The patient's presenting symptoms, physical exam findings, and initial radiographic and laboratory data in the context of their medical condition is felt to place them at decreased risk for further clinical deterioration. Furthermore, it is anticipated that the patient will be medically stable for discharge from the hospital within 2 midnights of admission.   Author: Athena Masse, MD 10/11/2021 2:58 AM  For on call review [email or web address].

## 2021-10-11 NOTE — ED Notes (Signed)
Pt's daughter [name] updated on her admission to the hospital.

## 2021-10-11 NOTE — Assessment & Plan Note (Signed)
Suspect vasovagal, mild hypovolemia Continue IV hydration Cardiac monitoring Will hold off on further syncope work-up

## 2021-10-11 NOTE — Assessment & Plan Note (Addendum)
IV hydration, IV antiemetics and supportive care Clear liquid diet advance as tolerated Stool studies

## 2021-10-11 NOTE — ED Notes (Signed)
Went into room because pt was calling out for RN. Pt was out of bed with purewick suction detached and oxygen off. Pt stated her breakfast had fallen to the floor. Changed bedsheet and helped pt back into bed and reattached purewick suction, oxygen and leads. Instructed pt to use call bell. Bed alarm turned on.

## 2021-10-11 NOTE — Assessment & Plan Note (Addendum)
Patient uses an insulin pump Sliding scale insulin tonight

## 2021-10-11 NOTE — Progress Notes (Signed)
This is a no charge noticed patient was admitted this AM.  Patient seen and examined H&P reviewed. Diana Frost is a 76 y.o. female with medical history significant for Type 1 diabetes on an insulin pump, hypertension, CAD who was brought to the ED after 2 syncopal episodes at home.  Patient has a history of intermittent vomiting but over the past 2 weeks she has had an increased frequency of vomiting.  They have been nonbloody and nonbilious.  Additionally she has had several episodes of nonbloody and nonmelanotic diarrhea.  She denies abdominal pain, fever or chills.  She has had no chest pain, shortness of breath or lightheadedness. ED course and data review: On arrival, afebrile.  During several hours in the emergency room patient has been persistently tachycardic in spite of IV fluid resuscitation with heart rate mostly in the 120s.  Also tachypneic to 20-24.  O2 sat initially 96% on room air but downtrending to 86% by the time of admission Labs: D-dimer 2.38 and troponin to CBC and BMP essentially unremarkable.  Urinalysis with 5 ketones.  COVID-negative.  TSH normal at 0.777 but free T4 slightly elevated at 1.15.  Lactic acid normal. Daughter stated has been having nausea since she was started on depression pills 2 weeks ago.  More so than usual.  Orthostatics as follows Lying: 104/67 (MAP 78), Sitting 87/59 (MAP 70) and standing 88/53 (MAP 65)   Cta nad Reg s1/s2  Soft benign No edema   A/P  On standing bp drops to sbp.  Will hold beta blk Start IVF for hydration ECS

## 2021-10-11 NOTE — Assessment & Plan Note (Signed)
Suspect related to acute condition Continue IV hydration Had slight elevation in free T4 but with normal TSH Low-dose metoprolol added Consider cardiology consult if persistent

## 2021-10-11 NOTE — Assessment & Plan Note (Signed)
No complaints of chest pain Continue aspirin and rosuvastatin We will add metoprolol

## 2021-10-12 DIAGNOSIS — R000 Tachycardia, unspecified: Secondary | ICD-10-CM | POA: Diagnosis not present

## 2021-10-12 LAB — URINALYSIS, COMPLETE (UACMP) WITH MICROSCOPIC
Bilirubin Urine: NEGATIVE
Glucose, UA: >=500 mg/dL — AB
Hgb urine dipstick: NEGATIVE
Ketones, ur: 80 mg/dL — AB
Nitrite: NEGATIVE
Protein, ur: NEGATIVE mg/dL
Specific Gravity, Urine: 1.016 (ref 1.005–1.030)
WBC, UA: >50 WBC/hpf — ABNORMAL HIGH (ref 0–5)
pH: 5 (ref 5.0–8.0)

## 2021-10-12 LAB — GLUCOSE, CAPILLARY
Glucose-Capillary: 295 mg/dL — ABNORMAL HIGH (ref 70–99)
Glucose-Capillary: 358 mg/dL — ABNORMAL HIGH (ref 70–99)
Glucose-Capillary: 193 mg/dL — ABNORMAL HIGH (ref 70–99)
Glucose-Capillary: 89 mg/dL (ref 70–99)
Glucose-Capillary: 211 mg/dL — ABNORMAL HIGH (ref 70–99)

## 2021-10-12 LAB — CBC
HCT: 35.7 % — ABNORMAL LOW (ref 36.0–46.0)
Hemoglobin: 11.4 g/dL — ABNORMAL LOW (ref 12.0–15.0)
MCH: 31.4 pg (ref 26.0–34.0)
MCHC: 31.9 g/dL (ref 30.0–36.0)
MCV: 98.3 fL (ref 80.0–100.0)
Platelets: 176 10*3/uL (ref 150–400)
RBC: 3.63 MIL/uL — ABNORMAL LOW (ref 3.87–5.11)
RDW: 13 % (ref 11.5–15.5)
WBC: 7.4 10*3/uL (ref 4.0–10.5)
nRBC: 0 % (ref 0.0–0.2)

## 2021-10-12 MED ORDER — MIDODRINE HCL 5 MG PO TABS: 2.5000 mg | ORAL_TABLET | Freq: Three times a day (TID) | ORAL | Status: DC

## 2021-10-12 MED ORDER — MIDODRINE HCL 5 MG PO TABS
5.0000 mg | ORAL_TABLET | Freq: Three times a day (TID) | ORAL | Status: DC
  Administered 2021-10-12 – 2021-10-15 (×10): 5 mg via ORAL
  Filled 2021-10-12 (×10): qty 1

## 2021-10-12 NOTE — Progress Notes (Addendum)
PROGRESS NOTE    Diana Frost  MLY:[id number] DOB: 1946/03/04 DOA: 10/10/2021 PCP: Latanya Maudlin, NP    Brief Narrative:  Diana Frost is a 76 y.o. female with medical history significant for Type 1 diabetes on an insulin pump, hypertension, CAD who was brought to the ED after 2 syncopal episodes at home.  Patient has a history of intermittent vomiting but over the past 2 weeks she has had an increased frequency of vomiting.  They have been nonbloody and nonbilious.  Additionally she has had several episodes of nonbloody and nonmelanotic diarrhea.  She denies abdominal pain, fever or chills.  She has had no chest pain, shortness of breath or lightheadedness. ED course and data review: On arrival, afebrile.  During several hours in the emergency room patient has been persistently tachycardic in spite of IV fluid resuscitation with heart rate mostly in the 120s.  Also tachypneic to 20-24.  O2 sat initially 96% on room air but downtrending to 86% by the time of admission Labs: D-dimer 2.38 and troponin to CBC and BMP essentially unremarkable.  Urinalysis with 5 ketones.  COVID-negative.  TSH normal at 0.777 but free T4 slightly elevated at 1.15.  Lactic acid normal. EKG, personally viewed and interpreted with sinus tachycardia at 105 and no acute ST-T wave changes. Trauma imaging to include CT head, C-spine and x-rays of thoracic spine and chest x-ray mostly nonacute though chest x-ray does show streaky atelectasis versus minimal pneumonia at left base. CT angio chest negative for PE or aortic dissection and shows the following: IMPRESSION: 1. Negative for acute pulmonary embolus or aortic dissection. 2. Trace pleural effusions. Mild diffuse bilateral septal thickening and minimal hazy/ground-glass densities suggestive of mild edema.    7/9 denies sob, cp. Has no new complaints.   Consultants:    Procedures:   Antimicrobials:      Subjective: Nausea  resolved  Objective: Vitals:   10/12/21 0748 10/12/21 0952 10/12/21 1150 10/12/21 1154  BP: (!) 113/50 107/61 (!) 98/56   Pulse: (!) 114 (!) 116 (!) 101 100  Resp: '18 16 17   '$ Temp: 99.5 F (37.5 C) 99 F (37.2 C) 98.5 F (36.9 C)   TempSrc:      SpO2: 96% 94% 95% 96%  Weight:      Height:        Intake/Output Summary (Last 24 hours) at 10/12/2021 1248 Last data filed at 10/11/2021 2235 Gross per 24 hour  Intake 1478.92 ml  Output --  Net 1478.92 ml   Filed Weights   10/10/21 1451  Weight: 59 kg    Examination: Calm, NAD Cta no w/r Reg s1/s2 no gallop Soft benign +bs No edema Grossly intact Mood and affect appropriate in current setting     Data Reviewed: I have personally reviewed following labs and imaging studies  CBC: Recent Labs  Lab 10/10/21 1456 10/11/21 0737 10/12/21 0856  WBC 4.4 10.6* 7.4  HGB 12.4 11.7* 11.4*  HCT 38.5 36.7 35.7*  MCV 97.0 97.1 98.3  PLT 188 179 812   Basic Metabolic Panel: Recent Labs  Lab 10/10/21 1456 10/11/21 0737  NA 138  --   K 3.9 4.5  CL 103  --   CO2 27  --   GLUCOSE 149*  --   BUN 14  --   CREATININE 0.98 0.77  CALCIUM 8.7*  --    GFR: Estimated Creatinine Clearance: 47.3 mL/min (by C-G formula based on SCr of 0.77 mg/dL). Liver Function  Tests: No results for input(s): "AST", "ALT", "ALKPHOS", "BILITOT", "PROT", "ALBUMIN" in the last 168 hours. No results for input(s): "LIPASE", "AMYLASE" in the last 168 hours. No results for input(s): "AMMONIA" in the last 168 hours. Coagulation Profile: No results for input(s): "INR", "PROTIME" in the last 168 hours. Cardiac Enzymes: No results for input(s): "CKTOTAL", "CKMB", "CKMBINDEX", "TROPONINI" in the last 168 hours. BNP (last 3 results) No results for input(s): "PROBNP" in the last 8760 hours. HbA1C: Recent Labs    10/11/21 0737  HGBA1C 7.2*   CBG: Recent Labs  Lab 10/11/21 1703 10/11/21 1935 10/12/21 0120 10/12/21 0741 10/12/21 1147  GLUCAP 78  150* 295* 358* 193*   Lipid Profile: No results for input(s): "CHOL", "HDL", "LDLCALC", "TRIG", "CHOLHDL", "LDLDIRECT" in the last 72 hours. Thyroid Function Tests: Recent Labs    10/10/21 2336  TSH 0.777  FREET4 1.15*   Anemia Panel: No results for input(s): "VITAMINB12", "FOLATE", "FERRITIN", "TIBC", "IRON", "RETICCTPCT" in the last 72 hours. Sepsis Labs: Recent Labs  Lab 10/10/21 2336 10/11/21 0141  LATICACIDVEN 2.3* 1.1    Recent Results (from the past 240 hour(s))  SARS Coronavirus 2 by RT PCR (hospital order, performed in [facility] hospital lab) *cepheid single result test* Anterior Nasal Swab     Status: None   Collection Time: 10/10/21 11:26 PM   Specimen: Anterior Nasal Swab  Result Value Ref Range Status   SARS Coronavirus 2 by RT PCR NEGATIVE NEGATIVE Final    Comment: (NOTE) SARS-CoV-2 target nucleic acids are NOT DETECTED.  The SARS-CoV-2 RNA is generally detectable in upper and lower respiratory specimens during the acute phase of infection. The lowest concentration of SARS-CoV-2 viral copies this assay can detect is 250 copies / mL. A negative result does not preclude SARS-CoV-2 infection and should not be used as the sole basis for treatment or other patient management decisions.  A negative result may occur with improper specimen collection / handling, submission of specimen other than nasopharyngeal swab, presence of viral mutation(s) within the areas targeted by this assay, and inadequate number of viral copies (<250 copies / mL). A negative result must be combined with clinical observations, patient history, and epidemiological information.  Fact Sheet for Patients:   https://www.patel.info/  Fact Sheet for Healthcare Providers: https://hall.com/  This test is not yet approved or  cleared by the [place] FDA and has been authorized for detection and/or diagnosis of SARS-CoV-2 by FDA under an  Emergency Use Authorization (EUA).  This EUA will remain in effect (meaning this test can be used) for the duration of the COVID-19 declaration under Section 564(b)(1) of the Act, 21 U.S.C. section 360bbb-3(b)(1), unless the authorization is terminated or revoked sooner.  Performed at [facility], [address] [place], [place] [address]          Radiology Studies: CT Angio Chest PE W and/or Wo Contrast  Result Date: 10/11/2021 CLINICAL DATA:  Multiple syncopal episodes positive D-dimer EXAM: CT ANGIOGRAPHY CHEST WITH CONTRAST TECHNIQUE: Multidetector CT imaging of the chest was performed using the standard protocol during bolus administration of intravenous contrast. Multiplanar CT image reconstructions and MIPs were obtained to evaluate the vascular anatomy. RADIATION DOSE REDUCTION: This exam was performed according to the departmental dose-optimization program which includes automated exposure control, adjustment of the mA and/or kV according to patient size and/or use of iterative reconstruction technique. CONTRAST:  20m OMNIPAQUE IOHEXOL 350 MG/ML SOLN COMPARISON:  Chest x-ray 10/10/2021 FINDINGS: Cardiovascular: Satisfactory opacification of the pulmonary arteries  to the segmental level. No evidence of pulmonary embolism. Nonaneurysmal aorta. Mild atherosclerosis. No dissection is seen. Coronary vascular calcification. Normal cardiac size. No pericardial effusion. Mediastinum/Nodes: Midline trachea. No thyroid mass. No suspicious lymph nodes. Esophagus within normal limits. Lungs/Pleura: Trace pleural effusions. Dependent atelectasis at the bases. Mild diffuse bilateral septal thickening and minimal ground-glass densities. Upper Abdomen: No acute abnormality. Musculoskeletal: No chest wall abnormality. No acute or significant osseous findings. Review of the MIP images confirms the above findings. IMPRESSION: 1. Negative for acute pulmonary embolus or aortic dissection. 2. Trace  pleural effusions. Mild diffuse bilateral septal thickening and minimal hazy/ground-glass densities suggestive of mild edema. Aortic Atherosclerosis (ICD10-I70.0). Electronically Signed   By: Donavan Foil M.D.   On: 10/11/2021 01:01   DG Chest Portable 1 View  Result Date: 10/10/2021 CLINICAL DATA:  Shortness of breath EXAM: PORTABLE CHEST 1 VIEW COMPARISON:  11/07/2015 FINDINGS: Streaky atelectasis versus minimal pneumonia at the left base. No pleural effusion. Normal cardiac size. No pneumothorax. There are bronchitic changes IMPRESSION: Streaky atelectasis versus mild pneumonia at the left base. Electronically Signed   By: Donavan Foil M.D.   On: 10/10/2021 23:15   DG Thoracic Spine 2 View  Result Date: 10/10/2021 CLINICAL DATA:  Fall with upper back pain EXAM: THORACIC SPINE 2 VIEWS COMPARISON:  None Available. FINDINGS: Mild dextrocurvature. Sagittal alignment within normal limits. Vertebral body heights are maintained. There are mild degenerative osteophytes. IMPRESSION: Mild dextrocurvature and degenerative change. No acute osseous abnormality. Electronically Signed   By: Donavan Foil M.D.   On: 10/10/2021 19:12   CT CERVICAL SPINE WO CONTRAST  Result Date: 10/10/2021 CLINICAL DATA:  Status post fall with head trauma. Syncopal episodes. EXAM: CT CERVICAL SPINE WITHOUT CONTRAST TECHNIQUE: Multidetector CT imaging of the cervical spine was performed without intravenous contrast. Multiplanar CT image reconstructions were also generated. RADIATION DOSE REDUCTION: This exam was performed according to the departmental dose-optimization program which includes automated exposure control, adjustment of the mA and/or kV according to patient size and/or use of iterative reconstruction technique. COMPARISON:  CT cervical spine 04/04/2019. FINDINGS: Alignment: Normal. Skull base and vertebrae: No evidence of acute cervical spine fracture or traumatic subluxation. Soft tissues and spinal canal: No prevertebral  fluid or swelling. No visible canal hematoma. Disc levels: Stable multilevel spondylosis with disc space narrowing, uncinate spurring and mild facet hypertrophy. No large disc herniation or high-grade spinal stenosis. Upper chest: Clear lung apices. Other: Bilateral carotid atherosclerosis. Stable chronic left mastoid effusion. IMPRESSION: 1. No evidence of acute cervical spine fracture, traumatic subluxation or static signs of instability. 2. Stable spondylosis. Electronically Signed   By: Richardean Sale M.D.   On: 10/10/2021 15:31   CT HEAD WO CONTRAST  Result Date: 10/10/2021 CLINICAL DATA:  Head trauma. Multiple syncopal episodes with dizziness. Head injury this morning. EXAM: CT HEAD WITHOUT CONTRAST TECHNIQUE: Contiguous axial images were obtained from the base of the skull through the vertex without intravenous contrast. RADIATION DOSE REDUCTION: This exam was performed according to the departmental dose-optimization program which includes automated exposure control, adjustment of the mA and/or kV according to patient size and/or use of iterative reconstruction technique. COMPARISON:  CT head 04/04/2019. FINDINGS: Brain: There is no evidence of acute intracranial hemorrhage, mass lesion, brain edema or extra-axial fluid collection. The ventricles and subarachnoid spaces are appropriately sized for age. There is no CT evidence of acute cortical infarction. Prominent low-density in the periventricular and subcortical white matter is similar to the previous study and most consistent  with chronic small vessel ischemic changes. Vascular: Prominent intracranial vascular calcifications. No hyperdense vessel identified. Skull: Negative for fracture or focal lesion. Sinuses/Orbits: Stable left mastoid effusion without evidence of underlying skull base fracture. Fluid or cerumen in the left external auditory canal. The middle ears, right mastoid air cells and paranasal sinuses are clear without air-fluid levels.  Other: No significant recurrent soft tissue injury identified in the scalp. IMPRESSION: 1. No acute intracranial findings. 2. Stable chronic small vessel ischemic changes in the periventricular and subcortical white matter. 3. Chronic left mastoid effusion. Electronically Signed   By: Richardean Sale M.D.   On: 10/10/2021 15:27        Scheduled Meds:  aspirin EC  81 mg Oral Daily   enoxaparin (LOVENOX) injection  40 mg Subcutaneous Q24H   insulin aspart  0-5 Units Subcutaneous QHS   insulin aspart  0-9 Units Subcutaneous TID WC   midodrine  2.5 mg Oral TID WC   rosuvastatin  10 mg Oral QHS   Continuous Infusions:  lactated ringers 75 mL/hr at 10/11/21 2235    Assessment & Plan:   Principal Problem:   Sinus tachycardia Active Problems:   Syncope   History of insertion of insulin pump   Vomiting and diarrhea   Type 1 diabetes mellitus (HCC)   Coronary artery disease involving native coronary artery of native heart   Sinus tachycardia Suspect related to acute condition 7/9 continue IV fluids  Free T4 minimally elevated, with normal TSH  Held beta-blockers due to hypotension specially upon standing      Syncope Suspect vasovagal, mild hypovolemia 7/9 her bp did drop with standing Continue ivf for hydration.   Vomiting and diarrhea Resolved. Ivf for hydration.    Coronary artery disease involving native coronary artery of native heart Continue asa and statin   Type 1 diabetes mellitus ([place]) Bg elevatedd Pump held here Riss add lantus 5units qd  Hypotension Add midodrine Ivf   DVT prophylaxis: Lovenox Code Status: DNR Family Communication: None at bedside Disposition Plan: PT consult likely home Status is: Inpatient Remains inpatient appropriate because: IV treatment.  PT consulted        LOS: 1 day   Time spent: 48mn     SNolberto Hanlon MD [facility] Pager [phone]  If 7PM-7AM, please contact night-coverage 10/12/2021, 12:48 PM

## 2021-10-12 NOTE — TOC Initial Note (Signed)
Transition of Care [facility]) - Initial/Assessment Note    Patient Details  Name: Diana Frost MRN: [id number] Date of Birth: 01/30/1946  [facility] CM/SW Contact:    Pete Pelt, RN Phone Number: 10/12/2021, 4:20 PM  Clinical Narrative:   Patient lives at home with spouse.  She states he is not able to assist her at home.  Daughter lives in [place] and works long hours.  Patient was able to drive herself to appointments until this admission, but states she is current with care.  Patient states she has home health but does not know which agency.  She will try to get information.  She states she has all needed DME at home at this time.  Patient requests TOC to return, as she is tired today.  TOC will monitor.                Expected Discharge Plan: [place] Barriers to Discharge: Continued Medical Work up   Patient Goals and CMS Choice        Expected Discharge Plan and Services Expected Discharge Plan: [place]   Discharge Planning Services: CM Consult Post Acute Care Choice: [place] arrangements for the past 2 months: [place]                                      Prior Living Arrangements/Services Living arrangements for the past 2 months: Single Family Home Lives with:: Self, Spouse Patient language and need for interpreter reviewed:: Yes Do you feel safe going back to the place where you live?: Yes      Need for Family Participation in Patient Care: Yes (Comment) Care giver support system in place?: Yes (comment) Current home services: Home PT, Home OT Criminal Activity/Legal Involvement Pertinent to Current Situation/Hospitalization: No - Comment as needed  Activities of Daily Living      Permission Sought/Granted Permission sought to share information with : Case Manager Permission granted to share information with : Yes, Verbal Permission Granted     Permission  granted to share info w AGENCY: home health agencies        Emotional Assessment Appearance:: Appears stated age Attitude/Demeanor/Rapport: Lethargic Affect (typically observed): Appropriate Orientation: : Oriented to Self, Oriented to Place, Oriented to  Time, Oriented to Situation Alcohol / Substance Use: Not Applicable Psych Involvement: No (comment)  Admission diagnosis:  Syncope and collapse [R55] Dehydration [E86.0] Syncope [R55] Patient Active Problem List   Diagnosis Date Noted   Syncope 10/11/2021   Sinus tachycardia 10/11/2021   Vomiting and diarrhea 10/11/2021   Closed right hip fracture, initial encounter ([place]) 01/07/2019   Malnutrition of moderate degree 08/14/2016   Non-ST elevation (NSTEMI) myocardial infarction ([place]) 08/13/2016   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 [place] [address] Phone: [phone] Fax: [place], [place] - [address] AT [address] [place] [facility] [place] [address] Phone: [phone] Fax: [phone]     Social Determinants of Health (SDOH) Interventions    Readmission Risk Interventions     No data to display

## 2021-10-12 NOTE — Progress Notes (Signed)
   10/12/21 0748  Assess: MEWS Score  Temp 99.5 F (37.5 C)  BP (!) 113/50  MAP (mmHg) 68  Pulse Rate (!) 114  Resp 18  SpO2 96 %  O2 Device Room Air  Assess: MEWS Score  MEWS Temp 0  MEWS Systolic 0  MEWS Pulse 2  MEWS RR 0  MEWS LOC 0  MEWS Score 2  MEWS Score Color Yellow  Assess: if the MEWS score is Yellow or Red  Were vital signs taken at a resting state? Yes  Focused Assessment No change from prior assessment  Does the patient meet 2 or more of the SIRS criteria? No  MEWS guidelines implemented *See Row Information* Yes  Treat  MEWS Interventions Other (Comment) (Reached out via secure chat ot Nolberto Hanlon, MD about elevated heart rate)  Pain Scale 0-10  Pain Score 0  Take Vital Signs  Increase Vital Sign Frequency  Yellow: Q 2hr X 2 then Q 4hr X 2, if remains yellow, continue Q 4hrs  Escalate  MEWS: Escalate Yellow: discuss with charge nurse/RN and consider discussing with provider and RRT  Notify: Charge Nurse/RN  Name of Charge Nurse/RN Notified Lucious Groves, RN  Date Charge Nurse/RN Notified 10/12/21  Time Charge Nurse/RN Notified 0750  Notify: Provider  Provider Name/Title Nolberto Hanlon  Date Provider Notified 10/12/21  Time Provider Notified [phone]  Method of Notification  (Secure Chat)  Notification Reason Other (Comment) (Elevated Heart Rate)  Provider response No new orders  Date of Provider Response 10/12/21  Time of Provider Response [phone]  Document  Patient Outcome Other (Comment) (Continue to monitor)  Assess: SIRS CRITERIA  SIRS Temperature  0  SIRS Pulse 1  SIRS Respirations  0  SIRS WBC 0  SIRS Score Sum  1

## 2021-10-12 NOTE — Evaluation (Signed)
Physical Therapy Evaluation Patient Details Name: Diana Frost MRN: [id number] DOB: [birth year] Today's Date: 10/12/2021  History of Present Illness  Pt is a 76 y/o F admitted on 10/10/21 after being brought in with c/o 2 syncopal episodes at home. Pt is being treated for sinus tachycardia. PMH: DM2 on insulin pump, CAD, MI  Clinical Impression  Pt seen for PT evaluation with pt reporting she is ambulatory with AD but endorses multiple falls in the past 6 months. Pt lives with her husband who uses a [employer] fast streamer & has a personal care aide but she cares for him as well. Pt is able to complete bed mobility without physical assistance & transfer to standing EOB but due to + orthostatic hypotension without symptoms, pt instructed to return to supine. Will continue to follow pt acutely to address balance, safety, and gait with LRAD.  BP checked in LUE: Supine: 105/65 mmHg (MAP 77), HR 97 bpm Sitting: 91/51 mmHg (MAP 64), HR 99 bpm Standing at 0: 74/47 mmHg (MAP 57), HR 105 bpm Returned to supine: 111/62 mmHg (MAP 77), HR 101 bpm      Recommendations for follow up therapy are one component of a multi-disciplinary discharge planning process, led by the attending physician.  Recommendations may be updated based on patient status, additional functional criteria and insurance authorization.  Follow Up Recommendations Home health PT      Assistance Recommended at Discharge Frequent or constant Supervision/Assistance  Patient can return home with the following  A little help with walking and/or transfers;A little help with bathing/dressing/bathroom;Assistance with cooking/housework;Assist for transportation;Direct supervision/assist for medications management    Equipment Recommendations None recommended by PT  Recommendations for Other Services       Functional Status Assessment Patient has had a recent decline in their functional status and demonstrates the ability to make significant  improvements in function in a reasonable and predictable amount of time.     Precautions / Restrictions Precautions Precautions: Fall Restrictions Weight Bearing Restrictions: No      Mobility  Bed Mobility Overal bed mobility: Needs Assistance Bed Mobility: Sit to Supine, Supine to Sit     Supine to sit: Supervision, HOB elevated Sit to supine: Supervision, HOB elevated   General bed mobility comments: use of bed rails PRN    Transfers Overall transfer level: Needs assistance Equipment used: None Transfers: Sit to/from Stand Sit to Stand: Min guard                Ambulation/Gait                  Stairs            Wheelchair Mobility    Modified Rankin (Stroke Patients Only)       Balance Overall balance assessment: Needs assistance, History of Falls Sitting-balance support: Feet supported Sitting balance-Leahy Scale: Good     Standing balance support: No upper extremity supported Standing balance-Leahy Scale: Fair                               Pertinent Vitals/Pain Pain Assessment Pain Assessment: Faces Faces Pain Scale: Hurts little more Pain Location: upper legs, back, shoulders, neck Pain Descriptors / Indicators: Sore Pain Intervention(s): Monitored during session    Home Living Family/patient expects to be discharged to:: Private residence Living Arrangements: Children Available Help at Discharge: Family;Available PRN/intermittently (daughter to start working at summer school this coming Monday so unsure  of how much assistance pt can receive from family/friends upon d/c) Type of Home: House Home Access: Ramped entrance       Home Layout: Two level;Able to live on main level with bedroom/bathroom Home Equipment: Rolling Walker (2 wheels);Rollator (4 wheels);Cane - single point      Prior Function Prior Level of Function : Needs assist;History of Falls (last six months)             Mobility Comments: Pt  reports she's not allowed to drive. Pt reports she primarily uses a rollator in the home, RW outside of the home, recently started receiving HHPT services. Pt lives with her husband who she cares for (he requires hoyer lift to transfer OOB to w/c, he also has a personal care aide himself). Pt endorses 4-5 falls in the past 6 months with last one occurring the morning she presented to the hospital.       Hand Dominance        Extremity/Trunk Assessment   Upper Extremity Assessment Upper Extremity Assessment: Overall WFL for tasks assessed    Lower Extremity Assessment Lower Extremity Assessment: Generalized weakness    Cervical / Trunk Assessment Cervical / Trunk Assessment: Normal  Communication   Communication: No difficulties  Cognition Arousal/Alertness: Awake/alert (somewhat lethargic/sleepy though) Behavior During Therapy: WFL for tasks assessed/performed Overall Cognitive Status: Within Functional Limits for tasks assessed                                          General Comments      Exercises     Assessment/Plan    PT Assessment Patient needs continued PT services  PT Problem List Decreased activity tolerance;Decreased mobility;Decreased safety awareness;Decreased balance;Decreased knowledge of use of DME       PT Treatment Interventions DME instruction;Therapeutic exercise;Gait training;Balance training;Neuromuscular re-education;Functional mobility training;Therapeutic activities;Patient/family education    PT Goals (Current goals can be found in the Care Plan section)  Acute Rehab PT Goals Patient Stated Goal: get better PT Goal Formulation: With patient Time For Goal Achievement: 10/26/21 Potential to Achieve Goals: Good    Frequency Min 2X/week     Co-evaluation               AM-PAC PT "6 Clicks" Mobility  Outcome Measure Help needed turning from your back to your side while in a flat bed without using bedrails?: None Help  needed moving from lying on your back to sitting on the side of a flat bed without using bedrails?: None Help needed moving to and from a bed to a chair (including a wheelchair)?: A Little Help needed standing up from a chair using your arms (e.g., wheelchair or bedside chair)?: A Little Help needed to walk in hospital room?: A Little Help needed climbing 3-5 steps with a railing? : A Little 6 Click Score: 20    End of Session   Activity Tolerance: Treatment limited secondary to medical complications (Comment) (limited 2/2 orthostatic hypotension) Patient left: in bed;with call bell/phone within reach;with bed alarm set Nurse Communication: Mobility status (BP) PT Visit Diagnosis: Unsteadiness on feet (R26.81);History of falling (Z91.81);Muscle weakness (generalized) (M62.81)    Time: 8119-1478 PT Time Calculation (min) (ACUTE ONLY): 16 min   Charges:   PT Evaluation $PT Eval Low Complexity: Oglesby, PT, DPT 10/12/21, 2:55 PM  [name] 10/12/2021, 2:52 PM

## 2021-10-12 NOTE — Progress Notes (Addendum)
Pt transported to rm 114 via stretcher. Handoff to IP RN completed.  Earleen Reaper, RN

## 2021-10-13 DIAGNOSIS — R000 Tachycardia, unspecified: Secondary | ICD-10-CM | POA: Diagnosis not present

## 2021-10-13 LAB — GLUCOSE, CAPILLARY
Glucose-Capillary: 339 mg/dL — ABNORMAL HIGH (ref 70–99)
Glucose-Capillary: 222 mg/dL — ABNORMAL HIGH (ref 70–99)
Glucose-Capillary: 109 mg/dL — ABNORMAL HIGH (ref 70–99)
Glucose-Capillary: 61 mg/dL — ABNORMAL LOW (ref 70–99)
Glucose-Capillary: 57 mg/dL — ABNORMAL LOW (ref 70–99)
Glucose-Capillary: 116 mg/dL — ABNORMAL HIGH (ref 70–99)
Glucose-Capillary: 85 mg/dL (ref 70–99)

## 2021-10-13 MED ORDER — INSULIN GLARGINE-YFGN 100 UNIT/ML ~~LOC~~ SOLN
10.0000 [IU] | Freq: Every day | SUBCUTANEOUS | Status: DC
Start: 2021-10-13 — End: 2021-10-14
  Administered 2021-10-13: 10 [IU] via SUBCUTANEOUS
  Filled 2021-10-13 (×2): qty 0.1

## 2021-10-13 MED ORDER — SODIUM CHLORIDE 0.9 % IV SOLN: INTRAVENOUS | Status: DC

## 2021-10-13 MED ORDER — INSULIN ASPART 100 UNIT/ML IJ SOLN
3.0000 [IU] | Freq: Three times a day (TID) | INTRAMUSCULAR | Status: DC
  Administered 2021-10-13: 3 [IU] via SUBCUTANEOUS
  Filled 2021-10-13: qty 1

## 2021-10-13 MED ORDER — SODIUM CHLORIDE 0.9 % IV SOLN
1.0000 g | INTRAVENOUS | Status: DC
  Administered 2021-10-13 – 2021-10-15 (×3): 1 g via INTRAVENOUS
  Filled 2021-10-13: qty 10
  Filled 2021-10-13 (×2): qty 1

## 2021-10-13 NOTE — Progress Notes (Signed)
   10/13/21 0820  Orthostatic Lying   BP- Lying 127/70  Pulse- Lying 113  Orthostatic Sitting  BP- Sitting 128/68  Pulse- Sitting 116  Orthostatic Standing at 0 minutes  BP- Standing at 0 minutes 94/59  Pulse- Standing at 0 minutes 119  Orthostatic Standing at 3 minutes  Pulse- Standing at 3 minutes 122  BP- Standing at 3 minutes 99/63

## 2021-10-13 NOTE — Progress Notes (Signed)
Patient vomited after dinner, MD notified. Patient reports no nausea or pain after vomiting, offered nausea medication, patient refused. Blood sugar rechecked after vomiting and was 61. Orange juice provided and blood sugar rechecked 20 minutes later. Blood sugar was 57, another orange juice provided.

## 2021-10-13 NOTE — TOC Progression Note (Signed)
Transition of Care [facility]) - Progression Note    Patient Details  Name: MORNA FLUD MRN: [id number] Date of Birth: Nov 04, 1945  Transition of Care [facility]) CM/SW Wise, RN Phone Number: 10/13/2021, 4:10 PM  Clinical Narrative:   RNCM received message to contact daughter for disposition after 1500 hours.  RNCM left voicemail for daughter, awaiting call back.    Expected Discharge Plan: [place] Barriers to Discharge: Continued Medical Work up  Expected Discharge Plan and Services Expected Discharge Plan: [place]   Discharge Planning Services: CM Consult Post Acute Care Choice: [place] arrangements for the past 2 months: Single Family Home                                       Social Determinants of Health (SDOH) Interventions    Readmission Risk Interventions     No data to display

## 2021-10-13 NOTE — Progress Notes (Signed)
   10/13/21 0512  Assess: MEWS Score  Temp (!) 97.5 F (36.4 C)  BP 126/61  MAP (mmHg) 81  Pulse Rate (!) 119  Resp 18  SpO2 95 %  Assess: MEWS Score  MEWS Temp 0  MEWS Systolic 0  MEWS Pulse 2  MEWS RR 0  MEWS LOC 0  MEWS Score 2  MEWS Score Color Yellow  Assess: if the MEWS score is Yellow or Red  Were vital signs taken at a resting state? Yes  Focused Assessment No change from prior assessment  Does the patient meet 2 or more of the SIRS criteria? No  MEWS guidelines implemented *See Row Information* No, previously yellow, continue vital signs every 4 hours  Treat  MEWS Interventions Other (Comment) Cabin crew alerted, will not escalate as pt was previously yellow.)  Take Vital Signs  Increase Vital Sign Frequency   (Vital sign frequency to remain following order set.)  Escalate  MEWS: Escalate Yellow: discuss with charge nurse/RN and consider discussing with provider and RRT  Notify: Charge Nurse/RN  Name of Charge Nurse/RN Notified Phyllis, RN  Date Charge Nurse/RN Notified 10/13/21  Time Charge Nurse/RN Notified [phone]  Document  Patient Outcome Other (Comment) (continue to monitor)  Progress note created (see row info) Yes  Assess: SIRS CRITERIA  SIRS Temperature  0  SIRS Pulse 1  SIRS Respirations  0  SIRS WBC 0  SIRS Score Sum  1

## 2021-10-13 NOTE — Progress Notes (Signed)
Physical Therapy Treatment Patient Details Name: Diana Frost MRN: [id number] DOB: [birth year] Today's Date: 10/13/2021   History of Present Illness Pt is a 76 y/o F admitted on 10/10/21 after being brought in with c/o 2 syncopal episodes at home. Pt is being treated for sinus tachycardia. PMH: DM2 on insulin pump, CAD, MI    PT Comments    Pt received upright in recliner agreeable to PT services. Trialing seated therex prior to orthostatics to assess if seated therex improves BP with positional changes but no gross changes in BP post therex. Pt remains positively orthostatic in standing but reports minor symptoms. Able to safely ambulate with RW ~20' in room to EOB returning to supine with all needs in place.  BP increased once in supine. Pt progressing in tolerance for OOB mobility despite remaining symptomatic subjectively and objectively. All needs in reach with d/c recs remaining appropriate.   Vitals in LUE:  BP seated: 128/76 mm Hg  BP seated post therex: 129/67 mm Hg  BP standing: 104/62 mm Hg  BP supine post treatment: 138/73 mm Hg   Recommendations for follow up therapy are one component of a multi-disciplinary discharge planning process, led by the attending physician.  Recommendations may be updated based on patient status, additional functional criteria and insurance authorization.  Follow Up Recommendations  Home health PT     Assistance Recommended at Discharge Frequent or constant Supervision/Assistance  Patient can return home with the following A little help with walking and/or transfers;A little help with bathing/dressing/bathroom;Assistance with cooking/housework;Assist for transportation;Direct supervision/assist for medications management   Equipment Recommendations  None recommended by PT    Recommendations for Other Services       Precautions / Restrictions Precautions Precautions: Fall Restrictions Weight Bearing Restrictions: No     Mobility  Bed  Mobility Overal bed mobility: Needs Assistance Bed Mobility: Sit to Supine       Sit to supine: Modified independent (Device/Increase time)     Patient Response: Cooperative  Transfers Overall transfer level: Needs assistance Equipment used: None Transfers: Sit to/from Stand Sit to Stand: Supervision                Ambulation/Gait Ambulation/Gait assistance: Supervision Gait Distance (Feet): 20 Feet Assistive device: Rolling walker (2 wheels) Gait Pattern/deviations: Step-through pattern, Decreased step length - right, Decreased step length - left           Stairs             Wheelchair Mobility    Modified Rankin (Stroke Patients Only)       Balance Overall balance assessment: Needs assistance, History of Falls Sitting-balance support: Feet supported Sitting balance-Leahy Scale: Good     Standing balance support: No upper extremity supported Standing balance-Leahy Scale: Fair                              Cognition Arousal/Alertness: Awake/alert Behavior During Therapy: WFL for tasks assessed/performed Overall Cognitive Status: Within Functional Limits for tasks assessed                                          Exercises General Exercises - Lower Extremity Ankle Circles/Pumps: AROM, Strengthening, Both, 10 reps, Seated Long Arc Quad: AROM, Strengthening, Seated, Both, 10 reps Hip Flexion/Marching: AROM, Strengthening, Both, 10 reps, Seated Other Exercises Other Exercises: Use  of supine/seated therex prior to mobility to increase HR and BP    General Comments        Pertinent Vitals/Pain Pain Assessment Pain Assessment: No/denies pain    Home Living                          Prior Function            PT Goals (current goals can now be found in the care plan section) Acute Rehab PT Goals Patient Stated Goal: get better PT Goal Formulation: With patient Time For Goal Achievement:  10/26/21 Potential to Achieve Goals: Good Progress towards PT goals: Progressing toward goals    Frequency    Min 2X/week      PT Plan Current plan remains appropriate    Co-evaluation              AM-PAC PT "6 Clicks" Mobility   Outcome Measure  Help needed turning from your back to your side while in a flat bed without using bedrails?: None Help needed moving from lying on your back to sitting on the side of a flat bed without using bedrails?: None Help needed moving to and from a bed to a chair (including a wheelchair)?: A Little Help needed standing up from a chair using your arms (e.g., wheelchair or bedside chair)?: A Little Help needed to walk in hospital room?: A Little   6 Click Score: 17    End of Session Equipment Utilized During Treatment: Gait belt Activity Tolerance: Patient tolerated treatment well Patient left: in bed;with call bell/phone within reach;with bed alarm set Nurse Communication: Mobility status PT Visit Diagnosis: Unsteadiness on feet (R26.81);History of falling (Z91.81);Muscle weakness (generalized) (M62.81)     Time: 7782-4235 PT Time Calculation (min) (ACUTE ONLY): 18 min  Charges:  $Therapeutic Exercise: 8-22 mins                    Ballard Budney M. Fairly IV, PT, DPT Physical Therapist- [facility]  10/13/2021, 2:41 PM

## 2021-10-13 NOTE — Progress Notes (Signed)
PROGRESS NOTE    Diana Frost  ZOX:[id number] DOB: 15-Aug-1945 DOA: 10/10/2021 PCP: Latanya Maudlin, NP    Brief Narrative:  Diana Frost is a 76 y.o. female with medical history significant for Type 1 diabetes on an insulin pump, hypertension, CAD who was brought to the ED after 2 syncopal episodes at home.  Patient has a history of intermittent vomiting but over the past 2 weeks she has had an increased frequency of vomiting.  They have been nonbloody and nonbilious.  Additionally she has had several episodes of nonbloody and nonmelanotic diarrhea.  She denies abdominal pain, fever or chills.  She has had no chest pain, shortness of breath or lightheadedness. ED course and data review: On arrival, afebrile.  During several hours in the emergency room patient has been persistently tachycardic in spite of IV fluid resuscitation with heart rate mostly in the 120s.  Also tachypneic to 20-24.  O2 sat initially 96% on room air but downtrending to 86% by the time of admission Labs: D-dimer 2.38 and troponin to CBC and BMP essentially unremarkable.  Urinalysis with 5 ketones.  COVID-negative.  TSH normal at 0.777 but free T4 slightly elevated at 1.15.  Lactic acid normal. EKG, personally viewed and interpreted with sinus tachycardia at 105 and no acute ST-T wave changes. Trauma imaging to include CT head, C-spine and x-rays of thoracic spine and chest x-ray mostly nonacute though chest x-ray does show streaky atelectasis versus minimal pneumonia at left base. CT angio chest negative for PE or aortic dissection and shows the following: IMPRESSION: 1. Negative for acute pulmonary embolus or aortic dissection. 2. Trace pleural effusions. Mild diffuse bilateral septal thickening and minimal hazy/ground-glass densities suggestive of mild edema.    7/9 denies sob, cp. Has no new complaints.  7/10 orthostatics improving but still + and symptomatic this am. Dizzy with standing. Resuming ivf. Ua  +  Consultants:    Procedures:   Antimicrobials:      Subjective: Gi sx improved  Objective: Vitals:   10/12/21 1619 10/12/21 1957 10/13/21 0052 10/13/21 0512  BP: 107/63 119/65 120/62 126/61  Pulse: 98 (!) 102 (!) 107 (!) 119  Resp: '17 18 18 18  '$ Temp: 99.2 F (37.3 C) 99.2 F (37.3 C) 97.6 F (36.4 C) (!) 97.5 F (36.4 C)  TempSrc: Oral Oral Oral Oral  SpO2: 96%  95% 95%  Weight:      Height:        Intake/Output Summary (Last 24 hours) at 10/13/2021 0807 Last data filed at 10/13/2021 0533 Gross per 24 hour  Intake --  Output 2101 ml  Net -2101 ml   Filed Weights   10/10/21 1451  Weight: 59 kg    Examination: Calm, NAD Cta no w/r Reg s1/s2 no gallop Soft benign +bs No edema Aaoxox3  Mood and affect appropriate in current setting     Data Reviewed: I have personally reviewed following labs and imaging studies  CBC: Recent Labs  Lab 10/10/21 1456 10/11/21 0737 10/12/21 0856  WBC 4.4 10.6* 7.4  HGB 12.4 11.7* 11.4*  HCT 38.5 36.7 35.7*  MCV 97.0 97.1 98.3  PLT 188 179 811   Basic Metabolic Panel: Recent Labs  Lab 10/10/21 1456 10/11/21 0737  NA 138  --   K 3.9 4.5  CL 103  --   CO2 27  --   GLUCOSE 149*  --   BUN 14  --   CREATININE 0.98 0.77  CALCIUM 8.7*  --  GFR: Estimated Creatinine Clearance: 47.3 mL/min (by C-G formula based on SCr of 0.77 mg/dL). Liver Function Tests: No results for input(s): "AST", "ALT", "ALKPHOS", "BILITOT", "PROT", "ALBUMIN" in the last 168 hours. No results for input(s): "LIPASE", "AMYLASE" in the last 168 hours. No results for input(s): "AMMONIA" in the last 168 hours. Coagulation Profile: No results for input(s): "INR", "PROTIME" in the last 168 hours. Cardiac Enzymes: No results for input(s): "CKTOTAL", "CKMB", "CKMBINDEX", "TROPONINI" in the last 168 hours. BNP (last 3 results) No results for input(s): "PROBNP" in the last 8760 hours. HbA1C: Recent Labs    10/11/21 0737  HGBA1C 7.2*    CBG: Recent Labs  Lab 10/12/21 0120 10/12/21 0741 10/12/21 1147 10/12/21 1617 10/12/21 2002  GLUCAP 295* 358* 193* 89 211*   Lipid Profile: No results for input(s): "CHOL", "HDL", "LDLCALC", "TRIG", "CHOLHDL", "LDLDIRECT" in the last 72 hours. Thyroid Function Tests: Recent Labs    10/10/21 2336  TSH 0.777  FREET4 1.15*   Anemia Panel: No results for input(s): "VITAMINB12", "FOLATE", "FERRITIN", "TIBC", "IRON", "RETICCTPCT" in the last 72 hours. Sepsis Labs: Recent Labs  Lab 10/10/21 2336 10/11/21 0141  LATICACIDVEN 2.3* 1.1    Recent Results (from the past 240 hour(s))  SARS Coronavirus 2 by RT PCR (hospital order, performed in [facility] hospital lab) *cepheid single result test* Anterior Nasal Swab     Status: None   Collection Time: 10/10/21 11:26 PM   Specimen: Anterior Nasal Swab  Result Value Ref Range Status   SARS Coronavirus 2 by RT PCR NEGATIVE NEGATIVE Final    Comment: (NOTE) SARS-CoV-2 target nucleic acids are NOT DETECTED.  The SARS-CoV-2 RNA is generally detectable in upper and lower respiratory specimens during the acute phase of infection. The lowest concentration of SARS-CoV-2 viral copies this assay can detect is 250 copies / mL. A negative result does not preclude SARS-CoV-2 infection and should not be used as the sole basis for treatment or other patient management decisions.  A negative result may occur with improper specimen collection / handling, submission of specimen other than nasopharyngeal swab, presence of viral mutation(s) within the areas targeted by this assay, and inadequate number of viral copies (<250 copies / mL). A negative result must be combined with clinical observations, patient history, and epidemiological information.  Fact Sheet for Patients:   https://www.patel.info/  Fact Sheet for Healthcare Providers: https://hall.com/  This test is not yet approved or  cleared by  the [place] FDA and has been authorized for detection and/or diagnosis of SARS-CoV-2 by FDA under an Emergency Use Authorization (EUA).  This EUA will remain in effect (meaning this test can be used) for the duration of the COVID-19 declaration under Section 564(b)(1) of the Act, 21 U.S.C. section 360bbb-3(b)(1), unless the authorization is terminated or revoked sooner.  Performed at [facility], [address], [place], [place] [address]          Radiology Studies: No results found.      Scheduled Meds:  aspirin EC  81 mg Oral Daily   enoxaparin (LOVENOX) injection  40 mg Subcutaneous Q24H   insulin aspart  0-5 Units Subcutaneous QHS   insulin aspart  0-9 Units Subcutaneous TID WC   midodrine  5 mg Oral TID WC   rosuvastatin  10 mg Oral QHS   Continuous Infusions:  cefTRIAXone (ROCEPHIN)  IV      Assessment & Plan:   Principal Problem:   Sinus tachycardia Active Problems:   Syncope  History of insertion of insulin pump   Vomiting and diarrhea   Type 1 diabetes mellitus ([place])   Coronary artery disease involving native coronary artery of native heart   Sinus tachycardia Suspect related to acute condition Free T4 minimally elevated, with normal TSH  Held beta-blockers due to hypotension specially upon standing  7/10 resume IV fluids for continuous hydration    Syncope Suspect vasovagal, and orthostatics from hypovolemia 7/10 still symptomatic, improving with midodrine and ivf Will resume ivf   Vomiting and diarrhea Resolved Continue hydration    Coronary artery disease involving native coronary artery of native heart Continue asa and statin    Type 1 diabetes mellitus (HCC) Pump on hold BG elevated We will add Semglee 10 units daily Add NovoLog 3 units 3 times daily with meals if eats 50% or more  Orthostatics hypotension Continue midodrine Ivf   DVT prophylaxis: Lovenox Code Status: DNR Family Communication: None at  bedside Disposition Plan Home with home health Status is: Inpatient Remains inpatient appropriate because: IV treatment.  Still symptomatic from orthostatic hypotension     LOS: 2 days   Time spent: 35 min    Nolberto Hanlon, MD [facility] Pager [phone]  If 7PM-7AM, please contact night-coverage 10/13/2021, 8:07 AM

## 2021-10-13 NOTE — Inpatient Diabetes Management (Addendum)
Inpatient Diabetes Program Recommendations  AACE/ADA: New Consensus Statement on Inpatient Glycemic Control   Target Ranges:  Prepandial:   less than 140 mg/dL      Peak postprandial:   less than 180 mg/dL (1-2 hours)      Critically ill patients:  140 - 180 mg/dL    Latest Reference Range & Units 10/12/21 07:41 10/12/21 11:47 10/12/21 16:17 10/12/21 20:02 10/13/21 08:15  Glucose-Capillary 70 - 99 mg/dL 358 (H) 193 (H) 89 211 (H) 339 (H)   Review of Glycemic Control  Diabetes history: DM1 (does NOT make any insulin; requires basal, correction, and carbohydrate coverage insulin) Outpatient Diabetes medications: OmniPod Insulin Pump with Humalog along with Dexcom CGM; Lantus 13 units daily if not using insulin pump Current orders for Inpatient glycemic control: Novolog 0-9 units TID with meals, Novolog 0-5 units QHS  Inpatient Diabetes Program Recommendations:    Insulin: Please consider ordering Semglee 13 units daily and  Novolog 3 units TID with meals for meal coverage if patient eats at least 50% of meals.  NOTE: Per chart, reviewed, noted patient sees [facility] and was last seen by DR. Ander Gaster on 08/04/21 and seen Adelina Mings, RD, [facility] on 10/03/21. Insulin pump settings not noted in either note.  Spoke with patient over the phone to inquire about insulin pump. Patient states she removed her insulin pump on the day she came to the hospital. She does not remember if insulin pump was removed at home prior to coming to the hospital or if it was removed in the ED. Patient reports that her daughter has her insulin pump at home. Patient states she has Lantus to use at home when not using her insulin pump but she is not sure how old it is as she has had it a long time. Patient states that her glucose usually runs well and she reports she uses the Auto mode on insulin pump (along with Dexcom). Discussed current insulin orders with just Novolog correction and that a request for basal  insulin would be made today if she is going to remain inpatient. Patient states she has been told that she will not be discharged today. Patient would like to resume her insulin pump at discharge.   Thanks, Diana Alderman, RN, MSN, [place] Diabetes Coordinator Inpatient Diabetes Program [phone] (Team Pager from 8am to [place])

## 2021-10-14 DIAGNOSIS — N390 Urinary tract infection, site not specified: Secondary | ICD-10-CM

## 2021-10-14 DIAGNOSIS — R000 Tachycardia, unspecified: Secondary | ICD-10-CM | POA: Diagnosis not present

## 2021-10-14 LAB — GLUCOSE, CAPILLARY
Glucose-Capillary: 61 mg/dL — ABNORMAL LOW (ref 70–99)
Glucose-Capillary: 169 mg/dL — ABNORMAL HIGH (ref 70–99)
Glucose-Capillary: 209 mg/dL — ABNORMAL HIGH (ref 70–99)
Glucose-Capillary: 125 mg/dL — ABNORMAL HIGH (ref 70–99)
Glucose-Capillary: 85 mg/dL (ref 70–99)

## 2021-10-14 MED ORDER — INSULIN GLARGINE-YFGN 100 UNIT/ML ~~LOC~~ SOLN
5.0000 [IU] | Freq: Every day | SUBCUTANEOUS | Status: DC
  Administered 2021-10-14 – 2021-10-15 (×2): 5 [IU] via SUBCUTANEOUS
  Filled 2021-10-14 (×2): qty 0.05

## 2021-10-14 MED ORDER — INSULIN ASPART 100 UNIT/ML IJ SOLN
2.0000 [IU] | Freq: Three times a day (TID) | INTRAMUSCULAR | Status: DC
  Administered 2021-10-14 – 2021-10-15 (×4): 2 [IU] via SUBCUTANEOUS
  Filled 2021-10-14 (×4): qty 1

## 2021-10-14 NOTE — Inpatient Diabetes Management (Signed)
Inpatient Diabetes Program Recommendations  AACE/ADA: New Consensus Statement on Inpatient Glycemic Control   Target Ranges:  Prepandial:   less than 140 mg/dL      Peak postprandial:   less than 180 mg/dL (1-2 hours)      Critically ill patients:  140 - 180 mg/dL    Latest Reference Range & Units 10/13/21 08:15 10/13/21 12:10 10/13/21 15:21 10/13/21 16:04 10/13/21 18:25 10/13/21 18:46 10/13/21 19:03 10/13/21 19:31 10/13/21 20:03 10/14/21 07:44  Glucose-Capillary 70 - 99 mg/dL 339 (H)  Novolog 7 units 222 (H)  Novolog 3 units     Semglee 10 units 109 (H)  Novolog 3 units @ 17:33 61 (L) 57 (L) 61 (L) 85 116 (H) 169 (H)  Novolog 2 units   Review of Glycemic Control  Diabetes history: DM1 (does NOT make any insulin; requires basal, correction, and carbohydrate coverage insulin) Outpatient Diabetes medications: OmniPod Insulin Pump with Humalog along with Dexcom CGM; Lantus 13 units daily if not using insulin pump Current orders for Inpatient glycemic control: Semglee 5 units daily, Novolog 0-9 units TID with meals, Novolog 0-5 units QHS   Inpatient Diabetes Program Recommendations:     Insulin: Patient received Semglee 10 units on 10/13/21. Noted hypoglycemia yesterday afternoon which was likely due to Novolog 3 units meal coverage. Per chart, patient vomited after eating supper.  Please consider increasing Semglee to 10 units daily.  Once patient is eating well, will need meal coverage insulin as she has Type 1 DM and makes no insulin; therefore, please consider ordering Novolog 2 units TID with meals for meal coverage if patient eats at least 50% of meals.  Thanks, Barnie Alderman, RN, MSN, [place] Diabetes Coordinator Inpatient Diabetes Program [phone] (Team Pager from 8am to [place])

## 2021-10-14 NOTE — Progress Notes (Signed)
Physical Therapy Treatment Patient Details Name: Diana Frost MRN: [id number] DOB: February 06, 1946 Today's Date: 10/14/2021   History of Present Illness 76 y/o F admitted on 10/10/21 after being brought in with c/o 2 syncopal episodes at home. Pt is being treated for sinus tachycardia. PMH: DM2 on insulin pump, CAD, MI    PT Comments    Pt did well with PT session.  She did have mild BP drops with orthostatic progression but no dizziness/lightheadedness t/o the session.  She was able to circumambulate the nurses' station with consistent and confident cadence, using walker (per baseline.)  She did not have a lot of fatigue, though HR remained elevated though consistent in the 100-115 range most of the time.  Pt pleased to have been able to compete a longer walk and done well.  Recommendations for follow up therapy are one component of a multi-disciplinary discharge planning process, led by the attending physician.  Recommendations may be updated based on patient status, additional functional criteria and insurance authorization.  Follow Up Recommendations  Home health PT     Assistance Recommended at Discharge Frequent or constant Supervision/Assistance  Patient can return home with the following Assistance with cooking/housework;Help with stairs or ramp for entrance;Assist for transportation   Equipment Recommendations  None recommended by PT    Recommendations for Other Services       Precautions / Restrictions Precautions Precautions: Fall Restrictions Weight Bearing Restrictions: No     Mobility  Bed Mobility Overal bed mobility: Modified Independent Bed Mobility: Supine to Sit     Supine to sit: Supervision     General bed mobility comments: slow but indepedent w/o rails    Transfers Overall transfer level: Needs assistance Equipment used: Rolling walker (2 wheels) Transfers: Sit to/from Stand Sit to Stand: Supervision           General transfer comment: able  to rise w/o hesitation or assist    Ambulation/Gait Ambulation/Gait assistance: Supervision Gait Distance (Feet): 200 Feet Assistive device: Rolling walker (2 wheels)         General Gait Details: Pt with consistent cadence and minimal UE reliance.  She reports feeling somewhat slower than her baseline but overall tolerated first "long" walk in days well.  HR was generally elevated t/o the effort (100-115 bpm most of the time)  but did not grossly increase with prolonged ambulation   Stairs             Wheelchair Mobility    Modified Rankin (Stroke Patients Only)       Balance Overall balance assessment: Needs assistance, History of Falls Sitting-balance support: Feet supported Sitting balance-Leahy Scale: Good     Standing balance support: Bilateral upper extremity supported Standing balance-Leahy Scale: Good Standing balance comment: good balance with walker use                            Cognition Arousal/Alertness: Awake/alert Behavior During Therapy: WFL for tasks assessed/performed Overall Cognitive Status: Within Functional Limits for tasks assessed                                          Exercises      General Comments General comments (skin integrity, edema, etc.): orthostatics supine 135/93, sitting 122/73, standing 93/58 with no S/S of lightheadeness, etc      Pertinent Vitals/Pain Pain  Assessment Pain Assessment: No/denies pain    Home Living                          Prior Function            PT Goals (current goals can now be found in the care plan section) Progress towards PT goals: Progressing toward goals    Frequency    Min 2X/week      PT Plan Current plan remains appropriate    Co-evaluation              AM-PAC PT "6 Clicks" Mobility   Outcome Measure  Help needed turning from your back to your side while in a flat bed without using bedrails?: None Help needed moving  from lying on your back to sitting on the side of a flat bed without using bedrails?: None Help needed moving to and from a bed to a chair (including a wheelchair)?: None Help needed standing up from a chair using your arms (e.g., wheelchair or bedside chair)?: None Help needed to walk in hospital room?: None Help needed climbing 3-5 steps with a railing? : A Little 6 Click Score: 23    End of Session Equipment Utilized During Treatment: Gait belt Activity Tolerance: Patient tolerated treatment well Patient left: with chair alarm set;with call bell/phone within reach Nurse Communication: Mobility status (vitals with movement`) PT Visit Diagnosis: Unsteadiness on feet (R26.81);History of falling (Z91.81);Muscle weakness (generalized) (M62.81)     Time: 0762-2633 PT Time Calculation (min) (ACUTE ONLY): 18 min  Charges:  $Gait Training: 23-37 mins                     Kreg Shropshire, DPT 10/14/2021, 4:32 PM

## 2021-10-14 NOTE — Care Management Important Message (Signed)
Important Message  Patient Details  Name: Diana Frost MRN: [id number] Date of Birth: [birth year]   Medicare Important Message Given:  Yes     [name] A Ashia Dehner 10/14/2021, 11:02 AM

## 2021-10-14 NOTE — Plan of Care (Signed)

## 2021-10-14 NOTE — Progress Notes (Addendum)
PROGRESS NOTE    Diana Frost  YPP:[id number] DOB: February 28, 1946 DOA: 10/10/2021 PCP: Latanya Maudlin, NP    Brief Narrative:  Diana Frost is a 76 y.o. female with medical history significant for Type 1 diabetes on an insulin pump, hypertension, CAD who was brought to the ED after 2 syncopal episodes at home.  Patient has a history of intermittent vomiting but over the past 2 weeks she has had an increased frequency of vomiting.  They have been nonbloody and nonbilious.  Additionally she has had several episodes of nonbloody and nonmelanotic diarrhea..  O2 sat initially 96% on room air but downtrending to 86% by the time of admission D-dimer 2.38 and troponin to  COVID-negative.  TSH normal at 0.777 but free T4 slightly elevated at 1.15.  Lactic acid normal. Trauma imaging to include CT head, C-spine and x-rays of thoracic spine and chest x-ray mostly nonacute though chest x-ray does show streaky atelectasis versus minimal pneumonia at left base. CT angio chest negative for PE or aortic dissection and shows the following: IMPRESSION: 1. Negative for acute pulmonary embolus or aortic dissection. 2. Trace pleural effusions. Mild diffuse bilateral septal thickening and minimal hazy/ground-glass densities suggestive of mild edema.   Patient was found with profound orthostatics. Also found with UTI. Started on IVF and iv abx. Ucx pending. Vomited last night because ate too much. Bg down was given OJ    Consultants:    Procedures:   Antimicrobials:   Rocephin   Subjective: No n/v/d, sob  Objective: Vitals:   10/14/21 0936 10/14/21 0938 10/14/21 1037 10/14/21 1147  BP: 107/62 (!) 90/54  (!) 141/78  Pulse: (!) 109 (!) 112 85 90  Resp:    18  Temp:    99.1 F (37.3 C)  TempSrc:      SpO2: 100% 98%  100%  Weight:      Height:        Intake/Output Summary (Last 24 hours) at 10/14/2021 1437 Last data filed at 10/14/2021 0900 Gross per 24 hour  Intake 520.83 ml  Output  1350 ml  Net -829.17 ml   Filed Weights   10/10/21 1451  Weight: 59 kg    Examination: Calm, NAD Cta no w/r Reg s1/s2 no gallop Soft benign +bs No edema Aaoxox3  Mood and affect appropriate in current setting    Data Reviewed: I have personally reviewed following labs and imaging studies  CBC: Recent Labs  Lab 10/10/21 1456 10/11/21 0737 10/12/21 0856  WBC 4.4 10.6* 7.4  HGB 12.4 11.7* 11.4*  HCT 38.5 36.7 35.7*  MCV 97.0 97.1 98.3  PLT 188 179 458   Basic Metabolic Panel: Recent Labs  Lab 10/10/21 1456 10/11/21 0737  NA 138  --   K 3.9 4.5  CL 103  --   CO2 27  --   GLUCOSE 149*  --   BUN 14  --   CREATININE 0.98 0.77  CALCIUM 8.7*  --    GFR: Estimated Creatinine Clearance: 47.3 mL/min (by C-G formula based on SCr of 0.77 mg/dL). Liver Function Tests: No results for input(s): "AST", "ALT", "ALKPHOS", "BILITOT", "PROT", "ALBUMIN" in the last 168 hours. No results for input(s): "LIPASE", "AMYLASE" in the last 168 hours. No results for input(s): "AMMONIA" in the last 168 hours. Coagulation Profile: No results for input(s): "INR", "PROTIME" in the last 168 hours. Cardiac Enzymes: No results for input(s): "CKTOTAL", "CKMB", "CKMBINDEX", "TROPONINI" in the last 168 hours. BNP (last 3 results) No  results for input(s): "PROBNP" in the last 8760 hours. HbA1C: No results for input(s): "HGBA1C" in the last 72 hours.  CBG: Recent Labs  Lab 10/13/21 1903 10/13/21 1931 10/13/21 2003 10/14/21 0744 10/14/21 1148  GLUCAP 61* 85 116* 169* 209*   Lipid Profile: No results for input(s): "CHOL", "HDL", "LDLCALC", "TRIG", "CHOLHDL", "LDLDIRECT" in the last 72 hours. Thyroid Function Tests: No results for input(s): "TSH", "T4TOTAL", "FREET4", "T3FREE", "THYROIDAB" in the last 72 hours.  Anemia Panel: No results for input(s): "VITAMINB12", "FOLATE", "FERRITIN", "TIBC", "IRON", "RETICCTPCT" in the last 72 hours. Sepsis Labs: Recent Labs  Lab 10/10/21 2336  10/11/21 0141  LATICACIDVEN 2.3* 1.1    Recent Results (from the past 240 hour(s))  SARS Coronavirus 2 by RT PCR (hospital order, performed in [facility] hospital lab) *cepheid single result test* Anterior Nasal Swab     Status: None   Collection Time: 10/10/21 11:26 PM   Specimen: Anterior Nasal Swab  Result Value Ref Range Status   SARS Coronavirus 2 by RT PCR NEGATIVE NEGATIVE Final    Comment: (NOTE) SARS-CoV-2 target nucleic acids are NOT DETECTED.  The SARS-CoV-2 RNA is generally detectable in upper and lower respiratory specimens during the acute phase of infection. The lowest concentration of SARS-CoV-2 viral copies this assay can detect is 250 copies / mL. A negative result does not preclude SARS-CoV-2 infection and should not be used as the sole basis for treatment or other patient management decisions.  A negative result may occur with improper specimen collection / handling, submission of specimen other than nasopharyngeal swab, presence of viral mutation(s) within the areas targeted by this assay, and inadequate number of viral copies (<250 copies / mL). A negative result must be combined with clinical observations, patient history, and epidemiological information.  Fact Sheet for Patients:   https://www.patel.info/  Fact Sheet for Healthcare Providers: https://hall.com/  This test is not yet approved or  cleared by the [place] FDA and has been authorized for detection and/or diagnosis of SARS-CoV-2 by FDA under an Emergency Use Authorization (EUA).  This EUA will remain in effect (meaning this test can be used) for the duration of the COVID-19 declaration under Section 564(b)(1) of the Act, 21 U.S.C. section 360bbb-3(b)(1), unless the authorization is terminated or revoked sooner.  Performed at [facility], [address], [place], [place] [address]   Urine Culture     Status: Abnormal (Preliminary  result)   Collection Time: 10/12/21  8:25 AM   Specimen: Urine, Catheterized  Result Value Ref Range Status   Specimen Description   Final    URINE, CATHETERIZED Performed at [facility], [address], [place], [place] [address]    Special Requests   Final    NONE Performed at [facility], [address] [place], [place] [address]    Culture (A)  Final    >=100,000 COLONIES/mL ESCHERICHIA COLI SUSCEPTIBILITIES TO FOLLOW Performed at [facility], [place] [address], [place], [place] [address]    Report Status PENDING  Incomplete         Radiology Studies: No results found.      Scheduled Meds:  aspirin EC  81 mg Oral Daily   enoxaparin (LOVENOX) injection  40 mg Subcutaneous Q24H   insulin aspart  0-5 Units Subcutaneous QHS   insulin aspart  0-9 Units Subcutaneous TID WC   insulin glargine-yfgn  5 Units Subcutaneous Daily   midodrine  5 mg Oral TID WC   rosuvastatin  10 mg Oral QHS   Continuous Infusions:  cefTRIAXone (ROCEPHIN)  IV 1 g (10/14/21 5102)    Assessment & Plan:   Principal Problem:   Sinus tachycardia Active Problems:   Syncope   History of insertion of insulin pump   Vomiting and diarrhea   Type 1 diabetes mellitus (HCC)   Coronary artery disease involving native coronary artery of native heart   UTI (urinary tract infection)   Sinus tachycardia Suspect related to acute condition Free T4 minimally elevated, with normal TSH  Held beta-blockers due to hypotension especially upon standing  7/11 improving. Once bp more stable can resume beta blk.    Syncope Suspect vasovagal, and orthostatics from hypovolemia 7/11 was symptomatic .  Orthostatics improving with ivf Encouraged po hydration Continue midodrine.   UTI Started on rocephin Ucx >100,000 GN rod>>e.coli Sensitivities pending, will f/u  Vomiting and diarrhea Diarrhea resolved Had vomiting last night after eating large meal. Reports this happens  at home Encouraged small frequent meals.    Coronary artery disease involving native coronary artery of native heart Continue asa and statin    Type 1 diabetes mellitus (HCC) Pump on hold Bg low due to vomiting. Decrease semglee to 5units for now, increase while eating more Add novolog 2units with meals tid  Orthostatics hypotension Continue midodrine Improving with ivf. Dc ivf , encouraged po hydration  DVT prophylaxis: Lovenox Code Status: DNR Family Communication: None at bedside Disposition Plan Home with home health Status is: Inpatient Remains inpatient appropriate because: IV treatment.     LOS: 3 days   Time spent: 35 min    Nolberto Hanlon, MD [facility] Pager [phone]  If 7PM-7AM, please contact night-coverage 10/14/2021, 2:37 PM

## 2021-10-15 DIAGNOSIS — R000 Tachycardia, unspecified: Secondary | ICD-10-CM | POA: Diagnosis not present

## 2021-10-15 LAB — BASIC METABOLIC PANEL
Anion gap: 10 (ref 5–15)
BUN: 8 mg/dL (ref 8–23)
CO2: 29 mmol/L (ref 22–32)
Calcium: 8.2 mg/dL — ABNORMAL LOW (ref 8.9–10.3)
Chloride: 101 mmol/L (ref 98–111)
Creatinine, Ser: 0.92 mg/dL (ref 0.44–1.00)
GFR, Estimated: >60 mL/min (ref >60)
Glucose, Bld: 273 mg/dL — ABNORMAL HIGH (ref 70–99)
Potassium: 2.9 mmol/L — ABNORMAL LOW (ref 3.5–5.1)
Sodium: 140 mmol/L (ref 135–145)

## 2021-10-15 LAB — CBC WITH DIFFERENTIAL/PLATELET
Abs Immature Granulocytes: 0.01 10*3/uL (ref 0.00–0.07)
Basophils Absolute: 0 10*3/uL (ref 0.0–0.1)
Basophils Relative: 1 %
Eosinophils Absolute: 0.1 10*3/uL (ref 0.0–0.5)
Eosinophils Relative: 4 %
HCT: 36.4 % (ref 36.0–46.0)
Hemoglobin: 12 g/dL (ref 12.0–15.0)
Immature Granulocytes: 0 %
Lymphocytes Relative: 24 %
Lymphs Abs: 0.9 10*3/uL (ref 0.7–4.0)
MCH: 30.5 pg (ref 26.0–34.0)
MCHC: 33 g/dL (ref 30.0–36.0)
MCV: 92.6 fL (ref 80.0–100.0)
Monocytes Absolute: 0.4 10*3/uL (ref 0.1–1.0)
Monocytes Relative: 11 %
Neutro Abs: 2.4 10*3/uL (ref 1.7–7.7)
Neutrophils Relative %: 60 %
Platelets: 195 10*3/uL (ref 150–400)
RBC: 3.93 MIL/uL (ref 3.87–5.11)
RDW: 12.7 % (ref 11.5–15.5)
WBC: 3.9 10*3/uL — ABNORMAL LOW (ref 4.0–10.5)
nRBC: 0 % (ref 0.0–0.2)

## 2021-10-15 LAB — GLUCOSE, CAPILLARY
Glucose-Capillary: 252 mg/dL — ABNORMAL HIGH (ref 70–99)
Glucose-Capillary: 210 mg/dL — ABNORMAL HIGH (ref 70–99)
Glucose-Capillary: 137 mg/dL — ABNORMAL HIGH (ref 70–99)

## 2021-10-15 LAB — URINE CULTURE: Culture: >=100000 — AB

## 2021-10-15 MED ORDER — INSULIN GLARGINE-YFGN 100 UNIT/ML ~~LOC~~ SOLN
8.0000 [IU] | Freq: Every day | SUBCUTANEOUS | Status: DC
Start: 2021-10-16 — End: 2021-10-16

## 2021-10-15 MED ORDER — POTASSIUM CHLORIDE CRYS ER 20 MEQ PO TBCR
60.0000 meq | EXTENDED_RELEASE_TABLET | ORAL | Status: AC
  Administered 2021-10-15 (×2): 60 meq via ORAL
  Filled 2021-10-15 (×2): qty 3

## 2021-10-15 MED ORDER — CIPROFLOXACIN HCL 500 MG PO TABS
250.0000 mg | ORAL_TABLET | Freq: Two times a day (BID) | ORAL | Status: AC
  Administered 2021-10-15 (×2): 250 mg via ORAL
  Filled 2021-10-15 (×2): qty 1

## 2021-10-15 MED ORDER — CIPROFLOXACIN HCL 500 MG PO TABS: 250.0000 mg | ORAL_TABLET | Freq: Two times a day (BID) | ORAL | Status: DC

## 2021-10-15 MED ORDER — CIPROFLOXACIN HCL 500 MG PO TABS
250.0000 mg | ORAL_TABLET | Freq: Two times a day (BID) | ORAL | Status: DC
Start: 2021-10-15 — End: 2021-10-15

## 2021-10-15 NOTE — Inpatient Diabetes Management (Signed)
Inpatient Diabetes Program Recommendations  AACE/ADA: New Consensus Statement on Inpatient Glycemic Control   Target Ranges:  Prepandial:   less than 140 mg/dL      Peak postprandial:   less than 180 mg/dL (1-2 hours)      Critically ill patients:  140 - 180 mg/dL    Latest Reference Range & Units 10/14/21 07:44 10/14/21 11:48 10/14/21 16:39 10/14/21 19:31 10/15/21 08:17  Glucose-Capillary 70 - 99 mg/dL 169 (H) 209 (H) 125 (H) 85 252 (H)   Review of Glycemic Control  Diabetes history: DM1 (does NOT make any insulin; requires basal, correction, and carbohydrate coverage insulin) Outpatient Diabetes medications: OmniPod Insulin Pump with Humalog along with Dexcom CGM; Lantus 13 units daily if not using insulin pump Current orders for Inpatient glycemic control: Semglee 5 units daily, Novolog 0-9 units TID with meals, Novolog 0-5 units QHS, Novolog 2 units TID with meals  Inpatient Diabetes Program Recommendations:    Insulin: Please consider increasing Semglee to 8 units daily.  Thanks, Barnie Alderman, RN, MSN, [place] Diabetes Coordinator Inpatient Diabetes Program [phone] (Team Pager from 8am to [place])

## 2021-10-15 NOTE — TOC Progression Note (Addendum)
Transition of Care [facility]) - Progression Note    Patient Details  Name: Diana Frost MRN: [id number] Date of Birth: [birth year]  [facility] CM/SW Contact  Eileen Stanford, LCSW Phone Number: 10/15/2021, 9:54 AM  Clinical Narrative:   Pt is active with both [facility] and [facility]     Expected Discharge Plan: [place] Barriers to Discharge: Continued Medical Work up  Expected Discharge Plan and Services Expected Discharge Plan: [place]   Discharge Planning Services: CM Consult Post Acute Care Choice: [place] arrangements for the past 2 months: Single Family Home                                       Social Determinants of Health (SDOH) Interventions    Readmission Risk Interventions     No data to display

## 2021-10-15 NOTE — Discharge Summary (Signed)
Physician Discharge Summary  Diana Frost ION:[id number] DOB: 1945-10-22 DOA: 10/10/2021  PCP: Latanya Maudlin, NP  Admit date: 10/10/2021 Discharge date: 10/15/2021  Admitted From: Home Disposition: Home with home health  Recommendations for Outpatient Follow-up:  Follow up with PCP in 1-2 weeks   Home Health: Yes PT RN Equipment/Devices: None  Discharge Condition: Stable CODE STATUS: Full Diet recommendation: Heart healthy  Brief/Interim Summary:. 76 y.o. female with medical history significant for Type 1 diabetes on an insulin pump, hypertension, CAD who was brought to the ED after 2 syncopal episodes at home.  Patient has a history of intermittent vomiting but over the past 2 weeks she has had an increased frequency of vomiting.  They have been nonbloody and nonbilious.  Additionally she has had several episodes of nonbloody and nonmelanotic diarrhea..  O2 sat initially 96% on room air but downtrending to 86% by the time of admission D-dimer 2.38 and troponin to  COVID-negative.  TSH normal at 0.777 but free T4 slightly elevated at 1.15.  Lactic acid normal. Trauma imaging to include CT head, C-spine and x-rays of thoracic spine and chest x-ray mostly nonacute though chest x-ray does show streaky atelectasis versus minimal pneumonia at left base.  Patient was found to be orthostatic.  Also found to have UTI.  Treated for UTI and orthostasis improved.  Ambulating around nursing unit without symptomatic orthostasis.  Stable for discharge.  Home health ordered.  Completed course of antibiotics in house.  No antibiotics indicated on discharge.    Discharge Diagnoses:  Principal Problem:   Sinus tachycardia Active Problems:   Syncope   History of insertion of insulin pump   Vomiting and diarrhea   Type 1 diabetes mellitus ([place])   Coronary artery disease involving native coronary artery of native heart   UTI (urinary tract infection)  Sinus tachycardia Resolved.  Likely  related to acute condition.  Insensible losses in setting of UTI.  Do not suspect thyroid related.  Can resume home beta-blocker at time of discharge.   Syncope Suspect vasovagal, and orthostatics from hypovolemia 7/11 was symptomatic .  Orthostatics improving with ivf Encouraged po hydration Continue midodrine.   UTI Completed course of abx   Vomiting and diarrhea Diarrhea resolved Had vomiting last night after eating large meal. Reports this happens at home Encouraged small frequent meals.     Coronary artery disease involving native coronary artery of native heart Continue asa and statin     Type 1 diabetes mellitus ([place]) Resume home pump   Orthostatics hypotension Improving with IVF Home PT  Discharge Instructions  Discharge Instructions     Diet - low sodium heart healthy   Complete by: As directed    Increase activity slowly   Complete by: As directed       Allergies as of 10/15/2021       Reactions   Bupropion    Other reaction(s): Other (See Comments) Constipation    Percocet [oxycodone-acetaminophen] Nausea And Vomiting   Statins    Joint pain   Sulfa Antibiotics Nausea And Vomiting        Medication List     STOP taking these medications    cyanocobalamin 1000 MCG/ML injection Commonly known as: (VITAMIN B-12)   escitalopram 10 MG tablet Commonly known as: LEXAPRO   FLUoxetine 40 MG capsule Commonly known as: PROZAC   insulin glargine 100 UNIT/ML injection Commonly known as: LANTUS       TAKE these medications    acetaminophen 325  MG tablet Commonly known as: TYLENOL Take 2 tablets (650 mg total) by mouth every 6 (six) hours as needed for mild pain, fever or headache (pain score 1-3 or temp > 100.5).   alendronate 70 MG tablet Commonly known as: FOSAMAX Take 70 mg by mouth once a week.   aspirin 81 MG tablet Take 81 mg by mouth daily.   docusate sodium 100 MG capsule Commonly known as: COLACE Take 1 capsule (100 mg  total) by mouth 2 (two) times daily.   DULoxetine 20 MG capsule Commonly known as: CYMBALTA Take 40 mg by mouth as directed. Take 1 capsule (20 mg total) by mouth once daily for 14 days, THEN 2 capsules (40 mg total) once daily for 14 days.1-2   DULoxetine 60 MG capsule Commonly known as: CYMBALTA Take 60 mg by mouth daily.   GLUCAGON EMERGENCY IJ Inject as directed as needed (for low blood sugar).   insulin lispro 100 UNIT/ML injection Commonly known as: HUMALOG Inject 0-23 Units into the skin daily. VIA PUMP   rosuvastatin 10 MG tablet Commonly known as: CRESTOR Take 10 mg by mouth at bedtime.        Allergies  Allergen Reactions   Bupropion     Other reaction(s): Other (See Comments) Constipation    Percocet [Oxycodone-Acetaminophen] Nausea And Vomiting   Statins     Joint pain   Sulfa Antibiotics Nausea And Vomiting    Consultations: None   Procedures/Studies: CT Angio Chest PE W and/or Wo Contrast  Result Date: 10/11/2021 CLINICAL DATA:  Multiple syncopal episodes positive D-dimer EXAM: CT ANGIOGRAPHY CHEST WITH CONTRAST TECHNIQUE: Multidetector CT imaging of the chest was performed using the standard protocol during bolus administration of intravenous contrast. Multiplanar CT image reconstructions and MIPs were obtained to evaluate the vascular anatomy. RADIATION DOSE REDUCTION: This exam was performed according to the departmental dose-optimization program which includes automated exposure control, adjustment of the mA and/or kV according to patient size and/or use of iterative reconstruction technique. CONTRAST:  6m OMNIPAQUE IOHEXOL 350 MG/ML SOLN COMPARISON:  Chest x-ray 10/10/2021 FINDINGS: Cardiovascular: Satisfactory opacification of the pulmonary arteries to the segmental level. No evidence of pulmonary embolism. Nonaneurysmal aorta. Mild atherosclerosis. No dissection is seen. Coronary vascular calcification. Normal cardiac size. No pericardial effusion.  Mediastinum/Nodes: Midline trachea. No thyroid mass. No suspicious lymph nodes. Esophagus within normal limits. Lungs/Pleura: Trace pleural effusions. Dependent atelectasis at the bases. Mild diffuse bilateral septal thickening and minimal ground-glass densities. Upper Abdomen: No acute abnormality. Musculoskeletal: No chest wall abnormality. No acute or significant osseous findings. Review of the MIP images confirms the above findings. IMPRESSION: 1. Negative for acute pulmonary embolus or aortic dissection. 2. Trace pleural effusions. Mild diffuse bilateral septal thickening and minimal hazy/ground-glass densities suggestive of mild edema. Aortic Atherosclerosis (ICD10-I70.0). Electronically Signed   By: KDonavan FoilM.D.   On: 10/11/2021 01:01   DG Chest Portable 1 View  Result Date: 10/10/2021 CLINICAL DATA:  Shortness of breath EXAM: PORTABLE CHEST 1 VIEW COMPARISON:  11/07/2015 FINDINGS: Streaky atelectasis versus minimal pneumonia at the left base. No pleural effusion. Normal cardiac size. No pneumothorax. There are bronchitic changes IMPRESSION: Streaky atelectasis versus mild pneumonia at the left base. Electronically Signed   By: KDonavan FoilM.D.   On: 10/10/2021 23:15   DG Thoracic Spine 2 View  Result Date: 10/10/2021 CLINICAL DATA:  Fall with upper back pain EXAM: THORACIC SPINE 2 VIEWS COMPARISON:  None Available. FINDINGS: Mild dextrocurvature. Sagittal alignment within normal limits. Vertebral  body heights are maintained. There are mild degenerative osteophytes. IMPRESSION: Mild dextrocurvature and degenerative change. No acute osseous abnormality. Electronically Signed   By: Donavan Foil M.D.   On: 10/10/2021 19:12   CT CERVICAL SPINE WO CONTRAST  Result Date: 10/10/2021 CLINICAL DATA:  Status post fall with head trauma. Syncopal episodes. EXAM: CT CERVICAL SPINE WITHOUT CONTRAST TECHNIQUE: Multidetector CT imaging of the cervical spine was performed without intravenous contrast.  Multiplanar CT image reconstructions were also generated. RADIATION DOSE REDUCTION: This exam was performed according to the departmental dose-optimization program which includes automated exposure control, adjustment of the mA and/or kV according to patient size and/or use of iterative reconstruction technique. COMPARISON:  CT cervical spine 04/04/2019. FINDINGS: Alignment: Normal. Skull base and vertebrae: No evidence of acute cervical spine fracture or traumatic subluxation. Soft tissues and spinal canal: No prevertebral fluid or swelling. No visible canal hematoma. Disc levels: Stable multilevel spondylosis with disc space narrowing, uncinate spurring and mild facet hypertrophy. No large disc herniation or high-grade spinal stenosis. Upper chest: Clear lung apices. Other: Bilateral carotid atherosclerosis. Stable chronic left mastoid effusion. IMPRESSION: 1. No evidence of acute cervical spine fracture, traumatic subluxation or static signs of instability. 2. Stable spondylosis. Electronically Signed   By: Richardean Sale M.D.   On: 10/10/2021 15:31   CT HEAD WO CONTRAST  Result Date: 10/10/2021 CLINICAL DATA:  Head trauma. Multiple syncopal episodes with dizziness. Head injury this morning. EXAM: CT HEAD WITHOUT CONTRAST TECHNIQUE: Contiguous axial images were obtained from the base of the skull through the vertex without intravenous contrast. RADIATION DOSE REDUCTION: This exam was performed according to the departmental dose-optimization program which includes automated exposure control, adjustment of the mA and/or kV according to patient size and/or use of iterative reconstruction technique. COMPARISON:  CT head 04/04/2019. FINDINGS: Brain: There is no evidence of acute intracranial hemorrhage, mass lesion, brain edema or extra-axial fluid collection. The ventricles and subarachnoid spaces are appropriately sized for age. There is no CT evidence of acute cortical infarction. Prominent low-density in the  periventricular and subcortical white matter is similar to the previous study and most consistent with chronic small vessel ischemic changes. Vascular: Prominent intracranial vascular calcifications. No hyperdense vessel identified. Skull: Negative for fracture or focal lesion. Sinuses/Orbits: Stable left mastoid effusion without evidence of underlying skull base fracture. Fluid or cerumen in the left external auditory canal. The middle ears, right mastoid air cells and paranasal sinuses are clear without air-fluid levels. Other: No significant recurrent soft tissue injury identified in the scalp. IMPRESSION: 1. No acute intracranial findings. 2. Stable chronic small vessel ischemic changes in the periventricular and subcortical white matter. 3. Chronic left mastoid effusion. Electronically Signed   By: Richardean Sale M.D.   On: 10/10/2021 15:27      Subjective: Seen and examined on the day of discharge.  Stable no distress.  A bit fatigued otherwise back to baseline.  Discharge Exam: Vitals:   10/15/21 0817 10/15/21 1209  BP: 130/82 107/73  Pulse: 96 94  Resp:    Temp: 98.6 F (37 C) 99.1 F (37.3 C)  SpO2: 97% 99%   Vitals:   10/15/21 0011 10/15/21 0435 10/15/21 0817 10/15/21 1209  BP: 133/81 140/85 130/82 107/73  Pulse: 87 87 96 94  Resp: 16 18    Temp: 98.3 F (36.8 C) 98.2 F (36.8 C) 98.6 F (37 C) 99.1 F (37.3 C)  TempSrc:      SpO2: 98% 97% 97% 99%  Weight:  Height:        General: Pt is alert, awake, not in acute distress Cardiovascular: RRR, S1/S2 +, no rubs, no gallops Respiratory: CTA bilaterally, no wheezing, no rhonchi Abdominal: Soft, NT, ND, bowel sounds + Extremities: no edema, no cyanosis    The results of significant diagnostics from this hospitalization (including imaging, microbiology, ancillary and laboratory) are listed below for reference.     Microbiology: Recent Results (from the past 240 hour(s))  SARS Coronavirus 2 by RT PCR (hospital  order, performed in [facility] hospital lab) *cepheid single result test* Anterior Nasal Swab     Status: None   Collection Time: 10/10/21 11:26 PM   Specimen: Anterior Nasal Swab  Result Value Ref Range Status   SARS Coronavirus 2 by RT PCR NEGATIVE NEGATIVE Final    Comment: (NOTE) SARS-CoV-2 target nucleic acids are NOT DETECTED.  The SARS-CoV-2 RNA is generally detectable in upper and lower respiratory specimens during the acute phase of infection. The lowest concentration of SARS-CoV-2 viral copies this assay can detect is 250 copies / mL. A negative result does not preclude SARS-CoV-2 infection and should not be used as the sole basis for treatment or other patient management decisions.  A negative result may occur with improper specimen collection / handling, submission of specimen other than nasopharyngeal swab, presence of viral mutation(s) within the areas targeted by this assay, and inadequate number of viral copies (<250 copies / mL). A negative result must be combined with clinical observations, patient history, and epidemiological information.  Fact Sheet for Patients:   https://www.patel.info/  Fact Sheet for Healthcare Providers: https://hall.com/  This test is not yet approved or  cleared by the [place] FDA and has been authorized for detection and/or diagnosis of SARS-CoV-2 by FDA under an Emergency Use Authorization (EUA).  This EUA will remain in effect (meaning this test can be used) for the duration of the COVID-19 declaration under Section 564(b)(1) of the Act, 21 U.S.C. section 360bbb-3(b)(1), unless the authorization is terminated or revoked sooner.  Performed at [facility], [address], [place], [place] [address]   Urine Culture     Status: Abnormal   Collection Time: 10/12/21  8:25 AM   Specimen: Urine, Catheterized  Result Value Ref Range Status   Specimen Description   Final     URINE, CATHETERIZED Performed at [facility], [address] [place], [place] [address]    Special Requests   Final    NONE Performed at [facility], [place], [place] [address]    Culture >=100,000 COLONIES/mL ESCHERICHIA COLI (A)  Final   Report Status 10/15/2021 FINAL  Final   Organism ID, Bacteria ESCHERICHIA COLI (A)  Final      Susceptibility   Escherichia coli - MIC*    AMPICILLIN >=32 RESISTANT Resistant     CEFAZOLIN 8 SENSITIVE Sensitive     CEFEPIME <=0.12 SENSITIVE Sensitive     CEFTRIAXONE <=0.25 SENSITIVE Sensitive     CIPROFLOXACIN <=0.25 SENSITIVE Sensitive     GENTAMICIN >=16 RESISTANT Resistant     IMIPENEM <=0.25 SENSITIVE Sensitive     NITROFURANTOIN <=16 SENSITIVE Sensitive     TRIMETH/SULFA >=320 RESISTANT Resistant     AMPICILLIN/SULBACTAM >=32 RESISTANT Resistant     PIP/TAZO 64 INTERMEDIATE Intermediate     * >=100,000 COLONIES/mL ESCHERICHIA COLI     Labs: BNP (last 3 results) No results for input(s): "BNP" in the last 8760 hours. Basic Metabolic Panel: Recent Labs  Lab 10/10/21 1456 10/11/21 0737 10/15/21 1014  NA 138  --  140  K 3.9 4.5 2.9*  CL 103  --  101  CO2 27  --  29  GLUCOSE 149*  --  273*  BUN 14  --  8  CREATININE 0.98 0.77 0.92  CALCIUM 8.7*  --  8.2*   Liver Function Tests: No results for input(s): "AST", "ALT", "ALKPHOS", "BILITOT", "PROT", "ALBUMIN" in the last 168 hours. No results for input(s): "LIPASE", "AMYLASE" in the last 168 hours. No results for input(s): "AMMONIA" in the last 168 hours. CBC: Recent Labs  Lab 10/10/21 1456 10/11/21 0737 10/12/21 0856 10/15/21 1014  WBC 4.4 10.6* 7.4 3.9*  NEUTROABS  --   --   --  2.4  HGB 12.4 11.7* 11.4* 12.0  HCT 38.5 36.7 35.7* 36.4  MCV 97.0 97.1 98.3 92.6  PLT 188 179 176 195   Cardiac Enzymes: No results for input(s): "CKTOTAL", "CKMB", "CKMBINDEX", "TROPONINI" in the last 168 hours. BNP: Invalid input(s):  "POCBNP" CBG: Recent Labs  Lab 10/14/21 1148 10/14/21 1639 10/14/21 1931 10/15/21 0817 10/15/21 1211  GLUCAP 209* 125* 85 252* 210*   D-Dimer No results for input(s): "DDIMER" in the last 72 hours. Hgb A1c No results for input(s): "HGBA1C" in the last 72 hours. Lipid Profile No results for input(s): "CHOL", "HDL", "LDLCALC", "TRIG", "CHOLHDL", "LDLDIRECT" in the last 72 hours. Thyroid function studies No results for input(s): "TSH", "T4TOTAL", "T3FREE", "THYROIDAB" in the last 72 hours.  Invalid input(s): "FREET3" Anemia work up No results for input(s): "VITAMINB12", "FOLATE", "FERRITIN", "TIBC", "IRON", "RETICCTPCT" in the last 72 hours. Urinalysis    Component Value Date/Time   COLORURINE AMBER (A) 10/12/2021 0825   APPEARANCEUR CLOUDY (A) 10/12/2021 0825   LABSPEC 1.016 10/12/2021 0825   PHURINE 5.0 10/12/2021 0825   GLUCOSEU >=500 (A) 10/12/2021 0825   HGBUR NEGATIVE 10/12/2021 0825   BILIRUBINUR NEGATIVE 10/12/2021 0825   KETONESUR 80 (A) 10/12/2021 0825   PROTEINUR NEGATIVE 10/12/2021 0825   NITRITE NEGATIVE 10/12/2021 0825   LEUKOCYTESUR LARGE (A) 10/12/2021 0825   Sepsis Labs Recent Labs  Lab 10/10/21 1456 10/11/21 0737 10/12/21 0856 10/15/21 1014  WBC 4.4 10.6* 7.4 3.9*   Microbiology Recent Results (from the past 240 hour(s))  SARS Coronavirus 2 by RT PCR (hospital order, performed in [place] hospital lab) *cepheid single result test* Anterior Nasal Swab     Status: None   Collection Time: 10/10/21 11:26 PM   Specimen: Anterior Nasal Swab  Result Value Ref Range Status   SARS Coronavirus 2 by RT PCR NEGATIVE NEGATIVE Final    Comment: (NOTE) SARS-CoV-2 target nucleic acids are NOT DETECTED.  The SARS-CoV-2 RNA is generally detectable in upper and lower respiratory specimens during the acute phase of infection. The lowest concentration of SARS-CoV-2 viral copies this assay can detect is 250 copies / mL. A negative result does not preclude  SARS-CoV-2 infection and should not be used as the sole basis for treatment or other patient management decisions.  A negative result may occur with improper specimen collection / handling, submission of specimen other than nasopharyngeal swab, presence of viral mutation(s) within the areas targeted by this assay, and inadequate number of viral copies (<250 copies / mL). A negative result must be combined with clinical observations, patient history, and epidemiological information.  Fact Sheet for Patients:   https://www.patel.info/  Fact Sheet for Healthcare Providers: https://hall.com/  This test is not yet approved or  cleared by the  [place] [employer]ural [employer] and has been authorized for detection and/or diagnosis of SARS-CoV-2 by FDA under an Print production planner (EUA).  This EUA will remain in effect (meaning this test can be used) for the duration of the COVID-19 declaration under Section 564(b)(1) of the Act, 21 U.S.C. section 360bbb-3(b)(1), unless the authorization is terminated or revoked sooner.  Performed at [facility], [address] [place], [place] [address]   Urine Culture     Status: Abnormal   Collection Time: 10/12/21  8:25 AM   Specimen: Urine, Catheterized  Result Value Ref Range Status   Specimen Description   Final    URINE, CATHETERIZED Performed at [facility], [address] [place], [place] [address]    Special Requests   Final    NONE Performed at [facility], [place], [place] [address]    Culture >=100,000 COLONIES/mL ESCHERICHIA COLI (A)  Final   Report Status 10/15/2021 FINAL  Final   Organism ID, Bacteria ESCHERICHIA COLI (A)  Final      Susceptibility   Escherichia coli - MIC*    AMPICILLIN >=32 RESISTANT Resistant     CEFAZOLIN 8 SENSITIVE Sensitive     CEFEPIME <=0.12 SENSITIVE Sensitive     CEFTRIAXONE <=0.25 SENSITIVE Sensitive      CIPROFLOXACIN <=0.25 SENSITIVE Sensitive     GENTAMICIN >=16 RESISTANT Resistant     IMIPENEM <=0.25 SENSITIVE Sensitive     NITROFURANTOIN <=16 SENSITIVE Sensitive     TRIMETH/SULFA >=320 RESISTANT Resistant     AMPICILLIN/SULBACTAM >=32 RESISTANT Resistant     PIP/TAZO 64 INTERMEDIATE Intermediate     * >=100,000 COLONIES/mL ESCHERICHIA COLI     Time coordinating discharge: Over 30 minutes  SIGNED:   Sidney Ace, MD  [facility] 10/15/2021, 12:15 PM Pager   If 7PM-7AM, please contact night-coverage

## 2021-10-16 ENCOUNTER — Telehealth: Payer: Self-pay

## 2021-10-16 NOTE — Telephone Encounter (Signed)
Patient does not want to schedule

## 2021-12-04 ENCOUNTER — Ambulatory Visit
Admission: RE | Admit: 2021-12-04 | Discharge: 2021-12-04 | Disposition: A | Discharge status: Home / Self Care | Payer: Medicare Other | Source: Ambulatory Visit | Attending: Gerontology | Admitting: Gerontology

## 2021-12-04 DIAGNOSIS — Z1231 Encounter for screening mammogram for malignant neoplasm of breast: Secondary | ICD-10-CM | POA: Diagnosis present

## 2021-12-05 ENCOUNTER — Inpatient Hospital Stay
Admission: RE | Admit: 2021-12-05 | Discharge: 2021-12-05 | Disposition: A | Discharge status: Home / Self Care | Payer: Self-pay | Source: Ambulatory Visit | Attending: *Deleted | Admitting: *Deleted

## 2021-12-05 ENCOUNTER — Other Ambulatory Visit: Payer: Self-pay | Admitting: *Deleted

## 2021-12-05 ENCOUNTER — Other Ambulatory Visit: Payer: Self-pay | Admitting: Gerontology

## 2021-12-05 DIAGNOSIS — Z1231 Encounter for screening mammogram for malignant neoplasm of breast: Secondary | ICD-10-CM

## 2021-12-05 DIAGNOSIS — R928 Other abnormal and inconclusive findings on diagnostic imaging of breast: Secondary | ICD-10-CM

## 2021-12-18 ENCOUNTER — Inpatient Hospital Stay: Admission: RE | Admit: 2021-12-18 | Payer: Medicare Other | Source: Ambulatory Visit

## 2021-12-18 ENCOUNTER — Other Ambulatory Visit: Payer: Medicare Other

## 2022-01-04 ENCOUNTER — Encounter: Payer: Self-pay | Admitting: Emergency Medicine

## 2022-01-04 ENCOUNTER — Ambulatory Visit
Admission: EM | Admit: 2022-01-04 | Discharge: 2022-01-04 | Disposition: A | Discharge status: Home / Self Care | Payer: Medicare Other

## 2022-01-04 DIAGNOSIS — R112 Nausea with vomiting, unspecified: Secondary | ICD-10-CM | POA: Diagnosis not present

## 2022-01-04 DIAGNOSIS — E1069 Type 1 diabetes mellitus with other specified complication: Secondary | ICD-10-CM | POA: Diagnosis not present

## 2022-01-04 DIAGNOSIS — R739 Hyperglycemia, unspecified: Secondary | ICD-10-CM | POA: Diagnosis not present

## 2022-01-04 DIAGNOSIS — I959 Hypotension, unspecified: Secondary | ICD-10-CM

## 2022-01-04 MED ORDER — ONDANSETRON 4 MG PO TBDP: 4.0000 mg | ORAL_TABLET | Freq: Three times a day (TID) | ORAL | 0 refills | Status: DC | PRN

## 2022-01-04 MED ORDER — ONDANSETRON HCL 4 MG/2ML IJ SOLN
8.0000 mg | Freq: Once | INTRAMUSCULAR | Status: AC
  Administered 2022-01-04: 8 mg via INTRAMUSCULAR

## 2022-01-04 NOTE — Discharge Instructions (Addendum)
-  The blood pressure is low and the heart rate is high.  Blood sugars are high.  This is all very concerning since Diana Frost is a type I diabetic.  I am concerned about dehydration, electrolyte abnormalities and possible diabetic crisis.  I have advised that the best and safest plan would be to go to immediately to the emergency department for control of the blood sugars, IV fluids, further work-up as needed.  You have decided to try the Zofran first but please go to emergency department if she does not make rapid improvement in symptoms.  She should no longer be vomiting or weak.  Blood sugar should be normalizing.  If no improvement in the next couple of hours, please take immediately to the emergency department.  You have been advised to follow up immediately in the emergency department for concerning signs.symptoms. If you declined EMS transport, please have a family member take you directly to the ED at this time. Do not delay. Based on concerns about condition, if you do not follow up in th e ED, you may risk poor outcomes including worsening of condition, delayed treatment and potentially life threatening issues. If you have declined to go to the ED at this time, you should call your PCP immediately to set up a follow up appointment.  Go to ED for red flag symptoms, including; fevers you cannot reduce with Tylenol/Motrin, severe headaches, vision changes, numbness/weakness in part of the body, lethargy, confusion, intractable vomiting, severe dehydration, chest pain, breathing difficulty, severe persistent abdominal or pelvic pain, signs of severe infection (increased redness, swelling of an area), feeling faint or passing out, dizziness, etc. You should especially go to the ED for sudden acute worsening of condition if you do not elect to go at this time.

## 2022-01-04 NOTE — ED Provider Notes (Addendum)
[facility]    CSN: [id number] Arrival date & time: 01/04/22  1532      History   Chief Complaint Chief Complaint  Patient presents with   Emesis    HPI Diana Frost is a 76 y.o. type I diabetic female presenting for nausea/vomiting and headaches that began last night.  Patient is with her daughter who helps to provide some of the history.  Patient has been feeling weak and had persistent nausea and vomiting since last night.  She has had numerous episodes of vomiting.  She has been unable to hold down her midodrine medication which she takes for hypotension.  Patient has a diabetic insulin pump.  She says her blood sugars have been in the 400s today and it has been difficult to try to get them under control.  Patient is denying any cough, congestion or sore throat.  No chest pain, breathing difficulty, abdominal pain or diarrhea.  No sick contacts.  Patient's daughter reports that she had similar symptoms a couple of months ago and had to be admitted for them.  Patient's daughter is hoping that she can get something for her vomiting.  She does have a history of chronic nausea and has about 2 episodes of vomiting per week but her daughter says that she has had numerous episodes over the past several hours and this is out of the ordinary.  Patient is not having any urinary symptoms.  History of DKA, NSTEMI, acute kidney injury, syncope and CAD.    HPI  Past Medical History:  Diagnosis Date   Coronary artery disease    Diabetes mellitus without complication ([place])    insulin pump   Headache    since ear problem started   Hypertension    Myocardial infarction ([place]) 11/01/13    Patient Active Problem List   Diagnosis Date Noted   UTI (urinary tract infection) 10/14/2021   Syncope 10/11/2021   Sinus tachycardia 10/11/2021   Vomiting and diarrhea 10/11/2021   Closed right hip fracture, initial encounter ([place]) 01/07/2019   Malnutrition of moderate degree 08/14/2016    Non-ST elevation (NSTEMI) myocardial infarction ([place]) 08/13/2016   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   AKI (acute kidney injury) ([place]) 08/13/2016   Hypokalemia 08/13/2016   DKA (diabetic ketoacidoses) 08/12/2016   Melanoma ([place]) 11/01/2015   Stable angina 10/10/2015   Coronary artery disease involving native coronary artery of native heart 11/16/2012   History of insertion of insulin pump 05/24/2012   Type 1 diabetes mellitus ([place]) 02/11/2011   Depression 02/11/2011    Past Surgical History:  Procedure Laterality Date   ABDOMINAL HYSTERECTOMY     BACK SURGERY     tail bone removed after fall/fracture   CATARACT EXTRACTION BILATERAL W/ ANTERIOR VITRECTOMY     CORONARY ANGIOPLASTY WITH STENT PLACEMENT  11/01/2013   [facility], stent x2   EYE SURGERY     HIP PINNING,CANNULATED Right 01/07/2019   Procedure: CANNULATED HIP PINNING;  Surgeon: Corky Mull, MD;  Location: [facility];  Service: Orthopedics;  Laterality: Right;   LEFT HEART CATH AND CORONARY ANGIOGRAPHY N/A 08/14/2016   Procedure: Left Heart Cath and Coronary Angiography;  Surgeon: Minna Merritts, MD;  Location: [place] [facility];  Service: Cardiovascular;  Laterality: N/A;   MELANOMA EXCISION Right    arm with skin graft   MYRINGOTOMY WITH TUBE PLACEMENT Left 08/21/2015   Procedure: MYRINGOTOMY WITH TUBE PLACEMENT;  Surgeon: Carloyn Manner, MD;  Location: [place];  Service:  ENT;  Laterality: Left;   NASOPHARYNGEAL BIOPSY N/A 11/07/2015   Procedure: NASOPHARYNGEAL BIOPSY;  Surgeon: Carloyn Manner, MD;  Location: [facility];  Service: ENT;  Laterality: N/A;   TONSILLECTOMY      OB History   No obstetric history on file.      Home Medications    Prior to Admission medications   Medication Sig Start Date End Date Taking? Authorizing Provider  alendronate (FOSAMAX) 70 MG tablet Take 70 mg by mouth once a week. 11/23/20  Yes [provider]  aspirin 81 MG tablet Take 81 mg by mouth daily.    Yes [provider]  DULoxetine (CYMBALTA) 60 MG capsule Take 60 mg by mouth daily. 09/25/21  Yes [provider]  insulin lispro (HUMALOG) 100 UNIT/ML injection Inject 0-23 Units into the skin daily. VIA PUMP 09/29/18  Yes [provider]  ondansetron (ZOFRAN-ODT) 4 MG disintegrating tablet Take 1 tablet (4 mg total) by mouth every 8 (eight) hours as needed for nausea or vomiting. 01/04/22  Yes Laurene Footman B, PA-C  rosuvastatin (CRESTOR) 10 MG tablet Take 10 mg by mouth at bedtime. 12/05/18  Yes [provider]  acetaminophen (TYLENOL) 325 MG tablet Take 2 tablets (650 mg total) by mouth every 6 (six) hours as needed for mild pain, fever or headache (pain score 1-3 or temp > 100.5). Patient not taking: Reported on 10/11/2021 01/10/19   Gladstone Lighter, MD  docusate sodium (COLACE) 100 MG capsule Take 1 capsule (100 mg total) by mouth 2 (two) times daily. 01/10/19   Gladstone Lighter, MD  DULoxetine (CYMBALTA) 20 MG capsule Take 40 mg by mouth as directed. Take 1 capsule (20 mg total) by mouth once daily for 14 days, THEN 2 capsules (40 mg total) once daily for 14 days.1-2 09/25/21 01/04/22  [provider]  Glucagon, rDNA, (GLUCAGON EMERGENCY IJ) Inject as directed as needed (for low blood sugar).    [provider]  midodrine (PROAMATINE) 5 MG tablet Take 5 mg by mouth 3 (three) times daily. 12/15/21   [provider]    Family History Family History  Problem Relation Age of Onset   Lung cancer Father    Breast cancer Maternal Aunt     Social History Social History   Tobacco Use   Smoking status: Former    Types: Cigarettes    Quit date: 04/30/1998    Years since quitting: 23.6   Smokeless tobacco: Never  Vaping Use   Vaping Use: Never used  Substance Use Topics   Alcohol use: Yes    Alcohol/week: 5.0 standard drinks of alcohol    Types: 5 Glasses of wine per week    Comment:     Drug use: No     Allergies    Bupropion, Percocet [oxycodone-acetaminophen], Statins, and Sulfa antibiotics   Review of Systems Review of Systems  Constitutional:  Positive for appetite change and fatigue. Negative for chills, diaphoresis and fever.  HENT:  Negative for congestion, ear pain, rhinorrhea and sore throat.   Respiratory:  Negative for cough and shortness of breath.   Cardiovascular:  Negative for chest pain.  Gastrointestinal:  Positive for nausea and vomiting. Negative for abdominal pain and diarrhea.  Genitourinary:  Negative for difficulty urinating, dysuria, frequency and urgency.  Musculoskeletal:  Negative for arthralgias and myalgias.  Skin:  Negative for rash.  Neurological:  Positive for weakness and headaches. Negative for dizziness.  Hematological:  Negative for adenopathy.     Physical Exam  Triage Vital Signs ED Triage Vitals  Enc Vitals Group     BP 01/04/22 1549 (!) 90/51     Pulse Rate 01/04/22 1549 (!) 115     Resp --      Temp 01/04/22 1549 98.2 F (36.8 C)     Temp Source 01/04/22 1549 Oral     SpO2 01/04/22 1549 99 %     Weight 01/04/22 1545 130 lb 1.1 oz (59 kg)     Height 01/04/22 1545 '5\' 2"'$  (1.575 m)     Head Circumference --      Peak Flow --      Pain Score 01/04/22 1545 0     Pain Loc --      Pain Edu? --      Excl. in [place]? --    No data found.  Updated Vital Signs BP (!) 90/51 (BP Location: Left Arm)   Pulse (!) 115   Temp 98.2 F (36.8 C) (Oral)   Ht '5\' 2"'$  (1.575 m)   Wt 130 lb 1.1 oz (59 kg)   SpO2 99%   BMI 23.79 kg/m      Physical Exam Vitals and nursing note reviewed.  Constitutional:      General: She is not in acute distress.    Appearance: Normal appearance. She is ill-appearing.  HENT:     Head: Normocephalic and atraumatic.     Nose: Nose normal.     Mouth/Throat:     Mouth: Mucous membranes are dry.     Pharynx: Oropharynx is clear.  Eyes:     General: No scleral icterus.       Right eye: No discharge.        Left eye: No  discharge.     Conjunctiva/sclera: Conjunctivae normal.  Cardiovascular:     Rate and Rhythm: Regular rhythm. Tachycardia present.     Heart sounds: Normal heart sounds.  Pulmonary:     Effort: Pulmonary effort is normal. No respiratory distress.     Breath sounds: Normal breath sounds.  Abdominal:     Palpations: Abdomen is soft.     Tenderness: There is no abdominal tenderness. There is no right CVA tenderness or left CVA tenderness.  Musculoskeletal:     Cervical back: Neck supple.  Skin:    General: Skin is dry.  Neurological:     General: No focal deficit present.     Mental Status: She is alert. Mental status is at baseline.     Motor: No weakness.     Gait: Gait normal.  Psychiatric:        Mood and Affect: Mood normal.        Behavior: Behavior normal.        Thought Content: Thought content normal.      UC Treatments / Results  Labs (all labs ordered are listed, but only abnormal results are displayed) Labs Reviewed - No data to display  EKG   Radiology No results found.  Procedures Procedures (including critical care time)  Medications Ordered in UC Medications  ondansetron (ZOFRAN) injection 8 mg (has no administration in time range)    Initial Impression / Assessment and Plan / UC Course  I have reviewed the triage vital signs and the nursing notes.  Pertinent labs & imaging results that were available during my care of the patient were reviewed by me and considered in my medical decision making (see chart for details).   76 year old type I diabetic female presents with  her daughter for nausea/vomiting, headaches, fatigue and weakness since last night.  Patient has had multiple episodes of vomiting.  Has been unable to hold down her medications including midodrine and her blood pressure is low today at 90/51.  Patient does have a history of chronic nausea but usually about 2 times per week and she has had numerous episodes since last night.  Her blood  sugars have been in the 400s and is currently in the 400s per her insulin pump.  No URI symptoms, chest pain, breathing difficulty, abdominal pain, diarrhea, urinary symptoms.  Pulse elevated at 115 bpm.  Patient is ill-appearing but not in any distress.  She is sitting in a wheelchair and appears to be fatigued.  Her mouth is dry.  Her throat is clear.  Her chest is clear to auscultation heart regular rhythm.  Abdomen is soft and nontender.  I had a discussion with patient and her daughter and advised that she should go to the emergency department for the symptoms.  I do have concerns that she is bordering on a diabetic crisis.  Her blood sugars are in the 400s, her mouth is dry and she is likely dehydrated.  I also question if she has electrolyte abnormalities as well.  Patient and daughter do not want to go to the ER at this time.  Patient has been admitted for the symptoms before and been in diabetic crisis.  Patient's daughter request an injection for the nausea and vomiting so patient is given 8 mg IM Zofran in the clinic.  I sent a very short supply of ODT Zofran.  Advised if patient is not making a very rapid improvement in her symptoms and her blood sugars normalizing over the next several hours she should immediately be taken to the emergency department without any delay.  I thoroughly discussed with patient and daughter that it would be best for her to go at this time but they are declining to go right at this time as the daughter has to look after the patient's husband who is bedbound.  Patient's daughter says that if she is not quickly improving she will take her to the emergency department.  Patient is leaving in stable condition.  Hopefully the nausea medication will work and she can take her midodrine which hopefully will raise her blood pressure and she will be able to increase her fluid intake and the blood sugars normalized but I suspect the patient will likely end up going to the emergency  room and may be admitted given her past medical history.   Final Clinical Impressions(s) / UC Diagnoses   Final diagnoses:  Nausea and vomiting, unspecified vomiting type  Hyperglycemia  Type 1 diabetes mellitus with other specified complication (HCC)  Hypotension, unspecified hypotension type     Discharge Instructions      -The blood pressure is low and the heart rate is high.  Blood sugars are high.  This is all very concerning since Nazariah is a type I diabetic.  I am concerned about dehydration, electrolyte abnormalities and possible diabetic crisis.  I have advised that the best and safest plan would be to go to immediately to the emergency department for control of the blood sugars, IV fluids, further work-up as needed.  You have decided to try the Zofran first but please go to emergency department if she does not make rapid improvement in symptoms.  She should no longer be vomiting or weak.  Blood sugar should be normalizing.  If  no improvement in the next couple of hours, please take immediately to the emergency department.  You have been advised to follow up immediately in the emergency department for concerning signs.symptoms. If you declined EMS transport, please have a family member take you directly to the ED at this time. Do not delay. Based on concerns about condition, if you do not follow up in th e ED, you may risk poor outcomes including worsening of condition, delayed treatment and potentially life threatening issues. If you have declined to go to the ED at this time, you should call your PCP immediately to set up a follow up appointment.  Go to ED for red flag symptoms, including; fevers you cannot reduce with Tylenol/Motrin, severe headaches, vision changes, numbness/weakness in part of the body, lethargy, confusion, intractable vomiting, severe dehydration, chest pain, breathing difficulty, severe persistent abdominal or pelvic pain, signs of severe infection (increased  redness, swelling of an area), feeling faint or passing out, dizziness, etc. You should especially go to the ED for sudden acute worsening of condition if you do not elect to go at this time.      ED Prescriptions     Medication Sig Dispense Auth. Provider   ondansetron (ZOFRAN-ODT) 4 MG disintegrating tablet Take 1 tablet (4 mg total) by mouth every 8 (eight) hours as needed for nausea or vomiting. 10 tablet Gretta Cool      PDMP not reviewed this encounter.   Danton Clap, PA-C 01/04/22 1616    Laurene Footman B, PA-C 01/04/22 [phone]

## 2022-01-04 NOTE — ED Triage Notes (Signed)
Patient c/o vomiting and headache that started last night.  Patient denies cold symptoms.  Patient denies urinary symptoms.

## 2022-03-28 ENCOUNTER — Emergency Department: Payer: Medicare Other

## 2022-03-28 ENCOUNTER — Other Ambulatory Visit: Payer: Self-pay

## 2022-03-28 ENCOUNTER — Inpatient Hospital Stay
Admission: EM | Admit: 2022-03-28 | Discharge: 2022-04-02 | DRG: 481 | Disposition: A | Discharge status: Rehabilitation Facility | Payer: Medicare Other | Attending: Internal Medicine | Admitting: Internal Medicine

## 2022-03-28 ENCOUNTER — Encounter: Payer: Self-pay | Admitting: Radiology

## 2022-03-28 DIAGNOSIS — Z801 Family history of malignant neoplasm of trachea, bronchus and lung: Secondary | ICD-10-CM

## 2022-03-28 DIAGNOSIS — E1059 Type 1 diabetes mellitus with other circulatory complications: Secondary | ICD-10-CM | POA: Diagnosis not present

## 2022-03-28 DIAGNOSIS — Z885 Allergy status to narcotic agent status: Secondary | ICD-10-CM | POA: Diagnosis not present

## 2022-03-28 DIAGNOSIS — Z7983 Long term (current) use of bisphosphonates: Secondary | ICD-10-CM

## 2022-03-28 DIAGNOSIS — I252 Old myocardial infarction: Secondary | ICD-10-CM

## 2022-03-28 DIAGNOSIS — Z955 Presence of coronary angioplasty implant and graft: Secondary | ICD-10-CM | POA: Diagnosis not present

## 2022-03-28 DIAGNOSIS — S72002D Fracture of unspecified part of neck of left femur, subsequent encounter for closed fracture with routine healing: Secondary | ICD-10-CM | POA: Diagnosis not present

## 2022-03-28 DIAGNOSIS — Z882 Allergy status to sulfonamides status: Secondary | ICD-10-CM | POA: Diagnosis not present

## 2022-03-28 DIAGNOSIS — S72012A Unspecified intracapsular fracture of left femur, initial encounter for closed fracture: Secondary | ICD-10-CM | POA: Diagnosis present

## 2022-03-28 DIAGNOSIS — E109 Type 1 diabetes mellitus without complications: Secondary | ICD-10-CM | POA: Diagnosis not present

## 2022-03-28 DIAGNOSIS — Z888 Allergy status to other drugs, medicaments and biological substances status: Secondary | ICD-10-CM | POA: Diagnosis not present

## 2022-03-28 DIAGNOSIS — Z79899 Other long term (current) drug therapy: Secondary | ICD-10-CM

## 2022-03-28 DIAGNOSIS — M1611 Unilateral primary osteoarthritis, right hip: Secondary | ICD-10-CM | POA: Diagnosis present

## 2022-03-28 DIAGNOSIS — E1065 Type 1 diabetes mellitus with hyperglycemia: Secondary | ICD-10-CM | POA: Diagnosis present

## 2022-03-28 DIAGNOSIS — Z87891 Personal history of nicotine dependence: Secondary | ICD-10-CM

## 2022-03-28 DIAGNOSIS — K5900 Constipation, unspecified: Secondary | ICD-10-CM | POA: Diagnosis not present

## 2022-03-28 DIAGNOSIS — S728X2S Other fracture of left femur, sequela: Secondary | ICD-10-CM | POA: Diagnosis not present

## 2022-03-28 DIAGNOSIS — Y92009 Unspecified place in unspecified non-institutional (private) residence as the place of occurrence of the external cause: Secondary | ICD-10-CM | POA: Diagnosis not present

## 2022-03-28 DIAGNOSIS — I10 Essential (primary) hypertension: Secondary | ICD-10-CM | POA: Diagnosis present

## 2022-03-28 DIAGNOSIS — S72009A Fracture of unspecified part of neck of unspecified femur, initial encounter for closed fracture: Secondary | ICD-10-CM | POA: Diagnosis present

## 2022-03-28 DIAGNOSIS — Z9641 Presence of insulin pump (external) (internal): Secondary | ICD-10-CM | POA: Diagnosis present

## 2022-03-28 DIAGNOSIS — I2510 Atherosclerotic heart disease of native coronary artery without angina pectoris: Secondary | ICD-10-CM | POA: Diagnosis present

## 2022-03-28 DIAGNOSIS — R5381 Other malaise: Secondary | ICD-10-CM | POA: Diagnosis present

## 2022-03-28 DIAGNOSIS — W1830XA Fall on same level, unspecified, initial encounter: Secondary | ICD-10-CM | POA: Diagnosis present

## 2022-03-28 DIAGNOSIS — Z803 Family history of malignant neoplasm of breast: Secondary | ICD-10-CM

## 2022-03-28 DIAGNOSIS — E538 Deficiency of other specified B group vitamins: Secondary | ICD-10-CM | POA: Diagnosis present

## 2022-03-28 DIAGNOSIS — D62 Acute posthemorrhagic anemia: Secondary | ICD-10-CM | POA: Diagnosis not present

## 2022-03-28 DIAGNOSIS — M80051A Age-related osteoporosis with current pathological fracture, right femur, initial encounter for fracture: Secondary | ICD-10-CM | POA: Diagnosis present

## 2022-03-28 DIAGNOSIS — K5901 Slow transit constipation: Secondary | ICD-10-CM | POA: Diagnosis not present

## 2022-03-28 DIAGNOSIS — S72002A Fracture of unspecified part of neck of left femur, initial encounter for closed fracture: Secondary | ICD-10-CM | POA: Diagnosis not present

## 2022-03-28 DIAGNOSIS — D649 Anemia, unspecified: Secondary | ICD-10-CM | POA: Diagnosis present

## 2022-03-28 DIAGNOSIS — E876 Hypokalemia: Secondary | ICD-10-CM | POA: Diagnosis present

## 2022-03-28 DIAGNOSIS — S72142A Displaced intertrochanteric fracture of left femur, initial encounter for closed fracture: Secondary | ICD-10-CM | POA: Diagnosis present

## 2022-03-28 DIAGNOSIS — Z794 Long term (current) use of insulin: Secondary | ICD-10-CM | POA: Diagnosis not present

## 2022-03-28 DIAGNOSIS — E1042 Type 1 diabetes mellitus with diabetic polyneuropathy: Secondary | ICD-10-CM | POA: Diagnosis not present

## 2022-03-28 DIAGNOSIS — Z1152 Encounter for screening for COVID-19: Secondary | ICD-10-CM | POA: Diagnosis not present

## 2022-03-28 DIAGNOSIS — E785 Hyperlipidemia, unspecified: Secondary | ICD-10-CM | POA: Diagnosis present

## 2022-03-28 DIAGNOSIS — Z85820 Personal history of malignant melanoma of skin: Secondary | ICD-10-CM

## 2022-03-28 DIAGNOSIS — I959 Hypotension, unspecified: Secondary | ICD-10-CM | POA: Diagnosis not present

## 2022-03-28 DIAGNOSIS — E10649 Type 1 diabetes mellitus with hypoglycemia without coma: Secondary | ICD-10-CM | POA: Diagnosis not present

## 2022-03-28 DIAGNOSIS — R000 Tachycardia, unspecified: Secondary | ICD-10-CM | POA: Diagnosis not present

## 2022-03-28 DIAGNOSIS — Z7982 Long term (current) use of aspirin: Secondary | ICD-10-CM

## 2022-03-28 LAB — PROTIME-INR
INR: 1.1 (ref 0.8–1.2)
Prothrombin Time: 13.7 seconds (ref 11.4–15.2)

## 2022-03-28 LAB — COMPREHENSIVE METABOLIC PANEL
ALT: 17 U/L (ref 0–44)
AST: 26 U/L (ref 15–41)
Albumin: 3.7 g/dL (ref 3.5–5.0)
Alkaline Phosphatase: 58 U/L (ref 38–126)
Anion gap: 10 (ref 5–15)
BUN: 17 mg/dL (ref 8–23)
CO2: 26 mmol/L (ref 22–32)
Calcium: 9.1 mg/dL (ref 8.9–10.3)
Chloride: 103 mmol/L (ref 98–111)
Creatinine, Ser: 0.94 mg/dL (ref 0.44–1.00)
GFR, Estimated: >60 mL/min (ref >60)
Glucose, Bld: 134 mg/dL — ABNORMAL HIGH (ref 70–99)
Potassium: 3.4 mmol/L — ABNORMAL LOW (ref 3.5–5.1)
Sodium: 139 mmol/L (ref 135–145)
Total Bilirubin: 1.1 mg/dL (ref 0.3–1.2)
Total Protein: 6.6 g/dL (ref 6.5–8.1)

## 2022-03-28 LAB — CBC WITH DIFFERENTIAL/PLATELET
Abs Immature Granulocytes: 0.03 10*3/uL (ref 0.00–0.07)
Basophils Absolute: 0 10*3/uL (ref 0.0–0.1)
Basophils Relative: 1 %
Eosinophils Absolute: 0.1 10*3/uL (ref 0.0–0.5)
Eosinophils Relative: 1 %
HCT: 35 % — ABNORMAL LOW (ref 36.0–46.0)
Hemoglobin: 11.2 g/dL — ABNORMAL LOW (ref 12.0–15.0)
Immature Granulocytes: 0 %
Lymphocytes Relative: 12 %
Lymphs Abs: 1 10*3/uL (ref 0.7–4.0)
MCH: 31 pg (ref 26.0–34.0)
MCHC: 32 g/dL (ref 30.0–36.0)
MCV: 97 fL (ref 80.0–100.0)
Monocytes Absolute: 0.6 10*3/uL (ref 0.1–1.0)
Monocytes Relative: 6 %
Neutro Abs: 7.1 10*3/uL (ref 1.7–7.7)
Neutrophils Relative %: 80 %
Platelets: 201 10*3/uL (ref 150–400)
RBC: 3.61 MIL/uL — ABNORMAL LOW (ref 3.87–5.11)
RDW: 14.3 % (ref 11.5–15.5)
WBC: 8.9 10*3/uL (ref 4.0–10.5)
nRBC: 0 % (ref 0.0–0.2)

## 2022-03-28 LAB — URINALYSIS, ROUTINE W REFLEX MICROSCOPIC
Bilirubin Urine: NEGATIVE
Glucose, UA: 50 mg/dL — AB
Hgb urine dipstick: NEGATIVE
Ketones, ur: NEGATIVE mg/dL
Leukocytes,Ua: NEGATIVE
Nitrite: NEGATIVE
Protein, ur: NEGATIVE mg/dL
Specific Gravity, Urine: 1.011 (ref 1.005–1.030)
pH: 6 (ref 5.0–8.0)

## 2022-03-28 MED ORDER — ONDANSETRON HCL 4 MG/2ML IJ SOLN
4.0000 mg | Freq: Once | INTRAMUSCULAR | Status: AC
  Administered 2022-03-28: 4 mg via INTRAVENOUS
  Filled 2022-03-28: qty 2

## 2022-03-28 MED ORDER — CHLORHEXIDINE GLUCONATE 4 % EX LIQD: Freq: Once | CUTANEOUS | Status: DC

## 2022-03-28 MED ORDER — MORPHINE SULFATE (PF) 2 MG/ML IV SOLN
1.0000 mg | INTRAVENOUS | Status: DC | PRN
  Filled 2022-03-28: qty 1

## 2022-03-28 MED ORDER — MORPHINE SULFATE (PF) 4 MG/ML IV SOLN
4.0000 mg | Freq: Once | INTRAVENOUS | Status: AC
  Administered 2022-03-28: 4 mg via INTRAVENOUS
  Filled 2022-03-28: qty 1

## 2022-03-28 MED ORDER — SODIUM CHLORIDE 0.9 % IV SOLN: INTRAVENOUS | Status: DC

## 2022-03-28 MED ORDER — CEFAZOLIN SODIUM-DEXTROSE 2-4 GM/100ML-% IV SOLN
2.0000 g | INTRAVENOUS | Status: AC
  Administered 2022-03-29: 2 g via INTRAVENOUS
  Filled 2022-03-28: qty 100

## 2022-03-28 NOTE — ED Provider Notes (Signed)
[facility] Provider Note  Patient Contact: 9:46 PM (approximate)   History   Hip Pain   HPI  Diana Frost is a 76 y.o. female with a history of coronary artery disease, hypertension, diabetes, presents to the emergency department by EMS.  Patient had a mechanical fall and is complaining of left hip pain.  She denies chest pain, chest tightness or shortness of breath.  She is not currently anticoagulated.  Patient has been unable to bear weight since injury occurred.      Physical Exam   Triage Vital Signs: ED Triage Vitals  Enc Vitals Group     BP 03/28/22 1858 (!) 151/80     Pulse Rate 03/28/22 1858 88     Resp 03/28/22 1858 16     Temp 03/28/22 1858 98 F (36.7 C)     Temp Source 03/28/22 1858 Oral     SpO2 03/28/22 1858 98 %     Weight 03/28/22 1859 117 lb (53.1 kg)     Height 03/28/22 1859 '5\' 2"'$  (1.575 m)     Head Circumference --      Peak Flow --      Pain Score 03/28/22 1859 8     Pain Loc --      Pain Edu? --      Excl. in [place]? --     Most recent vital signs: Vitals:   03/28/22 1858  BP: (!) 151/80  Pulse: 88  Resp: 16  Temp: 98 F (36.7 C)  SpO2: 98%     General: Alert and in no acute distress. Eyes:  PERRL. EOMI. Head: No acute traumatic findings ENT:      Nose: No congestion/rhinnorhea.      Mouth/Throat: Mucous membranes are moist.  Neck: No stridor. No cervical spine tenderness to palpation. Cardiovascular:  Good peripheral perfusion Respiratory: Normal respiratory effort without tachypnea or retractions. Lungs CTAB. Good air entry to the bases with no decreased or absent breath sounds. Gastrointestinal: Bowel sounds 4 quadrants. Soft and nontender to palpation. No guarding or rigidity. No palpable masses. No distention. No CVA tenderness. Musculoskeletal: Patient performs limited range of motion at the left hip.  Palpable dorsalis pedis pulse bilaterally and symmetrically.  Capillary refill less than 2 seconds  on the left. Neurologic:  No gross focal neurologic deficits are appreciated.  Skin:   No rash noted Other:   ED Results / Procedures / Treatments   Labs (all labs ordered are listed, but only abnormal results are displayed) Labs Reviewed  CBC WITH DIFFERENTIAL/PLATELET - Abnormal; Notable for the following components:      Result Value   RBC 3.61 (*)    Hemoglobin 11.2 (*)    HCT 35.0 (*)    All other components within normal limits  URINALYSIS, ROUTINE W REFLEX MICROSCOPIC - Abnormal; Notable for the following components:   Color, Urine YELLOW (*)    APPearance CLEAR (*)    Glucose, UA 50 (*)    All other components within normal limits  PROTIME-INR  COMPREHENSIVE METABOLIC PANEL  TYPE AND SCREEN       RADIOLOGY  I personally viewed and evaluated these images as part of my medical decision making, as well as reviewing the written report by the radiologist.  ED Provider Interpretation: Patient has an impacted subcapital left femoral neck fracture   PROCEDURES:  Critical Care performed: No  Procedures   MEDICATIONS ORDERED IN ED: Medications  0.9 %  sodium chloride infusion (  Intravenous New Bag/Given 03/28/22 2301)  ceFAZolin (ANCEF) IVPB 2g/100 mL premix (has no administration in time range)  chlorhexidine (HIBICLENS) 4 % liquid (has no administration in time range)  morphine (PF) 2 MG/ML injection 1 mg (has no administration in time range)  morphine (PF) 4 MG/ML injection 4 mg (4 mg Intravenous Given 03/28/22 2152)  ondansetron (ZOFRAN) injection 4 mg (4 mg Intravenous Given 03/28/22 2152)     IMPRESSION / MDM / ASSESSMENT AND PLAN / ED COURSE  I reviewed the triage vital signs and the nursing notes.                              Assessment and plan:  Hip fracture:  76 year old female presents to the emergency department with acute left hip pain after mechanical fall.   Patient was hypertensive at triage but vital signs otherwise  reassuring.  Patient has a impacted subcapital left femoral neck fracture.  I reached out to orthopedist on-call, Dr. Sabra Heck who anticipates taking patient to the OR in the morning at 1045.  Patient was made n.p.o. after midnight and morphine was given for pain.  Patient was accepted for admission by hospitalist service.     FINAL CLINICAL IMPRESSION(S) / ED DIAGNOSES   Final diagnoses:  Closed fracture of left hip, initial encounter [facility])     Rx / DC Orders   ED Discharge Orders     None        Note:  This document was prepared using Dragon voice recognition software and may include unintentional dictation errors.   Vallarie Mare [place], PA-C 03/28/22 2313    Lucillie Garfinkel, MD 03/30/22 [phone]

## 2022-03-28 NOTE — ED Notes (Signed)
Pts hearing aids taken home by patients daughter.

## 2022-03-28 NOTE — ED Notes (Signed)
Report given to [place], [place]

## 2022-03-28 NOTE — ED Triage Notes (Signed)
Pt to ED from home BY EMS per pt. Pt had mechanical fall. Pt denies hitting her head. Pt complaint of pain in left hip. Pt is CAOx4 and in no acute distress. Pt in ED bed at this time.

## 2022-03-28 NOTE — ED Provider Triage Note (Signed)
Emergency Medicine Provider Triage Evaluation Note  Diana Frost , a 76 y.o. female  was evaluated in triage.  Pt complains of fall with L hip pain. Mechanical fall. Did not hit head or lose consciousness. Only complaint is L hip pain.  Review of Systems  Positive: L hip pain s/p fall Negative: Head injury, other msk pain  Physical Exam  There were no vitals taken for this visit. Gen:   Awake, no distress   Resp:  Normal effort  MSK:   Limited ROM of LLE. TTP L hip Other:    Medical Decision Making  Medically screening exam initiated at 6:40 PM.  Appropriate orders placed.  Diana Frost was informed that the remainder of the evaluation will be completed by another provider, this initial triage assessment does not replace that evaluation, and the importance of remaining in the ED until their evaluation is complete.  Xrays   Diana Moll, PA-C 03/28/22 1840

## 2022-03-28 NOTE — ED Triage Notes (Signed)
First Nurse Note;  Pt via POV from home. Pt had a mechanical fall. Denies LOC. Denies head injury. Denies blood thinners. Pt c/o L hip pain. Pt was able to stand shortly. No obvious deformities. VSS. Pt is A&OX4 and NAD

## 2022-03-29 ENCOUNTER — Encounter
Admission: EM | Disposition: A | Discharge status: Rehabilitation Facility | Payer: Self-pay | Source: Home / Self Care | Attending: Hospitalist

## 2022-03-29 ENCOUNTER — Other Ambulatory Visit: Payer: Self-pay

## 2022-03-29 ENCOUNTER — Inpatient Hospital Stay: Payer: Medicare Other

## 2022-03-29 ENCOUNTER — Encounter: Payer: Self-pay | Admitting: Internal Medicine

## 2022-03-29 ENCOUNTER — Inpatient Hospital Stay: Payer: Medicare Other | Admitting: Anesthesiology

## 2022-03-29 DIAGNOSIS — I2510 Atherosclerotic heart disease of native coronary artery without angina pectoris: Secondary | ICD-10-CM

## 2022-03-29 DIAGNOSIS — S72002A Fracture of unspecified part of neck of left femur, initial encounter for closed fracture: Secondary | ICD-10-CM | POA: Diagnosis not present

## 2022-03-29 DIAGNOSIS — E1059 Type 1 diabetes mellitus with other circulatory complications: Secondary | ICD-10-CM | POA: Diagnosis not present

## 2022-03-29 DIAGNOSIS — D649 Anemia, unspecified: Secondary | ICD-10-CM | POA: Diagnosis not present

## 2022-03-29 DIAGNOSIS — E876 Hypokalemia: Secondary | ICD-10-CM

## 2022-03-29 HISTORY — PX: HIP PINNING,CANNULATED: SHX1758

## 2022-03-29 LAB — GLUCOSE, CAPILLARY
Glucose-Capillary: 124 mg/dL — ABNORMAL HIGH (ref 70–99)
Glucose-Capillary: 126 mg/dL — ABNORMAL HIGH (ref 70–99)
Glucose-Capillary: 150 mg/dL — ABNORMAL HIGH (ref 70–99)

## 2022-03-29 LAB — CBC
HCT: 31.2 % — ABNORMAL LOW (ref 36.0–46.0)
Hemoglobin: 9.9 g/dL — ABNORMAL LOW (ref 12.0–15.0)
MCH: 31.1 pg (ref 26.0–34.0)
MCHC: 31.7 g/dL (ref 30.0–36.0)
MCV: 98.1 fL (ref 80.0–100.0)
Platelets: 159 10*3/uL (ref 150–400)
RBC: 3.18 MIL/uL — ABNORMAL LOW (ref 3.87–5.11)
RDW: 14.4 % (ref 11.5–15.5)
WBC: 5.6 10*3/uL (ref 4.0–10.5)
nRBC: 0 % (ref 0.0–0.2)

## 2022-03-29 LAB — BASIC METABOLIC PANEL
Anion gap: 6 (ref 5–15)
BUN: 16 mg/dL (ref 8–23)
CO2: 27 mmol/L (ref 22–32)
Calcium: 8.3 mg/dL — ABNORMAL LOW (ref 8.9–10.3)
Chloride: 110 mmol/L (ref 98–111)
Creatinine, Ser: 0.83 mg/dL (ref 0.44–1.00)
GFR, Estimated: >60 mL/min (ref >60)
Glucose, Bld: 110 mg/dL — ABNORMAL HIGH (ref 70–99)
Potassium: 3.7 mmol/L (ref 3.5–5.1)
Sodium: 143 mmol/L (ref 135–145)

## 2022-03-29 LAB — TYPE AND SCREEN
ABO/RH(D): O POS
Antibody Screen: NEGATIVE

## 2022-03-29 LAB — CBG MONITORING, ED
Glucose-Capillary: 105 mg/dL — ABNORMAL HIGH (ref 70–99)
Glucose-Capillary: 107 mg/dL — ABNORMAL HIGH (ref 70–99)
Glucose-Capillary: 112 mg/dL — ABNORMAL HIGH (ref 70–99)
Glucose-Capillary: 142 mg/dL — ABNORMAL HIGH (ref 70–99)

## 2022-03-29 SURGERY — FIXATION, FEMUR, NECK, PERCUTANEOUS, USING SCREW
Anesthesia: General | Site: Hip | Laterality: Left

## 2022-03-29 MED ORDER — SENNA 8.6 MG PO TABS
1.0000 | ORAL_TABLET | Freq: Two times a day (BID) | ORAL | Status: DC
  Administered 2022-03-29 – 2022-04-02 (×9): 8.6 mg via ORAL
  Filled 2022-03-29 (×9): qty 1

## 2022-03-29 MED ORDER — HYDROCODONE-ACETAMINOPHEN 7.5-325 MG PO TABS
1.0000 | ORAL_TABLET | ORAL | Status: DC | PRN
  Administered 2022-03-31 (×3): 1 via ORAL
  Administered 2022-04-01: 2 via ORAL
  Filled 2022-03-29: qty 1
  Filled 2022-03-29: qty 2
  Filled 2022-03-29 (×2): qty 1

## 2022-03-29 MED ORDER — LACTATED RINGERS IV SOLN: INTRAVENOUS | Status: DC

## 2022-03-29 MED ORDER — ROSUVASTATIN CALCIUM 10 MG PO TABS
10.0000 mg | ORAL_TABLET | Freq: Every day | ORAL | Status: DC
  Administered 2022-03-29 – 2022-04-01 (×4): 10 mg via ORAL
  Filled 2022-03-29 (×5): qty 1

## 2022-03-29 MED ORDER — PHENYLEPHRINE 80 MCG/ML (10ML) SYRINGE FOR IV PUSH (FOR BLOOD PRESSURE SUPPORT)
PREFILLED_SYRINGE | INTRAVENOUS | Status: DC | PRN
  Administered 2022-03-29: 160 ug via INTRAVENOUS

## 2022-03-29 MED ORDER — HYDROCODONE-ACETAMINOPHEN 5-325 MG PO TABS
1.0000 | ORAL_TABLET | ORAL | Status: DC | PRN
  Administered 2022-03-29 – 2022-03-30 (×3): 2 via ORAL
  Filled 2022-03-29 (×3): qty 2

## 2022-03-29 MED ORDER — ENOXAPARIN SODIUM 30 MG/0.3ML IJ SOSY
30.0000 mg | PREFILLED_SYRINGE | INTRAMUSCULAR | Status: DC
  Administered 2022-03-30: 30 mg via SUBCUTANEOUS
  Filled 2022-03-29: qty 0.3

## 2022-03-29 MED ORDER — PROPOFOL 10 MG/ML IV BOLUS
INTRAVENOUS | Status: DC | PRN
  Administered 2022-03-29: 80 mg via INTRAVENOUS

## 2022-03-29 MED ORDER — DIPHENHYDRAMINE HCL 50 MG/ML IJ SOLN: 25.0000 mg | Freq: Four times a day (QID) | INTRAMUSCULAR | Status: DC | PRN

## 2022-03-29 MED ORDER — BUPIVACAINE-EPINEPHRINE (PF) 0.5% -1:200000 IJ SOLN
INTRAMUSCULAR | Status: AC
  Filled 2022-03-29: qty 30

## 2022-03-29 MED ORDER — PROPOFOL 500 MG/50ML IV EMUL
INTRAVENOUS | Status: DC | PRN
  Administered 2022-03-29: 100 ug/kg/min via INTRAVENOUS

## 2022-03-29 MED ORDER — BUPIVACAINE-EPINEPHRINE (PF) 0.5% -1:200000 IJ SOLN
INTRAMUSCULAR | Status: DC | PRN
  Administered 2022-03-29: 30 mL

## 2022-03-29 MED ORDER — ONDANSETRON HCL 4 MG/2ML IJ SOLN: 4.0000 mg | Freq: Four times a day (QID) | INTRAMUSCULAR | Status: DC | PRN

## 2022-03-29 MED ORDER — 0.9 % SODIUM CHLORIDE (POUR BTL) OPTIME
TOPICAL | Status: DC | PRN
  Administered 2022-03-29: 1000 mL

## 2022-03-29 MED ORDER — BISACODYL 10 MG RE SUPP: 10.0000 mg | Freq: Every day | RECTAL | Status: DC | PRN

## 2022-03-29 MED ORDER — ALUM & MAG HYDROXIDE-SIMETH 200-200-20 MG/5ML PO SUSP: 30.0000 mL | ORAL | Status: DC | PRN

## 2022-03-29 MED ORDER — SENNOSIDES-DOCUSATE SODIUM 8.6-50 MG PO TABS: 1.0000 | ORAL_TABLET | Freq: Every evening | ORAL | Status: DC | PRN

## 2022-03-29 MED ORDER — MAGNESIUM HYDROXIDE 400 MG/5ML PO SUSP
30.0000 mL | Freq: Every day | ORAL | Status: DC | PRN
  Administered 2022-03-31: 30 mL via ORAL
  Filled 2022-03-29: qty 30

## 2022-03-29 MED ORDER — PROPOFOL 1000 MG/100ML IV EMUL
INTRAVENOUS | Status: AC
  Filled 2022-03-29: qty 100

## 2022-03-29 MED ORDER — ACETAMINOPHEN 10 MG/ML IV SOLN
INTRAVENOUS | Status: AC
  Administered 2022-03-29: 1000 mg via INTRAVENOUS
  Filled 2022-03-29: qty 100

## 2022-03-29 MED ORDER — SUGAMMADEX SODIUM 200 MG/2ML IV SOLN
INTRAVENOUS | Status: DC | PRN
  Administered 2022-03-29: 200 mg via INTRAVENOUS

## 2022-03-29 MED ORDER — MIDODRINE HCL 5 MG PO TABS: 5.0000 mg | ORAL_TABLET | Freq: Three times a day (TID) | ORAL | Status: DC

## 2022-03-29 MED ORDER — INSULIN ASPART 100 UNIT/ML IJ SOLN
0.0000 [IU] | INTRAMUSCULAR | Status: DC
  Administered 2022-03-29 – 2022-03-30 (×4): 1 [IU] via SUBCUTANEOUS
  Filled 2022-03-29 (×2): qty 1

## 2022-03-29 MED ORDER — MORPHINE SULFATE (PF) 2 MG/ML IV SOLN: 0.5000 mg | INTRAVENOUS | Status: DC | PRN

## 2022-03-29 MED ORDER — BUPIVACAINE-EPINEPHRINE (PF) 0.25% -1:200000 IJ SOLN
INTRAMUSCULAR | Status: AC
  Filled 2022-03-29: qty 30

## 2022-03-29 MED ORDER — MIDODRINE HCL 5 MG PO TABS
5.0000 mg | ORAL_TABLET | Freq: Three times a day (TID) | ORAL | Status: DC
  Administered 2022-03-29 – 2022-04-02 (×13): 5 mg via ORAL
  Filled 2022-03-29 (×13): qty 1

## 2022-03-29 MED ORDER — SUCCINYLCHOLINE CHLORIDE 200 MG/10ML IV SOSY
PREFILLED_SYRINGE | INTRAVENOUS | Status: DC | PRN
  Administered 2022-03-29: 50 mg via INTRAVENOUS

## 2022-03-29 MED ORDER — HYDRALAZINE HCL 20 MG/ML IJ SOLN: 5.0000 mg | Freq: Four times a day (QID) | INTRAMUSCULAR | Status: DC | PRN

## 2022-03-29 MED ORDER — ACETAMINOPHEN 325 MG PO TABS
325.0000 mg | ORAL_TABLET | Freq: Four times a day (QID) | ORAL | Status: DC | PRN
  Administered 2022-03-30 – 2022-03-31 (×2): 650 mg via ORAL
  Filled 2022-03-29 (×2): qty 2

## 2022-03-29 MED ORDER — FENTANYL CITRATE (PF) 100 MCG/2ML IJ SOLN
INTRAMUSCULAR | Status: AC
  Administered 2022-03-29: 25 ug via INTRAVENOUS
  Filled 2022-03-29: qty 2

## 2022-03-29 MED ORDER — ACETAMINOPHEN 10 MG/ML IV SOLN: 1000.0000 mg | Freq: Once | INTRAVENOUS | Status: DC | PRN

## 2022-03-29 MED ORDER — ASPIRIN 81 MG PO TBEC
81.0000 mg | DELAYED_RELEASE_TABLET | Freq: Every day | ORAL | Status: DC
  Administered 2022-03-30 – 2022-04-01 (×3): 81 mg via ORAL
  Filled 2022-03-29 (×4): qty 1

## 2022-03-29 MED ORDER — ONDANSETRON HCL 4 MG/2ML IJ SOLN: 4.0000 mg | Freq: Once | INTRAMUSCULAR | Status: DC | PRN

## 2022-03-29 MED ORDER — PHENOL 1.4 % MT LIQD: 1.0000 | OROMUCOSAL | Status: DC | PRN

## 2022-03-29 MED ORDER — FERROUS SULFATE 325 (65 FE) MG PO TABS
325.0000 mg | ORAL_TABLET | Freq: Every day | ORAL | Status: DC
  Administered 2022-03-30 – 2022-04-02 (×4): 325 mg via ORAL
  Filled 2022-03-29 (×4): qty 1

## 2022-03-29 MED ORDER — PHENYLEPHRINE HCL-NACL 20-0.9 MG/250ML-% IV SOLN
INTRAVENOUS | Status: DC | PRN
  Administered 2022-03-29: 50 ug/min via INTRAVENOUS

## 2022-03-29 MED ORDER — ENOXAPARIN SODIUM 40 MG/0.4ML IJ SOSY
40.0000 mg | PREFILLED_SYRINGE | INTRAMUSCULAR | Status: DC
  Administered 2022-03-29: 40 mg via SUBCUTANEOUS
  Filled 2022-03-29: qty 0.4

## 2022-03-29 MED ORDER — FENTANYL CITRATE (PF) 100 MCG/2ML IJ SOLN
25.0000 ug | INTRAMUSCULAR | Status: DC | PRN
  Administered 2022-03-29: 25 ug via INTRAVENOUS

## 2022-03-29 MED ORDER — FENTANYL CITRATE (PF) 100 MCG/2ML IJ SOLN
INTRAMUSCULAR | Status: AC
  Filled 2022-03-29: qty 2

## 2022-03-29 MED ORDER — ZOLPIDEM TARTRATE 5 MG PO TABS: 5.0000 mg | ORAL_TABLET | Freq: Every evening | ORAL | Status: DC | PRN

## 2022-03-29 MED ORDER — TRAMADOL HCL 50 MG PO TABS: 50.0000 mg | ORAL_TABLET | Freq: Four times a day (QID) | ORAL | Status: DC | PRN

## 2022-03-29 MED ORDER — ONDANSETRON HCL 4 MG/2ML IJ SOLN
INTRAMUSCULAR | Status: DC | PRN
  Administered 2022-03-29: 4 mg via INTRAVENOUS

## 2022-03-29 MED ORDER — METOCLOPRAMIDE HCL 5 MG/ML IJ SOLN: 5.0000 mg | Freq: Three times a day (TID) | INTRAMUSCULAR | Status: DC | PRN

## 2022-03-29 MED ORDER — CEFAZOLIN SODIUM-DEXTROSE 2-4 GM/100ML-% IV SOLN
INTRAVENOUS | Status: AC
  Filled 2022-03-29: qty 100

## 2022-03-29 MED ORDER — FENTANYL CITRATE (PF) 100 MCG/2ML IJ SOLN
INTRAMUSCULAR | Status: DC | PRN
  Administered 2022-03-29: 50 ug via INTRAVENOUS
  Administered 2022-03-29 (×2): 25 ug via INTRAVENOUS

## 2022-03-29 MED ORDER — FLEET ENEMA 7-19 GM/118ML RE ENEM: 1.0000 | ENEMA | Freq: Once | RECTAL | Status: DC | PRN

## 2022-03-29 MED ORDER — MORPHINE SULFATE (PF) 2 MG/ML IV SOLN
1.0000 mg | INTRAVENOUS | Status: DC | PRN
  Administered 2022-03-29 (×3): 1 mg via INTRAVENOUS
  Filled 2022-03-29 (×2): qty 1

## 2022-03-29 MED ORDER — MENTHOL 3 MG MT LOZG: 1.0000 | LOZENGE | OROMUCOSAL | Status: DC | PRN

## 2022-03-29 MED ORDER — ROCURONIUM BROMIDE 100 MG/10ML IV SOLN
INTRAVENOUS | Status: DC | PRN
  Administered 2022-03-29: 30 mg via INTRAVENOUS

## 2022-03-29 MED ORDER — ROCURONIUM BROMIDE 100 MG/10ML IV SOLN: INTRAVENOUS | Status: DC | PRN

## 2022-03-29 MED ORDER — LIDOCAINE HCL (CARDIAC) PF 100 MG/5ML IV SOSY
PREFILLED_SYRINGE | INTRAVENOUS | Status: DC | PRN
  Administered 2022-03-29: 50 mg via INTRAVENOUS

## 2022-03-29 MED ORDER — CEFAZOLIN SODIUM-DEXTROSE 2-4 GM/100ML-% IV SOLN
2.0000 g | Freq: Three times a day (TID) | INTRAVENOUS | Status: AC
  Administered 2022-03-29 – 2022-03-30 (×3): 2 g via INTRAVENOUS
  Filled 2022-03-29 (×3): qty 100

## 2022-03-29 MED ORDER — ACETAMINOPHEN 10 MG/ML IV SOLN
INTRAVENOUS | Status: AC
  Filled 2022-03-29: qty 100

## 2022-03-29 MED ORDER — METOCLOPRAMIDE HCL 5 MG PO TABS: 5.0000 mg | ORAL_TABLET | Freq: Three times a day (TID) | ORAL | Status: DC | PRN

## 2022-03-29 MED ORDER — GABAPENTIN 400 MG PO CAPS
400.0000 mg | ORAL_CAPSULE | ORAL | Status: AC
  Filled 2022-03-29: qty 1

## 2022-03-29 MED ORDER — POTASSIUM CHLORIDE IN NACL 20-0.9 MEQ/L-% IV SOLN
INTRAVENOUS | Status: DC
  Filled 2022-03-29: qty 1000

## 2022-03-29 SURGICAL SUPPLY — 32 items
APL PRP STRL LF DISP 70% ISPRP (MISCELLANEOUS) ×1
BIT DRILL CANN LRG QC 5X300 (BIT) IMPLANT
BLADE SURG SZ11 CARB STEEL (BLADE) ×1 IMPLANT
BNDG CMPR 5X4 CHSV STRCH STRL (GAUZE/BANDAGES/DRESSINGS) ×1
BNDG COHESIVE 4X5 TAN STRL LF (GAUZE/BANDAGES/DRESSINGS) ×1 IMPLANT
CHLORAPREP W/TINT 26 (MISCELLANEOUS) ×1 IMPLANT
DRSG AQUACEL AG ADV 3.5X10 (GAUZE/BANDAGES/DRESSINGS) ×1 IMPLANT
DRSG OPSITE POSTOP 4X8 (GAUZE/BANDAGES/DRESSINGS) IMPLANT
GAUZE SPONGE 4X4 12PLY STRL (GAUZE/BANDAGES/DRESSINGS) ×1 IMPLANT
GLOVE BIO SURGEON STRL SZ8.5 (GLOVE) ×1 IMPLANT
GOWN STRL REUS W/ TWL LRG LVL3 (GOWN DISPOSABLE) ×1 IMPLANT
GOWN STRL REUS W/TWL LRG LVL3 (GOWN DISPOSABLE) ×1
GOWN STRL REUS W/TWL LRG LVL4 (GOWN DISPOSABLE) ×1 IMPLANT
GUIDEWIRE THREADED 2.8 (WIRE) IMPLANT
KIT TURNOVER KIT A (KITS) ×1 IMPLANT
MAT ABSORB  FLUID 56X50 GRAY (MISCELLANEOUS) ×1
MAT ABSORB FLUID 56X50 GRAY (MISCELLANEOUS) ×1 IMPLANT
NDL SPNL 18GX3.5 QUINCKE PK (NEEDLE) ×1 IMPLANT
NDL SPNL 20GX3.5 QUINCKE YW (NEEDLE) IMPLANT
NEEDLE SPNL 18GX3.5 QUINCKE PK (NEEDLE) IMPLANT
NEEDLE SPNL 20GX3.5 QUINCKE YW (NEEDLE) ×1 IMPLANT
NS IRRIG 500ML POUR BTL (IV SOLUTION) ×1 IMPLANT
PACK HIP COMPR (MISCELLANEOUS) ×1 IMPLANT
SCREW 6.5MM CANN 32X80 (Screw) IMPLANT
SCREW 6.5MM CANN 32X85 (Screw) IMPLANT
SCREW CANN 6.5X90 (Screw) ×2 IMPLANT
SCREW CANN 6.5X90 32MM THD (Screw) IMPLANT
SOL PREP PVP 2OZ (MISCELLANEOUS) ×1
SOLUTION PREP PVP 2OZ (MISCELLANEOUS) ×1 IMPLANT
SUT ETHILON 3 0 FSLX (SUTURE) ×1 IMPLANT
SYR 30ML LL (SYRINGE) ×1 IMPLANT
WATER STERILE IRR 500ML POUR (IV SOLUTION) ×1 IMPLANT

## 2022-03-29 NOTE — H&P (Incomplete)
History and Physical    Patient: Diana Frost EKC:[id number] DOB: 11-16-1945 DOA: 03/28/2022 DOS: the patient was seen and examined on 03/29/2022 PCP: Latanya Maudlin, NP  Patient coming from: {Point_of_Origin:[address]}  Chief Complaint:  Chief Complaint  Patient presents with   Hip Pain   HPI: Diana Frost is a 76 y.o. female with medical history significant of ***  Review of Systems: {ROS_Text:26778} Past Medical History:  Diagnosis Date   Coronary artery disease    Diabetes mellitus without complication ([place])    insulin pump   Headache    since ear problem started   Hypertension    Myocardial infarction ([place]) 11/01/13   Past Surgical History:  Procedure Laterality Date   ABDOMINAL HYSTERECTOMY     BACK SURGERY     tail bone removed after fall/fracture   CATARACT EXTRACTION BILATERAL W/ ANTERIOR VITRECTOMY     CORONARY ANGIOPLASTY WITH STENT PLACEMENT  11/01/2013   [facility], stent x2   EYE SURGERY     HIP PINNING,CANNULATED Right 01/07/2019   Procedure: CANNULATED HIP PINNING;  Surgeon: Corky Mull, MD;  Location: [facility];  Service: Orthopedics;  Laterality: Right;   LEFT HEART CATH AND CORONARY ANGIOGRAPHY N/A 08/14/2016   Procedure: Left Heart Cath and Coronary Angiography;  Surgeon: Minna Merritts, MD;  Location: [facility];  Service: Cardiovascular;  Laterality: N/A;   MELANOMA EXCISION Right    arm with skin graft   MYRINGOTOMY WITH TUBE PLACEMENT Left 08/21/2015   Procedure: MYRINGOTOMY WITH TUBE PLACEMENT;  Surgeon: Carloyn Manner, MD;  Location: [facility];  Service: ENT;  Laterality: Left;   NASOPHARYNGEAL BIOPSY N/A 11/07/2015   Procedure: NASOPHARYNGEAL BIOPSY;  Surgeon: Carloyn Manner, MD;  Location: [facility];  Service: ENT;  Laterality: N/A;   TONSILLECTOMY     Social History:  reports that she quit smoking about 23 years ago. Her smoking use included cigarettes. She has never used smokeless tobacco. She reports  current alcohol use of about 5.0 standard drinks of alcohol per week. She reports that she does not use drugs.  Allergies  Allergen Reactions   Bupropion     Other reaction(s): Other (See Comments) Constipation    Percocet [Oxycodone-Acetaminophen] Nausea And Vomiting   Statins     Joint pain   Sulfa Antibiotics Nausea And Vomiting    Family History  Problem Relation Age of Onset   Lung cancer Father    Breast cancer Maternal Aunt     Prior to Admission medications   Medication Sig Start Date End Date Taking? Authorizing Provider  acetaminophen (TYLENOL) 325 MG tablet Take 2 tablets (650 mg total) by mouth every 6 (six) hours as needed for mild pain, fever or headache (pain score 1-3 or temp > 100.5). Patient not taking: Reported on 10/11/2021 01/10/19   Gladstone Lighter, MD  alendronate (FOSAMAX) 70 MG tablet Take 70 mg by mouth once a week. 11/23/20   [provider]  aspirin 81 MG tablet Take 81 mg by mouth daily.    [provider]  docusate sodium (COLACE) 100 MG capsule Take 1 capsule (100 mg total) by mouth 2 (two) times daily. 01/10/19   Gladstone Lighter, MD  DULoxetine (CYMBALTA) 20 MG capsule Take 40 mg by mouth as directed. Take 1 capsule (20 mg total) by mouth once daily for 14 days, THEN 2 capsules (40 mg total) once daily for 14 days.1-2 09/25/21 01/04/22  [provider]  DULoxetine (CYMBALTA) 60 MG capsule  Take 60 mg by mouth daily. 09/25/21   [provider]  Glucagon, rDNA, (GLUCAGON EMERGENCY IJ) Inject as directed as needed (for low blood sugar).    [provider]  insulin lispro (HUMALOG) 100 UNIT/ML injection Inject 0-23 Units into the skin daily. VIA PUMP 09/29/18   [provider]  midodrine (PROAMATINE) 5 MG tablet Take 5 mg by mouth 3 (three) times daily. 12/15/21   [provider]  ondansetron (ZOFRAN-ODT) 4 MG disintegrating tablet Take 1 tablet (4 mg total) by mouth every 8 (eight) hours as needed  for nausea or vomiting. 01/04/22   Laurene Footman B, PA-C  rosuvastatin (CRESTOR) 10 MG tablet Take 10 mg by mouth at bedtime. 12/05/18   [provider]    Physical Exam: Vitals:   03/28/22 1858 03/28/22 1859  BP: (!) 151/80   Pulse: 88   Resp: 16   Temp: 98 F (36.7 C)   TempSrc: Oral   SpO2: 98%   Weight:  53.1 kg  Height:  '5\' 2"'$  (1.575 m)   *** Data Reviewed: {Tip this will not be part of the note when signed- Document your independent interpretation of telemetry tracing, EKG, lab, Radiology test or any other diagnostic tests. Add any new diagnostic test ordered today. (Optional):26781} {Results:26384}  Assessment and Plan: No notes have been filed under this hospital service. Service: Hospitalist     Advance Care Planning:   Code Status: Prior ***  Consults: ***  Family Communication: ***  Severity of Illness: {Observation/Inpatient:21159}  Author: Jani Gravel, MD 03/29/2022 12:03 AM  For on call review [email or web address].

## 2022-03-29 NOTE — Consult Note (Signed)
ORTHOPAEDIC CONSULTATION  REQUESTING PHYSICIAN: Enzo Bi, MD   Chief Complaint: Left hip pain  HPI: Diana Frost is a 76 y.o. female who complains of left hip pain after a fall at home last night.  The patient was brought to the emergency room where exam and x-rays revealed a nondisplaced minimally impacted subcapital fracture of the left hip.  Patient had a similar injury on the right side 3 years ago and had successful surgery.  I have discussed the diagnosis with the patient and her daughter and I recommended percutaneous pinning of the fracture.  They are in agreement with this.  The risks and benefits of surgery and postop protocol were discussed with them and we will proceed this morning.  Past Medical History:  Diagnosis Date   Coronary artery disease    Diabetes mellitus without complication ([place])    insulin pump   Headache    since ear problem started   Hypertension    Myocardial infarction ([place]) 11/01/13   Past Surgical History:  Procedure Laterality Date   ABDOMINAL HYSTERECTOMY     BACK SURGERY     tail bone removed after fall/fracture   CATARACT EXTRACTION BILATERAL W/ ANTERIOR VITRECTOMY     CORONARY ANGIOPLASTY WITH STENT PLACEMENT  11/01/2013   [facility], stent x2   EYE SURGERY     HIP PINNING,CANNULATED Right 01/07/2019   Procedure: CANNULATED HIP PINNING;  Surgeon: Corky Mull, MD;  Location: [facility];  Service: Orthopedics;  Laterality: Right;   LEFT HEART CATH AND CORONARY ANGIOGRAPHY N/A 08/14/2016   Procedure: Left Heart Cath and Coronary Angiography;  Surgeon: Minna Merritts, MD;  Location: [facility];  Service: Cardiovascular;  Laterality: N/A;   MELANOMA EXCISION Right    arm with skin graft   MYRINGOTOMY WITH TUBE PLACEMENT Left 08/21/2015   Procedure: MYRINGOTOMY WITH TUBE PLACEMENT;  Surgeon: Carloyn Manner, MD;  Location: [facility];  Service: ENT;  Laterality: Left;   NASOPHARYNGEAL BIOPSY N/A 11/07/2015    Procedure: NASOPHARYNGEAL BIOPSY;  Surgeon: Carloyn Manner, MD;  Location: [facility];  Service: ENT;  Laterality: N/A;   TONSILLECTOMY     Social History   Socioeconomic History   Marital status: Married    Spouse name: Not on file   Number of children: Not on file   Years of education: Not on file   Highest education level: Not on file  Occupational History   Not on file  Tobacco Use   Smoking status: Former    Types: Cigarettes    Quit date: 04/30/1998    Years since quitting: 23.9   Smokeless tobacco: Never  Vaping Use   Vaping Use: Never used  Substance and Sexual Activity   Alcohol use: Yes    Alcohol/week: 5.0 standard drinks of alcohol    Types: 5 Glasses of wine per week    Comment:     Drug use: No   Sexual activity: Not on file  Other Topics Concern   Not on file  Social History Narrative   Not on file   Social Determinants of Health   Financial Resource Strain: Not on file  Food Insecurity: Not on file  Transportation Needs: Not on file  Physical Activity: Not on file  Stress: Not on file  Social Connections: Not on file   Family History  Problem Relation Age of Onset   Lung cancer Father    Breast cancer Maternal Aunt    Allergies  Allergen Reactions  Bupropion     Other reaction(s): Other (See Comments) Constipation    Percocet [Oxycodone-Acetaminophen] Nausea And Vomiting   Statins     Joint pain   Sulfa Antibiotics Nausea And Vomiting   Prior to Admission medications   Medication Sig Start Date End Date Taking? Authorizing Provider  alendronate (FOSAMAX) 70 MG tablet Take 70 mg by mouth once a week. 11/23/20  Yes [provider]  aspirin 81 MG tablet Take 81 mg by mouth daily.   Yes [provider]  buPROPion (WELLBUTRIN XL) 150 MG 24 hr tablet Take 150 mg by mouth every morning. 12/11/21 12/11/22 Yes [provider]  docusate sodium (COLACE) 100 MG capsule Take 1 capsule (100 mg total) by mouth 2 (two) times daily.  01/10/19  Yes Gladstone Lighter, MD  Glucagon, rDNA, (GLUCAGON EMERGENCY IJ) Inject as directed as needed (for low blood sugar).   Yes [provider]  insulin lispro (HUMALOG) 100 UNIT/ML injection Inject 0-23 Units into the skin daily. VIA PUMP 09/29/18  Yes [provider]  midodrine (PROAMATINE) 5 MG tablet Take 5 mg by mouth 3 (three) times daily. 12/15/21  Yes [provider]  rosuvastatin (CRESTOR) 10 MG tablet Take 10 mg by mouth at bedtime. 12/05/18  Yes [provider]  sertraline (ZOLOFT) 100 MG tablet Take 100 mg by mouth daily. 01/15/22  Yes [provider]  zinc gluconate 50 MG tablet Take 50 mg by mouth daily.   Yes [provider]  acetaminophen (TYLENOL) 325 MG tablet Take 2 tablets (650 mg total) by mouth every 6 (six) hours as needed for mild pain, fever or headache (pain score 1-3 or temp > 100.5). Patient not taking: Reported on 10/11/2021 01/10/19   Gladstone Lighter, MD  DULoxetine (CYMBALTA) 20 MG capsule Take 40 mg by mouth as directed. Take 1 capsule (20 mg total) by mouth once daily for 14 days, THEN 2 capsules (40 mg total) once daily for 14 days.1-2 09/25/21 01/04/22  [provider]  DULoxetine (CYMBALTA) 60 MG capsule Take 60 mg by mouth daily. Patient not taking: Reported on 03/29/2022 09/25/21   [provider]  LANTUS SOLOSTAR 100 UNIT/ML Solostar Pen Inject 7 Units into the skin at bedtime. Inject 0.07 mL (7 Units total) under the skin nightly. Discard pen 28 days after first use. Patient not taking: Reported on 03/29/2022 01/07/22   [provider]  ondansetron (ZOFRAN-ODT) 4 MG disintegrating tablet Take 1 tablet (4 mg total) by mouth every 8 (eight) hours as needed for nausea or vomiting. Patient not taking: Reported on 03/29/2022 01/04/22   Gretta Cool   DG Chest 1 View  Result Date: 03/28/2022 CLINICAL DATA:  Fall, left hip fracture EXAM: CHEST  1 VIEW COMPARISON:  None  Available. FINDINGS: Lungs are well expanded, symmetric, and clear. No pneumothorax or pleural effusion. Cardiac size within normal limits. Coronary artery stenting noted. Pulmonary vascularity is normal. Osseous structures are age-appropriate. No acute bone abnormality. IMPRESSION: No active disease. Electronically Signed   By: Fidela Salisbury M.D.   On: 03/28/2022 19:49   DG Knee Complete 4 Views Left  Result Date: 03/28/2022 CLINICAL DATA:  Fall, left knee pain EXAM: LEFT KNEE - COMPLETE 4+ VIEW COMPARISON:  None Available. FINDINGS: Normal alignment. No acute fracture or dislocation. Joint spaces are not optimally profiled but appear preserved. No effusion. Vascular calcifications are noted. IMPRESSION: 1. No acute fracture or dislocation. Electronically Signed   By: Linwood Dibbles.D.  On: 03/28/2022 19:49   DG Hip Unilat W or Wo Pelvis 2-3 Views Left  Result Date: 03/28/2022 CLINICAL DATA:  Fall, pelvic pain, left hip pain EXAM: DG HIP (WITH OR WITHOUT PELVIS) 2-3V LEFT COMPARISON:  None Available. FINDINGS: There is an acute, mildly impacted, anatomically aligned subcapital left femoral neck fracture. Femoral head is still seated within the left acetabulum and left hip joint space appears preserved. Mild superimposed left hip degenerative arthritis. Right hip ORIF has been performed with 3 partially threaded screws. Mild right hip degenerative arthritis. Pelvis appears intact. Vascular calcifications noted. IMPRESSION: 1. Acute, mildly impacted, anatomically aligned subcapital left femoral neck fracture. Electronically Signed   By: Fidela Salisbury M.D.   On: 03/28/2022 19:48    Positive ROS: All other systems have been reviewed and were otherwise negative with the exception of those mentioned in the HPI and as above.  Physical Exam: General: Alert, no acute distress Cardiovascular: No pedal edema Respiratory: No cyanosis, no use of accessory musculature GI: No organomegaly, abdomen is soft  and non-tender Skin: No lesions in the area of chief complaint Neurologic: Sensation intact distally Psychiatric: Patient is competent for consent with normal mood and affect Lymphatic: No axillary or cervical lymphadenopathy  MUSCULOSKELETAL: There is pain with any movement of the left leg.  The skin is intact.  Neurovascular status to be distally.  There is no shortening or significant rotation.  Right leg and upper extremities are normal.  The spine is nontender.  Assessment: Nondisplaced subcapital fracture left hip   Plan: Percutaneous pinning left hip    Park Breed, MD [phone]   03/29/2022 10:29 AM

## 2022-03-29 NOTE — Op Note (Signed)
03/29/2022  12:00 PM  PATIENT:  Diana Frost    PRE-OPERATIVE DIAGNOSIS:  Left hip fracture nondisplaced subcapital  POST-OPERATIVE DIAGNOSIS:  Same  PROCEDURE:  PERCUTANEOUS FIXATION OF FEMORAL NECK fracture  SURGEON:  Park Breed, MD     ANESTHESIA:   General endotracheal  PREOPERATIVE INDICATIONS:  Diana Frost is a  76 y.o. female who fell and was found to have a diagnosis of Left hip fracture who elected for surgical management.    The risks benefits and alternatives were discussed with the patient preoperatively including but not limited to the risks of infection, bleeding, nerve injury, cardiopulmonary complications, blood clots, malunion, nonunion, avascular necrosis, the need for revision surgery, the potential for conversion to hemiarthroplasty, among others, and the patient was willing to proceed.  OPERATIVE IMPLANTS: 6.5 mm cannulated screws x4  OPERATIVE FINDINGS: Clinical osteoporosis with weak bone, proximal femur  OPERATIVE PROCEDURE: The patient was brought to the operating room and placed in supine position. IV antibiotics were given. General anesthesia administered.  The patient was placed on the fracture table. The operative extremity was positioned, without any significant reduction maneuver and was prepped and draped in usual sterile fashion.  Time out was performed.  Small incisions were made distal to the greater trochanter, and 4 guidewires were introduced into the head and neck. The lengths were measured. The reduction was slightly valgus, and near-anatomic. I opened the cortex with a cannulated drill, and then placed the screws into position. Satisfactory fixation was achieved.  The wounds were irrigated copiously, and repaired with  3-O Nylon with and sterile gauze. Sponge and needle count were correct.  There no complications and the patient tolerated the procedure well.  The patient will be touch down weightbearing as tolerated, and will be  on Lovenox  for DVT prophylaxis.   Park Breed, MD

## 2022-03-29 NOTE — Transfer of Care (Signed)
Immediate Anesthesia Transfer of Care Note  Patient: Diana Frost  Procedure(s) Performed: PERCUTANEOUS FIXATION OF FEMORAL NECK (Left: Hip)  Patient Location: PACU  Anesthesia Type:General  Level of Consciousness: awake, alert , and oriented  Airway & Oxygen Therapy: Patient Spontanous Breathing and Patient connected to face mask oxygen  Post-op Assessment: Report given to RN and Post -op Vital signs reviewed and stable  Post vital signs: Reviewed and stable  Last Vitals:  Vitals Value Taken Time  BP 123/65 03/29/22 1201  Temp    Pulse 94 03/29/22 1203  Resp 14 03/29/22 1203  SpO2 100 % 03/29/22 1203  Vitals shown include unvalidated device data.  Last Pain:  Vitals:   03/29/22 0930  TempSrc: Oral  PainSc:          Complications: No notable events documented.

## 2022-03-29 NOTE — ED Notes (Signed)
Pt states she has a working insulin pump.

## 2022-03-29 NOTE — ED Notes (Signed)
Assumed care from. Pt requesting pain medication. Call light with in reach.

## 2022-03-29 NOTE — H&P (Signed)
THE PATIENT WAS SEEN PRIOR TO SURGERY TODAY.  HISTORY, ALLERGIES, HOME MEDICATIONS AND OPERATIVE PROCEDURE WERE REVIEWED. RISKS AND BENEFITS OF SURGERY DISCUSSED WITH PATIENT AGAIN.  NO CHANGES FROM INITIAL HISTORY AND PHYSICAL NOTED.    

## 2022-03-29 NOTE — ED Notes (Signed)
Ancef placed at bedside for OR per request.

## 2022-03-29 NOTE — Anesthesia Procedure Notes (Signed)
Procedure Name: Intubation Date/Time: 03/29/2022 10:47 AM  Performed by: Lily Peer, Azharia Surratt, CRNAPre-anesthesia Checklist: Patient identified, Emergency Drugs available, Suction available and Patient being monitored Patient Re-evaluated:Patient Re-evaluated prior to induction Oxygen Delivery Method: Circle system utilized Preoxygenation: Pre-oxygenation with 100% oxygen Induction Type: IV induction Ventilation: Mask ventilation without difficulty Laryngoscope Size: McGraph and 3 Grade View: Grade I Tube type: Oral Tube size: 6.5 mm Number of attempts: 1 Airway Equipment and Method: Stylet Placement Confirmation: ETT inserted through vocal cords under direct vision, positive ETCO2 and breath sounds checked- equal and bilateral Secured at: 19 cm Tube secured with: Tape Dental Injury: Teeth and Oropharynx as per pre-operative assessment

## 2022-03-29 NOTE — Anesthesia Preprocedure Evaluation (Addendum)
Anesthesia Evaluation  Patient identified by MRN, date of birth, ID band Patient awake    Reviewed: Allergy & Precautions, NPO status , Patient's Chart, lab work & pertinent test results  History of Anesthesia Complications Negative for: history of anesthetic complications  Airway Mallampati: IV   Neck ROM: Full    Dental  (+) Missing   Pulmonary former smoker (quit 2000)   Pulmonary exam normal breath sounds clear to auscultation       Cardiovascular hypertension, + CAD (s/p MI and stents)  Normal cardiovascular exam Rhythm:Regular Rate:Normal  ECG 01/15/22:  NORMAL SINUS RHYTHM  LOW VOLTAGE QRS    Neuro/Psych  Headaches    GI/Hepatic negative GI ROS,,,  Endo/Other  diabetes, Type 1, Insulin Dependent    Renal/GU Renal disease     Musculoskeletal   Abdominal   Peds  Hematology   Anesthesia Other Findings   Reproductive/Obstetrics                             Anesthesia Physical Anesthesia Plan  ASA: 3  Anesthesia Plan: General   Post-op Pain Management:    Induction: Intravenous  PONV Risk Score and Plan: 3 and Ondansetron, Dexamethasone and Treatment may vary due to age or medical condition  Airway Management Planned: Oral ETT  Additional Equipment:   Intra-op Plan:   Post-operative Plan: Extubation in OR  Informed Consent: I have reviewed the patients History and Physical, chart, labs and discussed the procedure including the risks, benefits and alternatives for the proposed anesthesia with the patient or authorized representative who has indicated his/her understanding and acceptance.     Dental advisory given  Plan Discussed with: CRNA  Anesthesia Plan Comments: (Patient consented for risks of anesthesia including but not limited to:  - adverse reactions to medications - damage to eyes, teeth, lips or other oral mucosa - nerve damage due to positioning  - sore  throat or hoarseness - damage to heart, brain, nerves, lungs, other parts of body or loss of life  Informed patient about role of CRNA in peri- and intra-operative care.  Patient voiced understanding.)        Anesthesia Quick Evaluation

## 2022-03-29 NOTE — Anesthesia Postprocedure Evaluation (Signed)
Anesthesia Post Note  Patient: Diana Frost  Procedure(s) Performed: PERCUTANEOUS FIXATION OF FEMORAL NECK (Left: Hip)  Patient location during evaluation: PACU Anesthesia Type: General Level of consciousness: awake and alert, oriented and patient cooperative Pain management: pain level controlled Vital Signs Assessment: post-procedure vital signs reviewed and stable Respiratory status: spontaneous breathing, nonlabored ventilation and respiratory function stable Cardiovascular status: blood pressure returned to baseline and stable Postop Assessment: adequate PO intake Anesthetic complications: no   No notable events documented.   Last Vitals:  Vitals:   03/29/22 1230 03/29/22 1245  BP: (!) 96/55 (!) 102/50  Pulse: 95 93  Resp: 11 11  Temp:  (!) 36.3 C  SpO2: 93% 100%    Last Pain:  Vitals:   03/29/22 1205  TempSrc:   PainSc: Asleep                 Darrin Nipper

## 2022-03-30 DIAGNOSIS — S72002A Fracture of unspecified part of neck of left femur, initial encounter for closed fracture: Secondary | ICD-10-CM | POA: Diagnosis not present

## 2022-03-30 LAB — BASIC METABOLIC PANEL
Anion gap: 8 (ref 5–15)
BUN: 14 mg/dL (ref 8–23)
CO2: 25 mmol/L (ref 22–32)
Calcium: 8.2 mg/dL — ABNORMAL LOW (ref 8.9–10.3)
Chloride: 107 mmol/L (ref 98–111)
Creatinine, Ser: 0.73 mg/dL (ref 0.44–1.00)
GFR, Estimated: >60 mL/min (ref >60)
Glucose, Bld: 124 mg/dL — ABNORMAL HIGH (ref 70–99)
Potassium: 3.7 mmol/L (ref 3.5–5.1)
Sodium: 140 mmol/L (ref 135–145)

## 2022-03-30 LAB — GLUCOSE, CAPILLARY
Glucose-Capillary: 118 mg/dL — ABNORMAL HIGH (ref 70–99)
Glucose-Capillary: 124 mg/dL — ABNORMAL HIGH (ref 70–99)
Glucose-Capillary: 142 mg/dL — ABNORMAL HIGH (ref 70–99)
Glucose-Capillary: 159 mg/dL — ABNORMAL HIGH (ref 70–99)
Glucose-Capillary: 214 mg/dL — ABNORMAL HIGH (ref 70–99)
Glucose-Capillary: 250 mg/dL — ABNORMAL HIGH (ref 70–99)

## 2022-03-30 LAB — CBC
HCT: 30.9 % — ABNORMAL LOW (ref 36.0–46.0)
Hemoglobin: 9.8 g/dL — ABNORMAL LOW (ref 12.0–15.0)
MCH: 30.9 pg (ref 26.0–34.0)
MCHC: 31.7 g/dL (ref 30.0–36.0)
MCV: 97.5 fL (ref 80.0–100.0)
Platelets: 167 10*3/uL (ref 150–400)
RBC: 3.17 MIL/uL — ABNORMAL LOW (ref 3.87–5.11)
RDW: 14 % (ref 11.5–15.5)
WBC: 7.8 10*3/uL (ref 4.0–10.5)
nRBC: 0 % (ref 0.0–0.2)

## 2022-03-30 LAB — MAGNESIUM: Magnesium: 1.8 mg/dL (ref 1.7–2.4)

## 2022-03-30 MED ORDER — BUPROPION HCL ER (XL) 150 MG PO TB24
150.0000 mg | ORAL_TABLET | Freq: Every morning | ORAL | Status: DC
  Administered 2022-03-30 – 2022-04-01 (×3): 150 mg via ORAL
  Filled 2022-03-30 (×4): qty 1

## 2022-03-30 MED ORDER — INSULIN GLARGINE-YFGN 100 UNIT/ML ~~LOC~~ SOLN
5.0000 [IU] | Freq: Every day | SUBCUTANEOUS | Status: DC
  Administered 2022-03-30: 5 [IU] via SUBCUTANEOUS
  Filled 2022-03-30: qty 0.05

## 2022-03-30 MED ORDER — ENOXAPARIN SODIUM 40 MG/0.4ML IJ SOSY
40.0000 mg | PREFILLED_SYRINGE | INTRAMUSCULAR | Status: DC
  Administered 2022-03-31 – 2022-04-02 (×3): 40 mg via SUBCUTANEOUS
  Filled 2022-03-30 (×3): qty 0.4

## 2022-03-30 MED ORDER — INSULIN ASPART 100 UNIT/ML IJ SOLN: 0.0000 [IU] | Freq: Three times a day (TID) | INTRAMUSCULAR | Status: DC

## 2022-03-30 MED ORDER — ONDANSETRON 4 MG PO TBDP
4.0000 mg | ORAL_TABLET | Freq: Three times a day (TID) | ORAL | Status: DC | PRN
  Administered 2022-04-02: 4 mg via ORAL
  Filled 2022-03-30 (×3): qty 1

## 2022-03-30 MED ORDER — INSULIN ASPART 100 UNIT/ML IJ SOLN
0.0000 [IU] | Freq: Three times a day (TID) | INTRAMUSCULAR | Status: DC
  Administered 2022-03-30: 2 [IU] via SUBCUTANEOUS
  Administered 2022-03-30: 3 [IU] via SUBCUTANEOUS
  Administered 2022-03-31: 7 [IU] via SUBCUTANEOUS
  Administered 2022-03-31 – 2022-04-01 (×2): 2 [IU] via SUBCUTANEOUS
  Administered 2022-04-01: 3 [IU] via SUBCUTANEOUS
  Filled 2022-03-30 (×6): qty 1

## 2022-03-30 MED ORDER — INSULIN GLARGINE-YFGN 100 UNIT/ML ~~LOC~~ SOLN
5.0000 [IU] | Freq: Every day | SUBCUTANEOUS | Status: DC
  Filled 2022-03-30: qty 0.05

## 2022-03-30 MED ORDER — SERTRALINE HCL 50 MG PO TABS
100.0000 mg | ORAL_TABLET | Freq: Every day | ORAL | Status: DC
  Administered 2022-03-30 – 2022-04-02 (×4): 100 mg via ORAL
  Filled 2022-03-30 (×4): qty 2

## 2022-03-30 NOTE — Progress Notes (Signed)
  PROGRESS NOTE    Diana Frost  HQP:[id number] DOB: [birth year] DOA: 03/28/2022 PCP: Latanya Maudlin, NP  147A/147A-AA  LOS: 2 days   Brief hospital course:   Assessment & Plan: Diana Frost is a 76 y.o. female with medical history significant of hypertension, Dm2, CAD, Osteoporosis, presents with mechanical fall while in the kitchen, on wood floor.  Pt landed on her left hip, and c/o pain and called EMS.  Pt states that her legs got tangled.    L femoral neck fracture S/p PERCUTANEOUS FIXATION OF left FEMORAL NECK on 03/29/22 --PT --pain control   Hypokalemia  --monitor and replete PRN   Hx of Hypotension Cont midodrine '5mg'$  po tid   Hyperlipidemia, CAD Cont aspirin '81mg'$  po qday Cont Crestor '10mg'$  po qday   Dm2 --insulin pump removed around noon today --start glargine 5u nightly --ACHS and SSI TID   DVT prophylaxis: Lovenox SQ Code Status: Full code  Family Communication:  Level of care: Telemetry Medical Dispo:   The patient is from: home Anticipated d/c is to: SNF rehab Anticipated d/c date is: whenever bed available   Subjective and Interval History:  Pt reported pain over her left lateral thigh.  Insulin pump removed around noon, and pt put on subQ insulin.   Objective: Vitals:   03/30/22 0010 03/30/22 0500 03/30/22 0736 03/30/22 1439  BP: 120/63 113/65 108/60 (!) 97/51  Pulse: 88 (!) 106 95 91  Resp: 17 17    Temp: 98 F (36.7 C) 98.9 F (37.2 C) 98.5 F (36.9 C) 98.4 F (36.9 C)  TempSrc:      SpO2: 100% 95% 91% 96%  Weight:      Height:        Intake/Output Summary (Last 24 hours) at 03/30/2022 1906 Last data filed at 03/30/2022 1704 Gross per 24 hour  Intake --  Output 1300 ml  Net -1300 ml   Filed Weights   03/28/22 1859  Weight: 53.1 kg    Examination:   Constitutional: NAD, AAOx3 HEENT: conjunctivae and lids normal, EOMI CV: No cyanosis.   RESP: normal respiratory effort, on RA SKIN: warm, dry Neuro: II - XII  grossly intact.   Psych: Normal mood and affect.  Appropriate judgement and reason   Data Reviewed: I have personally reviewed labs and imaging studies  Time spent: 35 minutes  Enzo Bi, MD [facility] If 7PM-7AM, please contact night-coverage 03/30/2022, 7:06 PM

## 2022-03-30 NOTE — Evaluation (Signed)
Physical Therapy Evaluation Patient Details Name: Diana Frost MRN: [id number] DOB: 1945-04-14 Today's Date: 03/30/2022  History of Present Illness  Diana Frost is a 76 y.o. female with medical history significant of hypertension, Dm2, CAD, Osteoporosis, presents with mechanical fall while in the kitchen, on wood floor.  Pt landed on her left hip, and c/o pain and called EMS.  Pt states that her legs got tangled. Patient is s/p L femur ORIF, TTWB  Clinical Impression  Patient received in bed, she is agreeable to PT assessment. Daughter initially in room, but left prior to mobility. Patient requires mod/max A for bed mobility. Mod A for sit to stand and max A for stand pivot to recliner. She will continue to benefit from skilled PT to improve functional independence and safety with mobility.        Recommendations for follow up therapy are one component of a multi-disciplinary discharge planning process, led by the attending physician.  Recommendations may be updated based on patient status, additional functional criteria and insurance authorization.  Follow Up Recommendations Skilled nursing-short term rehab (<3 hours/day) Can patient physically be transported by private vehicle: No    Assistance Recommended at Discharge Frequent or constant Supervision/Assistance  Patient can return home with the following  A lot of help with walking and/or transfers;A lot of help with bathing/dressing/bathroom    Equipment Recommendations None recommended by PT  Recommendations for Other Services       Functional Status Assessment Patient has had a recent decline in their functional status and demonstrates the ability to make significant improvements in function in a reasonable and predictable amount of time.     Precautions / Restrictions Precautions Precautions: Fall Restrictions Weight Bearing Restrictions: Yes LLE Weight Bearing: Touchdown weight bearing      Mobility  Bed  Mobility Overal bed mobility: Needs Assistance Bed Mobility: Supine to Sit     Supine to sit: Mod assist     General bed mobility comments: mod assist to elevate trunk, scoot out to edge of bed, manage L LE    Transfers Overall transfer level: Needs assistance Equipment used: Rolling walker (2 wheels) Transfers: Sit to/from Stand, Bed to chair/wheelchair/BSC Sit to Stand: Mod assist Stand pivot transfers: Max assist         General transfer comment: patient initially stood with RW, became nauseated and light-headed was unable to pivot at all in standing. Sat back down and writer performed SPT with Max assist, no AD.    Ambulation/Gait                  Stairs            Wheelchair Mobility    Modified Rankin (Stroke Patients Only)       Balance Overall balance assessment: Needs assistance Sitting-balance support: Feet supported Sitting balance-Leahy Scale: Good Sitting balance - Comments: leaning to right due to pain Postural control: Right lateral lean Standing balance support: Bilateral upper extremity supported, During functional activity, Reliant on assistive device for balance Standing balance-Leahy Scale: Fair Standing balance comment: able to static stand with min guard.                             Pertinent Vitals/Pain Pain Assessment Pain Assessment: Faces Faces Pain Scale: Hurts even more Pain Location: L LE Pain Descriptors / Indicators: Discomfort, Grimacing, Guarding Pain Intervention(s): Monitored during session, Repositioned, Ice applied    Home Living Family/patient  expects to be discharged to:: Skilled nursing facility Living Arrangements: Spouse/significant other Available Help at Discharge: Family;Available PRN/intermittently (patient lives with husband who has had CVA. Daughter assists as able.) Type of Home: House Home Access: Ramped entrance       Home Layout: Two level;Able to live on main level with  bedroom/bathroom Home Equipment: Rolling Walker (2 wheels);Rollator (4 wheels);Cane - single point      Prior Function Prior Level of Function : Needs assist;History of Falls (last six months)             Mobility Comments: Pt reports she's not allowed to drive. Pt reports she primarily uses a rollator in the home, RW outside of the home, recently started receiving HHPT services. Pt lives with her husband who she cares for (he requires hoyer lift to transfer OOB to w/c, he also has a personal care aide himself). Pt endorses 4-5 falls in the past 6 months with last one occurring the morning she presented to the hospital. ADLs Comments: Independent with basic ADLS     Hand Dominance   Dominant Hand: Right    Extremity/Trunk Assessment   Upper Extremity Assessment Upper Extremity Assessment: Defer to OT evaluation    Lower Extremity Assessment Lower Extremity Assessment: Generalized weakness;LLE deficits/detail LLE: Unable to fully assess due to pain LLE Coordination: decreased gross motor    Cervical / Trunk Assessment Cervical / Trunk Assessment: Normal  Communication   Communication: No difficulties  Cognition Arousal/Alertness: Awake/alert Behavior During Therapy: WFL for tasks assessed/performed Overall Cognitive Status: Impaired/Different from baseline Area of Impairment: Safety/judgement, Awareness, Orientation, Problem solving                 Orientation Level: Disoriented to       Safety/Judgement: Decreased awareness of safety Awareness: Emergent Problem Solving: Requires verbal cues, Requires tactile cues, Difficulty sequencing General Comments: patient reports nurses had a party in her room last night. (Some mild confusion)        General Comments      Exercises     Assessment/Plan    PT Assessment Patient needs continued PT services  PT Problem List Decreased range of motion;Decreased strength;Decreased coordination;Decreased  cognition;Pain;Decreased activity tolerance;Decreased balance;Decreased mobility;Decreased knowledge of precautions;Decreased safety awareness       PT Treatment Interventions DME instruction;Therapeutic exercise;Gait training;Balance training;Stair training;Therapeutic activities;Patient/family education;Neuromuscular re-education;Functional mobility training;Cognitive remediation;Modalities    PT Goals (Current goals can be found in the Care Plan section)  Acute Rehab PT Goals Patient Stated Goal: to get better PT Goal Formulation: With patient Time For Goal Achievement: 04/13/22 Potential to Achieve Goals: Good    Frequency BID     Co-evaluation               AM-PAC PT "6 Clicks" Mobility  Outcome Measure Help needed turning from your back to your side while in a flat bed without using bedrails?: A Lot Help needed moving from lying on your back to sitting on the side of a flat bed without using bedrails?: A Lot Help needed moving to and from a bed to a chair (including a wheelchair)?: Total Help needed standing up from a chair using your arms (e.g., wheelchair or bedside chair)?: A Lot Help needed to walk in hospital room?: Total Help needed climbing 3-5 steps with a railing? : Total 6 Click Score: 9    End of Session Equipment Utilized During Treatment: Gait belt Activity Tolerance: Patient limited by pain;Other (comment) (nausea) Patient left: in chair;with call  bell/phone within reach;with chair alarm set Nurse Communication: Mobility status PT Visit Diagnosis: Unsteadiness on feet (R26.81);Other abnormalities of gait and mobility (R26.89);Repeated falls (R29.6);Muscle weakness (generalized) (M62.81);Difficulty in walking, not elsewhere classified (R26.2);Pain Pain - Right/Left: Left Pain - part of body: Leg;Hip    Time: 6712-4580 (9983-3825) PT Time Calculation (min) (ACUTE ONLY): 8 min   Charges:   PT Evaluation $PT Eval Moderate Complexity: 1 Mod PT  Treatments $Therapeutic Activity: 8-22 mins        Diana Frost, PT, GCS 03/30/22,10:49 AM

## 2022-03-30 NOTE — Progress Notes (Signed)
Subjective:  Patient reports pain as moderate.  No other complaints.  Objective:   VITALS:   Vitals:   03/29/22 1942 03/30/22 0010 03/30/22 0500 03/30/22 0736  BP: 107/62 120/63 113/65 108/60  Pulse: 89 88 (!) 106 95  Resp: '17 17 17   '$ Temp: 98.1 F (36.7 C) 98 F (36.7 C) 98.9 F (37.2 C) 98.5 F (36.9 C)  TempSrc:      SpO2: 100% 100% 95% 91%  Weight:      Height:        PHYSICAL EXAM:  ABD soft Sensation intact distally Dorsiflexion/Plantar flexion intact Incision: dressing C/D/I No cellulitis present  LABS  Results for orders placed or performed during the hospital encounter of 03/28/22 (from the past 24 hour(s))  Glucose, capillary     Status: Abnormal   Collection Time: 03/29/22  4:25 PM  Result Value Ref Range   Glucose-Capillary 124 (H) 70 - 99 mg/dL  Glucose, capillary     Status: Abnormal   Collection Time: 03/29/22  8:05 PM  Result Value Ref Range   Glucose-Capillary 126 (H) 70 - 99 mg/dL   Comment 1 Notify RN   Glucose, capillary     Status: Abnormal   Collection Time: 03/29/22 11:54 PM  Result Value Ref Range   Glucose-Capillary 150 (H) 70 - 99 mg/dL  Glucose, capillary     Status: Abnormal   Collection Time: 03/30/22 12:12 AM  Result Value Ref Range   Glucose-Capillary 142 (H) 70 - 99 mg/dL   Comment 1 Notify RN   Glucose, capillary     Status: Abnormal   Collection Time: 03/30/22  4:59 AM  Result Value Ref Range   Glucose-Capillary 118 (H) 70 - 99 mg/dL   Comment 1 Notify RN   CBC     Status: Abnormal   Collection Time: 03/30/22  5:16 AM  Result Value Ref Range   WBC 7.8 4.0 - 10.5 K/uL   RBC 3.17 (L) 3.87 - 5.11 MIL/uL   Hemoglobin 9.8 (L) 12.0 - 15.0 g/dL   HCT 30.9 (L) 36.0 - 46.0 %   MCV 97.5 80.0 - 100.0 fL   MCH 30.9 26.0 - 34.0 pg   MCHC 31.7 30.0 - 36.0 g/dL   RDW 14.0 11.5 - 15.5 %   Platelets 167 150 - 400 K/uL   nRBC 0.0 0.0 - 0.2 %  Basic metabolic panel     Status: Abnormal   Collection Time: 03/30/22  5:16 AM   Result Value Ref Range   Sodium 140 135 - 145 mmol/L   Potassium 3.7 3.5 - 5.1 mmol/L   Chloride 107 98 - 111 mmol/L   CO2 25 22 - 32 mmol/L   Glucose, Bld 124 (H) 70 - 99 mg/dL   BUN 14 8 - 23 mg/dL   Creatinine, Ser 0.73 0.44 - 1.00 mg/dL   Calcium 8.2 (L) 8.9 - 10.3 mg/dL   GFR, Estimated >60 >60 mL/min   Anion gap 8 5 - 15  Magnesium     Status: None   Collection Time: 03/30/22  5:16 AM  Result Value Ref Range   Magnesium 1.8 1.7 - 2.4 mg/dL  Glucose, capillary     Status: Abnormal   Collection Time: 03/30/22  7:47 AM  Result Value Ref Range   Glucose-Capillary 124 (H) 70 - 99 mg/dL  Glucose, capillary     Status: Abnormal   Collection Time: 03/30/22 12:06 PM  Result Value Ref Range   Glucose-Capillary 250 (  H) 70 - 99 mg/dL    DG HIP UNILAT WITH PELVIS 2-3 VIEWS LEFT  Result Date: 03/29/2022 CLINICAL DATA:  Left hip pinning. EXAM: DG HIP (WITH OR WITHOUT PELVIS) 2-3V LEFT COMPARISON:  03/28/2022 FINDINGS: 2 intraoperative spot fluoro films obtained after placement of 4 cannulated compression screws in the proximal left femur. IMPRESSION: Intraoperative spot fluoro films during left hip pinning for subcapital femoral neck fracture. No evidence for immediate hardware complication. Electronically Signed   By: Misty Stanley M.D.   On: 03/29/2022 12:12   DG C-Arm 1-60 Min-No Report  Result Date: 03/29/2022 Fluoroscopy was utilized by the requesting physician.  No radiographic interpretation.   DG Chest 1 View  Result Date: 03/28/2022 CLINICAL DATA:  Fall, left hip fracture EXAM: CHEST  1 VIEW COMPARISON:  None Available. FINDINGS: Lungs are well expanded, symmetric, and clear. No pneumothorax or pleural effusion. Cardiac size within normal limits. Coronary artery stenting noted. Pulmonary vascularity is normal. Osseous structures are age-appropriate. No acute bone abnormality. IMPRESSION: No active disease. Electronically Signed   By: Fidela Salisbury M.D.   On: 03/28/2022 19:49    DG Knee Complete 4 Views Left  Result Date: 03/28/2022 CLINICAL DATA:  Fall, left knee pain EXAM: LEFT KNEE - COMPLETE 4+ VIEW COMPARISON:  None Available. FINDINGS: Normal alignment. No acute fracture or dislocation. Joint spaces are not optimally profiled but appear preserved. No effusion. Vascular calcifications are noted. IMPRESSION: 1. No acute fracture or dislocation. Electronically Signed   By: Fidela Salisbury M.D.   On: 03/28/2022 19:49   DG Hip Unilat W or Wo Pelvis 2-3 Views Left  Result Date: 03/28/2022 CLINICAL DATA:  Fall, pelvic pain, left hip pain EXAM: DG HIP (WITH OR WITHOUT PELVIS) 2-3V LEFT COMPARISON:  None Available. FINDINGS: There is an acute, mildly impacted, anatomically aligned subcapital left femoral neck fracture. Femoral head is still seated within the left acetabulum and left hip joint space appears preserved. Mild superimposed left hip degenerative arthritis. Right hip ORIF has been performed with 3 partially threaded screws. Mild right hip degenerative arthritis. Pelvis appears intact. Vascular calcifications noted. IMPRESSION: 1. Acute, mildly impacted, anatomically aligned subcapital left femoral neck fracture. Electronically Signed   By: Fidela Salisbury M.D.   On: 03/28/2022 19:48    Assessment/Plan: 1 Day Post-Op   Principal Problem:   Closed displaced fracture of left femoral neck (HCC) Active Problems:   Type 1 diabetes mellitus ([place])   Coronary artery disease involving native coronary artery of native heart   Hypokalemia   Normocytic anemia   Left displaced femoral neck fracture (HCC)   Advance diet Up with therapy Discharge to SNF when medically stable   Lovell Sheehan , MD 03/30/2022, 12:58 PM

## 2022-03-30 NOTE — Progress Notes (Signed)
Pharmacy Lovenox Dosing  76 y.o. female admitted with Hip Pain . Patient ordered Lovenox 30 mg daily for VTE prophylaxis.   Filed Weights   03/28/22 1859  Weight: 53.1 kg (117 lb)    Body mass index is 21.4 kg/m.  Estimated Creatinine Clearance: 46.6 mL/min (by C-G formula based on SCr of 0.73 mg/dL).  Will adjust Lovenox dosing to 40 mg Q24 hours.   Diana Frost A Diana Frost 03/30/2022 2:26 PM

## 2022-03-30 NOTE — Progress Notes (Signed)
Per verbal order, insulin pump removed. MD placing SSI orders.

## 2022-03-31 ENCOUNTER — Encounter: Payer: Self-pay | Admitting: Specialist

## 2022-03-31 DIAGNOSIS — S72002A Fracture of unspecified part of neck of left femur, initial encounter for closed fracture: Secondary | ICD-10-CM | POA: Diagnosis not present

## 2022-03-31 LAB — MAGNESIUM: Magnesium: 1.8 mg/dL (ref 1.7–2.4)

## 2022-03-31 LAB — BASIC METABOLIC PANEL
Anion gap: 14 (ref 5–15)
BUN: 20 mg/dL (ref 8–23)
CO2: 19 mmol/L — ABNORMAL LOW (ref 22–32)
Calcium: 8.2 mg/dL — ABNORMAL LOW (ref 8.9–10.3)
Chloride: 103 mmol/L (ref 98–111)
Creatinine, Ser: 1.02 mg/dL — ABNORMAL HIGH (ref 0.44–1.00)
GFR, Estimated: 57 mL/min — ABNORMAL LOW (ref >60)
Glucose, Bld: 317 mg/dL — ABNORMAL HIGH (ref 70–99)
Potassium: 3.9 mmol/L (ref 3.5–5.1)
Sodium: 136 mmol/L (ref 135–145)

## 2022-03-31 LAB — GLUCOSE, CAPILLARY
Glucose-Capillary: 278 mg/dL — ABNORMAL HIGH (ref 70–99)
Glucose-Capillary: 326 mg/dL — ABNORMAL HIGH (ref 70–99)
Glucose-Capillary: 192 mg/dL — ABNORMAL HIGH (ref 70–99)
Glucose-Capillary: 56 mg/dL — ABNORMAL LOW (ref 70–99)
Glucose-Capillary: 232 mg/dL — ABNORMAL HIGH (ref 70–99)
Glucose-Capillary: 34 mg/dL — CL (ref 70–99)
Glucose-Capillary: 131 mg/dL — ABNORMAL HIGH (ref 70–99)
Glucose-Capillary: 118 mg/dL — ABNORMAL HIGH (ref 70–99)

## 2022-03-31 LAB — CBC
HCT: 28.9 % — ABNORMAL LOW (ref 36.0–46.0)
Hemoglobin: 9.3 g/dL — ABNORMAL LOW (ref 12.0–15.0)
MCH: 31 pg (ref 26.0–34.0)
MCHC: 32.2 g/dL (ref 30.0–36.0)
MCV: 96.3 fL (ref 80.0–100.0)
Platelets: 160 10*3/uL (ref 150–400)
RBC: 3 MIL/uL — ABNORMAL LOW (ref 3.87–5.11)
RDW: 13.6 % (ref 11.5–15.5)
WBC: 8.8 10*3/uL (ref 4.0–10.5)
nRBC: 0 % (ref 0.0–0.2)

## 2022-03-31 MED ORDER — INSULIN GLARGINE-YFGN 100 UNIT/ML ~~LOC~~ SOLN
10.0000 [IU] | Freq: Every day | SUBCUTANEOUS | Status: DC
  Administered 2022-03-31: 10 [IU] via SUBCUTANEOUS
  Filled 2022-03-31 (×3): qty 0.1

## 2022-03-31 MED ORDER — ACETAMINOPHEN 325 MG PO TABS: 325.0000 mg | ORAL_TABLET | Freq: Four times a day (QID) | ORAL | Status: DC | PRN

## 2022-03-31 MED ORDER — INSULIN ASPART 100 UNIT/ML IJ SOLN
3.0000 [IU] | Freq: Three times a day (TID) | INTRAMUSCULAR | Status: DC
  Administered 2022-03-31: 3 [IU] via SUBCUTANEOUS
  Filled 2022-03-31: qty 1

## 2022-03-31 NOTE — Plan of Care (Signed)
  Problem: Education: Goal: Ability to describe self-care measures that may prevent or decrease complications (Diabetes Survival Skills Education) will improve Outcome: Progressing Goal: Individualized Educational Video(s) Outcome: Progressing   Problem: Coping: Goal: Ability to adjust to condition or change in health will improve Outcome: Progressing   Problem: Fluid Volume: Goal: Ability to maintain a balanced intake and output will improve Outcome: Progressing   Problem: Health Behavior/Discharge Planning: Goal: Ability to identify and utilize available resources and services will improve Outcome: Progressing Goal: Ability to manage health-related needs will improve Outcome: Progressing   Problem: Metabolic: Goal: Ability to maintain appropriate glucose levels will improve Outcome: Progressing   Problem: Nutritional: Goal: Maintenance of adequate nutrition will improve Outcome: Progressing Goal: Progress toward achieving an optimal weight will improve Outcome: Progressing   Problem: Skin Integrity: Goal: Risk for impaired skin integrity will decrease Outcome: Progressing   Problem: Tissue Perfusion: Goal: Adequacy of tissue perfusion will improve Outcome: Progressing   Problem: Education: Goal: Verbalization of understanding the information provided (i.e., activity precautions, restrictions, etc) will improve Outcome: Progressing Goal: Individualized Educational Video(s) Outcome: Progressing   Problem: Activity: Goal: Ability to ambulate and perform ADLs will improve Outcome: Progressing   Problem: Clinical Measurements: Goal: Postoperative complications will be avoided or minimized Outcome: Progressing   Problem: Self-Concept: Goal: Ability to maintain and perform role responsibilities to the fullest extent possible will improve Outcome: Progressing   Problem: Pain Management: Goal: Pain level will decrease Outcome: Progressing   Problem:  Education: Goal: Knowledge of General Education information will improve Description: Including pain rating scale, medication(s)/side effects and non-pharmacologic comfort measures Outcome: Progressing   Problem: Health Behavior/Discharge Planning: Goal: Ability to manage health-related needs will improve Outcome: Progressing   Problem: Clinical Measurements: Goal: Ability to maintain clinical measurements within normal limits will improve Outcome: Progressing Goal: Will remain free from infection Outcome: Progressing Goal: Diagnostic test results will improve Outcome: Progressing Goal: Respiratory complications will improve Outcome: Progressing Goal: Cardiovascular complication will be avoided Outcome: Progressing   Problem: Activity: Goal: Risk for activity intolerance will decrease Outcome: Progressing   Problem: Nutrition: Goal: Adequate nutrition will be maintained Outcome: Progressing   Problem: Coping: Goal: Level of anxiety will decrease Outcome: Progressing   Problem: Elimination: Goal: Will not experience complications related to bowel motility Outcome: Progressing Goal: Will not experience complications related to urinary retention Outcome: Progressing   Problem: Pain Managment: Goal: General experience of comfort will improve Outcome: Progressing   Problem: Safety: Goal: Ability to remain free from injury will improve Outcome: Progressing   Problem: Skin Integrity: Goal: Risk for impaired skin integrity will decrease Outcome: Progressing   

## 2022-03-31 NOTE — Progress Notes (Signed)
PT Cancellation Note  Patient Details Name: Diana Frost MRN: [id number] DOB: [birth year]   Cancelled Treatment:     PT attempt. Upon entering room, pt requesting to use the BR. Unwilling to allow author to assist. RN tech made aware. Author will return later this date and continue to follow per current POC.    Willette Pa 03/31/2022, 2:59 PM

## 2022-03-31 NOTE — Progress Notes (Signed)
Hypoglycemic Event  CBG: 34  Treatment: D50 50 mL (25 gm)  Symptoms: None  Follow-up CBG: Time:1542 CBG Result: 232  Possible Reasons for Event: Inadequate meal intake  Comments/MD notified: Dr Billie Ruddy notified     Jakala Herford A Alvita Fana

## 2022-03-31 NOTE — Progress Notes (Signed)
Subjective: 2 Days Post-Op Procedure(s) (LRB): PERCUTANEOUS FIXATION OF FEMORAL NECK (Left) Patient is awake and alert in her bed.  Minimal pain she says.  Plan is to go to rehab.  Patient reports pain as when bed available..  Objective:   VITALS:   Vitals:   03/30/22 2318 03/31/22 0737  BP: (!) 121/51 (!) 116/54  Pulse: (!) 110 (!) 104  Resp: 14 18  Temp: 98.4 F (36.9 C) 98.1 F (36.7 C)  SpO2: 94% 97%    Neurologically intact Incision: dressing C/D/I  LABS Recent Labs    03/29/22 0520 03/30/22 0516 03/31/22 0359  HGB 9.9* 9.8* 9.3*  HCT 31.2* 30.9* 28.9*  WBC 5.6 7.8 8.8  PLT 159 167 160    Recent Labs    03/29/22 0520 03/30/22 0516 03/31/22 0359  NA 143 140 136  K 3.7 3.7 3.9  BUN '16 14 20  '$ CREATININE 0.83 0.73 1.02*  GLUCOSE 110* 124* 317*    Recent Labs    03/28/22 2209  INR 1.1     Assessment/Plan: 2 Days Post-Op Procedure(s) (LRB): PERCUTANEOUS FIXATION OF FEMORAL NECK (Left)   Up with therapy Discharge to SNF when bed available.  Touchdown weightbearing left leg only. 81 mg aspirin twice daily for 6 weeks for DVT. Follow-up in my office 12 to 14 days for exam and stitch removal.

## 2022-03-31 NOTE — Plan of Care (Signed)
  Problem: Education: Goal: Individualized Educational Video(s) Outcome: Progressing   Problem: Fluid Volume: Goal: Ability to maintain a balanced intake and output will improve Outcome: Progressing   Problem: Health Behavior/Discharge Planning: Goal: Ability to manage health-related needs will improve Outcome: Progressing   Problem: Metabolic: Goal: Ability to maintain appropriate glucose levels will improve Outcome: Progressing   Problem: Activity: Goal: Ability to ambulate and perform ADLs will improve Outcome: Progressing   Problem: Education: Goal: Knowledge of General Education information will improve Description: Including pain rating scale, medication(s)/side effects and non-pharmacologic comfort measures Outcome: Progressing   Problem: Clinical Measurements: Goal: Will remain free from infection Outcome: Progressing   Problem: Clinical Measurements: Goal: Diagnostic test results will improve Outcome: Progressing

## 2022-03-31 NOTE — NC FL2 (Signed)
[place] LEVEL OF CARE FORM     IDENTIFICATION  Patient Name: Diana Frost Birthdate: 1945-10-17 Sex: female Admission Date (Current Location): 03/28/2022  [facility] and [place] Number:  Engineering geologist and Address:  [facility], [address] [place], [place] [address]      Provider Number: [id number]  Attending Physician Name and Address:  Enzo Bi, MD  Relative Name and Phone Number:  [name], daughter [phone]    Current Level of Care: Hospital Recommended Level of Care: [place] Prior Approval Number:    Date Approved/Denied:   PASRR Number: [id number]  Discharge Plan: SNF    Current Diagnoses: Patient Active Problem List   Diagnosis Date Noted   Normocytic anemia 03/29/2022   Left displaced femoral neck fracture ([place]) 03/29/2022   UTI (urinary tract infection) 10/14/2021   Syncope 10/11/2021   Sinus tachycardia 10/11/2021   Vomiting and diarrhea 10/11/2021   Closed displaced fracture of left femoral neck ([place]) 01/07/2019   Malnutrition of moderate degree 08/14/2016   Non-ST elevation (NSTEMI) myocardial infarction ([place]) 08/13/2016   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   AKI (acute kidney injury) ([place]) 08/13/2016   Hypokalemia 08/13/2016   DKA (diabetic ketoacidoses) 08/12/2016   Melanoma ([place]) 11/01/2015   Stable angina 10/10/2015   Coronary artery disease involving native coronary artery of native heart 11/16/2012   History of insertion of insulin pump 05/24/2012   Type 1 diabetes mellitus ([place]) 02/11/2011   Depression 02/11/2011    Orientation RESPIRATION BLADDER Height & Weight     Self, Time, Situation, Place (mild confusion)  Normal Continent, External catheter Weight: 53.9 kg Height:  '5\' 2"'$  (157.5 cm)  BEHAVIORAL SYMPTOMS/MOOD NEUROLOGICAL BOWEL NUTRITION STATUS      Continent Diet (See DC summary)  AMBULATORY STATUS COMMUNICATION OF NEEDS Skin   Extensive Assist Verbally  Normal, Surgical wounds                       Personal Care Assistance Level of Assistance  Bathing, Feeding, Dressing Bathing Assistance: Maximum assistance Feeding assistance: Independent Dressing Assistance: Limited assistance     Functional Limitations Info             SPECIAL CARE FACTORS FREQUENCY  PT (By licensed PT), OT (By licensed OT)     PT Frequency: 5 times per week OT Frequency: 5 times per week            Contractures Contractures Info: Not present    Additional Factors Info  Code Status, Allergies Code Status Info: Full code Allergies Info: Bupropion, Percocet (Oxycodone-acetaminophen), Statins, Sulfa Antibiotics           Current Medications (03/31/2022):  This is the current hospital active medication list Current Facility-Administered Medications  Medication Dose Route Frequency Provider Last Rate Last Admin   acetaminophen (TYLENOL) tablet 325-650 mg  325-650 mg Oral Q6H PRN Earnestine Leys, MD   650 mg at 03/31/22 0641   alum & mag hydroxide-simeth (MAALOX/MYLANTA) 200-200-20 MG/5ML suspension 30 mL  30 mL Oral Q4H PRN Earnestine Leys, MD       aspirin EC tablet 81 mg  81 mg Oral Daily Jani Gravel, MD   81 mg at 03/31/22 0831   bisacodyl (DULCOLAX) suppository 10 mg  10 mg Rectal Daily PRN Earnestine Leys, MD       buPROPion (WELLBUTRIN XL) 24 hr tablet 150 mg  150 mg Oral q morning Enzo Bi, MD   150 mg  at 03/31/22 0832   chlorhexidine (HIBICLENS) 4 % liquid   Topical Once Jani Gravel, MD       diphenhydrAMINE (BENADRYL) injection 25 mg  25 mg Intravenous Q6H PRN Jani Gravel, MD       enoxaparin (LOVENOX) injection 40 mg  40 mg Subcutaneous Q24H Rito Ehrlich A, RPH   40 mg at 03/31/22 0831   ferrous sulfate tablet 325 mg  325 mg Oral Q breakfast Earnestine Leys, MD   325 mg at 03/31/22 3267   hydrALAZINE (APRESOLINE) injection 5 mg  5 mg Intravenous Q6H PRN Jani Gravel, MD       HYDROcodone-acetaminophen [facility]) 7.5-325 MG per tablet 1-2 tablet   1-2 tablet Oral Q4H PRN Earnestine Leys, MD   1 tablet at 03/31/22 0251   insulin aspart (novoLOG) injection 0-9 Units  0-9 Units Subcutaneous TID WC Enzo Bi, MD   7 Units at 03/31/22 0831   insulin aspart (novoLOG) injection 3 Units  3 Units Subcutaneous TID WC Enzo Bi, MD       insulin glargine-yfgn [facility]) injection 10 Units  10 Units Subcutaneous Daily Enzo Bi, MD       magnesium hydroxide (MILK OF MAGNESIA) suspension 30 mL  30 mL Oral Daily PRN Earnestine Leys, MD   30 mL at 03/31/22 0641   menthol-cetylpyridinium (CEPACOL) lozenge 3 mg  1 lozenge Oral PRN Earnestine Leys, MD       Or   phenol (CHLORASEPTIC) mouth spray 1 spray  1 spray Mouth/Throat PRN Earnestine Leys, MD       metoCLOPramide (REGLAN) tablet 5-10 mg  5-10 mg Oral Q8H PRN Earnestine Leys, MD       Or   metoCLOPramide (REGLAN) injection 5-10 mg  5-10 mg Intravenous Q8H PRN Earnestine Leys, MD       midodrine (PROAMATINE) tablet 5 mg  5 mg Oral TID WC Jani Gravel, MD   5 mg at 03/31/22 1245   morphine (PF) 2 MG/ML injection 0.5-1 mg  0.5-1 mg Intravenous Q2H PRN Earnestine Leys, MD       morphine (PF) 2 MG/ML injection 1 mg  1 mg Intravenous Q3H PRN Jani Gravel, MD   1 mg at 03/29/22 0838   ondansetron (ZOFRAN) injection 4 mg  4 mg Intravenous Q6H PRN Jani Gravel, MD       ondansetron (ZOFRAN-ODT) disintegrating tablet 4 mg  4 mg Oral Q8H PRN Enzo Bi, MD       rosuvastatin (CRESTOR) tablet 10 mg  10 mg Oral QHS Jani Gravel, MD   10 mg at 03/30/22 2137   senna (SENOKOT) tablet 8.6 mg  1 tablet Oral BID Earnestine Leys, MD   8.6 mg at 03/31/22 8099   senna-docusate (Senokot-S) tablet 1 tablet  1 tablet Oral QHS PRN Jani Gravel, MD       sertraline (ZOLOFT) tablet 100 mg  100 mg Oral Daily Enzo Bi, MD   100 mg at 03/31/22 0831   sodium phosphate (FLEET) 7-19 GM/118ML enema 1 enema  1 enema Rectal Once PRN Earnestine Leys, MD       traMADol Veatrice Bourbon) tablet 50 mg  50 mg Oral Q6H PRN Jani Gravel, MD       zolpidem Lorrin Mais) tablet 5  mg  5 mg Oral QHS PRN Earnestine Leys, MD         Discharge Medications: Please see discharge summary for a list of discharge medications.  Relevant Imaging Results:  Relevant Lab Results:   Additional Information SS  [id number]  Conception Oms, RN

## 2022-03-31 NOTE — Progress Notes (Signed)
  PROGRESS NOTE    Diana Frost  ZOX:[id number] DOB: 1945/08/18 DOA: 03/28/2022 PCP: Latanya Maudlin, NP  147A/147A-AA  LOS: 3 days   Brief hospital course:   Assessment & Plan: Diana Frost is a 76 y.o. female with medical history significant of hypertension, Dm2, CAD, Osteoporosis, presented with mechanical fall while in the kitchen, on wood floor.  Pt landed on her left hip, and c/o pain and called EMS.  Pt states that her legs got tangled.    L femoral neck fracture S/p PERCUTANEOUS FIXATION OF left FEMORAL NECK on 03/29/22 --PT --pain control   Hypokalemia  --monitor and replete PRN   Hx of Hypotension Cont midodrine '5mg'$  po tid   Hyperlipidemia, CAD Cont aspirin '81mg'$  po qday Cont Crestor '10mg'$  po qday   Dm2 With hyperglycemia and hypoglycemia --insulin pump removed around noon on 12/25 --increase glargine to 50u daily --ACHS and SSI TID --daughter requested that pt be placed back on insulin pump tomorrow morning.   DVT prophylaxis: Lovenox SQ Code Status: Full code  Family Communication:  Level of care: Telemetry Medical Dispo:   The patient is from: home Anticipated d/c is to: SNF rehab Anticipated d/c date is: whenever bed available   Subjective and Interval History:  Pt had high BG overnight, which turned out to be due to late night snacking.  BG dropped during the day due to over-correction with too much insulin.  Pt was asymptomatic.  Daughter requested that pt be put back on home insulin pump tomorrow morning.   Objective: Vitals:   03/30/22 2318 03/31/22 0500 03/31/22 0737 03/31/22 1629  BP: (!) 121/51  (!) 116/54 110/60  Pulse: (!) 110  (!) 104 91  Resp: '14  18 14  '$ Temp: 98.4 F (36.9 C)  98.1 F (36.7 C) 98.6 F (37 C)  TempSrc:      SpO2: 94%  97% 98%  Weight:  53.9 kg    Height:        Intake/Output Summary (Last 24 hours) at 03/31/2022 2223 Last data filed at 03/31/2022 1312 Gross per 24 hour  Intake 240 ml  Output  --  Net 240 ml   Filed Weights   03/28/22 1859 03/31/22 0500  Weight: 53.1 kg 53.9 kg    Examination:   Constitutional: NAD, AAOx3 HEENT: conjunctivae and lids normal, EOMI CV: No cyanosis.   RESP: normal respiratory effort, on RA Neuro: II - XII grossly intact.     Data Reviewed: I have personally reviewed labs and imaging studies  Time spent: 35 minutes  Diana Bi, MD [facility] If 7PM-7AM, please contact night-coverage 03/31/2022, 10:23 PM

## 2022-03-31 NOTE — Progress Notes (Signed)
Inpatient Rehab Admissions Coordinator:  ? ?Per therapy recommendations,  patient was screened for CIR candidacy by Claudy Abdallah, MS, CCC-SLP. At this time, Pt. Appears to be a a potential candidate for CIR. I will place   order for rehab consult per protocol for full assessment. Please contact me any with questions. ? ?Mikaya Bunner, MS, CCC-SLP ?Rehab Admissions Coordinator  ?[phone] (celll) ?[phone] (office) ? ?

## 2022-03-31 NOTE — TOC Progression Note (Signed)
Transition of Care [facility]) - Progression Note    Patient Details  Name: Diana Frost MRN: [id number] Date of Birth: [birth year]  Transition of Care [facility]) CM/SW [place], RN Phone Number: 03/31/2022, 11:45 AM  Clinical Narrative:    Family is wanting the patient to go to CIR Waiting to hear back from CIR about that Sent out to [place] facilities to see if they have a bed offer in the event that CIR is not appropriate        Expected Discharge Plan and Services                                               Social Determinants of Health (SDOH) Interventions SDOH Screenings   Food Insecurity: No Food Insecurity (03/29/2022)  Housing: Low Risk  (03/29/2022)  Transportation Needs: No Transportation Needs (03/29/2022)  Utilities: Not At Risk (03/29/2022)  Tobacco Use: Medium Risk (03/31/2022)    Readmission Risk Interventions     No data to display

## 2022-03-31 NOTE — Progress Notes (Signed)
Physical Therapy Treatment Patient Details Name: Diana Frost MRN: [id number] DOB: 1946-01-30 Today's Date: 03/31/2022   History of Present Illness Diana Frost is a 76 y.o. female with medical history significant of hypertension, Dm2, CAD, Osteoporosis, presents with mechanical fall while in the kitchen, on wood floor.  Pt landed on her left hip, and c/o pain and called EMS.  Pt states that her legs got tangled. Patient is s/p L femur ORIF, TTWB    PT Comments    Pt was supine in bed upon arriving. Was given pain medicine ~ 1 hour prior. More lethargic than previously observed earlier in the day. She was cooperative and did agree to OOB activity. Required extensive +1 assistance to exit bed and stand 1 x to RW. C/o severe dizziness/nausea upon standing. BP once returned to supine 117/72. Has dealt with orthostatic hypotension/Low BP in previous admission. Discussed at length with pt/pt's daughter, DC disposition. Both pt and daughter are requesting CIR level of rehab. Pt will benefit form continued skilled pT to maximize her independence while decreasing caregiver burden. Pt's spouse is bed bound at home but per daughter, she can arrange 24/7 assist at DC if needed. Author will return this afternoon for PM/BID session.   Recommendations for follow up therapy are one component of a multi-disciplinary discharge planning process, led by the attending physician.  Recommendations may be updated based on patient status, additional functional criteria and insurance authorization.  Follow Up Recommendations  Acute inpatient rehab (3hours/day)     Assistance Recommended at Discharge Frequent or constant Supervision/Assistance  Patient can return home with the following A lot of help with walking and/or transfers;A lot of help with bathing/dressing/bathroom   Equipment Recommendations  Other (comment) (defer to next level of care)       Precautions / Restrictions Precautions Precautions:  Fall Restrictions Weight Bearing Restrictions: Yes LLE Weight Bearing: Touchdown weight bearing     Mobility  Bed Mobility Overal bed mobility: Needs Assistance Bed Mobility: Supine to Sit  Supine to sit: Mod assist Sit to supine: Min assist   General bed mobility comments: mod assist + extensive abmount of time required to exiot R side of bed. Vcs for technique and sequencing. Only min assist required to returnt o supine from EOB short sit.    Transfers Overall transfer level: Needs assistance Equipment used: Rolling walker (2 wheels) Transfers: Sit to/from Stand Sit to Stand: Min assist, Mod assist, From elevated surface    General transfer comment: pt tolerated standing 1 x from elevated bed height with vcs + min-mod assist. she had already sat in recliner x several hours earlier in the day but 2/2 to c/o dizziness/nausea, elected to returnt o bed after standing EOB 1 x. reviewed ther ex and will have pt perform more in PM session    Ambulation/Gait    General Gait Details: unable 2/2 to c/o severe dizziness upon standing. " I was put in the hospital ~ 6 months ago due to dizziness. encouraged increased fluid intake. Pt was vomiting ealier in the day.    Balance Overall balance assessment: Needs assistance Sitting-balance support: Feet supported Sitting balance-Leahy Scale: Good     Standing balance support: Bilateral upper extremity supported, During functional activity, Reliant on assistive device for balance Standing balance-Leahy Scale: Fair Standing balance comment: difficult to assess 2/2 to pt limited standing tolerance due to dizziness/nausea. BP was stable.         Cognition Arousal/Alertness: Lethargic, Suspect due to medications Behavior  During Therapy: Flat affect Overall Cognitive Status: Within Functional Limits for tasks assessed          General Comments: Pt is A but lethargic. Was given pain meds ~ 1 hour prior. she is agreeable and cooperative  throughout but severely limited by dizziness. BP was 117/72 in standing but needed to sit and return to supine 2/2 to feeling nasous/lightheaded and endorsing dizziness.           General Comments General comments (skin integrity, edema, etc.): will review and have pt perform HEP exercises in PM session      Pertinent Vitals/Pain Pain Assessment Pain Assessment: 0-10 Pain Score: 4  Faces Pain Scale: Hurts a little bit Pain Location: L LE Pain Descriptors / Indicators: Discomfort, Grimacing, Guarding Pain Intervention(s): Limited activity within patient's tolerance, Monitored during session, Premedicated before session, Repositioned     PT Goals (current goals can now be found in the care plan section) Acute Rehab PT Goals Patient Stated Goal: rehab then home Progress towards PT goals: Progressing toward goals    Frequency    BID      PT Plan Discharge plan needs to be updated       AM-PAC PT "6 Clicks" Mobility   Outcome Measure  Help needed turning from your back to your side while in a flat bed without using bedrails?: A Lot Help needed moving from lying on your back to sitting on the side of a flat bed without using bedrails?: A Lot Help needed moving to and from a bed to a chair (including a wheelchair)?: A Lot Help needed standing up from a chair using your arms (e.g., wheelchair or bedside chair)?: A Lot Help needed to walk in hospital room?: A Lot Help needed climbing 3-5 steps with a railing? : Total 6 Click Score: 11    End of Session   Activity Tolerance: Patient tolerated treatment well;Other (comment) (limited by dizziness/nausea) Patient left: in bed;with call bell/phone within reach;with bed alarm set;with nursing/sitter in room (RN tech in room) Nurse Communication: Mobility status PT Visit Diagnosis: Unsteadiness on feet (R26.81);Other abnormalities of gait and mobility (R26.89);Repeated falls (R29.6);Muscle weakness (generalized)  (M62.81);Difficulty in walking, not elsewhere classified (R26.2);Pain Pain - Right/Left: Left Pain - part of body: Leg;Hip     Time: 3825-0539 PT Time Calculation (min) (ACUTE ONLY): 19 min  Charges:  $Therapeutic Activity: 8-22 mins                    Julaine Fusi PTA 03/31/22, 12:42 PM

## 2022-03-31 NOTE — Inpatient Diabetes Management (Addendum)
Inpatient Diabetes Program Recommendations  AACE/ADA: New Consensus Statement on Inpatient Glycemic Control (2015)  Target Ranges:  Prepandial:   less than 140 mg/dL      Peak postprandial:   less than 180 mg/dL (1-2 hours)      Critically ill patients:  140 - 180 mg/dL    Latest Reference Range & Units 03/30/22 00:12 03/30/22 04:59 03/30/22 07:47 03/30/22 12:06 03/30/22 16:39 03/30/22 21:14 03/31/22 01:30  Glucose-Capillary 70 - 99 mg/dL 142 (H)  1 unit Novolog  118 (H) 124 (H)  1 unit Novolog  250 (H)  3 units Novolog  159 (H)  2 units Novolog  214 (H)    5 units Semglee 278 (H)  (H): Data is abnormally high  Latest Reference Range & Units 03/31/22 07:57  Glucose-Capillary 70 - 99 mg/dL 326 (H)  7 units Novolog   (H): Data is abnormally high   Admit with: Fall with L femoral neck fracture   History: Type 1 Diabetes (diagnosed at age 76) (Needs Basal, Correction, and Meal Coverage Insulins)  Home DM Meds: OmniPod 5 Insulin Pump with Humalog       Dexcom CGM  Current Orders: Semglee 5 units QHS     Novolog Sensitive Correction Scale/ SSI (0-9 units) TID [facility]    Surgery 12/24  Insulin Pump stopped at 12pm on 12/25 per MD notes  MD- Please consider:  1. Increase Semglee to 8 units QHS Please give an extra 3 units Semglee X 1 dose this AM  2. Start Novolog Meal Coverage: Novolog 3 units TID with meals HOLD if pt NPO HOLD if pt eats <50% meals    Addendum 11am--Met with pt at bedside.  Pt told me she took her Insulin Pump pod off yesterday.  Uses the app on her phone to control her Pump.  No changes made to her insulin pump rates at last ENDO visit on 03/16/2022.  Has all pump supplies at home.  Discussed with pt that we will try to mimic the amount of insulin she gets on her pump at home with injections in the hospital.  Pt confirms she is Type 1.  Explained to pt that we have her on Semglee insulin and Novolog insulin--Explained the doses and how/when we are  giving.  Explained to pt that I have requested an increase in her long-acting insulin and have also requested Novolog meal coverage.  Pt appreciative of visit and did not have any questions for me at this time.     ENDO: Dr. Honor Junes with [facility] Last Seen 03/16/2022--Was having trouble connecting her Dexcom to her insulin pump Pump Settings were as follows: Basal rates 12 AM = 0.3 9 AM = 0.5 12 PM = 0.45 6 PM = 0.4 9 PM = 0.2 TDD basal: 8.7 units  Bolus settings I/C: 13 ISF: 60 Target Glucose: 120 Active insulin time: 5 hours     --Will follow patient during hospitalization--  Wyn Quaker RN, MSN, [place] Diabetes Coordinator Inpatient Glycemic Control Team Team Pager: [phone] (8a-5p)

## 2022-04-01 DIAGNOSIS — S72002A Fracture of unspecified part of neck of left femur, initial encounter for closed fracture: Secondary | ICD-10-CM | POA: Diagnosis not present

## 2022-04-01 LAB — CBC
HCT: 27.5 % — ABNORMAL LOW (ref 36.0–46.0)
Hemoglobin: 8.9 g/dL — ABNORMAL LOW (ref 12.0–15.0)
MCH: 30.8 pg (ref 26.0–34.0)
MCHC: 32.4 g/dL (ref 30.0–36.0)
MCV: 95.2 fL (ref 80.0–100.0)
Platelets: 185 10*3/uL (ref 150–400)
RBC: 2.89 MIL/uL — ABNORMAL LOW (ref 3.87–5.11)
RDW: 13.9 % (ref 11.5–15.5)
WBC: 6.2 10*3/uL (ref 4.0–10.5)
nRBC: 0 % (ref 0.0–0.2)

## 2022-04-01 LAB — BASIC METABOLIC PANEL
Anion gap: 6 (ref 5–15)
BUN: 18 mg/dL (ref 8–23)
CO2: 28 mmol/L (ref 22–32)
Calcium: 8.4 mg/dL — ABNORMAL LOW (ref 8.9–10.3)
Chloride: 106 mmol/L (ref 98–111)
Creatinine, Ser: 0.75 mg/dL (ref 0.44–1.00)
GFR, Estimated: >60 mL/min (ref >60)
Glucose, Bld: 126 mg/dL — ABNORMAL HIGH (ref 70–99)
Potassium: 3.8 mmol/L (ref 3.5–5.1)
Sodium: 140 mmol/L (ref 135–145)

## 2022-04-01 LAB — MAGNESIUM: Magnesium: 1.9 mg/dL (ref 1.7–2.4)

## 2022-04-01 LAB — GLUCOSE, CAPILLARY
Glucose-Capillary: 227 mg/dL — ABNORMAL HIGH (ref 70–99)
Glucose-Capillary: 192 mg/dL — ABNORMAL HIGH (ref 70–99)
Glucose-Capillary: 143 mg/dL — ABNORMAL HIGH (ref 70–99)
Glucose-Capillary: 196 mg/dL — ABNORMAL HIGH (ref 70–99)

## 2022-04-01 MED ORDER — INSULIN PUMP
Freq: Three times a day (TID) | SUBCUTANEOUS | Status: DC
  Administered 2022-04-01: 1.45 via SUBCUTANEOUS
  Administered 2022-04-01: 1.3 via SUBCUTANEOUS
  Filled 2022-04-01: qty 1

## 2022-04-01 MED ORDER — CYANOCOBALAMIN 1000 MCG/ML IJ SOLN
1000.0000 ug | Freq: Once | INTRAMUSCULAR | Status: AC
  Administered 2022-04-01: 1000 ug via INTRAMUSCULAR
  Filled 2022-04-01: qty 1

## 2022-04-01 NOTE — Plan of Care (Signed)
  Problem: Education: Goal: Ability to describe self-care measures that may prevent or decrease complications (Diabetes Survival Skills Education) will improve Outcome: Progressing   Problem: Coping: Goal: Ability to adjust to condition or change in health will improve Outcome: Progressing   Problem: Fluid Volume: Goal: Ability to maintain a balanced intake and output will improve Outcome: Progressing   Problem: Nutritional: Goal: Maintenance of adequate nutrition will improve Outcome: Progressing   Problem: Skin Integrity: Goal: Risk for impaired skin integrity will decrease Outcome: Progressing   Problem: Education: Goal: Verbalization of understanding the information provided (i.e., activity precautions, restrictions, etc) will improve Outcome: Progressing   Problem: Activity: Goal: Ability to ambulate and perform ADLs will improve Outcome: Progressing   Problem: Elimination: Goal: Will not experience complications related to bowel motility Outcome: Progressing   Problem: Skin Integrity: Goal: Risk for impaired skin integrity will decrease Outcome: Progressing

## 2022-04-01 NOTE — Progress Notes (Signed)
Physical Therapy Treatment Patient Details Name: Diana Frost MRN: [id number] DOB: 03-06-1946 Today's Date: 04/01/2022   History of Present Illness Diana Frost is a 76 y.o. female with medical history significant of hypertension, Dm2, CAD, Osteoporosis, presents with mechanical fall while in the kitchen, on wood floor.  Pt landed on her left hip, and c/o pain and called EMS.  Pt states that her legs got tangled. Patient is s/p L femur ORIF, TTWB    PT Comments    Pt was long sitting in bed upon arriving. Supportive daughter arrived during session. Pt is A and O and agreeable to session. Did endorse being fatigue and feeling weak but was able to fully participate. She required increased assistance to exit bed this afternoon. Stood 2 x EOB prior to stand pivot to recliner(towards R). Pt does well maintaining TTWB with STS but struggles to maintain with pivoting. Vcs throughout for increased UE wt bearing to un weight but pt struggles. Author reissued HEP handout with education/instructions but pt unwilling to perform 2/2 to fatigue. Will need extensive PT going forward to maximize independence with all ADLs.     Recommendations for follow up therapy are one component of a multi-disciplinary discharge planning process, led by the attending physician.  Recommendations may be updated based on patient status, additional functional criteria and insurance authorization.  Follow Up Recommendations  Acute inpatient rehab (3hours/day)     Assistance Recommended at Discharge Frequent or constant Supervision/Assistance  Patient can return home with the following A lot of help with walking and/or transfers;A lot of help with bathing/dressing/bathroom;Assistance with cooking/housework;Direct supervision/assist for medications management;Assist for transportation;Direct supervision/assist for financial management;Help with stairs or ramp for entrance   Equipment Recommendations  Other (comment)  (defer to next level of care)       Precautions / Restrictions Precautions Precautions: Fall Restrictions Weight Bearing Restrictions: Yes LLE Weight Bearing: Touchdown weight bearing     Mobility  Bed Mobility Overal bed mobility: Needs Assistance Bed Mobility: Supine to Sit  Supine to sit: Mod assist, Max assist, HOB elevated Sit to supine: Mod assist   General bed mobility comments: Increased time and assistance to safely achieve EOB short sit. Pain limited    Transfers Overall transfer level: Needs assistance Equipment used: Rolling walker (2 wheels) Transfers: Bed to chair/wheelchair/BSC, Sit to/from Stand Sit to Stand: Mod assist, From elevated surface Stand pivot transfers: Max assist         General transfer comment: Pt was able to stand 2 x EOB(elevated) prior to stand pivot towards R to recliner. She does well adhereing to proper TTWB LLE but struggles to pivot on RLE. INcreased time for all mobility and transfers due to pain.    Ambulation/Gait Ambulation/Gait assistance: Mod assist Gait Distance (Feet): 3 Feet Assistive device: Rolling walker (2 wheels) Gait Pattern/deviations: Step-to pattern, Antalgic Gait velocity: decreased     General Gait Details: unable due to inability to perform TTWB LLE while advancing RLE    Balance Overall balance assessment: Needs assistance Sitting-balance support: Feet supported Sitting balance-Leahy Scale: Good     Standing balance support: Bilateral upper extremity supported, During functional activity, Reliant on assistive device for balance Standing balance-Leahy Scale: Fair Standing balance comment: reliant on RW. limited by wt bearing       Cognition Arousal/Alertness: Awake/alert Behavior During Therapy: WFL for tasks assessed/performed Overall Cognitive Status: Within Functional Limits for tasks assessed Area of Impairment: Safety/judgement, Awareness, Orientation, Problem solving    Orientation Level:  Disoriented to, Situation  Safety/Judgement: Decreased awareness of safety, Decreased awareness of deficits     General Comments: Pt is A and O x 3. Agrees to session and cooperative throughout           General Comments General comments (skin integrity, edema, etc.): issued HEP handout but pt endorses feeling too fatigue to currently perform. Author will review in AM session next date      Pertinent Vitals/Pain Pain Assessment Pain Assessment: 0-10 Pain Score: 6  Faces Pain Scale: Hurts little more Pain Location: L LE Pain Descriptors / Indicators: Discomfort, Grimacing, Guarding Pain Intervention(s): Limited activity within patient's tolerance, Monitored during session, Premedicated before session, Repositioned     PT Goals (current goals can now be found in the care plan section) Acute Rehab PT Goals Patient Stated Goal: rehab then home Progress towards PT goals: Progressing toward goals    Frequency    BID      PT Plan Current plan remains appropriate       AM-PAC PT "6 Clicks" Mobility   Outcome Measure  Help needed turning from your back to your side while in a flat bed without using bedrails?: A Lot Help needed moving from lying on your back to sitting on the side of a flat bed without using bedrails?: A Lot Help needed moving to and from a bed to a chair (including a wheelchair)?: A Lot Help needed standing up from a chair using your arms (e.g., wheelchair or bedside chair)?: A Lot Help needed to walk in hospital room?: Total Help needed climbing 3-5 steps with a railing? : Total 6 Click Score: 10    End of Session   Activity Tolerance: Patient tolerated treatment well;Patient limited by pain;Patient limited by fatigue Patient left: in chair;with call bell/phone within reach;with chair alarm set;with family/visitor present Nurse Communication: Mobility status PT Visit Diagnosis: Unsteadiness on feet (R26.81);Other abnormalities of gait and mobility  (R26.89);Repeated falls (R29.6);Muscle weakness (generalized) (M62.81);Difficulty in walking, not elsewhere classified (R26.2);Pain Pain - Right/Left: Left Pain - part of body: Leg;Hip     Time: 8115-7262 PT Time Calculation (min) (ACUTE ONLY): 23 min  Charges:  $Therapeutic Exercise: 8-22 mins $Therapeutic Activity: 8-22 mins                     Julaine Fusi PTA 04/01/22, 4:30 PM

## 2022-04-01 NOTE — PMR Pre-admission (Signed)
PMR Admission Coordinator Pre-Admission Assessment  Patient: Diana Frost is an 76 y.o., female MRN: [id number] DOB: 21-Aug-1945 Height: '5\' 2"'$  (157.5 cm) Weight: 53.9 kg  Insurance Information HMO:     PPO:      PCP:      IPA:      80/20:      OTHER:  PRIMARY: Medicare A/B      Policy#: [id number]       Subscriber: pt CM Name:       Phone#:      Fax#:  Pre-Cert#: verified Civil engineer, contracting:  Benefits:  Phone #:      Name:  Eff. Date: 03/06/10  A and B    Deduct: $1556      Out of Pocket Max: n/a      Life Max: n/a CIR: 100%      SNF: 20 full days Outpatient: 80%     Co-Pay: 20% Home Health: 100%      Co-Pay:  DME: 80%     Co-Pay: 20% Providers:  SECONDARYJacqlyn Krauss      Policy#: [id number]      Phone#: [phone]  Financial Counselor:       Phone#:   The "Data Collection Information Summary" for patients in Inpatient Rehabilitation Facilities with attached "Privacy Act [place] Records" was provided and verbally reviewed with: Patient and Family  Emergency Contact Information Contact Information     Name Relation Home Work Mobile   Royal,Janet Daughter   [phone]       Current Medical History  Patient Admitting Diagnosis: Hip fracture  History of Present Illness: ***    Patient's medical record from [facility] has been reviewed by the rehabilitation admission coordinator and physician.  Past Medical History  Past Medical History:  Diagnosis Date   Coronary artery disease    Diabetes mellitus without complication (HCC)    insulin pump   Headache    since ear problem started   Hypertension    Myocardial infarction ([place]) 11/01/13    Has the patient had major surgery during 100 days prior to admission? Yes  Family History   family history includes Breast cancer in her maternal aunt; Lung cancer in her father.  Current Medications  Current Facility-Administered Medications:    acetaminophen (TYLENOL) tablet 325-650 mg, 325-650 mg, Oral, Q6H PRN,  Wynelle Cleveland, RPH   alum & mag hydroxide-simeth (MAALOX/MYLANTA) 200-200-20 MG/5ML suspension 30 mL, 30 mL, Oral, Q4H PRN, Earnestine Leys, MD   aspirin EC tablet 81 mg, 81 mg, Oral, Daily, Jani Gravel, MD, 81 mg at 03/31/22 0831   bisacodyl (DULCOLAX) suppository 10 mg, 10 mg, Rectal, Daily PRN, Earnestine Leys, MD   buPROPion (WELLBUTRIN XL) 24 hr tablet 150 mg, 150 mg, Oral, q morning, Enzo Bi, MD, 150 mg at 03/31/22 7262   chlorhexidine (HIBICLENS) 4 % liquid, , Topical, Once, Jani Gravel, MD   diphenhydrAMINE (BENADRYL) injection 25 mg, 25 mg, Intravenous, Q6H PRN, Jani Gravel, MD   enoxaparin (LOVENOX) injection 40 mg, 40 mg, Subcutaneous, Q24H, Nazari, Walid A, RPH, 40 mg at 04/01/22 0845   ferrous sulfate tablet 325 mg, 325 mg, Oral, Q breakfast, Earnestine Leys, MD, 325 mg at 04/01/22 0355   hydrALAZINE (APRESOLINE) injection 5 mg, 5 mg, Intravenous, Q6H PRN, Jani Gravel, MD   HYDROcodone-acetaminophen [facility]) 7.5-325 MG per tablet 1-2 tablet, 1-2 tablet, Oral, Q4H PRN, Earnestine Leys, MD, 1 tablet at 03/31/22 2109   insulin aspart (novoLOG) injection 0-9  Units, 0-9 Units, Subcutaneous, TID WC, Enzo Bi, MD, 3 Units at 04/01/22 0842   insulin glargine-yfgn [facility]) injection 10 Units, 10 Units, Subcutaneous, Daily, Enzo Bi, MD, 10 Units at 03/31/22 1338   magnesium hydroxide (MILK OF MAGNESIA) suspension 30 mL, 30 mL, Oral, Daily PRN, Earnestine Leys, MD, 30 mL at 03/31/22 0641   menthol-cetylpyridinium (CEPACOL) lozenge 3 mg, 1 lozenge, Oral, PRN **OR** phenol (CHLORASEPTIC) mouth spray 1 spray, 1 spray, Mouth/Throat, PRN, Earnestine Leys, MD   metoCLOPramide (REGLAN) tablet 5-10 mg, 5-10 mg, Oral, Q8H PRN **OR** metoCLOPramide (REGLAN) injection 5-10 mg, 5-10 mg, Intravenous, Q8H PRN, Earnestine Leys, MD   midodrine (PROAMATINE) tablet 5 mg, 5 mg, Oral, TID WC, Jani Gravel, MD, 5 mg at 04/01/22 6568   morphine (PF) 2 MG/ML injection 0.5-1 mg, 0.5-1 mg, Intravenous, Q2H PRN, Earnestine Leys, MD   morphine (PF) 2 MG/ML injection 1 mg, 1 mg, Intravenous, Q3H PRN, Jani Gravel, MD, 1 mg at 03/29/22 0838   ondansetron (ZOFRAN) injection 4 mg, 4 mg, Intravenous, Q6H PRN, Jani Gravel, MD   ondansetron (ZOFRAN-ODT) disintegrating tablet 4 mg, 4 mg, Oral, Q8H PRN, Enzo Bi, MD   rosuvastatin (CRESTOR) tablet 10 mg, 10 mg, Oral, QHS, Jani Gravel, MD, 10 mg at 03/31/22 2109   senna (SENOKOT) tablet 8.6 mg, 1 tablet, Oral, BID, Earnestine Leys, MD, 8.6 mg at 03/31/22 2109   senna-docusate (Senokot-S) tablet 1 tablet, 1 tablet, Oral, QHS PRN, Jani Gravel, MD   sertraline (ZOLOFT) tablet 100 mg, 100 mg, Oral, Daily, Enzo Bi, MD, 100 mg at 03/31/22 0831   sodium phosphate (FLEET) 7-19 GM/118ML enema 1 enema, 1 enema, Rectal, Once PRN, Earnestine Leys, MD   traMADol Veatrice Bourbon) tablet 50 mg, 50 mg, Oral, Q6H PRN, Jani Gravel, MD   zolpidem (AMBIEN) tablet 5 mg, 5 mg, Oral, QHS PRN, Earnestine Leys, MD  Patients Current Diet:  Diet Order             Diet Carb Modified Fluid consistency: Thin; Room service appropriate? Yes  Diet effective now                   Precautions / Restrictions Precautions Precautions: Fall Restrictions Weight Bearing Restrictions: No LLE Weight Bearing: Weight bearing as tolerated   Has the patient had 2 or more falls or a fall with injury in the past year? Yes  Prior Activity Level Limited Community (1-2x/wk): using a RW prior to admit, occasional driving (grocery, pharmacy, etc), mod I with ADLs  Prior Functional Level Self Care: Did the patient need help bathing, dressing, using the toilet or eating? Independent  Indoor Mobility: Did the patient need assistance with walking from room to room (with or without device)? Independent  Stairs: Did the patient need assistance with internal or external stairs (with or without device)? Independent  Functional Cognition: Did the patient need help planning regular tasks such as shopping or remembering to take  medications? Independent  Patient Information Are you of Hispanic, Latino/a,or Spanish origin?: A. No, not of Hispanic, Latino/a, or Spanish origin What is your race?: A. White Do you need or want an interpreter to communicate with a doctor or health care staff?: 0. No  Patient's Response To:  Health Literacy and Transportation Is the patient able to respond to health literacy and transportation needs?: No Health Literacy - How often do you need to have someone help you when you read instructions, pamphlets, or other written material from your doctor or pharmacy?: Patient declines  to respond In the past 12 months, has lack of transportation kept you from medical appointments or from getting medications?: No In the past 12 months, has lack of transportation kept you from meetings, work, or from getting things needed for daily living?: No  Development worker, international aid / Force Devices/Equipment: Eyeglasses, Environmental consultant (specify type), Other (Comment) (insulin pump) Home Equipment: Rolling Walker (2 wheels), Rollator (4 wheels), Cane - single point  Prior Device Use: Indicate devices/aids used by the patient prior to current illness, exacerbation or injury? Walker  Current Functional Level Cognition  Overall Cognitive Status: Within Functional Limits for tasks assessed Orientation Level: Oriented X4 Safety/Judgement: Decreased awareness of safety General Comments: Pt is A but lethargic. Was given pain meds ~ 1 hour prior. she is agreeable and cooperative throughout but severely limited by dizziness. BP was 117/72 in standing but needed to sit and return to supine 2/2 to feeling nasous/lightheaded and endorsing dizziness.    Extremity Assessment (includes Sensation/Coordination)  Upper Extremity Assessment: Defer to OT evaluation  Lower Extremity Assessment: Generalized weakness, LLE deficits/detail LLE: Unable to fully assess due to pain LLE Coordination: decreased gross motor     ADLs       Mobility  Overal bed mobility: Needs Assistance Bed Mobility: Supine to Sit Supine to sit: Mod assist Sit to supine: Min assist General bed mobility comments: mod assist + extensive abmount of time required to exiot R side of bed. Vcs for technique and sequencing. Only min assist required to returnt o supine from EOB short sit.    Transfers  Overall transfer level: Needs assistance Equipment used: Rolling walker (2 wheels) Transfers: Sit to/from Stand Sit to Stand: Min assist, Mod assist, From elevated surface Bed to/from chair/wheelchair/BSC transfer type:: Stand pivot Stand pivot transfers: Max assist General transfer comment: pt tolerated standing 1 x from elevated bed height with vcs + min-mod assist. she had already sat in recliner x several hours earlier in the day but 2/2 to c/o dizziness/nausea, elected to returnt o bed after standing EOB 1 x. reviewed ther ex and will have pt perform more in PM session    Ambulation / Gait / Stairs / Wheelchair Mobility  Ambulation/Gait General Gait Details: unable 2/2 to c/o severe dizziness upon standing. " I was put in the hospital ~ 6 months ago due to dizziness. encouraged increased fluid intake. Pt was vomiting ealier in the day.    Posture / Balance Dynamic Sitting Balance Sitting balance - Comments: leaning to right due to pain Balance Overall balance assessment: Needs assistance Sitting-balance support: Feet supported Sitting balance-Leahy Scale: Good Sitting balance - Comments: leaning to right due to pain Postural control: Right lateral lean Standing balance support: Bilateral upper extremity supported, During functional activity, Reliant on assistive device for balance Standing balance-Leahy Scale: Fair Standing balance comment: difficult to assess 2/2 to pt limited standing tolerance due to dizziness/nausea. BP was stable.    Special needs/care consideration Diabetic management yes (insulin pump)   Previous Home  Environment (from acute therapy documentation) Living Arrangements: Spouse/significant other Available Help at Discharge: Family, Available PRN/intermittently (patient lives with husband who has had CVA. Daughter assists as able.) Type of Home: House Home Layout: Two level, Able to live on main level with bedroom/bathroom Home Access: Ramped entrance [place]: No  Discharge Living Setting Plans for Discharge Living Setting: Patient's home, Lives with (comment) (spouse (bedbound) and daughter) Type of Home at Discharge: House Discharge Home Layout: Able to live on main  level with bedroom/bathroom Discharge Home Access: Ramped entrance Discharge Bathroom Shower/Tub: Walk-in shower Discharge Bathroom Toilet: Standard (BSC over toilet for height) Discharge Bathroom Accessibility: Yes How Accessible: Accessible via walker Does the patient have any problems obtaining your medications?: No  Social/Family/Support Systems Patient Roles: Spouse Anticipated Caregiver: daughter [name]) and hired caregivers Anticipated Ambulance person Information: [phone] Ability/Limitations of Caregiver: n/a Caregiver Availability: 24/7 Discharge Plan Discussed with Primary Caregiver: Yes Is Caregiver In Agreement with Plan?: Yes  Goals Patient/Family Goal for Rehab: PT/OT supervision to mod I, SLP n/a Expected length of stay: 12-14 days Additional Information: pt's spouse is bedbound, daughter takes care of them both and is increasing their hired caregiver hours to assist once pt comes home Pt/Family Agrees to Admission and willing to participate: Yes Program Orientation Provided & Reviewed with Pt/Caregiver Including Roles  & Responsibilities: Yes  Decrease burden of Care through IP rehab admission: n/a  Possible need for SNF placement upon discharge: not anticipated  Patient Condition: I have reviewed medical records from [facility], spoken with CM, and daughter. I discussed via phone for  inpatient rehabilitation assessment.  Patient will benefit from ongoing PT and OT, can actively participate in 3 hours of therapy a day 5 days of the week, and can make measurable gains during the admission.  Patient will also benefit from the coordinated team approach during an Inpatient Acute Rehabilitation admission.  The patient will receive intensive therapy as well as Rehabilitation physician, nursing, social worker, and care management interventions.  Due to safety, skin/wound care, disease management, medication administration, pain management, and patient education the patient requires 24 hour a day rehabilitation nursing.  The patient is currently min to mod assist with mobility and basic ADLs.  Discharge setting and therapy post discharge at home with home health is anticipated.  Patient has agreed to participate in the Acute Inpatient Rehabilitation Program and will admit {Time; today/tomorrow:10263}.  Preadmission Screen Completed By:  Michel Santee, PT, DPT 04/01/2022 10:13 AM ______________________________________________________________________   Discussed status with Dr. Marland Kitchen on *** at *** and received approval for admission today.  Admission Coordinator:  Michel Santee, PT, DPT time Marland KitchenSudie Grumbling ***   Assessment/Plan: Diagnosis: Does the need for close, 24 hr/day Medical supervision in concert with the patient's rehab needs make it unreasonable for this patient to be served in a less intensive setting? {yes_no_potentially:3041433} Co-Morbidities requiring supervision/potential complications: *** Due to {due HF:0263785}, does the patient require 24 hr/day rehab nursing? {yes_no_potentially:3041433} Does the patient require coordinated care of a physician, rehab nurse, PT, OT, and SLP to address physical and functional deficits in the context of the above medical diagnosis(es)? {yes_no_potentially:3041433} Addressing deficits in the following areas: {deficits:3041436} Can the patient  actively participate in an intensive therapy program of at least 3 hrs of therapy 5 days a week? {yes_no_potentially:3041433} The potential for patient to make measurable gains while on inpatient rehab is {potential:3041437} Anticipated functional outcomes upon discharge from inpatient rehab: {functional outcomes:304600100} PT, {functional outcomes:304600100} OT, {functional outcomes:304600100} SLP Estimated rehab length of stay to reach the above functional goals is: *** Anticipated discharge destination: {anticipated dc setting:21604} 10. Overall Rehab/Functional Prognosis: {potential:3041437}   MD Signature: ***

## 2022-04-01 NOTE — Inpatient Diabetes Management (Addendum)
Inpatient Diabetes Program Recommendations  AACE/ADA: New Consensus Statement on Inpatient Glycemic Control   Target Ranges:  Prepandial:   less than 140 mg/dL      Peak postprandial:   less than 180 mg/dL (1-2 hours)      Critically ill patients:  140 - 180 mg/dL    Latest Reference Range & Units 03/31/22 07:57 03/31/22 11:18 03/31/22 14:58 03/31/22 15:31 03/31/22 15:42 03/31/22 16:39 03/31/22 21:12  Glucose-Capillary 70 - 99 mg/dL 326 (H) 192 (H) 56 (L) 34 (LL) 232 (H) 131 (H) 118 (H)   Review of Glycemic Control  Admit with: Fall with L femoral neck fracture    History: Type 1 Diabetes (diagnosed at age 62) (Needs Basal, Correction, and Meal Coverage Insulins)   Home DM Meds: OmniPod 5 Insulin Pump with Humalog                             Dexcom CGM   Current Orders: Semglee 10 units daily                           Novolog 0-9 units TID with meals      Surgery 12/24  Recommendations:   Insulin Pump: If patient will resume her insulin pump today, please consider discontinuing Semglee and Novolog correction scale. Have patient resume her insulin pump and use Insulin Pump order set to order CBGs AC&HS and 2am and insulin pump AC&HS and 2 am.  ENDO: Dr. Honor Junes with [facility] Last Seen 03/16/2022--Was having trouble connecting her Dexcom to her insulin pump Pump Settings were as follows: Basal rates 12 AM = 0.3 9 AM = 0.5 12 PM = 0.45 6 PM = 0.4 9 PM = 0.2 TDD basal: 8.7 units  Bolus settings I/C: 13 ISF: 60 Target Glucose: 120 Active insulin time: 5 hours  Addendum 04/01/22'@14'$ :20-Spoke with patient at bedside. Patient is alert and oriented. Patient had not resumed insulin pump (reports she was waiting on nursing staff to let her know if she could). Informed patient that attending provider has given permission and ordered for her to resume using her insulin pump. Patient had new OmniPod pod, Humalog insulin, and PDM at bedside. Patient was able to fill the new pod with  insulin and applied a new OmniPod pod at 14:10 to right lower abdomen. Patient has Dexcom CGM applied to left abdomen. Patient is using auto mode and Dexcom CGM glucose was 233 mg/dl when pump restarted. Informed patient that our team will follow along while inpatient. Patient has no questions at this time. Notified LPN and Dr. Dimitri Ped via chat message that insulin pump was restarted at 14:10.  Thanks, Barnie Alderman, RN, MSN, [place] Diabetes Coordinator Inpatient Diabetes Program [phone] (Team Pager from 8am to [place])

## 2022-04-01 NOTE — Progress Notes (Addendum)
Physical Therapy Treatment Patient Details Name: Diana Frost MRN: [id number] DOB: 05-29-1945 Today's Date: 04/01/2022   History of Present Illness andra L Tuohy is a 76 y.o. female with medical history significant of hypertension, Dm2, CAD, Osteoporosis, presents with mechanical fall while in the kitchen, on wood floor.  Pt landed on her left hip, and c/o pain and called EMS.  Pt states that her legs got tangled. Patient is s/p L femur ORIF, TTWB    PT Comments    Pt was sitting in recliner upon arrival. She was on the phone ordering lunch and dinner. Once off phone, endorses more severe pain than previous date and requested to return to bed. She continues to require extensive assistance to safely stand to RW and struggles today to safely advance to taking steps while adhering to TTWB restrictions. RN tech arrived to check BS (BS 190s)  Overall pt tolerated session well but was severely limited by pain. Author will return later this afternoon and continue to follow per current POC.       Recommendations for follow up therapy are one component of a multi-disciplinary discharge planning process, led by the attending physician.  Recommendations may be updated based on patient status, additional functional criteria and insurance authorization.  Follow Up Recommendations  Acute inpatient rehab (3hours/day)     Assistance Recommended at Discharge Frequent or constant Supervision/Assistance  Patient can return home with the following A lot of help with walking and/or transfers;A lot of help with bathing/dressing/bathroom   Equipment Recommendations  Other (comment) (defer to next level of care)       Precautions / Restrictions Precautions Precautions: Fall Restrictions Weight Bearing Restrictions: Yes LLE Weight Bearing: Weight bearing as tolerated     Mobility  Bed Mobility Overal bed mobility: Needs Assistance Bed Mobility: Sit to Supine  Sit to supine: Mod assist  General  bed mobility comments: mod assist to safely reposition from short sit to supine    Transfers Overall transfer level: Needs assistance Equipment used: Rolling walker (2 wheels) Transfers: Sit to/from Stand Sit to Stand: Mod assist      General transfer comment: mod assist required to safely stand from recliner to RW with max vcs for handplacement, fwd wt shift, and overall technique improvements    Ambulation/Gait Ambulation/Gait assistance: Mod assist Gait Distance (Feet): 3 Feet Assistive device: Rolling walker (2 wheels) Gait Pattern/deviations: Step-to pattern, Antalgic Gait velocity: decreased     General Gait Details: pt struggles to perform gait and quickly fatigued. required bed being unlocked and moved closer since pt unable to safely-while adhereing to TTWB, make it to EOB.    Balance Overall balance assessment: Needs assistance Sitting-balance support: Feet supported Sitting balance-Leahy Scale: Good     Standing balance support: Bilateral upper extremity supported, During functional activity, Reliant on assistive device for balance Standing balance-Leahy Scale: Poor       Cognition Arousal/Alertness: Awake/alert Behavior During Therapy: WFL for tasks assessed/performed Overall Cognitive Status: Within Functional Limits for tasks assessed Area of Impairment: Safety/judgement, Awareness, Orientation, Problem solving      Orientation Level: Disoriented to, Situation       Safety/Judgement: Decreased awareness of safety, Decreased awareness of deficits     General Comments: Pt is A and ordering lunch and dinner upon arrival. She endorses severe pain and request to return to bed           General Comments General comments (skin integrity, edema, etc.): reviewed importance of OOB activity and need  to perform ther ex to promote strengthening. " Can we perform the exercises later? It hurts bad right now."      Pertinent Vitals/Pain Pain Assessment Pain  Assessment: 0-10 Pain Score: 8  Faces Pain Scale: Hurts little more Pain Location: L LE Pain Descriptors / Indicators: Discomfort, Grimacing, Guarding Pain Intervention(s): Limited activity within patient's tolerance, Monitored during session, Premedicated before session, Repositioned     PT Goals (current goals can now be found in the care plan section) Acute Rehab PT Goals Patient Stated Goal: rehab then home Progress towards PT goals: Not progressing toward goals - comment (pain limited)    Frequency    BID      PT Plan Current plan remains appropriate       AM-PAC PT "6 Clicks" Mobility   Outcome Measure  Help needed turning from your back to your side while in a flat bed without using bedrails?: A Lot Help needed moving from lying on your back to sitting on the side of a flat bed without using bedrails?: A Lot Help needed moving to and from a bed to a chair (including a wheelchair)?: A Lot Help needed standing up from a chair using your arms (e.g., wheelchair or bedside chair)?: A Lot Help needed to walk in hospital room?: A Lot Help needed climbing 3-5 steps with a railing? : Total 6 Click Score: 11    End of Session   Activity Tolerance: Patient limited by pain Patient left: in bed;with call bell/phone within reach;with bed alarm set;with nursing/sitter in room Nurse Communication: Mobility status PT Visit Diagnosis: Unsteadiness on feet (R26.81);Other abnormalities of gait and mobility (R26.89);Repeated falls (R29.6);Muscle weakness (generalized) (M62.81);Difficulty in walking, not elsewhere classified (R26.2);Pain Pain - Right/Left: Left Pain - part of body: Leg;Hip     Time: 7939-0300 PT Time Calculation (min) (ACUTE ONLY): 19 min  Charges:  $Therapeutic Activity: 8-22 mins                    Julaine Fusi PTA 04/01/22, 12:45 PM

## 2022-04-01 NOTE — Progress Notes (Signed)
PT Cancellation Note  Patient Details Name: Diana Frost MRN: [id number] DOB: [birth year]   Cancelled Treatment:     PT attempt. RN in room setting up insulin pump. Will return shortly and continue to follow per current POC. Plan is to DC to CIR at [place] 04/01/2022, 3:06 PM

## 2022-04-01 NOTE — Care Management Important Message (Signed)
Important Message  Patient Details  Name: Diana Frost MRN: [id number] Date of Birth: 06-16-1945   Medicare Important Message Given:  N/A - LOS <3 / Initial given by admissions     [name] A Terrel Manalo 04/01/2022, 7:22 AM

## 2022-04-01 NOTE — Progress Notes (Signed)
Inpatient Rehab Coordinator Note:  I spoke with pt's daughter, [name], over the phone to discuss CIR recommendations and goals/expectations of CIR stay.  We reviewed 3 hrs/day of therapy, physician follow up, and average length of stay 2 weeks (dependent upon progress) with goals of supervision to mod IMarcie Bal is home with pt and spouse and plans to increased hired caregiver support as needed once pt returns home.  We reviewed Medicare benefits and we will plan tentative admission once medically cleared and bed available. Will follow.   Shann Medal, PT, DPT Admissions Coordinator [phone] 04/01/22  10:13 AM

## 2022-04-01 NOTE — TOC Progression Note (Signed)
Transition of Care [facility]) - Progression Note    Patient Details  Name: KAELEA GATHRIGHT MRN: [id number] Date of Birth: 14-Jun-1945  Transition of [facility]) CM/SW [place], RN Phone Number: 04/01/2022, 10:46 AM  Clinical Narrative:    CIR will accept the patient and admit tomorrow        Expected Discharge Plan and Services                                               Social Determinants of Health (SDOH) Interventions SDOH Screenings   Food Insecurity: No Food Insecurity (03/29/2022)  Housing: Low Risk  (03/29/2022)  Transportation Needs: No Transportation Needs (03/29/2022)  Utilities: Not At Risk (03/29/2022)  Tobacco Use: Medium Risk (03/31/2022)    Readmission Risk Interventions     No data to display

## 2022-04-01 NOTE — Progress Notes (Signed)
Inpatient Rehab Admissions Coordinator:   Left message or pt's daughter, [name], to discuss CIR recommendations.  Will follow.   Shann Medal, PT, DPT Admissions Coordinator [phone] 04/01/22  9:55 AM

## 2022-04-01 NOTE — Progress Notes (Signed)
  Progress Note   Patient: Diana Frost JKD:[id number] DOB: 02-26-1946 DOA: 03/28/2022     4 DOS: the patient was seen and examined on 04/01/2022   Brief hospital course: 53, patient is a 76 year old female with medical history significant of hypertension, Dm2 on insulin pump, CAD, Osteoporosis, presented with mechanical fall while in the kitchen, on wood floor.  Pt landed on her left hip.  She sustained a left femoral neck fracture.  Status post ORIF on 03/29/2022  Assessment and Plan:  Left femoral neck fracture S/P PERCUTANEOUS FIXATION OF left FEMORAL NECK on 03/29/22 PT assessment appreciated.  Acute rehab recommended.  CIR will accept patient tomorrow.  Pain is adequately controlled  Diabetes mellitus type 2: Blood glucose has been erratic.  Daughter is insistent on patient being put back on her insulin pump.  Patient is alert oriented, hence insulin pump where he restarted.  Fingerstick glucose monitoring as.  Insulin pump protocol.  Acute on chronic anemia: Likely due to perioperative blood loss.  History of vitamin B12 deficiency: Patient was on p.o. replacement.  Daughter also of the opinion that p.o. does not work for patient.  We will repeat vitamin B12 level.   Hypokalemia :Resolved.  Monitor and replete PRN   Hx of Hypotension: Cont midodrine '5mg'$  po tid  Mild tachycardia: Etiology is unclear.  Could be multifactorial in etiology.  Will monitor for now.  If persistent, may consider further workup for PE   Hyperlipidemia, CAD Cont aspirin '81mg'$  po qday Cont Crestor '10mg'$  po qday   Acute on chronic debility secondary to left hip fracture.  Physical therapy continues to work with patient during the course of stay.   Subjective: Patient feels clinically better.  She is in a good mood.  Daughter is at bedside.  Daughter had concerns regarding restarting patient back on her insulin pump.  This will be started.  She also raised concerns regarding Vitamin B12 deficiency and  daughter requesting for vitamin B12 to be replaced parenterally rather than orally.  Physical Exam: Vitals:   03/31/22 0737 03/31/22 1629 04/01/22 0500 04/01/22 0725  BP: (!) 116/54 110/60 126/65 128/71  Pulse: (!) 104 91 96 98  Resp: '18 14 16 15  '$ Temp: 98.1 F (36.7 C) 98.6 F (37 C) 99 F (37.2 C) 98.1 F (36.7 C)  TempSrc:   Oral   SpO2: 97% 98% 96% 95%  Weight:      Height:       General: Pleasant elderly female.  Sitting up comfortably in chair at bedside.  Not in any acute distress. Head: HEENT unremarkable.  Oral mucosa moist. Neck supple: CNS: No obvious confusion at this time.  No cranial nerve deficits appreciated. Abdomen: Soft nontender. Left hip site looks clean and dry.  Dressing in place.  No bleeding noted.  Data Reviewed: Sodium 140, potassium 3.8, bicarb 28, glucose 126, BUN 18, creatinine 0.75.  Hemoglobin 8.9, hematocrit 27.5, platelet count 185  Family Communication: Daughter at bedside.  Discussed plan of care in detail.  Discussed with nurses regarding resuming insulin pump.  Disposition: Status is: Inpatient Remains inpatient appropriate because: Pending discharge in a.m. to CIR.  Planned Discharge Destination: Rehab VTE prophylaxis with Lovenox and SCDs.  Time spent: 32 minutes  Author: Artist Beach, MD 04/01/2022 1:26 PM  For on call review [email or web address].

## 2022-04-02 ENCOUNTER — Encounter (HOSPITAL_COMMUNITY): Payer: Self-pay | Admitting: Physical Medicine and Rehabilitation

## 2022-04-02 ENCOUNTER — Other Ambulatory Visit: Payer: Self-pay

## 2022-04-02 ENCOUNTER — Inpatient Hospital Stay (HOSPITAL_COMMUNITY)
Admission: RE | Admit: 2022-04-02 | Discharge: 2022-04-23 | DRG: 560 | Disposition: A | Discharge status: Home Health | Payer: Medicare Other | Source: Other Acute Inpatient Hospital | Attending: Physical Medicine and Rehabilitation | Admitting: Physical Medicine and Rehabilitation

## 2022-04-02 DIAGNOSIS — S72002A Fracture of unspecified part of neck of left femur, initial encounter for closed fracture: Secondary | ICD-10-CM | POA: Diagnosis not present

## 2022-04-02 DIAGNOSIS — Z9641 Presence of insulin pump (external) (internal): Secondary | ICD-10-CM | POA: Diagnosis not present

## 2022-04-02 DIAGNOSIS — I951 Orthostatic hypotension: Secondary | ICD-10-CM | POA: Diagnosis not present

## 2022-04-02 DIAGNOSIS — Z9841 Cataract extraction status, right eye: Secondary | ICD-10-CM | POA: Diagnosis not present

## 2022-04-02 DIAGNOSIS — K5901 Slow transit constipation: Secondary | ICD-10-CM | POA: Diagnosis not present

## 2022-04-02 DIAGNOSIS — S72002D Fracture of unspecified part of neck of left femur, subsequent encounter for closed fracture with routine healing: Secondary | ICD-10-CM | POA: Diagnosis not present

## 2022-04-02 DIAGNOSIS — R000 Tachycardia, unspecified: Secondary | ICD-10-CM | POA: Diagnosis not present

## 2022-04-02 DIAGNOSIS — R5383 Other fatigue: Secondary | ICD-10-CM | POA: Diagnosis not present

## 2022-04-02 DIAGNOSIS — Z885 Allergy status to narcotic agent status: Secondary | ICD-10-CM | POA: Diagnosis not present

## 2022-04-02 DIAGNOSIS — Z79899 Other long term (current) drug therapy: Secondary | ICD-10-CM

## 2022-04-02 DIAGNOSIS — Z7983 Long term (current) use of bisphosphonates: Secondary | ICD-10-CM

## 2022-04-02 DIAGNOSIS — S72012D Unspecified intracapsular fracture of left femur, subsequent encounter for closed fracture with routine healing: Secondary | ICD-10-CM | POA: Diagnosis not present

## 2022-04-02 DIAGNOSIS — E876 Hypokalemia: Secondary | ICD-10-CM | POA: Diagnosis not present

## 2022-04-02 DIAGNOSIS — F32A Depression, unspecified: Secondary | ICD-10-CM | POA: Diagnosis present

## 2022-04-02 DIAGNOSIS — F419 Anxiety disorder, unspecified: Secondary | ICD-10-CM | POA: Diagnosis not present

## 2022-04-02 DIAGNOSIS — S7292XA Unspecified fracture of left femur, initial encounter for closed fracture: Secondary | ICD-10-CM | POA: Diagnosis present

## 2022-04-02 DIAGNOSIS — W19XXXD Unspecified fall, subsequent encounter: Secondary | ICD-10-CM | POA: Diagnosis present

## 2022-04-02 DIAGNOSIS — Y842 Radiological procedure and radiotherapy as the cause of abnormal reaction of the patient, or of later complication, without mention of misadventure at the time of the procedure: Secondary | ICD-10-CM | POA: Diagnosis not present

## 2022-04-02 DIAGNOSIS — I252 Old myocardial infarction: Secondary | ICD-10-CM | POA: Diagnosis not present

## 2022-04-02 DIAGNOSIS — Z9842 Cataract extraction status, left eye: Secondary | ICD-10-CM | POA: Diagnosis not present

## 2022-04-02 DIAGNOSIS — M79662 Pain in left lower leg: Secondary | ICD-10-CM | POA: Diagnosis present

## 2022-04-02 DIAGNOSIS — D649 Anemia, unspecified: Secondary | ICD-10-CM | POA: Diagnosis not present

## 2022-04-02 DIAGNOSIS — Z882 Allergy status to sulfonamides status: Secondary | ICD-10-CM

## 2022-04-02 DIAGNOSIS — Z955 Presence of coronary angioplasty implant and graft: Secondary | ICD-10-CM

## 2022-04-02 DIAGNOSIS — Z961 Presence of intraocular lens: Secondary | ICD-10-CM | POA: Diagnosis not present

## 2022-04-02 DIAGNOSIS — Z888 Allergy status to other drugs, medicaments and biological substances status: Secondary | ICD-10-CM | POA: Diagnosis not present

## 2022-04-02 DIAGNOSIS — E1042 Type 1 diabetes mellitus with diabetic polyneuropathy: Secondary | ICD-10-CM

## 2022-04-02 DIAGNOSIS — Z87891 Personal history of nicotine dependence: Secondary | ICD-10-CM

## 2022-04-02 DIAGNOSIS — I10 Essential (primary) hypertension: Secondary | ICD-10-CM | POA: Diagnosis present

## 2022-04-02 DIAGNOSIS — R296 Repeated falls: Secondary | ICD-10-CM | POA: Diagnosis present

## 2022-04-02 DIAGNOSIS — R52 Pain, unspecified: Secondary | ICD-10-CM | POA: Diagnosis not present

## 2022-04-02 DIAGNOSIS — S728X2S Other fracture of left femur, sequela: Secondary | ICD-10-CM | POA: Diagnosis not present

## 2022-04-02 DIAGNOSIS — R110 Nausea: Secondary | ICD-10-CM | POA: Diagnosis present

## 2022-04-02 DIAGNOSIS — M7989 Other specified soft tissue disorders: Secondary | ICD-10-CM | POA: Diagnosis not present

## 2022-04-02 DIAGNOSIS — I9589 Other hypotension: Secondary | ICD-10-CM | POA: Diagnosis present

## 2022-04-02 DIAGNOSIS — Z90710 Acquired absence of both cervix and uterus: Secondary | ICD-10-CM

## 2022-04-02 DIAGNOSIS — E1059 Type 1 diabetes mellitus with other circulatory complications: Secondary | ICD-10-CM | POA: Diagnosis not present

## 2022-04-02 DIAGNOSIS — I959 Hypotension, unspecified: Secondary | ICD-10-CM | POA: Diagnosis not present

## 2022-04-02 DIAGNOSIS — D62 Acute posthemorrhagic anemia: Secondary | ICD-10-CM | POA: Diagnosis not present

## 2022-04-02 DIAGNOSIS — K5900 Constipation, unspecified: Secondary | ICD-10-CM | POA: Diagnosis not present

## 2022-04-02 DIAGNOSIS — E109 Type 1 diabetes mellitus without complications: Secondary | ICD-10-CM | POA: Diagnosis present

## 2022-04-02 DIAGNOSIS — I2510 Atherosclerotic heart disease of native coronary artery without angina pectoris: Secondary | ICD-10-CM | POA: Diagnosis present

## 2022-04-02 DIAGNOSIS — R262 Difficulty in walking, not elsewhere classified: Secondary | ICD-10-CM | POA: Diagnosis present

## 2022-04-02 DIAGNOSIS — Z7982 Long term (current) use of aspirin: Secondary | ICD-10-CM

## 2022-04-02 DIAGNOSIS — E10649 Type 1 diabetes mellitus with hypoglycemia without coma: Secondary | ICD-10-CM | POA: Diagnosis not present

## 2022-04-02 LAB — CBC WITH DIFFERENTIAL/PLATELET
Abs Immature Granulocytes: 0.01 10*3/uL (ref 0.00–0.07)
Basophils Absolute: 0 10*3/uL (ref 0.0–0.1)
Basophils Relative: 1 %
Eosinophils Absolute: 0.2 10*3/uL (ref 0.0–0.5)
Eosinophils Relative: 4 %
HCT: 27.6 % — ABNORMAL LOW (ref 36.0–46.0)
Hemoglobin: 9 g/dL — ABNORMAL LOW (ref 12.0–15.0)
Immature Granulocytes: 0 %
Lymphocytes Relative: 21 %
Lymphs Abs: 0.9 10*3/uL (ref 0.7–4.0)
MCH: 31.4 pg (ref 26.0–34.0)
MCHC: 32.6 g/dL (ref 30.0–36.0)
MCV: 96.2 fL (ref 80.0–100.0)
Monocytes Absolute: 0.4 10*3/uL (ref 0.1–1.0)
Monocytes Relative: 10 %
Neutro Abs: 3 10*3/uL (ref 1.7–7.7)
Neutrophils Relative %: 64 %
Platelets: 199 10*3/uL (ref 150–400)
RBC: 2.87 MIL/uL — ABNORMAL LOW (ref 3.87–5.11)
RDW: 13.9 % (ref 11.5–15.5)
WBC: 4.5 10*3/uL (ref 4.0–10.5)
nRBC: 0 % (ref 0.0–0.2)

## 2022-04-02 LAB — BASIC METABOLIC PANEL
Anion gap: 9 (ref 5–15)
BUN: 19 mg/dL (ref 8–23)
CO2: 27 mmol/L (ref 22–32)
Calcium: 8.7 mg/dL — ABNORMAL LOW (ref 8.9–10.3)
Chloride: 105 mmol/L (ref 98–111)
Creatinine, Ser: 0.71 mg/dL (ref 0.44–1.00)
GFR, Estimated: >60 mL/min (ref >60)
Glucose, Bld: 154 mg/dL — ABNORMAL HIGH (ref 70–99)
Potassium: 3.4 mmol/L — ABNORMAL LOW (ref 3.5–5.1)
Sodium: 141 mmol/L (ref 135–145)

## 2022-04-02 LAB — VITAMIN B12: Vitamin B-12: 325 pg/mL (ref 180–914)

## 2022-04-02 LAB — GLUCOSE, CAPILLARY
Glucose-Capillary: 141 mg/dL — ABNORMAL HIGH (ref 70–99)
Glucose-Capillary: 157 mg/dL — ABNORMAL HIGH (ref 70–99)
Glucose-Capillary: 165 mg/dL — ABNORMAL HIGH (ref 70–99)
Glucose-Capillary: 154 mg/dL — ABNORMAL HIGH (ref 70–99)
Glucose-Capillary: 200 mg/dL — ABNORMAL HIGH (ref 70–99)

## 2022-04-02 LAB — MAGNESIUM: Magnesium: 1.9 mg/dL (ref 1.7–2.4)

## 2022-04-02 MED ORDER — MIDODRINE HCL 5 MG PO TABS
5.0000 mg | ORAL_TABLET | Freq: Three times a day (TID) | ORAL | Status: DC
  Administered 2022-04-02 – 2022-04-16 (×41): 5 mg via ORAL
  Filled 2022-04-02 (×41): qty 1

## 2022-04-02 MED ORDER — FERROUS SULFATE 325 (65 FE) MG PO TABS: 325.0000 mg | ORAL_TABLET | Freq: Every day | ORAL | 3 refills | Status: AC

## 2022-04-02 MED ORDER — INSULIN ASPART 100 UNIT/ML IJ SOLN
1000.0000 [IU] | Freq: Once | INTRAMUSCULAR | Status: DC
  Filled 2022-04-02: qty 10

## 2022-04-02 MED ORDER — BUPROPION HCL ER (XL) 150 MG PO TB24
150.0000 mg | ORAL_TABLET | Freq: Every morning | ORAL | Status: DC
  Administered 2022-04-03 – 2022-04-23 (×21): 150 mg via ORAL
  Filled 2022-04-02 (×21): qty 1

## 2022-04-02 MED ORDER — PROCHLORPERAZINE MALEATE 5 MG PO TABS
5.0000 mg | ORAL_TABLET | Freq: Four times a day (QID) | ORAL | Status: DC | PRN
  Administered 2022-04-03 – 2022-04-16 (×6): 10 mg via ORAL
  Filled 2022-04-02 (×6): qty 2

## 2022-04-02 MED ORDER — MAGNESIUM HYDROXIDE 400 MG/5ML PO SUSP
30.0000 mL | Freq: Every day | ORAL | Status: DC | PRN
  Administered 2022-04-19 – 2022-04-20 (×2): 30 mL via ORAL
  Filled 2022-04-02 (×3): qty 30

## 2022-04-02 MED ORDER — INSULIN PUMP: SUBCUTANEOUS | Status: AC

## 2022-04-02 MED ORDER — MELATONIN 5 MG PO TABS
5.0000 mg | ORAL_TABLET | Freq: Every evening | ORAL | Status: DC | PRN
  Administered 2022-04-09: 5 mg via ORAL
  Filled 2022-04-02: qty 1

## 2022-04-02 MED ORDER — ACETAMINOPHEN 325 MG PO TABS
325.0000 mg | ORAL_TABLET | ORAL | Status: DC | PRN
  Administered 2022-04-09: 650 mg via ORAL
  Filled 2022-04-02 (×3): qty 2

## 2022-04-02 MED ORDER — FERROUS SULFATE 325 (65 FE) MG PO TABS
325.0000 mg | ORAL_TABLET | Freq: Every day | ORAL | Status: DC
  Administered 2022-04-03: 325 mg via ORAL
  Filled 2022-04-02: qty 1

## 2022-04-02 MED ORDER — DIPHENHYDRAMINE HCL 12.5 MG/5ML PO ELIX: 12.5000 mg | ORAL_SOLUTION | Freq: Four times a day (QID) | ORAL | Status: DC | PRN

## 2022-04-02 MED ORDER — ASPIRIN 81 MG PO TBEC
81.0000 mg | DELAYED_RELEASE_TABLET | Freq: Every day | ORAL | Status: DC
  Administered 2022-04-03 – 2022-04-23 (×21): 81 mg via ORAL
  Filled 2022-04-02 (×21): qty 1

## 2022-04-02 MED ORDER — SERTRALINE HCL 100 MG PO TABS
100.0000 mg | ORAL_TABLET | Freq: Every day | ORAL | Status: DC
  Administered 2022-04-03 – 2022-04-23 (×21): 100 mg via ORAL
  Filled 2022-04-02 (×21): qty 1

## 2022-04-02 MED ORDER — FLEET ENEMA 7-19 GM/118ML RE ENEM: 1.0000 | ENEMA | Freq: Once | RECTAL | Status: DC | PRN

## 2022-04-02 MED ORDER — ROSUVASTATIN CALCIUM 5 MG PO TABS
10.0000 mg | ORAL_TABLET | Freq: Every day | ORAL | Status: DC
  Administered 2022-04-02 – 2022-04-22 (×21): 10 mg via ORAL
  Filled 2022-04-02 (×21): qty 2

## 2022-04-02 MED ORDER — MENTHOL 3 MG MT LOZG: 1.0000 | LOZENGE | OROMUCOSAL | Status: DC | PRN

## 2022-04-02 MED ORDER — GUAIFENESIN-DM 100-10 MG/5ML PO SYRP: 5.0000 mL | ORAL_SOLUTION | Freq: Four times a day (QID) | ORAL | Status: DC | PRN

## 2022-04-02 MED ORDER — PHENOL 1.4 % MT LIQD
1.0000 | OROMUCOSAL | Status: DC | PRN
  Administered 2022-04-19: 1 via OROMUCOSAL

## 2022-04-02 MED ORDER — ENOXAPARIN SODIUM 40 MG/0.4ML IJ SOSY
40.0000 mg | PREFILLED_SYRINGE | INTRAMUSCULAR | Status: DC
  Administered 2022-04-03 – 2022-04-23 (×21): 40 mg via SUBCUTANEOUS
  Filled 2022-04-02 (×21): qty 0.4

## 2022-04-02 MED ORDER — HYDROCODONE-ACETAMINOPHEN 7.5-325 MG PO TABS
1.0000 | ORAL_TABLET | ORAL | Status: DC | PRN
  Administered 2022-04-02 – 2022-04-03 (×2): 2 via ORAL
  Filled 2022-04-02 (×2): qty 2

## 2022-04-02 MED ORDER — SENNOSIDES-DOCUSATE SODIUM 8.6-50 MG PO TABS: 1.0000 | ORAL_TABLET | Freq: Every evening | ORAL | Status: DC | PRN

## 2022-04-02 MED ORDER — SENNOSIDES-DOCUSATE SODIUM 8.6-50 MG PO TABS
2.0000 | ORAL_TABLET | Freq: Every day | ORAL | Status: DC
  Administered 2022-04-02: 2 via ORAL
  Filled 2022-04-02: qty 2

## 2022-04-02 MED ORDER — POTASSIUM CHLORIDE CRYS ER 20 MEQ PO TBCR
40.0000 meq | EXTENDED_RELEASE_TABLET | Freq: Once | ORAL | Status: AC
  Administered 2022-04-02: 40 meq via ORAL
  Filled 2022-04-02: qty 2

## 2022-04-02 MED ORDER — INSULIN PUMP
Freq: Three times a day (TID) | SUBCUTANEOUS | Status: DC
  Administered 2022-04-02: 1.1 via SUBCUTANEOUS
  Administered 2022-04-06: 5.2 via SUBCUTANEOUS
  Administered 2022-04-08: 1.4 via SUBCUTANEOUS
  Administered 2022-04-08: 2.65 via SUBCUTANEOUS
  Filled 2022-04-02: qty 1

## 2022-04-02 MED ORDER — PROCHLORPERAZINE 25 MG RE SUPP: 12.5000 mg | Freq: Four times a day (QID) | RECTAL | Status: DC | PRN

## 2022-04-02 MED ORDER — POLYETHYLENE GLYCOL 3350 17 G PO PACK
17.0000 g | PACK | Freq: Every day | ORAL | Status: DC | PRN
  Administered 2022-04-12 – 2022-04-18 (×5): 17 g via ORAL
  Filled 2022-04-02 (×8): qty 1

## 2022-04-02 MED ORDER — PROCHLORPERAZINE EDISYLATE 10 MG/2ML IJ SOLN
5.0000 mg | Freq: Four times a day (QID) | INTRAMUSCULAR | Status: DC | PRN
  Filled 2022-04-02: qty 2

## 2022-04-02 MED ORDER — ALUM & MAG HYDROXIDE-SIMETH 200-200-20 MG/5ML PO SUSP: 30.0000 mL | ORAL | Status: DC | PRN

## 2022-04-02 NOTE — TOC Progression Note (Signed)
Transition of Care [facility]) - Progression Note    Patient Details  Name: Diana Frost MRN: [id number] Date of Birth: 1945-08-09  Transition [facility]) CM/SW [place], RN Phone Number: 04/02/2022, 9:54 AM  Clinical Narrative:    The patient will DC to CIR today via Research [employer] (medical)        Expected Discharge Plan and Services                                               Social Determinants of Health (SDOH) Interventions SDOH Screenings   Food Insecurity: No Food Insecurity (03/29/2022)  Housing: Low Risk  (03/29/2022)  Transportation Needs: No Transportation Needs (03/29/2022)  Utilities: Not At Risk (03/29/2022)  Tobacco Use: Medium Risk (03/31/2022)    Readmission Risk Interventions     No data to display

## 2022-04-02 NOTE — Progress Notes (Addendum)
PMR Admission Coordinator Pre-Admission Assessment   Patient: Diana Frost is an 76 y.o., female MRN: [id number] DOB: [birth year] Height: '5\' 2"'$  (157.5 cm) Weight: 53.8 kg   Insurance Information HMO:     PPO:      PCP:      IPA:      80/20:      OTHER:  PRIMARY: Medicare A/B      Policy#: [id number]       Subscriber: pt CM Name:       Phone#:      Fax#:  Pre-Cert#: verified Civil engineer, contracting:  Benefits:  Phone #:      Name:  Eff. Date: 03/06/10  A and B    Deduct: $1556      Out of Pocket Max: n/a      Life Max: n/a CIR: 100%      SNF: 20 full days Outpatient: 80%     Co-Pay: 20% Home Health: 100%      Co-Pay:  DME: 80%     Co-Pay: 20% Providers:  SECONDARYJacqlyn Krauss      Policy#: [id number]      Phone#: [phone]   Financial Counselor:       Phone#:    The "Data Collection Information Summary" for patients in Inpatient Rehabilitation Facilities with attached "Privacy Act [place] Records" was provided and verbally reviewed with: Patient and Family   Emergency Contact Information Contact Information       Name Relation Home Work Mobile    Royal,Janet Daughter     [phone]           Current Medical History  Patient Admitting Diagnosis: Hip fracture   History of Present Illness: Pt is a 76 y/o female with PMH of HTN, DM2, CAD, and osteoporosis who presents to [facility] on 12/23 following a mechanical fall in her kitchen.  In ED BP 151/80, HR 88, O2 98% on room air.  Labs unremarkable.  Xray showed subcapital left femoral neck fracture.  Orthopedics was consulted and recommended percutaneous pinning, which she underwent per DR. Miller on 12/24.  Post op pt to be TDWB.   Hospital course pain management.  Insulin pump restarted on 12/27. Therapy evaluations completed and pt was recommended for CIR.    Patient's medical record from [facility] has been reviewed by the rehabilitation admission coordinator and physician.   Past Medical History      Past Medical History:   Diagnosis Date   Coronary artery disease     Diabetes mellitus without complication (HCC)      insulin pump   Headache      since ear problem started   Hypertension     Myocardial infarction ([place]) 11/01/13      Has the patient had major surgery during 100 days prior to admission? Yes   Family History   family history includes Breast cancer in her maternal aunt; Lung cancer in her father.   Current Medications   Current Facility-Administered Medications:    acetaminophen (TYLENOL) tablet 325-650 mg, 325-650 mg, Oral, Q6H PRN, Wynelle Cleveland, RPH   alum & mag hydroxide-simeth (MAALOX/MYLANTA) 200-200-20 MG/5ML suspension 30 mL, 30 mL, Oral, Q4H PRN, Earnestine Leys, MD   aspirin EC tablet 81 mg, 81 mg, Oral, Daily, Jani Gravel, MD, 81 mg at 04/01/22 1030   bisacodyl (DULCOLAX) suppository 10 mg, 10 mg, Rectal, Daily PRN, Earnestine Leys, MD   buPROPion (WELLBUTRIN XL) 24 hr tablet 150 mg, 150 mg,  Oral, q morning, Enzo Bi, MD, 150 mg at 04/01/22 1030   chlorhexidine (HIBICLENS) 4 % liquid, , Topical, Once, Jani Gravel, MD   diphenhydrAMINE (BENADRYL) injection 25 mg, 25 mg, Intravenous, Q6H PRN, Jani Gravel, MD   enoxaparin (LOVENOX) injection 40 mg, 40 mg, Subcutaneous, Q24H, Brantley Stage, Walid A, RPH, 40 mg at 04/02/22 0846   ferrous sulfate tablet 325 mg, 325 mg, Oral, Q breakfast, Earnestine Leys, MD, 325 mg at 04/02/22 0845   hydrALAZINE (APRESOLINE) injection 5 mg, 5 mg, Intravenous, Q6H PRN, Jani Gravel, MD   HYDROcodone-acetaminophen [facility]) 7.5-325 MG per tablet 1-2 tablet, 1-2 tablet, Oral, Q4H PRN, Earnestine Leys, MD, 2 tablet at 04/01/22 1948   insulin aspart (novoLOG) injection 1,000 Units, 1,000 Units, Subcutaneous, Once, Alison Murray, RPH   insulin pump, , Subcutaneous, TID WC, HS, 0200, [place], Warnell Bureau, MD, 1.45 each at 04/01/22 1721   magnesium hydroxide (MILK OF MAGNESIA) suspension 30 mL, 30 mL, Oral, Daily PRN, Earnestine Leys, MD, 30 mL at 03/31/22 0641    menthol-cetylpyridinium (CEPACOL) lozenge 3 mg, 1 lozenge, Oral, PRN **OR** phenol (CHLORASEPTIC) mouth spray 1 spray, 1 spray, Mouth/Throat, PRN, Earnestine Leys, MD   metoCLOPramide (REGLAN) tablet 5-10 mg, 5-10 mg, Oral, Q8H PRN **OR** metoCLOPramide (REGLAN) injection 5-10 mg, 5-10 mg, Intravenous, Q8H PRN, Earnestine Leys, MD   midodrine (PROAMATINE) tablet 5 mg, 5 mg, Oral, TID WC, Jani Gravel, MD, 5 mg at 04/02/22 0846   morphine (PF) 2 MG/ML injection 0.5-1 mg, 0.5-1 mg, Intravenous, Q2H PRN, Earnestine Leys, MD   morphine (PF) 2 MG/ML injection 1 mg, 1 mg, Intravenous, Q3H PRN, Jani Gravel, MD, 1 mg at 03/29/22 0838   ondansetron (ZOFRAN) injection 4 mg, 4 mg, Intravenous, Q6H PRN, Jani Gravel, MD   ondansetron (ZOFRAN-ODT) disintegrating tablet 4 mg, 4 mg, Oral, Q8H PRN, Enzo Bi, MD   rosuvastatin (CRESTOR) tablet 10 mg, 10 mg, Oral, QHS, Jani Gravel, MD, 10 mg at 04/01/22 2219   senna (SENOKOT) tablet 8.6 mg, 1 tablet, Oral, BID, Earnestine Leys, MD, 8.6 mg at 04/01/22 2219   senna-docusate (Senokot-S) tablet 1 tablet, 1 tablet, Oral, QHS PRN, Jani Gravel, MD   sertraline (ZOLOFT) tablet 100 mg, 100 mg, Oral, Daily, Enzo Bi, MD, 100 mg at 04/01/22 1029   sodium phosphate (FLEET) 7-19 GM/118ML enema 1 enema, 1 enema, Rectal, Once PRN, Earnestine Leys, MD   traMADol Veatrice Bourbon) tablet 50 mg, 50 mg, Oral, Q6H PRN, Jani Gravel, MD   zolpidem (AMBIEN) tablet 5 mg, 5 mg, Oral, QHS PRN, Earnestine Leys, MD   Patients Current Diet:  Diet Order                  Diet Carb Modified Fluid consistency: Thin; Room service appropriate? Yes  Diet effective now                         Precautions / Restrictions Precautions Precautions: Fall Restrictions Weight Bearing Restrictions: Yes LLE Weight Bearing: Touchdown weight bearing    Has the patient had 2 or more falls or a fall with injury in the past year? Yes   Prior Activity Level Limited Community (1-2x/wk): using a RW prior to admit,  occasional driving (grocery, pharmacy, etc), mod I with ADLs   Prior Functional Level Self Care: Did the patient need help bathing, dressing, using the toilet or eating? Independent   Indoor Mobility: Did the patient need assistance with walking from room to room (with or without  device)? Independent   Stairs: Did the patient need assistance with internal or external stairs (with or without device)? Independent   Functional Cognition: Did the patient need help planning regular tasks such as shopping or remembering to take medications? Independent   Patient Information Are you of Hispanic, Latino/a,or Spanish origin?: A. No, not of Hispanic, Latino/a, or Spanish origin (by proxy) What is your race?: A. White (by proxy) Do you need or want an interpreter to communicate with a doctor or health care staff?: 0. No (by proxy)   Patient's Response To:  Health Literacy and Transportation Is the patient able to respond to health literacy and transportation needs?: Yes Health Literacy - How often do you need to have someone help you when you read instructions, pamphlets, or other written material from your doctor or pharmacy?: Never (by proxy) In the past 12 months, has lack of transportation kept you from medical appointments or from getting medications?: No (by proxy) In the past 12 months, has lack of transportation kept you from meetings, work, or from getting things needed for daily living?: No (by proxy)   Development worker, international aid / [place] Devices/Equipment: Eyeglasses, Environmental consultant (specify type), Other (Comment) (insulin pump) Home Equipment: [place] (2 wheels), Rollator (4 wheels), Cane - single point   Prior Device Use: Indicate devices/aids used by the patient prior to current illness, exacerbation or injury? Walker   Current Functional Level Cognition   Overall Cognitive Status: Within Functional Limits for tasks assessed Orientation Level: Disoriented to  time Safety/Judgement: Decreased awareness of safety, Decreased awareness of deficits General Comments: Pt is A and O x 3. Agrees to session and cooperative throughout    Extremity Assessment (includes Sensation/Coordination)   Upper Extremity Assessment: Defer to OT evaluation  Lower Extremity Assessment: Generalized weakness, LLE deficits/detail LLE: Unable to fully assess due to pain LLE Coordination: decreased gross motor     ADLs         Mobility   Overal bed mobility: Needs Assistance Bed Mobility: Supine to Sit Supine to sit: Mod assist, Max assist, HOB elevated Sit to supine: Mod assist General bed mobility comments: Increased time and assistance to safely achieve EOB short sit. Pain limited     Transfers   Overall transfer level: Needs assistance Equipment used: Rolling walker (2 wheels) Transfers: Bed to chair/wheelchair/BSC, Sit to/from Stand Sit to Stand: Mod assist, From elevated surface Bed to/from chair/wheelchair/BSC transfer type:: Stand pivot Stand pivot transfers: Max assist General transfer comment: Pt was able to stand 2 x EOB(elevated) prior to stand pivot towards R to recliner. She does well adhereing to proper TTWB LLE but struggles to pivot on RLE. INcreased time for all mobility and transfers due to pain.     Ambulation / Gait / Stairs / Wheelchair Mobility   Ambulation/Gait Ambulation/Gait assistance: Mod assist Gait Distance (Feet): 3 Feet Assistive device: Rolling walker (2 wheels) Gait Pattern/deviations: Step-to pattern, Antalgic General Gait Details: unable due to inability to perform TTWB LLE while advancing RLE Gait velocity: decreased     Posture / Balance Dynamic Sitting Balance Sitting balance - Comments: leaning to right due to pain Balance Overall balance assessment: Needs assistance Sitting-balance support: Feet supported Sitting balance-Leahy Scale: Good Sitting balance - Comments: leaning to right due to pain Postural control:  Right lateral lean Standing balance support: Bilateral upper extremity supported, During functional activity, Reliant on assistive device for balance Standing balance-Leahy Scale: Fair Standing balance comment: reliant on RW. limited by wt bearing  Special needs/care consideration Diabetic management yes (insulin pump)    Previous Home Environment (from acute therapy documentation) Living Arrangements: Spouse/significant other Available Help at Discharge: Family, Available PRN/intermittently (patient lives with husband who has had CVA. Daughter assists as able.) Type of Home: House Home Layout: Two level, Able to live on main level with bedroom/bathroom Home Access: Ramped entrance [place]: No   Discharge Living Setting Plans for Discharge Living Setting: Patient's home, Lives with (comment) (spouse (bedbound) and daughter) Type of Home at Discharge: House Discharge Home Layout: Able to live on main level with bedroom/bathroom Discharge Home Access: [place] entrance Discharge Bathroom Shower/Tub: Walk-in shower Discharge Bathroom Toilet: Standard ([place] over toilet for height) Discharge Bathroom Accessibility: Yes How Accessible: Accessible via walker Does the patient have any problems obtaining your medications?: No   Social/Family/Support Systems Patient Roles: Spouse Anticipated Caregiver: daughter [name]) and hired caregivers Anticipated Ambulance person Information: [phone] Ability/Limitations of Caregiver: n/a Caregiver Availability: 24/7 Discharge Plan Discussed with Primary Caregiver: Yes Is Caregiver In Agreement with Plan?: Yes   Goals Patient/Family Goal for Rehab: PT/OT supervision to mod I, SLP n/a Expected length of stay: 12-14 days Additional Information: pt's spouse is bedbound, daughter takes care of them both and is increasing their hired caregiver hours to assist once pt comes home Pt/Family Agrees to Admission and willing to participate:  Yes Program Orientation Provided & Reviewed with Pt/Caregiver Including Roles  & Responsibilities: Yes   Decrease burden of Care through IP rehab admission: n/a   Possible need for SNF placement upon discharge: not anticipated   Patient Condition: I have reviewed medical records from [facility], spoken with CM, and daughter. I discussed via phone for inpatient rehabilitation assessment.  Patient will benefit from ongoing PT and OT, can actively participate in 3 hours of therapy a day 5 days of the week, and can make measurable gains during the admission.  Patient will also benefit from the coordinated team approach during an Inpatient Acute Rehabilitation admission.  The patient will receive intensive therapy as well as Rehabilitation physician, nursing, social worker, and care management interventions.  Due to safety, skin/wound care, disease management, medication administration, pain management, and patient education the patient requires 24 hour a day rehabilitation nursing.  The patient is currently min to mod assist with mobility and basic ADLs.  Discharge setting and therapy post discharge at home with home health is anticipated.  Patient has agreed to participate in the Acute Inpatient Rehabilitation Program and will admit today.   Preadmission Screen Completed By:  Michel Santee, PT, DPT 04/02/2022 10:00 AM ______________________________________________________________________   Discussed status with Dr. Curlene Dolphin on 04/02/22  at 10:00 AM  and received approval for admission today.   Admission Coordinator:  Michel Santee, PT, DPT time 10:00 AM Sudie Grumbling 04/02/22     Assessment/Plan: Diagnosis: Left femoral neck fx displaced Does the need for close, 24 hr/day Medical supervision in concert with the patient's rehab needs make it unreasonable for this patient to be served in a less intensive setting? Yes Co-Morbidities requiring supervision/potential complications: Hypokalemia, DM type 1, CAD,  anemia,  Due to bladder management, bowel management, safety, skin/wound care, disease management, medication administration, pain management, and patient education, does the patient require 24 hr/day rehab nursing? Yes Does the patient require coordinated care of a physician, rehab nurse, PT, OT, and SLP to address physical and functional deficits in the context of the above medical diagnosis(es)? Yes Addressing deficits in the following areas: balance, endurance, locomotion, strength,  transferring, bowel/bladder control, bathing, dressing, feeding, grooming, toileting, and psychosocial support Can the patient actively participate in an intensive therapy program of at least 3 hrs of therapy 5 days a week? Yes The potential for patient to make measurable gains while on inpatient rehab is excellent Anticipated functional outcomes upon discharge from inpatient rehab: modified independent and supervision PT, modified independent and supervision OT, n/a SLP Estimated rehab length of stay to reach the above functional goals is: 12-14 Anticipated discharge destination: Home 10. Overall Rehab/Functional Prognosis: excellent     MD Signature: Jennye Boroughs

## 2022-04-02 NOTE — H&P (Addendum)
Physical Medicine and Rehabilitation Admission H&P    Chief Complaint  Patient presents with   Functional deficits    HPI:  Diana Frost is a 76 year old female with history of T1DM on insulin pump, CAD, HTN, multiple falls w/gait disorder;  who was admitted to [facility] on 03/28/22 after a mechanical fall with onset of left hip pain and inability to walk. She was found to have left non-displaced subcapital hip fracture and underwent percutaneous fixation of femoral neck by Dr. Earnestine Leys on 03/29/22. Post op to be TTWB and on Lovenox for DVT prophylaxis. She did have drop in K to 3.4 today which was supplemented and ABLA being monitored. Post op with pain, reports of confusion, difficulty with WB restrictions as well as weakness and fatigue. She reports he pain is under control. She reports she had a BM today.  She takes midodrine for chronic hypotension. CIR recommended due to functional decline.     Review of Systems  Constitutional:  Negative for chills and fever.  HENT:  Negative for hearing loss.   Eyes:  Positive for double vision. Negative for blurred vision.  Respiratory:  Negative for cough and shortness of breath.   Cardiovascular:  Negative for chest pain and palpitations.  Gastrointestinal:  Negative for constipation, heartburn, nausea and vomiting.       Reports chronic issue with occasional vomiting after meals  Genitourinary:  Positive for frequency.  Musculoskeletal:  Positive for joint pain and myalgias.  Neurological:  Positive for sensory change and weakness. Negative for headaches.  Psychiatric/Behavioral:  Negative for depression.      Past Medical History:  Diagnosis Date   Coronary artery disease    Diabetes mellitus without complication ([place])    insulin pump   Headache    since ear problem started   Hypertension    Myocardial infarction ([place]) 11/01/13    Past Surgical History:  Procedure Laterality Date   ABDOMINAL HYSTERECTOMY     BACK SURGERY      tail bone removed after fall/fracture   CATARACT EXTRACTION BILATERAL W/ ANTERIOR VITRECTOMY     CORONARY ANGIOPLASTY WITH STENT PLACEMENT  11/01/2013   [facility], stent x2   EYE SURGERY     HIP PINNING,CANNULATED Right 01/07/2019   Procedure: CANNULATED HIP PINNING;  Surgeon: Corky Mull, MD;  Location: [facility] ORS;  Service: Orthopedics;  Laterality: Right;   HIP PINNING,CANNULATED Left 03/29/2022   Procedure: PERCUTANEOUS FIXATION OF FEMORAL NECK;  Surgeon: Earnestine Leys, MD;  Location: [facility] ORS;  Service: Orthopedics;  Laterality: Left;   LEFT HEART CATH AND CORONARY ANGIOGRAPHY N/A 08/14/2016   Procedure: Left Heart Cath and Coronary Angiography;  Surgeon: Minna Merritts, MD;  Location: [place] [facility];  Service: Cardiovascular;  Laterality: N/A;   MELANOMA EXCISION Right    arm with skin graft   MYRINGOTOMY WITH TUBE PLACEMENT Left 08/21/2015   Procedure: MYRINGOTOMY WITH TUBE PLACEMENT;  Surgeon: Carloyn Manner, MD;  Location: [facility];  Service: ENT;  Laterality: Left;   NASOPHARYNGEAL BIOPSY N/A 11/07/2015   Procedure: NASOPHARYNGEAL BIOPSY;  Surgeon: Carloyn Manner, MD;  Location: [facility] OR[facility];  Service: ENT;  Laterality: N/A;   TONSILLECTOMY      Family History  Problem Relation Age of Onset   Lung cancer Father    Breast cancer Maternal Aunt     Social History: Married. Independent with rollator but 4-5 falls in 6 months and was getting HHPT. Husband is bedbound and  has PCS aide. Per  reports that she quit smoking about 23 years ago. Her smoking use included cigarettes. She has never used smokeless tobacco. She reports current alcohol use of about 5.0 standard drinks of alcohol per week. She reports that she does not use drugs.   Allergies  Allergen Reactions   Bupropion     Other reaction(s): Other (See Comments) Constipation    Percocet [Oxycodone-Acetaminophen] Nausea And Vomiting   Statins     Joint pain   Sulfa Antibiotics Nausea And  Vomiting    Medications Prior to Admission  Medication Sig Dispense Refill   acetaminophen (TYLENOL) 325 MG tablet Take 2 tablets (650 mg total) by mouth every 6 (six) hours as needed for mild pain, fever or headache (pain score 1-3 or temp > 100.5). (Patient not taking: Reported on 10/11/2021)     alendronate (FOSAMAX) 70 MG tablet Take 70 mg by mouth once a week.     aspirin 81 MG tablet Take 81 mg by mouth daily.     buPROPion (WELLBUTRIN XL) 150 MG 24 hr tablet Take 150 mg by mouth every morning.     docusate sodium (COLACE) 100 MG capsule Take 1 capsule (100 mg total) by mouth 2 (two) times daily. 10 capsule 0   [START ON 04/03/2022] ferrous sulfate 325 (65 FE) MG tablet Take 1 tablet (325 mg total) by mouth daily with breakfast.  3   Glucagon, rDNA, (GLUCAGON EMERGENCY IJ) Inject as directed as needed (for low blood sugar).     Insulin Human (INSULIN PUMP) SOLN INSULIN PUMP THREE TIMES DAILY.     insulin lispro (HUMALOG) 100 UNIT/ML injection Inject 0-23 Units into the skin daily. VIA PUMP     midodrine (PROAMATINE) 5 MG tablet Take 5 mg by mouth 3 (three) times daily.     rosuvastatin (CRESTOR) 10 MG tablet Take 10 mg by mouth at bedtime.     senna-docusate (SENOKOT-S) 8.6-50 MG tablet Take 1 tablet by mouth at bedtime as needed for mild constipation.     sertraline (ZOLOFT) 100 MG tablet Take 100 mg by mouth daily.     zinc gluconate 50 MG tablet Take 50 mg by mouth daily.        Home: Home Living Family/patient expects to be discharged to:: Skilled nursing facility Living Arrangements: Spouse/significant other Available Help at Discharge: Family, Available PRN/intermittently (patient lives with husband who has had CVA. Daughter assists as able.) Type of Home: House Home Access: Ramped entrance Home Layout: Two level, Able to live on main level with bedroom/bathroom Home Equipment: Conservation officer, nature (2 wheels), Rollator (4 wheels), Cane - single point   Functional History: Prior  Function Prior Level of Function : Needs assist, History of Falls (last six months) Mobility Comments: Pt reports she's not allowed to drive. Pt reports she primarily uses a rollator in the home, RW outside of the home, recently started receiving HHPT services. Pt lives with her husband who she cares for (he requires hoyer lift to transfer OOB to w/c, he also has a personal care aide himself). Pt endorses 4-5 falls in the past 6 months with last one occurring the morning she presented to the hospital. ADLs Comments: Independent with basic ADLS   Functional Status:  Mobility: Bed Mobility Overal bed mobility: Needs Assistance Bed Mobility: Supine to Sit Supine to sit: Mod assist, Max assist, HOB elevated Sit to supine: Mod assist General bed mobility comments: Increased time and assistance to safely achieve EOB short sit. Pain  limited Transfers Overall transfer level: Needs assistance Equipment used: Rolling walker (2 wheels) Transfers: Bed to chair/wheelchair/BSC, Sit to/from Stand Sit to Stand: Mod assist, From elevated surface Bed to/from chair/wheelchair/BSC transfer type:: Stand pivot Stand pivot transfers: Max assist General transfer comment: Pt was able to stand 2 x EOB(elevated) prior to stand pivot towards R to recliner. She does well adhereing to proper TTWB LLE but struggles to pivot on RLE. INcreased time for all mobility and transfers due to pain. Ambulation/Gait Ambulation/Gait assistance: Mod assist Gait Distance (Feet): 3 Feet Assistive device: Rolling walker (2 wheels) Gait Pattern/deviations: Step-to pattern, Antalgic General Gait Details: unable due to inability to perform TTWB LLE while advancing RLE Gait velocity: decreased   ADL:   Cognition: Cognition Overall Cognitive Status: Within Functional Limits for tasks assessed Orientation Level: Oriented to person, Oriented to place, Oriented to situation Cognition Arousal/Alertness: Awake/alert Behavior During  Therapy: WFL for tasks assessed/performed Overall Cognitive Status: Within Functional Limits for tasks assessed Area of Impairment: Safety/judgement, Awareness, Orientation, Problem solving Orientation Level: Disoriented to, Situation Safety/Judgement: Decreased awareness of safety, Decreased awareness of deficits Awareness: Emergent Problem Solving: Requires verbal cues, Requires tactile cues, Difficulty sequencing General Comments: Pt is A and O x 3. Agrees to session and cooperative throughout     Blood pressure 132/61, pulse 98, temperature 97.9 F (36.6 C), temperature source Oral, resp. rate 19, height '5\' 2"'$  (1.575 m), weight 53.8 kg, SpO2 98 %. Physical Exam  General: Alert and oriented x 3, No apparent distress HEENT: Head is normocephalic, atraumatic, PERRLA, EOMI, sclera anicteric, oral mucosa pink and moist Neck: Supple without JVD or lymphadenopathy Heart: Reg rate and rhythm. No murmurs rubs or gallops Chest: CTA bilaterally without wheezes, rales, or rhonchi; no distress Abdomen: Soft, non-tender, non-distended, bowel sounds positive. Omnipod and dexcom on lower abdomen  Extremities: No clubbing, cyanosis, or edema. Pulses are 2+ Psych: Pt's affect is appropriate. 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 Neuro:  Follows commands, normal speech and language, CN 2-12 grossly intact, CN 2-12 grossly intact Sensation intact to LT in all 4 extremities Strength 5/5 in b/l UE Strength 4+/5 in RLE Unable to lift LLE to gravity at hip or knee, strength 4/5 ankle PF and DF Musculoskeletal: L hip appropriately tender, no joint swelling noted      Results for orders placed or performed during the hospital encounter of 03/28/22 (from the past 48 hour(s))  Glucose, capillary     Status: Abnormal   Collection Time: 03/31/22   4:39 PM  Result Value Ref Range   Glucose-Capillary 131 (H) 70 - 99 mg/dL    Comment: Glucose reference range applies only to samples taken after fasting for at least 8 hours.  Glucose, capillary     Status: Abnormal   Collection Time: 03/31/22  9:12 PM  Result Value Ref Range   Glucose-Capillary 118 (H) 70 - 99 mg/dL    Comment: Glucose reference range applies only to samples taken after fasting for at least 8 hours.   Comment 1 Notify RN   Magnesium     Status: None   Collection Time: 04/01/22  3:55 AM  Result Value Ref Range   Magnesium 1.9 1.7 - 2.4 mg/dL    Comment: Performed at [facility], [address], [place], [place] [address]  Basic metabolic panel  Status: Abnormal   Collection Time: 04/01/22  3:55 AM  Result Value Ref Range   Sodium 140 135 - 145 mmol/L   Potassium 3.8 3.5 - 5.1 mmol/L   Chloride 106 98 - 111 mmol/L   CO2 28 22 - 32 mmol/L   Glucose, Bld 126 (H) 70 - 99 mg/dL    Comment: Glucose reference range applies only to samples taken after fasting for at least 8 hours.   BUN 18 8 - 23 mg/dL   Creatinine, Ser 0.75 0.44 - 1.00 mg/dL   Calcium 8.4 (L) 8.9 - 10.3 mg/dL   GFR, Estimated >60 >60 mL/min    Comment: (NOTE) Calculated using the CKD-EPI Creatinine Equation (2021)    Anion gap 6 5 - 15    Comment: Performed at [facility], [address] [place], [place] [address]  CBC     Status: Abnormal   Collection Time: 04/01/22  3:55 AM  Result Value Ref Range   WBC 6.2 4.0 - 10.5 K/uL   RBC 2.89 (L) 3.87 - 5.11 MIL/uL   Hemoglobin 8.9 (L) 12.0 - 15.0 g/dL   HCT 27.5 (L) 36.0 - 46.0 %   MCV 95.2 80.0 - 100.0 fL   MCH 30.8 26.0 - 34.0 pg   MCHC 32.4 30.0 - 36.0 g/dL   RDW 13.9 11.5 - 15.5 %   Platelets 185 150 - 400 K/uL   nRBC 0.0 0.0 - 0.2 %    Comment: Performed at [facility], [address] [place], [place] [address]  Glucose, capillary     Status: Abnormal   Collection Time: 04/01/22  8:11 AM  Result Value  Ref Range   Glucose-Capillary 227 (H) 70 - 99 mg/dL    Comment: Glucose reference range applies only to samples taken after fasting for at least 8 hours.  Glucose, capillary     Status: Abnormal   Collection Time: 04/01/22 11:31 AM  Result Value Ref Range   Glucose-Capillary 192 (H) 70 - 99 mg/dL    Comment: Glucose reference range applies only to samples taken after fasting for at least 8 hours.  Vitamin B12     Status: None   Collection Time: 04/01/22  2:45 PM  Result Value Ref Range   Vitamin B-12 325 180 - 914 pg/mL    Comment: (NOTE) This assay is not validated for testing neonatal or myeloproliferative syndrome specimens for Vitamin B12 levels. Performed at [facility], [place] [address] [place], [place] [address]   Glucose, capillary     Status: Abnormal   Collection Time: 04/01/22  4:47 PM  Result Value Ref Range   Glucose-Capillary 143 (H) 70 - 99 mg/dL    Comment: Glucose reference range applies only to samples taken after fasting for at least 8 hours.  Glucose, capillary     Status: Abnormal   Collection Time: 04/01/22 10:29 PM  Result Value Ref Range   Glucose-Capillary 196 (H) 70 - 99 mg/dL    Comment: Glucose reference range applies only to samples taken after fasting for at least 8 hours.  Glucose, capillary     Status: Abnormal   Collection Time: 04/02/22  2:58 AM  Result Value Ref Range   Glucose-Capillary 141 (H) 70 - 99 mg/dL    Comment: Glucose reference range applies only to samples taken after fasting for at least 8 hours.  Magnesium     Status: None   Collection Time: 04/02/22  5:25 AM  Result Value  Ref Range   Magnesium 1.9 1.7 - 2.4 mg/dL    Comment: Performed at [facility], [address] [place], [place] [address]  Basic metabolic panel     Status: Abnormal   Collection Time: 04/02/22  5:25 AM  Result Value Ref Range   Sodium 141 135 - 145 mmol/L   Potassium 3.4 (L) 3.5 - 5.1 mmol/L   Chloride 105 98 - 111 mmol/L   CO2 27 22 -  32 mmol/L   Glucose, Bld 154 (H) 70 - 99 mg/dL    Comment: Glucose reference range applies only to samples taken after fasting for at least 8 hours.   BUN 19 8 - 23 mg/dL   Creatinine, Ser 0.71 0.44 - 1.00 mg/dL   Calcium 8.7 (L) 8.9 - 10.3 mg/dL   GFR, Estimated >60 >60 mL/min    Comment: (NOTE) Calculated using the CKD-EPI Creatinine Equation (2021)    Anion gap 9 5 - 15    Comment: Performed at [facility], [address] [place], [place] [address]  CBC with Differential/Platelet     Status: Abnormal   Collection Time: 04/02/22  5:25 AM  Result Value Ref Range   WBC 4.5 4.0 - 10.5 K/uL   RBC 2.87 (L) 3.87 - 5.11 MIL/uL   Hemoglobin 9.0 (L) 12.0 - 15.0 g/dL   HCT 27.6 (L) 36.0 - 46.0 %   MCV 96.2 80.0 - 100.0 fL   MCH 31.4 26.0 - 34.0 pg   MCHC 32.6 30.0 - 36.0 g/dL   RDW 13.9 11.5 - 15.5 %   Platelets 199 150 - 400 K/uL   nRBC 0.0 0.0 - 0.2 %   Neutrophils Relative % 64 %   Neutro Abs 3.0 1.7 - 7.7 K/uL   Lymphocytes Relative 21 %   Lymphs Abs 0.9 0.7 - 4.0 K/uL   Monocytes Relative 10 %   Monocytes Absolute 0.4 0.1 - 1.0 K/uL   Eosinophils Relative 4 %   Eosinophils Absolute 0.2 0.0 - 0.5 K/uL   Basophils Relative 1 %   Basophils Absolute 0.0 0.0 - 0.1 K/uL   Immature Granulocytes 0 %   Abs Immature Granulocytes 0.01 0.00 - 0.07 K/uL    Comment: Performed at [facility], [address] [place], [place] [address]  Glucose, capillary     Status: Abnormal   Collection Time: 04/02/22  8:03 AM  Result Value Ref Range   Glucose-Capillary 157 (H) 70 - 99 mg/dL    Comment: Glucose reference range applies only to samples taken after fasting for at least 8 hours.  Glucose, capillary     Status: Abnormal   Collection Time: 04/02/22  8:14 AM  Result Value Ref Range   Glucose-Capillary 165 (H) 70 - 99 mg/dL    Comment: Glucose reference range applies only to samples taken after fasting for at least 8 hours.  Glucose, capillary     Status: Abnormal    Collection Time: 04/02/22 11:38 AM  Result Value Ref Range   Glucose-Capillary 154 (H) 70 - 99 mg/dL    Comment: Glucose reference range applies only to samples taken after fasting for at least 8 hours.   No results found.    Blood pressure 132/61, pulse 98, temperature 97.9 F (36.6 C), temperature source Oral, resp. rate 19, height '5\' 2"'$  (1.575 m), weight 53.8 kg, SpO2 98 %.  Medical Problem List and Plan: 1. Functional deficits secondary to Left femoral neck fracture s/p percutaneous fixation on  03/29/22  -patient may shower, please cover incision  -ELOS/Goals: 12-14 days , PT/OT supervision to mod I  -Admit to CIR 2.  Antithrombotics: -DVT/anticoagulation:  Pharmaceutical: Lovenox  -antiplatelet therapy: ASA '81mg'$  3. Pain Management: Hydrocodone prn severe pain.  4. Mood/Behavior/Sleep: LCSW to follow for evaluation and support.   -antipsychotic agents: N/A 5. Neuropsych/cognition: This patient is capable of making decisions on her own behalf. 6. Skin/Wound Care: Routine pressure relief measures.  7. Fluids/Electrolytes/Nutrition: Monitor I/O. Check CMET in am.  8. Left subcapital femur Fx s/p fixation: TDWB per Dr. Earnestine Leys 9.T1DM with prior episodes of hypoglycemia: Continue omnipod and dexcom --will consult diabetic coordinator to assist with management.  --Followed by Dr Mee Hives. 10. Lightheadedness/weakness: Question due to orthostatic v/s neurogenic due to DM.  --Also reports of diplopia per opthal notes.  - Continue Midodrine for BP support.  11. CAD/Blood pressure: Denies chest pain 12. Depression/anxiety?: Continue Zoloft and Bupropion.  13. Acute blood loss anemia: Stable around 9.0. Recheck CBC in am.   -Continue iron supplementation 14. Hypokalemia: Was supplemented X 1 today. K+ 3.4 12/28. Recheck in am.  15. Constipation: She reports BM today  -Continue Senokot-s     Bary Leriche, PA-C 04/02/2022  I have personally performed a face to face  diagnostic evaluation of this patient and formulated the key components of the plan.  Additionally, I have personally reviewed laboratory data, imaging studies, as well as relevant notes and concur with the physician assistant's documentation above.  The patient's status has not changed from the original H&P.  Any changes in documentation from the acute care chart have been noted above.  Jennye Boroughs, MD, [facility] Drown

## 2022-04-02 NOTE — Plan of Care (Signed)
  Problem: Education: Goal: Ability to describe self-care measures that may prevent or decrease complications (Diabetes Survival Skills Education) will improve 04/02/2022 1116 by Jodi Marble, LPN Outcome: Adequate for Discharge 04/02/2022 1032 by Jodi Marble, LPN Outcome: Progressing Goal: Individualized Educational Video(s) Outcome: Adequate for Discharge   Problem: Coping: Goal: Ability to adjust to condition or change in health will improve Outcome: Adequate for Discharge   Problem: Fluid Volume: Goal: Ability to maintain a balanced intake and output will improve 04/02/2022 1116 by Jodi Marble, LPN Outcome: Adequate for Discharge 04/02/2022 1032 by Jodi Marble, LPN Outcome: Progressing   Problem: Health Behavior/Discharge Planning: Goal: Ability to identify and utilize available resources and services will improve 04/02/2022 1116 by Jodi Marble, LPN Outcome: Adequate for Discharge 04/02/2022 1032 by Jodi Marble, LPN Outcome: Progressing Goal: Ability to manage health-related needs will improve Outcome: Adequate for Discharge   Problem: Metabolic: Goal: Ability to maintain appropriate glucose levels will improve Outcome: Adequate for Discharge   Problem: Nutritional: Goal: Maintenance of adequate nutrition will improve 04/02/2022 1116 by Jodi Marble, LPN Outcome: Adequate for Discharge 04/02/2022 1032 by Jodi Marble, LPN Outcome: Progressing Goal: Progress toward achieving an optimal weight will improve Outcome: Adequate for Discharge   Problem: Skin Integrity: Goal: Risk for impaired skin integrity will decrease Outcome: Adequate for Discharge   Problem: Tissue Perfusion: Goal: Adequacy of tissue perfusion will improve Outcome: Adequate for Discharge   Problem: Education: Goal: Verbalization of understanding the information provided (i.e., activity precautions, restrictions, etc) will  improve Outcome: Adequate for Discharge Goal: Individualized Educational Video(s) Outcome: Adequate for Discharge   Problem: Activity: Goal: Ability to ambulate and perform ADLs will improve Outcome: Adequate for Discharge   Problem: Clinical Measurements: Goal: Postoperative complications will be avoided or minimized Outcome: Adequate for Discharge   Problem: Self-Concept: Goal: Ability to maintain and perform role responsibilities to the fullest extent possible will improve Outcome: Adequate for Discharge   Problem: Pain Management: Goal: Pain level will decrease Outcome: Adequate for Discharge   Problem: Education: Goal: Knowledge of General Education information will improve Description: Including pain rating scale, medication(s)/side effects and non-pharmacologic comfort measures Outcome: Adequate for Discharge   Problem: Health Behavior/Discharge Planning: Goal: Ability to manage health-related needs will improve Outcome: Adequate for Discharge   Problem: Clinical Measurements: Goal: Ability to maintain clinical measurements within normal limits will improve Outcome: Adequate for Discharge Goal: Will remain free from infection Outcome: Adequate for Discharge Goal: Diagnostic test results will improve Outcome: Adequate for Discharge Goal: Respiratory complications will improve Outcome: Adequate for Discharge Goal: Cardiovascular complication will be avoided Outcome: Adequate for Discharge   Problem: Activity: Goal: Risk for activity intolerance will decrease Outcome: Adequate for Discharge   Problem: Nutrition: Goal: Adequate nutrition will be maintained Outcome: Adequate for Discharge   Problem: Coping: Goal: Level of anxiety will decrease Outcome: Adequate for Discharge   Problem: Elimination: Goal: Will not experience complications related to bowel motility Outcome: Adequate for Discharge Goal: Will not experience complications related to urinary  retention Outcome: Adequate for Discharge   Problem: Pain Managment: Goal: General experience of comfort will improve Outcome: Adequate for Discharge   Problem: Safety: Goal: Ability to remain free from injury will improve Outcome: Adequate for Discharge   Problem: Skin Integrity: Goal: Risk for impaired skin integrity will decrease Outcome: Adequate for Discharge

## 2022-04-02 NOTE — Care Management Important Message (Signed)
Important Message  Patient Details  Name: Diana Frost MRN: [id number] Date of Birth: 11-06-1945   Medicare Important Message Given:  N/A - LOS <3 / Initial given by admissions     [name] A Micajah Dennin 04/02/2022, 9:34 AM

## 2022-04-02 NOTE — Progress Notes (Signed)
Inpatient Rehabilitation Admission Medication Review by a Pharmacist  A complete drug regimen review was completed for this patient to identify any potential clinically significant medication issues.  High Risk Drug Classes Is patient taking? Indication by Medication  Antipsychotic Yes Compazine (PO/PR/IM) - prn nausea  Anticoagulant Yes Enoxaparin - VTE ppx  Antibiotic No   Opioid Yes Norco - prn severe pain  Antiplatelet Yes ASA - hx CAD  Hypoglycemics/insulin Yes Insulin pump - T1DM  Vasoactive Medication Yes Midodrine - hypotension  Chemotherapy No   Other Yes Bupropion, sertraline - mood Melatonin - sleep Robitussin DM - cough Benadryl - prn itching Iron - anemia Crestor - HLD     Type of Medication Issue Identified Description of Issue Recommendation(s)  Drug Interaction(s) (clinically significant)     Duplicate Therapy     Allergy     No Medication Administration End Date     Incorrect Dose     Additional Drug Therapy Needed     Significant med changes from prior encounter (inform family/care partners about these prior to discharge).    Other       Clinically significant medication issues were identified that warrant physician communication and completion of prescribed/recommended actions by midnight of the next day:  No  Name of provider notified for urgent issues identified:   Provider Method of Notification:   Pharmacist comments:   Time spent performing this drug regimen review (minutes):  Chance, PharmD, BCPS 04/02/2022 2:52 PM

## 2022-04-02 NOTE — Progress Notes (Signed)
Inpatient Rehab Admissions Coordinator:   I have a bed available for this pt to admit to CIR today.  TOC and MD aware, I will let pt/family know.   Shann Medal, PT, DPT Admissions Coordinator [phone] 04/02/22  9:55 AM

## 2022-04-02 NOTE — Progress Notes (Signed)
Contacted and reported to Autumn, Therapist, sports at [facility]. Pt discharged with all personal belongings, insulin pump in place, and surgical dressing in place, clean, dry, and intact. Pt transported to CIR via [facility] transportation.

## 2022-04-02 NOTE — H&P (Incomplete)
Physical Medicine and Rehabilitation Admission H&P    Chief Complaint  Patient presents with   Functional deficits    HPI:  SHAKERA EBRAHIMI is a 76 year old female with history of T1DM, CAD, HTN, multiple falls w/gait disorder;  who was admitted to [facility] on 03/28/22 after a mechanical fall with onset of left hip pain and inability to walk. She was found to have left non-displaced subcapital hip fracture and underwent percutaneous fixation of femoral neck by Dr. Earnestine Leys on 03/29/22. Post op to be TTWB and on Lovenox for DVT prophylaxis. She did have drop in K to 3.4 today which was supplemented and ABLA being monitored. Post op with pain, reports of confusion, difficulty with WB restrictions as well as weakness and fatigue. CIR recommended due to functional decline.     Review of Systems  Constitutional:  Negative for chills and fever.  HENT:  Negative for hearing loss.   Eyes:  Positive for double vision. Negative for blurred vision.  Respiratory:  Negative for cough and shortness of breath.   Cardiovascular:  Negative for chest pain and palpitations.  Gastrointestinal:  Negative for constipation and heartburn.  Genitourinary:  Positive for frequency.  Musculoskeletal:  Positive for joint pain and myalgias.  Neurological:  Positive for sensory change and weakness. Negative for headaches.  Psychiatric/Behavioral:  Negative for depression.      Past Medical History:  Diagnosis Date   Coronary artery disease    Diabetes mellitus without complication ([place])    insulin pump   Headache    since ear problem started   Hypertension    Myocardial infarction ([place]) 11/01/13    Past Surgical History:  Procedure Laterality Date   ABDOMINAL HYSTERECTOMY     BACK SURGERY     tail bone removed after fall/fracture   CATARACT EXTRACTION BILATERAL W/ ANTERIOR VITRECTOMY     CORONARY ANGIOPLASTY WITH STENT PLACEMENT  11/01/2013   [facility], stent x2   EYE SURGERY     HIP  PINNING,CANNULATED Right 01/07/2019   Procedure: CANNULATED HIP PINNING;  Surgeon: Corky Mull, MD;  Location: [facility] OR[facility];  Service: Orthopedics;  Laterality: Right;   HIP PINNING,CANNULATED Left 03/29/2022   Procedure: PERCUTANEOUS FIXATION OF FEMORAL NECK;  Surgeon: Earnestine Leys, MD;  Location: [facility] ORS;  Service: Orthopedics;  Laterality: Left;   LEFT HEART CATH AND CORONARY ANGIOGRAPHY N/A 08/14/2016   Procedure: Left Heart Cath and Coronary Angiography;  Surgeon: Minna Merritts, MD;  Location: [facility];  Service: Cardiovascular;  Laterality: N/A;   MELANOMA EXCISION Right    arm with skin graft   MYRINGOTOMY WITH TUBE PLACEMENT Left 08/21/2015   Procedure: MYRINGOTOMY WITH TUBE PLACEMENT;  Surgeon: Carloyn Manner, MD;  Location: [facility];  Service: ENT;  Laterality: Left;   NASOPHARYNGEAL BIOPSY N/A 11/07/2015   Procedure: NASOPHARYNGEAL BIOPSY;  Surgeon: Carloyn Manner, MD;  Location: [facility] OR[facility];  Service: ENT;  Laterality: N/A;   TONSILLECTOMY      Family History  Problem Relation Age of Onset   Lung cancer Father    Breast cancer Maternal Aunt     Social History: Married. Independent with rollator but 4-5 falls in 6 months and was getting HHPT. Husband is bedbound and has PCS aide. Per  reports that she quit smoking about 23 years ago. Her smoking use included cigarettes. She has never used smokeless tobacco. She reports current alcohol use of about 5.0 standard drinks of alcohol per week. She  reports that she does not use drugs.   Allergies  Allergen Reactions   Bupropion     Other reaction(s): Other (See Comments) Constipation    Percocet [Oxycodone-Acetaminophen] Nausea And Vomiting   Statins     Joint pain   Sulfa Antibiotics Nausea And Vomiting    Medications Prior to Admission  Medication Sig Dispense Refill   alendronate (FOSAMAX) 70 MG tablet Take 70 mg by mouth once a week.     aspirin 81 MG tablet Take 81 mg by mouth daily.      buPROPion (WELLBUTRIN XL) 150 MG 24 hr tablet Take 150 mg by mouth every morning.     docusate sodium (COLACE) 100 MG capsule Take 1 capsule (100 mg total) by mouth 2 (two) times daily. 10 capsule 0   Glucagon, rDNA, (GLUCAGON EMERGENCY IJ) Inject as directed as needed (for low blood sugar).     insulin lispro (HUMALOG) 100 UNIT/ML injection Inject 0-23 Units into the skin daily. VIA PUMP     midodrine (PROAMATINE) 5 MG tablet Take 5 mg by mouth 3 (three) times daily.     rosuvastatin (CRESTOR) 10 MG tablet Take 10 mg by mouth at bedtime.     sertraline (ZOLOFT) 100 MG tablet Take 100 mg by mouth daily.     zinc gluconate 50 MG tablet Take 50 mg by mouth daily.     acetaminophen (TYLENOL) 325 MG tablet Take 2 tablets (650 mg total) by mouth every 6 (six) hours as needed for mild pain, fever or headache (pain score 1-3 or temp > 100.5). (Patient not taking: Reported on 10/11/2021)     DULoxetine (CYMBALTA) 20 MG capsule Take 40 mg by mouth as directed. Take 1 capsule (20 mg total) by mouth once daily for 14 days, THEN 2 capsules (40 mg total) once daily for 14 days.1-2     DULoxetine (CYMBALTA) 60 MG capsule Take 60 mg by mouth daily. (Patient not taking: Reported on 03/29/2022)     LANTUS SOLOSTAR 100 UNIT/ML Solostar Pen Inject 7 Units into the skin at bedtime. Inject 0.07 mL (7 Units total) under the skin nightly. Discard pen 28 days after first use. (Patient not taking: Reported on 03/29/2022)     ondansetron (ZOFRAN-ODT) 4 MG disintegrating tablet Take 1 tablet (4 mg total) by mouth every 8 (eight) hours as needed for nausea or vomiting. (Patient not taking: Reported on 03/29/2022) 10 tablet 0      Home: Home Living Family/patient expects to be discharged to:: Skilled nursing facility Living Arrangements: Spouse/significant other Available Help at Discharge: Family, Available PRN/intermittently (patient lives with husband who has had CVA. Daughter assists as able.) Type of Home: House Home  Access: Ramped entrance Home Layout: Two level, Able to live on main level with bedroom/bathroom Home Equipment: Conservation officer, nature (2 wheels), Rollator (4 wheels), Cane - single point   Functional History: Prior Function Prior Level of Function : Needs assist, History of Falls (last six months) Mobility Comments: Pt reports she's not allowed to drive. Pt reports she primarily uses a rollator in the home, RW outside of the home, recently started receiving HHPT services. Pt lives with her husband who she cares for (he requires hoyer lift to transfer OOB to w/c, he also has a personal care aide himself). Pt endorses 4-5 falls in the past 6 months with last one occurring the morning she presented to the hospital. ADLs Comments: Independent with basic ADLS  Functional Status:  Mobility: Bed Mobility Overal bed mobility:  Needs Assistance Bed Mobility: Supine to Sit Supine to sit: Mod assist, Max assist, HOB elevated Sit to supine: Mod assist General bed mobility comments: Increased time and assistance to safely achieve EOB short sit. Pain limited Transfers Overall transfer level: Needs assistance Equipment used: Rolling walker (2 wheels) Transfers: Bed to chair/wheelchair/BSC, Sit to/from Stand Sit to Stand: Mod assist, From elevated surface Bed to/from chair/wheelchair/BSC transfer type:: Stand pivot Stand pivot transfers: Max assist General transfer comment: Pt was able to stand 2 x EOB(elevated) prior to stand pivot towards R to recliner. She does well adhereing to proper TTWB LLE but struggles to pivot on RLE. INcreased time for all mobility and transfers due to pain. Ambulation/Gait Ambulation/Gait assistance: Mod assist Gait Distance (Feet): 3 Feet Assistive device: Rolling walker (2 wheels) Gait Pattern/deviations: Step-to pattern, Antalgic General Gait Details: unable due to inability to perform TTWB LLE while advancing RLE Gait velocity: decreased    ADL:     Cognition: Cognition Overall Cognitive Status: Within Functional Limits for tasks assessed Orientation Level: Oriented to person, Oriented to place, Oriented to situation Cognition Arousal/Alertness: Awake/alert Behavior During Therapy: WFL for tasks assessed/performed Overall Cognitive Status: Within Functional Limits for tasks assessed Area of Impairment: Safety/judgement, Awareness, Orientation, Problem solving Orientation Level: Disoriented to, Situation Safety/Judgement: Decreased awareness of safety, Decreased awareness of deficits Awareness: Emergent Problem Solving: Requires verbal cues, Requires tactile cues, Difficulty sequencing General Comments: Pt is A and O x 3. Agrees to session and cooperative throughout   Blood pressure 132/60, pulse 96, temperature 98.2 F (36.8 C), resp. rate 20, height '5\' 2"'$  (1.575 m), weight 53.8 kg, SpO2 97 %. Physical Exam  Results for orders placed or performed during the hospital encounter of 03/28/22 (from the past 48 hour(s))  Glucose, capillary     Status: Abnormal   Collection Time: 03/31/22 11:18 AM  Result Value Ref Range   Glucose-Capillary 192 (H) 70 - 99 mg/dL    Comment: Glucose reference range applies only to samples taken after fasting for at least 8 hours.  Glucose, capillary     Status: Abnormal   Collection Time: 03/31/22  2:58 PM  Result Value Ref Range   Glucose-Capillary 56 (L) 70 - 99 mg/dL    Comment: Glucose reference range applies only to samples taken after fasting for at least 8 hours.  Glucose, capillary     Status: Abnormal   Collection Time: 03/31/22  3:31 PM  Result Value Ref Range   Glucose-Capillary 34 (LL) 70 - 99 mg/dL    Comment: Glucose reference range applies only to samples taken after fasting for at least 8 hours.   Comment 1 Notify RN   Glucose, capillary     Status: Abnormal   Collection Time: 03/31/22  3:42 PM  Result Value Ref Range   Glucose-Capillary 232 (H) 70 - 99 mg/dL    Comment:  Glucose reference range applies only to samples taken after fasting for at least 8 hours.  Glucose, capillary     Status: Abnormal   Collection Time: 03/31/22  4:39 PM  Result Value Ref Range   Glucose-Capillary 131 (H) 70 - 99 mg/dL    Comment: Glucose reference range applies only to samples taken after fasting for at least 8 hours.  Glucose, capillary     Status: Abnormal   Collection Time: 03/31/22  9:12 PM  Result Value Ref Range   Glucose-Capillary 118 (H) 70 - 99 mg/dL    Comment: Glucose reference range applies only to samples  taken after fasting for at least 8 hours.   Comment 1 Notify RN   Magnesium     Status: None   Collection Time: 04/01/22  3:55 AM  Result Value Ref Range   Magnesium 1.9 1.7 - 2.4 mg/dL    Comment: Performed at [facility], [address] [place], [place] [address]  Basic metabolic panel     Status: Abnormal   Collection Time: 04/01/22  3:55 AM  Result Value Ref Range   Sodium 140 135 - 145 mmol/L   Potassium 3.8 3.5 - 5.1 mmol/L   Chloride 106 98 - 111 mmol/L   CO2 28 22 - 32 mmol/L   Glucose, Bld 126 (H) 70 - 99 mg/dL    Comment: Glucose reference range applies only to samples taken after fasting for at least 8 hours.   BUN 18 8 - 23 mg/dL   Creatinine, Ser 0.75 0.44 - 1.00 mg/dL   Calcium 8.4 (L) 8.9 - 10.3 mg/dL   GFR, Estimated >60 >60 mL/min    Comment: (NOTE) Calculated using the CKD-EPI Creatinine Equation (2021)    Anion gap 6 5 - 15    Comment: Performed at [facility], [address] [place], [place] [address]  CBC     Status: Abnormal   Collection Time: 04/01/22  3:55 AM  Result Value Ref Range   WBC 6.2 4.0 - 10.5 K/uL   RBC 2.89 (L) 3.87 - 5.11 MIL/uL   Hemoglobin 8.9 (L) 12.0 - 15.0 g/dL   HCT 27.5 (L) 36.0 - 46.0 %   MCV 95.2 80.0 - 100.0 fL   MCH 30.8 26.0 - 34.0 pg   MCHC 32.4 30.0 - 36.0 g/dL   RDW 13.9 11.5 - 15.5 %   Platelets 185 150 - 400 K/uL   nRBC 0.0 0.0 - 0.2 %    Comment: Performed at  [facility], [address] [place], [place] [address]  Glucose, capillary     Status: Abnormal   Collection Time: 04/01/22  8:11 AM  Result Value Ref Range   Glucose-Capillary 227 (H) 70 - 99 mg/dL    Comment: Glucose reference range applies only to samples taken after fasting for at least 8 hours.  Glucose, capillary     Status: Abnormal   Collection Time: 04/01/22 11:31 AM  Result Value Ref Range   Glucose-Capillary 192 (H) 70 - 99 mg/dL    Comment: Glucose reference range applies only to samples taken after fasting for at least 8 hours.  Vitamin B12     Status: None   Collection Time: 04/01/22  2:45 PM  Result Value Ref Range   Vitamin B-12 325 180 - 914 pg/mL    Comment: (NOTE) This assay is not validated for testing neonatal or myeloproliferative syndrome specimens for Vitamin B12 levels. Performed at [facility], [place] [address], [place], [place] [address]   Glucose, capillary     Status: Abnormal   Collection Time: 04/01/22  4:47 PM  Result Value Ref Range   Glucose-Capillary 143 (H) 70 - 99 mg/dL    Comment: Glucose reference range applies only to samples taken after fasting for at least 8 hours.  Glucose, capillary     Status: Abnormal   Collection Time: 04/01/22 10:29 PM  Result Value Ref Range   Glucose-Capillary 196 (H) 70 - 99 mg/dL    Comment: Glucose reference range applies only to samples taken after fasting for at least 8 hours.  Glucose, capillary     Status: Abnormal   Collection Time: 04/02/22  2:58 AM  Result Value Ref Range   Glucose-Capillary 141 (H) 70 - 99 mg/dL    Comment: Glucose reference range applies only to samples taken after fasting for at least 8 hours.  Magnesium     Status: None   Collection Time: 04/02/22  5:25 AM  Result Value Ref Range   Magnesium 1.9 1.7 - 2.4 mg/dL    Comment: Performed at [facility], [address] [place], [place] [address]  Basic metabolic panel     Status: Abnormal   Collection  Time: 04/02/22  5:25 AM  Result Value Ref Range   Sodium 141 135 - 145 mmol/L   Potassium 3.4 (L) 3.5 - 5.1 mmol/L   Chloride 105 98 - 111 mmol/L   CO2 27 22 - 32 mmol/L   Glucose, Bld 154 (H) 70 - 99 mg/dL    Comment: Glucose reference range applies only to samples taken after fasting for at least 8 hours.   BUN 19 8 - 23 mg/dL   Creatinine, Ser 0.71 0.44 - 1.00 mg/dL   Calcium 8.7 (L) 8.9 - 10.3 mg/dL   GFR, Estimated >60 >60 mL/min    Comment: (NOTE) Calculated using the CKD-EPI Creatinine Equation (2021)    Anion gap 9 5 - 15    Comment: Performed at [facility], [address] [place], [place] [address]  CBC with Differential/Platelet     Status: Abnormal   Collection Time: 04/02/22  5:25 AM  Result Value Ref Range   WBC 4.5 4.0 - 10.5 K/uL   RBC 2.87 (L) 3.87 - 5.11 MIL/uL   Hemoglobin 9.0 (L) 12.0 - 15.0 g/dL   HCT 27.6 (L) 36.0 - 46.0 %   MCV 96.2 80.0 - 100.0 fL   MCH 31.4 26.0 - 34.0 pg   MCHC 32.6 30.0 - 36.0 g/dL   RDW 13.9 11.5 - 15.5 %   Platelets 199 150 - 400 K/uL   nRBC 0.0 0.0 - 0.2 %   Neutrophils Relative % 64 %   Neutro Abs 3.0 1.7 - 7.7 K/uL   Lymphocytes Relative 21 %   Lymphs Abs 0.9 0.7 - 4.0 K/uL   Monocytes Relative 10 %   Monocytes Absolute 0.4 0.1 - 1.0 K/uL   Eosinophils Relative 4 %   Eosinophils Absolute 0.2 0.0 - 0.5 K/uL   Basophils Relative 1 %   Basophils Absolute 0.0 0.0 - 0.1 K/uL   Immature Granulocytes 0 %   Abs Immature Granulocytes 0.01 0.00 - 0.07 K/uL    Comment: Performed at [facility], [address] [place], [place] [address]  Glucose, capillary     Status: Abnormal   Collection Time: 04/02/22  8:03 AM  Result Value Ref Range   Glucose-Capillary 157 (H) 70 - 99 mg/dL    Comment: Glucose reference range applies only to samples taken after fasting for at least 8 hours.  Glucose, capillary     Status: Abnormal   Collection Time: 04/02/22  8:14 AM  Result Value Ref Range   Glucose-Capillary 165 (H)  70 - 99 mg/dL    Comment: Glucose reference range applies only to samples taken after fasting for at least 8 hours.   No results found.    Blood pressure 132/60, pulse 96, temperature 98.2 F (36.8 C), resp. rate 20, height '5\' 2"'$  (1.575 m), weight 53.8 kg, SpO2 97 %.  Medical Problem  List and Plan: 1. Functional deficits secondary to ***  -patient may *** shower  -ELOS/Goals: *** 2.  Antithrombotics: -DVT/anticoagulation:  Pharmaceutical: Lovenox  -antiplatelet therapy: N/A 3. Pain Management: Hydrocodone prn severe pain.  4. Mood/Behavior/Sleep: LCSW to follow for evaluation and support.   -antipsychotic agents: N/A 5. Neuropsych/cognition: This patient +++ capable of making decisions on *** own behalf. 6. Skin/Wound Care: Routine pressure relief measures.  7. Fluids/Electrolytes/Nutrition: Monitor I/O. Check CMET in am.  8. Left subcapital femur Fx s/p fixation: TDWB per Dr. Earnestine Leys 9.T1DM: Continue omnipod--will consult diabetic coordinator to assist with management.  --Followed by Dr Mee Hives. 10. Lightheadedness/weakness: Question due to orthostatic v/s neurogenic due to DM.  --Also reports of diplopia per opthal notes.  11. CAD/Blood pressure: Continue Midodrine for BP support.  12. Depression/anxiety?: Continue Zoloft and Bupropion.  13. Acute blood loss anemia: Stable around 9.0. Recheck CBC in am.  14. Hypokalemia: Was supplemented X 1 today. Recheck in am.  15. Constipation: Has had nausea.             Bary Leriche, PA-C 04/02/2022

## 2022-04-02 NOTE — Inpatient Diabetes Management (Signed)
Inpatient Diabetes Program Recommendations  AACE/ADA: New Consensus Statement on Inpatient Glycemic Control (2015)  Target Ranges:  Prepandial:   less than 140 mg/dL      Peak postprandial:   less than 180 mg/dL (1-2 hours)      Critically ill patients:  140 - 180 mg/dL   Lab Results  Component Value Date   GLUCAP 154 (H) 04/02/2022   HGBA1C 7.2 (H) 10/11/2021    Review of Glycemic Control  Diabetes history: type 1 Outpatient Diabetes medications: Omnipod 5 insulin pump with Humalog, Dexcom CBM Current orders for Inpatient glycemic control: insulin pump in CIR  Inpatient Diabetes Program Recommendations:   Received consult for insulin pump. Patient is wearing her pump at this time. Patient must be able to manage her pump on her own. Patient will need to sign a patient contract while in the hospital and will need to provide her own supplies. Noted that insulin pump order set was ordered correctly. Our inpatient team member spoke with the patient on 12/26 at the bedside.   Noted that blood sugars are trending well at this time.   Harvel Ricks RN BSN CDE Diabetes Coordinator Pager: [phone]  8am-5pm

## 2022-04-02 NOTE — Discharge Summary (Signed)
Physician Discharge Summary   Patient: Diana Frost MRN: [id number] DOB: 05-06-1945  Admit date:     03/28/2022  Discharge date: 04/02/22  Discharge Physician: Hosie Poisson   PCP: Latanya Maudlin, NP   Recommendations at discharge:   Discharge Diagnoses: Principal Problem:   Closed displaced fracture of left femoral neck (HCC) Active Problems:   Hypokalemia   Type 1 diabetes mellitus ([place])   Coronary artery disease involving native coronary artery of native heart   Normocytic anemia   Left displaced femoral neck fracture (HCC)  Resolved Problems:   Hip fracture (HCC)   Closed intertrochanteric fracture of left hip [facility])  Hospital Course: patient is a 76 year old female with medical history significant of hypertension, Dm2 on insulin pump, CAD, Osteoporosis, presented with mechanical fall while in the kitchen, on wood floor.  Pt landed on her left hip.  She sustained a left femoral neck fracture.  Status post ORIF on 03/29/2022   Assessment and Plan:  Left femoral neck fracture S/P PERCUTANEOUS FIXATION OF left FEMORAL NECK on 03/29/22 PT assessment appreciated.  Acute rehab recommended.   Pain is adequately controlled   Diabetes mellitus type 2: Blood glucose has been erratic.  Daughter is insistent on patient being put back on her insulin pump.  Patient is alert oriented, hence insulin pump where he restarted.  Fingerstick glucose monitoring as.  Insulin pump protocol.   Acute on chronic anemia: Likely due to perioperative blood loss. HEMOGLOBIN stable  around 9.    History of vitamin B12 deficiency:  Recheck VIT b12 levels and replace as needed.    Hypokalemia :Resolved.  Monitor and replete PRN   Hx of Hypotension: RESOLVED.    Mild tachycardia: appears to have resolved.    Hyperlipidemia, CAD Cont aspirin '81mg'$  po qday Cont Crestor '10mg'$  po qday   Acute on chronic debility secondary to left hip fracture.  Physical therapy continues to work with patient  during the course of stay.        Consultants: orthopedics.  Procedures performed: femoral neck fracture repair.   Disposition:  CIR Diet recommendation:  Discharge Diet Orders (From admission, onward)     Start     Ordered   04/02/22 0000  Diet - low sodium heart healthy        04/02/22 1004           Regular diet DISCHARGE MEDICATION: Allergies as of 04/02/2022       Reactions   Bupropion    Other reaction(s): Other (See Comments) Constipation    Percocet [oxycodone-acetaminophen] Nausea And Vomiting   Statins    Joint pain   Sulfa Antibiotics Nausea And Vomiting        Medication List     STOP taking these medications    DULoxetine 20 MG capsule Commonly known as: CYMBALTA   DULoxetine 60 MG capsule Commonly known as: CYMBALTA   Lantus SoloStar 100 UNIT/ML Solostar Pen Generic drug: insulin glargine   ondansetron 4 MG disintegrating tablet Commonly known as: ZOFRAN-ODT       TAKE these medications    acetaminophen 325 MG tablet Commonly known as: TYLENOL Take 2 tablets (650 mg total) by mouth every 6 (six) hours as needed for mild pain, fever or headache (pain score 1-3 or temp > 100.5).   alendronate 70 MG tablet Commonly known as: FOSAMAX Take 70 mg by mouth once a week.   aspirin 81 MG tablet Take 81 mg by mouth daily.   buPROPion 150  MG 24 hr tablet Commonly known as: WELLBUTRIN XL Take 150 mg by mouth every morning.   docusate sodium 100 MG capsule Commonly known as: COLACE Take 1 capsule (100 mg total) by mouth 2 (two) times daily.   ferrous sulfate 325 (65 FE) MG tablet Take 1 tablet (325 mg total) by mouth daily with breakfast. Start taking on: April 03, 2022   GLUCAGON EMERGENCY IJ Inject as directed as needed (for low blood sugar).   insulin lispro 100 UNIT/ML injection Commonly known as: HUMALOG Inject 0-23 Units into the skin daily. VIA PUMP   insulin pump Soln INSULIN PUMP THREE TIMES DAILY.   midodrine 5  MG tablet Commonly known as: PROAMATINE Take 5 mg by mouth 3 (three) times daily.   rosuvastatin 10 MG tablet Commonly known as: CRESTOR Take 10 mg by mouth at bedtime.   senna-docusate 8.6-50 MG tablet Commonly known as: Senokot-S Take 1 tablet by mouth at bedtime as needed for mild constipation.   sertraline 100 MG tablet Commonly known as: ZOLOFT Take 100 mg by mouth daily.   zinc gluconate 50 MG tablet Take 50 mg by mouth daily.        Follow-up Information     Earnestine Leys, MD Follow up in 2 week(s).   Specialty: Orthopedic Surgery Why: For suture removal Please make appointment for patient prior to discharge Contact information: [place] [address] [phone]                Discharge Exam: Danley Danker Weights   03/28/22 1859 03/31/22 0500 04/02/22 0500  Weight: 53.1 kg 53.9 kg 53.8 kg   General exam: Appears calm and comfortable  Respiratory system: Clear to auscultation. Respiratory effort normal. Cardiovascular system: S1 & S2 heard, RRR. No JVD,  Gastrointestinal system: Abdomen is nondistended, soft and nontender. Central nervous system: Alert and oriented. No focal neurological deficits. Extremities: Symmetric 5 x 5 power. Skin: No rashes, lesions or ulcers Psychiatry:  Mood & affect appropriate.    Condition at discharge: fair  The results of significant diagnostics from this hospitalization (including imaging, microbiology, ancillary and laboratory) are listed below for reference.   Imaging Studies: DG HIP UNILAT WITH PELVIS 2-3 VIEWS LEFT  Result Date: 03/29/2022 CLINICAL DATA:  Left hip pinning. EXAM: DG HIP (WITH OR WITHOUT PELVIS) 2-3V LEFT COMPARISON:  03/28/2022 FINDINGS: 2 intraoperative spot fluoro films obtained after placement of 4 cannulated compression screws in the proximal left femur. IMPRESSION: Intraoperative spot fluoro films during left hip pinning for subcapital femoral neck fracture. No evidence for  immediate hardware complication. Electronically Signed   By: Misty Stanley M.D.   On: 03/29/2022 12:12   DG C-Arm 1-60 Min-No Report  Result Date: 03/29/2022 Fluoroscopy was utilized by the requesting physician.  No radiographic interpretation.   DG Chest 1 View  Result Date: 03/28/2022 CLINICAL DATA:  Fall, left hip fracture EXAM: CHEST  1 VIEW COMPARISON:  None Available. FINDINGS: Lungs are well expanded, symmetric, and clear. No pneumothorax or pleural effusion. Cardiac size within normal limits. Coronary artery stenting noted. Pulmonary vascularity is normal. Osseous structures are age-appropriate. No acute bone abnormality. IMPRESSION: No active disease. Electronically Signed   By: Fidela Salisbury M.D.   On: 03/28/2022 19:49   DG Knee Complete 4 Views Left  Result Date: 03/28/2022 CLINICAL DATA:  Fall, left knee pain EXAM: LEFT KNEE - COMPLETE 4+ VIEW COMPARISON:  None Available. FINDINGS: Normal alignment. No acute fracture or dislocation. Joint spaces are not  optimally profiled but appear preserved. No effusion. Vascular calcifications are noted. IMPRESSION: 1. No acute fracture or dislocation. Electronically Signed   By: [place]idela Salisbury M.D.   On: 03/28/2022 19:49   DG Hip Unilat W or Wo Pelvis 2-3 Views Left  Result Date: 03/28/2022 CLINICAL DATA:  Fall, pelvic pain, left hip pain EXAM: DG HIP (WITH OR WITHOUT PELVIS) 2-3V LEFT COMPARISON:  None Available. FINDINGS: There is an acute, mildly impacted, anatomically aligned subcapital left femoral neck fracture. Femoral head is still seated within the left acetabulum and left hip joint space appears preserved. Mild superimposed left hip degenerative arthritis. Right hip ORIF has been performed with 3 partially threaded screws. Mild right hip degenerative arthritis. Pelvis appears intact. Vascular calcifications noted. IMPRESSION: 1. Acute, mildly impacted, anatomically aligned subcapital left femoral neck fracture. Electronically Signed    By: Fidela Salisbury M.D.   On: 03/28/2022 19:48    Microbiology: Results for orders placed or performed during the hospital encounter of 10/10/21  SARS Coronavirus 2 by RT PCR (hospital order, performed in [facility] hospital lab) *cepheid single result test* Anterior Nasal Swab     Status: None   Collection Time: 10/10/21 11:26 PM   Specimen: Anterior Nasal Swab  Result Value Ref Range Status   SARS Coronavirus 2 by RT PCR NEGATIVE NEGATIVE Final    Comment: (NOTE) SARS-CoV-2 target nucleic acids are NOT DETECTED.  The SARS-CoV-2 RNA is generally detectable in upper and lower respiratory specimens during the acute phase of infection. The lowest concentration of SARS-CoV-2 viral copies this assay can detect is 250 copies / mL. A negative result does not preclude SARS-CoV-2 infection and should not be used as the sole basis for treatment or other patient management decisions.  A negative result may occur with improper specimen collection / handling, submission of specimen other than nasopharyngeal swab, presence of viral mutation(s) within the areas targeted by this assay, and inadequate number of viral copies (<250 copies / mL). A negative result must be combined with clinical observations, patient history, and epidemiological information.  Fact Sheet for Patients:   https://www.patel.info/  Fact Sheet for Healthcare Providers: https://hall.com/  This test is not yet approved or  cleared by the [place] FDA and has been authorized for detection and/or diagnosis of SARS-CoV-2 by FDA under an Emergency Use Authorization (EUA).  This EUA will remain in effect (meaning this test can be used) for the duration of the COVID-19 declaration under Section 564(b)(1) of the Act, 21 U.S.C. section 360bbb-3(b)(1), unless the authorization is terminated or revoked sooner.  Performed at [facility] [address], [place], [place]  [address]   Urine Culture     Status: Abnormal   Collection Time: 10/12/21  8:25 AM   Specimen: Urine, Catheterized  Result Value Ref Range Status   Specimen Description   Final    URINE, CATHETERIZED Performed at [facility], [address] [place], [place] [address]    Special Requests   Final    NONE Performed at [facility], [address] [place], [place] [address]    Culture >=100,000 COLONIES/mL ESCHERICHIA COLI (A)  Final   Report Status 10/15/2021 FINAL  Final   Organism ID, Bacteria ESCHERICHIA COLI (A)  Final      Susceptibility   Escherichia coli - MIC*    AMPICILLIN >=32 RESISTANT Resistant     CEFAZOLIN 8 SENSITIVE Sensitive     CEFEPIME <=0.12 SENSITIVE Sensitive     CEFTRIAXONE <=  0.25 SENSITIVE Sensitive     CIPROFLOXACIN <=0.25 SENSITIVE Sensitive     GENTAMICIN >=16 RESISTANT Resistant     IMIPENEM <=0.25 SENSITIVE Sensitive     NITROFURANTOIN <=16 SENSITIVE Sensitive     TRIMETH/SULFA >=320 RESISTANT Resistant     AMPICILLIN/SULBACTAM >=32 RESISTANT Resistant     PIP/TAZO 64 INTERMEDIATE Intermediate     * >=100,000 COLONIES/mL ESCHERICHIA COLI    Labs: CBC: Recent Labs  Lab 03/28/22 2139 03/29/22 0520 03/30/22 0516 03/31/22 0359 04/01/22 0355 04/02/22 0525  WBC 8.9 5.6 7.8 8.8 6.2 4.5  NEUTROABS 7.1  --   --   --   --  3.0  HGB 11.2* 9.9* 9.8* 9.3* 8.9* 9.0*  HCT 35.0* 31.2* 30.9* 28.9* 27.5* 27.6*  MCV 97.0 98.1 97.5 96.3 95.2 96.2  PLT 201 159 167 160 185 409   Basic Metabolic Panel: Recent Labs  Lab 03/29/22 0520 03/30/22 0516 03/31/22 0359 04/01/22 0355 04/02/22 0525  NA 143 140 136 140 141  K 3.7 3.7 3.9 3.8 3.4*  CL 110 107 103 106 105  CO2 27 25 19* 28 27  GLUCOSE 110* 124* 317* 126* 154*  BUN '16 14 20 18 19  '$ CREATININE 0.83 0.73 1.02* 0.75 0.71  CALCIUM 8.3* 8.2* 8.2* 8.4* 8.7*  MG  --  1.8 1.8 1.9 1.9   Liver Function Tests: Recent Labs  Lab 03/28/22 2139  AST 26  ALT 17  ALKPHOS 58  BILITOT  1.1  PROT 6.6  ALBUMIN 3.7   CBG: Recent Labs  Lab 04/01/22 1647 04/01/22 2229 04/02/22 0258 04/02/22 0803 04/02/22 0814  GLUCAP 143* 196* 141* 157* 165*    Discharge time spent: 36 MINUTES.   Signed: Hosie Poisson, MD [facility] 04/02/2022

## 2022-04-02 NOTE — Plan of Care (Signed)
  Problem: Education: Goal: Ability to describe self-care measures that may prevent or decrease complications (Diabetes Survival Skills Education) will improve Outcome: Progressing   Problem: Fluid Volume: Goal: Ability to maintain a balanced intake and output will improve Outcome: Progressing   Problem: Health Behavior/Discharge Planning: Goal: Ability to identify and utilize available resources and services will improve Outcome: Progressing   Problem: Nutritional: Goal: Maintenance of adequate nutrition will improve Outcome: Progressing

## 2022-04-03 DIAGNOSIS — I959 Hypotension, unspecified: Secondary | ICD-10-CM

## 2022-04-03 LAB — COMPREHENSIVE METABOLIC PANEL
ALT: 17 U/L (ref 0–44)
AST: 30 U/L (ref 15–41)
Albumin: 2.6 g/dL — ABNORMAL LOW (ref 3.5–5.0)
Alkaline Phosphatase: 53 U/L (ref 38–126)
Anion gap: 9 (ref 5–15)
BUN: 14 mg/dL (ref 8–23)
CO2: 29 mmol/L (ref 22–32)
Calcium: 8.5 mg/dL — ABNORMAL LOW (ref 8.9–10.3)
Chloride: 101 mmol/L (ref 98–111)
Creatinine, Ser: 0.76 mg/dL (ref 0.44–1.00)
GFR, Estimated: >60 mL/min (ref >60)
Glucose, Bld: 175 mg/dL — ABNORMAL HIGH (ref 70–99)
Potassium: 3.5 mmol/L (ref 3.5–5.1)
Sodium: 139 mmol/L (ref 135–145)
Total Bilirubin: 0.6 mg/dL (ref 0.3–1.2)
Total Protein: 5.4 g/dL — ABNORMAL LOW (ref 6.5–8.1)

## 2022-04-03 LAB — GLUCOSE, CAPILLARY
Glucose-Capillary: 166 mg/dL — ABNORMAL HIGH (ref 70–99)
Glucose-Capillary: 193 mg/dL — ABNORMAL HIGH (ref 70–99)
Glucose-Capillary: 300 mg/dL — ABNORMAL HIGH (ref 70–99)
Glucose-Capillary: 232 mg/dL — ABNORMAL HIGH (ref 70–99)
Glucose-Capillary: 100 mg/dL — ABNORMAL HIGH (ref 70–99)

## 2022-04-03 LAB — CBC WITH DIFFERENTIAL/PLATELET
Abs Immature Granulocytes: 0 10*3/uL (ref 0.00–0.07)
Basophils Absolute: 0 10*3/uL (ref 0.0–0.1)
Basophils Relative: 1 %
Eosinophils Absolute: 0.2 10*3/uL (ref 0.0–0.5)
Eosinophils Relative: 4 %
HCT: 26 % — ABNORMAL LOW (ref 36.0–46.0)
Hemoglobin: 8.7 g/dL — ABNORMAL LOW (ref 12.0–15.0)
Immature Granulocytes: 0 %
Lymphocytes Relative: 24 %
Lymphs Abs: 0.9 10*3/uL (ref 0.7–4.0)
MCH: 31.8 pg (ref 26.0–34.0)
MCHC: 33.5 g/dL (ref 30.0–36.0)
MCV: 94.9 fL (ref 80.0–100.0)
Monocytes Absolute: 0.4 10*3/uL (ref 0.1–1.0)
Monocytes Relative: 10 %
Neutro Abs: 2.3 10*3/uL (ref 1.7–7.7)
Neutrophils Relative %: 61 %
Platelets: 199 10*3/uL (ref 150–400)
RBC: 2.74 MIL/uL — ABNORMAL LOW (ref 3.87–5.11)
RDW: 13.8 % (ref 11.5–15.5)
WBC: 3.8 10*3/uL — ABNORMAL LOW (ref 4.0–10.5)
nRBC: 0 % (ref 0.0–0.2)

## 2022-04-03 LAB — HEMOGLOBIN A1C
Hgb A1c MFr Bld: 7.2 % — ABNORMAL HIGH (ref 4.8–5.6)
Mean Plasma Glucose: 160 mg/dL

## 2022-04-03 LAB — MAGNESIUM: Magnesium: 1.7 mg/dL (ref 1.7–2.4)

## 2022-04-03 MED ORDER — PROCHLORPERAZINE EDISYLATE 10 MG/2ML IJ SOLN
10.0000 mg | Freq: Every day | INTRAMUSCULAR | Status: DC
  Administered 2022-04-07 – 2022-04-22 (×13): 10 mg via INTRAVENOUS
  Filled 2022-04-03 (×21): qty 2

## 2022-04-03 MED ORDER — CYANOCOBALAMIN 1000 MCG/ML IJ SOLN
1000.0000 ug | Freq: Once | INTRAMUSCULAR | Status: AC
  Administered 2022-04-03: 1000 ug via INTRAMUSCULAR
  Filled 2022-04-03: qty 1

## 2022-04-03 MED ORDER — SENNA 8.6 MG PO TABS
2.0000 | ORAL_TABLET | Freq: Every day | ORAL | Status: DC
  Administered 2022-04-03 – 2022-04-09 (×7): 17.2 mg via ORAL
  Filled 2022-04-03 (×7): qty 2

## 2022-04-03 MED ORDER — MAGNESIUM SULFATE 2 GM/50ML IV SOLN
2.0000 g | Freq: Once | INTRAVENOUS | Status: AC
  Administered 2022-04-03: 2 g via INTRAVENOUS
  Filled 2022-04-03: qty 50

## 2022-04-03 MED ORDER — DOCUSATE SODIUM 100 MG PO CAPS
100.0000 mg | ORAL_CAPSULE | Freq: Every day | ORAL | Status: DC
  Administered 2022-04-03 – 2022-04-10 (×8): 100 mg via ORAL
  Filled 2022-04-03 (×8): qty 1

## 2022-04-03 NOTE — Progress Notes (Signed)
Inpatient Rehabilitation  Patient information reviewed and entered into eRehab system by Lashaundra Lehrmann Sarayu Prevost, OTR/L, Rehab Quality Coordinator.   Information including medical coding, functional ability and quality indicators will be reviewed and updated through discharge.   

## 2022-04-03 NOTE — Progress Notes (Signed)
Patient blood sugar was taken before lunch and was 300, patient gave herself 1.9 units of insulin through insulin pump.

## 2022-04-03 NOTE — Progress Notes (Signed)
Patient Blood sugar was taken before dinner and was 196, patient gave herself 5.2 units of insulin through insulin pump.

## 2022-04-03 NOTE — Progress Notes (Signed)
[place] [place] Individual Statement of Services  Patient Name:  Diana Frost  Date:  04/03/2022  Welcome to the [facility].  Our goal is to provide you with an individualized program based on your diagnosis and situation, designed to meet your specific needs.  With this comprehensive rehabilitation program, you will be expected to participate in at least 3 hours of rehabilitation therapies Monday-Friday, with modified therapy programming on the weekends.  Your rehabilitation program will include the following services:  Physical Therapy (PT), Occupational Therapy (OT), Speech Therapy (ST), 24 hour per day rehabilitation nursing, Therapeutic Recreaction (TR), Neuropsychology, Care Coordinator, Rehabilitation Medicine, Nutrition Services, Pharmacy Services, and Other  Weekly team conferences will be held on Wednesdays to discuss your progress.  Your Inpatient Rehabilitation Care Coordinator will talk with you frequently to get your input and to update you on team discussions.  Team conferences with you and your family in attendance may also be held.  Expected length of stay: 12-14 Days  Overall anticipated outcome:  Supervision-MOD I  Depending on your progress and recovery, your program may change. Your Inpatient Rehabilitation Care Coordinator will coordinate services and will keep you informed of any changes. Your Inpatient Rehabilitation Care Coordinator's name and contact numbers are listed  below.  The following services may also be recommended but are not provided by the [facility]:   [place] will be made to provide these services after discharge if needed.  Arrangements include referral to agencies that provide these services.  Your insurance has been verified to be:  Medicare A&B Your primary doctor is:  Thornton Dales, NP  Pertinent  information will be shared with your doctor and your insurance company.  Inpatient Rehabilitation Care Coordinator:  Erlene Quan, [place] or [phone]  Information discussed with and copy given to patient by: [name], 04/03/2022, 9:49 AM

## 2022-04-03 NOTE — Progress Notes (Signed)
Physical Therapy Session Note  Patient Details  Name: Diana Frost MRN: [id number] Date of Birth: [birth year]  Today's Date: 04/03/2022 PT Individual Time: 1300-1355 PT Individual Time Calculation (min): 55 min   Short Term Goals: Week 1:  PT Short Term Goal 1 (Week 1): pt will perform bed mobility with CGA overall PT Short Term Goal 2 (Week 1): pt will transfer sit<>stand with LRAD and CGA PT Short Term Goal 3 (Week 1): pt will initate gait training with RW  Skilled Therapeutic Interventions/Progress Updates:   Received pt semi-reclined in bed finishing lunch. Pt agreeable to PT treatment and denied any pain at rest but reported pain increases to 8/10 described as "sharp" and "jabbing" when standing (premedicated). Session with emphasis on functional mobility/transfers, toileting, generalized strengthening and endurance, and dynamic standing balance/coordination.   Pt requested to try to toilet and transferred semi-reclined<>sitting EOB with HOB elevated and use of bedrails with min A for LLE management. Donned non-skid socks and pt stood with RW and min A from elevated EOB and transferred onto bedside commode with RW and min A - however, pt unable to adhere to LLE TDWB precautions despite cues. Pt required max A to doff brief and despite increased time, pt unsuccessful. Due to pain and fatigue, pt requested to work on bed level exercises and stood from bedside commode with RW and min A and transferred back to bed in same manner. Pt performed LAQ x10 bilaterally and hip flexion x10 bilaterally while sitting EOB. Returned to supine with min A for LLE management and pulled on bedrails to scoot to [facility]. Pt then performed the following exercises with emphasis on LE strength/ROM: -ankle circles x20 clockwise/counterclockwise bilaterally -SLR x10 bilaterally - AAROM on LLE -hip abduction x10 bilaterally  -hip adduction pillow squzzes x10 with 5 second hold Concluded session with pt semi-reclined  in bed, needs within reach, and bed alarm on.   Therapy Documentation Precautions:  Precautions Precautions: Fall Restrictions Weight Bearing Restrictions: Yes LLE Weight Bearing: Touchdown weight bearing  Therapy/Group: Individual Therapy Alfonse Alpers PT, DPT  04/03/2022, 6:59 AM

## 2022-04-03 NOTE — Progress Notes (Signed)
Inpatient Rehabilitation Care Coordinator Assessment and Plan Patient Details  Name: Diana Frost MRN: [id number] Date of Birth: [birth year]  Today's Date: 04/03/2022  Hospital Problems: Principal Problem:   Femur fracture, left [facility])  Past Medical History:  Past Medical History:  Diagnosis Date   Coronary artery disease    Diabetes mellitus without complication (HCC)    insulin pump   Headache    since ear problem started   Hypertension    Myocardial infarction ([place]) 11/01/13   Past Surgical History:  Past Surgical History:  Procedure Laterality Date   ABDOMINAL HYSTERECTOMY     BACK SURGERY     tail bone removed after fall/fracture   CATARACT EXTRACTION BILATERAL W/ ANTERIOR VITRECTOMY     CORONARY ANGIOPLASTY WITH STENT PLACEMENT  11/01/2013   [facility], stent x2   EYE SURGERY     HIP PINNING,CANNULATED Right 01/07/2019   Procedure: CANNULATED HIP PINNING;  Surgeon: Corky Mull, MD;  Location: [facility] ORS;  Service: Orthopedics;  Laterality: Right;   HIP PINNING,CANNULATED Left 03/29/2022   Procedure: PERCUTANEOUS FIXATION OF FEMORAL NECK;  Surgeon: Earnestine Leys, MD;  Location: [facility] ORS;  Service: Orthopedics;  Laterality: Left;   LEFT HEART CATH AND CORONARY ANGIOGRAPHY N/A 08/14/2016   Procedure: Left Heart Cath and Coronary Angiography;  Surgeon: Minna Merritts, MD;  Location: [facility] CV LAB;  Service: Cardiovascular;  Laterality: N/A;   MELANOMA EXCISION Right    arm with skin graft   MYRINGOTOMY WITH TUBE PLACEMENT Left 08/21/2015   Procedure: MYRINGOTOMY WITH TUBE PLACEMENT;  Surgeon: Carloyn Manner, MD;  Location: [facility];  Service: ENT;  Laterality: Left;   NASOPHARYNGEAL BIOPSY N/A 11/07/2015   Procedure: NASOPHARYNGEAL BIOPSY;  Surgeon: Carloyn Manner, MD;  Location: [facility] ORS;  Service: ENT;  Laterality: N/A;   TONSILLECTOMY     Social History:  reports that she quit smoking about 23 years ago. Her smoking use included  cigarettes. She has never used smokeless tobacco. She reports current alcohol use of about 5.0 standard drinks of alcohol per week. She reports that she does not use drugs.  Family / Support Systems Patient Roles: Spouse Children: Diana Frost (Daughter) Anticipated Caregiver: Daughter and hired caregivers Ability/Limitations of Caregiver: N/A Caregiver Availability: 24/7 Family Dynamics: support from daughter  Social History Preferred language: English Religion: None Health Literacy - How often do you need to have someone help you when you read instructions, pamphlets, or other written material from your doctor or pharmacy?: Never Writes: Yes   Abuse/Neglect Abuse/Neglect Assessment Can Be Completed: Yes Physical Abuse: Denies Verbal Abuse: Denies Sexual Abuse: Denies Exploitation of patient/patient's resources: Denies Self-Neglect: Denies  Patient response to: Social Isolation - How often do you feel lonely or isolated from those around you?: Never  Emotional Status Recent Psychosocial Issues: coping  Patient / Family Perceptions, Expectations & Goals Pt/Family understanding of illness & functional limitations: yes Premorbid pt/family roles/activities: MOD I with RW. Driving and independent in the community Anticipated changes in roles/activities/participation: Patient daughter anticipates assisting both parents at discharge and hiring caregiver assistance Pt/family expectations/goals: Supervision to [place]: None Premorbid Home Care/DME Agencies: Other (Comment) (Rolling walker, Rollator, [facility]PC) Transportation available at discharge: daughter Is the patient able to respond to transportation needs?: Yes In the past 12 months, has lack of transportation kept you from medical appointments or from getting medications?: No In the past 12 months, has lack of transportation kept you from meetings, work, or from getting  things needed for daily  living?: No Resource referrals recommended: Neuropsychology  Discharge Planning Living Arrangements: Spouse/significant other Support Systems: Children Type of Residence: Private residence Insurance Resources: [employer] Financial Resources: Family Support, Social Security Financial Screen Referred: No Living Expenses: Own Money Management: Patient Does the patient have any problems obtaining your medications?: No Home Management: Independent Patient/Family Preliminary Plans: Daughter able to assist as needed Care Coordinator Barriers to Discharge: Lack of/limited family support, Decreased caregiver support, Home environment access/layout DC Planning Additional Notes/Comments: Patient's spouse is bedbound due to a CVA. Daughter anticipating assist both at discharge in addition to hired assistance. Expected length of stay: 12-14 Days  Clinical Impression SW met with patient and daughter, Diana Frost in room to introduce self, explain role and address questions or concerns. Patient discharging home with assistance from daughter and hired caregiver assistance. Patient's spouse is bed bound due to a CVA and has hired assistance to care in home. Daughter has to be home before 2 PM to dismiss caregivers. SW will provide daughter with updates between 11:30-12:00 PM. Ramp entrance on the home. No additional questions or concerns.  [name] 04/03/2022, 2:11 PM

## 2022-04-03 NOTE — Evaluation (Signed)
Physical Therapy Assessment and Plan  Patient Details  Name: Diana Frost MRN: [id number] Date of Birth: 07-Aug-1945  PT Diagnosis: Abnormal posture, Abnormality of gait, Cognitive deficits, Difficulty walking, Impaired cognition, and Muscle weakness Rehab Potential: Good ELOS: 2 weeks   Today's Date: 04/03/2022 PT Individual Time: 2993-7169 PT Individual Time Calculation (min): 59 min    Hospital Problem: Principal Problem:   Femur fracture, left ([place])   Past Medical History:  Past Medical History:  Diagnosis Date   Coronary artery disease    Diabetes mellitus without complication ([place])    insulin pump   Headache    since ear problem started   Hypertension    Myocardial infarction ([place]) 11/01/13   Past Surgical History:  Past Surgical History:  Procedure Laterality Date   ABDOMINAL HYSTERECTOMY     BACK SURGERY     tail bone removed after fall/fracture   CATARACT EXTRACTION BILATERAL W/ ANTERIOR VITRECTOMY     CORONARY ANGIOPLASTY WITH STENT PLACEMENT  11/01/2013   [facility], stent x2   EYE SURGERY     HIP PINNING,CANNULATED Right 01/07/2019   Procedure: CANNULATED HIP PINNING;  Surgeon: Corky Mull, MD;  Location: [facility] ORS;  Service: Orthopedics;  Laterality: Right;   HIP PINNING,CANNULATED Left 03/29/2022   Procedure: PERCUTANEOUS FIXATION OF FEMORAL NECK;  Surgeon: Earnestine Leys, MD;  Location: [facility] OR[facility];  Service: Orthopedics;  Laterality: Left;   LEFT HEART CATH AND CORONARY ANGIOGRAPHY N/A 08/14/2016   Procedure: Left Heart Cath and Coronary Angiography;  Surgeon: Minna Merritts, MD;  Location: [facility];  Service: Cardiovascular;  Laterality: N/A;   MELANOMA EXCISION Right    arm with skin graft   MYRINGOTOMY WITH TUBE PLACEMENT Left 08/21/2015   Procedure: MYRINGOTOMY WITH TUBE PLACEMENT;  Surgeon: Carloyn Manner, MD;  Location: [facility];  Service: ENT;  Laterality: Left;   NASOPHARYNGEAL BIOPSY N/A 11/07/2015   Procedure:  NASOPHARYNGEAL BIOPSY;  Surgeon: Carloyn Manner, MD;  Location: [facility] OR[facility];  Service: ENT;  Laterality: N/A;   TONSILLECTOMY      Assessment & Plan Clinical Impression: Patient is a 76 y.o. year old female with history of T1DM on insulin pump, CAD, HTN, multiple falls w/gait disorder;  who was admitted to [facility] on 03/28/22 after a mechanical fall with onset of left hip pain and inability to walk. She was found to have left non-displaced subcapital hip fracture and underwent percutaneous fixation of femoral neck by Dr. Earnestine Leys on 03/29/22. Post op to be TTWB and on Lovenox for DVT prophylaxis. She did have drop in K to 3.4 today which was supplemented and ABLA being monitored. Post op with pain, reports of confusion, difficulty with WB restrictions as well as weakness and fatigue. She reports he pain is under control. She reports she had a BM today.  She takes midodrine for chronic hypotension. CIR recommended due to functional decline.      Patient currently requires mod with mobility secondary to muscle weakness and muscle joint tightness, decreased cardiorespiratoy endurance, decreased awareness and decreased memory, and decreased standing balance, decreased postural control, decreased balance strategies, and difficulty maintaining precautions.  Prior to hospitalization, patient was modified independent  with mobility and lived with Spouse in a House home.  Home access is  Ramped entrance.  Patient will benefit from skilled PT intervention to maximize safe functional mobility, minimize fall risk, and decrease caregiver burden for planned discharge home with intermittent assist.  Anticipate patient will benefit from follow up  HH at discharge.  PT - End of Session Activity Tolerance: Tolerates 30+ min activity with multiple rests Endurance Deficit: Yes Endurance Deficit Description: Frequent rest breaks needed PT Assessment Rehab Potential (ACUTE/IP ONLY): Good PT Barriers to Discharge:  Decreased caregiver support;Wound Care;Incontinence;Weight bearing restrictions PT Barriers to Discharge Comments: pain, LLE TDWB precautions, primary caregiver for husband who is bed bound PT Patient demonstrates impairments in the following area(s): Balance;Edema;Endurance;Motor;Pain;Skin Integrity PT Transfers Functional Problem(s): Bed Mobility;Bed to Chair;Car;Furniture PT Locomotion Functional Problem(s): Ambulation;Wheelchair Mobility;Stairs PT Plan PT Intensity: Minimum of 1-2 x/day ,45 to 90 minutes PT Frequency: 5 out of 7 days PT Duration Estimated Length of Stay: 2 weeks PT Treatment/Interventions: Ambulation/gait training;Community reintegration;DME/adaptive equipment instruction;Neuromuscular re-education;Psychosocial support;Stair training;UE/LE Strength taining/ROM;Wheelchair propulsion/positioning;Balance/vestibular training;Discharge planning;Pain management;Skin care/wound management;Therapeutic Activities;UE/LE Coordination activities;Cognitive remediation/compensation;Disease management/prevention;Functional mobility training;Patient/family education;Therapeutic Exercise;Visual/perceptual remediation/compensation PT Transfers Anticipated Outcome(s): Mod I with LRAD PT Locomotion Anticipated Outcome(s): supervision with LRAD PT Recommendation Recommendations for Other Services: Speech consult;Therapeutic Recreation consult Therapeutic Recreation Interventions: Pet therapy;Stress management Follow Up Recommendations: Home health PT Patient destination: Home Equipment Recommended: To be determined Equipment Details: has RW and rollator   PT Evaluation Precautions/Restrictions Precautions Precautions: Fall Restrictions Weight Bearing Restrictions: Yes LLE Weight Bearing: Touchdown weight bearing Pain Interference Pain Interference Pain Effect on Sleep: 1. Rarely or not at all Pain Interference with Therapy Activities: 1. Rarely or not at all Pain Interference with  Day-to-Day Activities: 1. Rarely or not at all Home Living/Prior [place] Available Help at Discharge: Family;Available PRN/intermittently (per daughter pt has aide every day twice per day to assist in caring for pt's husband) Type of Home: House Home Access: Ramped entrance Home Layout: Two level;Able to live on main level with bedroom/bathroom;Full bath on main level Bathroom Shower/Tub: Walk-in shower;Tub/shower unit;Door;Other (comment) (swinging door) Bathroom Toilet: Handicapped height Bathroom Accessibility: Yes Additional Comments: pt reports using rollator primarily in home and RW outside the home. Per daughter, pt has cognitive deficits and would benefit from speech therapy.  Lives With: Spouse Prior Function Level of Independence: Requires assistive device for independence  Able to Take Stairs?: No Driving: Yes Vision/Perception  Vision - History Ability to See in Adequate Light: 0 Adequate Perception Perception: Within Functional Limits Praxis Praxis: Intact  Cognition Overall Cognitive Status: Impaired/Different from baseline (per daughter pt reports pt is different than at baseline with request of SLP eval) Arousal/Alertness: Lethargic Orientation Level: Oriented X4 Memory: Impaired Awareness: Appears intact Problem Solving: Impaired Safety/Judgment: Appears intact Comments: poor adherance to LLE TDWB precautions Sensation Sensation Light Touch: Appears Intact Proprioception: Appears Intact Coordination Gross Motor Movements are Fluid and Coordinated: No Fine Motor Movements are Fluid and Coordinated: Yes Coordination and Movement Description: grossly uncoordinated due to LLE TDWB precautions, decreased balance/coordination, fatigue, and generalized weakness/deconditioning Finger Nose Finger Test: [facility] but slow Heel Shin Test: WFL on RLE, decreased ROM and slower on LLE Motor  Motor Motor: Other (comment) Motor - Skilled Clinical Observations:  grossly uncoordinated due to LLE TDWB precautions, decreased balance/coordination, fatigue, and generalized weakness/deconditioning  Trunk/Postural Assessment  Cervical Assessment Cervical Assessment: Exceptions to [facility] (forward head) Thoracic Assessment Thoracic Assessment: Exceptions to [facility] (rounded shoulders) Lumbar Assessment Lumbar Assessment: Exceptions to [facility] (posterior pelvic tilt) Postural Control Postural Control: Deficits on evaluation Righting Reactions: altered due to LLE TDWB precautions  Balance Balance Balance Assessed: Yes Static Sitting Balance Static Sitting - Balance Support: Feet supported;Bilateral upper extremity supported Static Sitting - Level of Assistance: 5: Stand by assistance (supervision) Dynamic Sitting Balance Dynamic Sitting -  Balance Support: Feet supported;No upper extremity supported Dynamic Sitting - Level of Assistance: 5: Stand by assistance (supervision) Static Standing Balance Static Standing - Balance Support: Bilateral upper extremity supported;During functional activity (RW) Static Standing - Level of Assistance: 4: Min assist Dynamic Standing Balance Dynamic Standing - Balance Support: Bilateral upper extremity supported;During functional activity (RW) Dynamic Standing - Level of Assistance: 3: Mod assist Dynamic Standing - Comments: with transfers Extremity Assessment  RLE Assessment RLE Assessment: Exceptions to [facility] RLE Strength Right Hip Flexion: 4-/5 Right Hip ABduction: 3+/5 Right Hip ADduction: 3+/5 Right Knee Extension: 4-/5 Right Ankle Dorsiflexion: 4-/5 Right Ankle Plantar Flexion: 4/5 LLE Assessment LLE Assessment: Exceptions to [facility] LLE Strength Left Hip Flexion: 3-/5 Left Hip ABduction: 3/5 Left Hip ADduction: 3/5 Left Knee Extension: 3+/5 Left Ankle Dorsiflexion: 3+/5  Care Tool Care Tool Bed Mobility Roll left and right activity   Roll left and right assist level: Minimal Assistance - Patient > 75%    Sit to  lying activity        Lying to sitting on side of bed activity   Lying to sitting on side of bed assist level: the ability to move from lying on the back to sitting on the side of the bed with no back support.: Minimal Assistance - Patient > 75%     Care Tool Transfers Sit to stand transfer   Sit to stand assist level: Moderate Assistance - Patient 50 - 74%    Chair/bed transfer   Chair/bed transfer assist level: Moderate Assistance - Patient 50 - 74%     Toilet transfer   Assist Level: Moderate Assistance - Patient 50 - 74% (stand pivot)    Scientist, product/process development transfer activity did not occur: Safety/medical concerns (fatigue, poor adherance to TDWB precautions)        Care Tool Locomotion Ambulation Ambulation activity did not occur: Safety/medical concerns (fatigue, poor adherance to TDWB precautions)        Walk 10 feet activity Walk 10 feet activity did not occur: Safety/medical concerns (fatigue, poor adherance to TDWB precautions)       Walk 50 feet with 2 turns activity Walk 50 feet with 2 turns activity did not occur: Safety/medical concerns (fatigue, poor adherance to TDWB precautions)      Walk 150 feet activity Walk 150 feet activity did not occur: Safety/medical concerns (fatigue, poor adherance to TDWB precautions)      Walk 10 feet on uneven surfaces activity Walk 10 feet on uneven surfaces activity did not occur: Safety/medical concerns (fatigue, poor adherance to TDWB precautions)      Stairs Stair activity did not occur: Safety/medical concerns (fatigue, poor adherance to TDWB precautions)        Walk up/down 1 step activity Walk up/down 1 step or curb (drop down) activity did not occur: Safety/medical concerns (fatigue, poor adherance to TDWB precautions)      Walk up/down 4 steps activity Walk up/down 4 steps activity did not occur: Safety/medical concerns (fatigue, poor adherance to TDWB precautions)      Walk up/down 12 steps activity Walk up/down 12  steps activity did not occur: Safety/medical concerns (fatigue, poor adherance to TDWB precautions)      Pick up small objects from floor Pick up small object from the floor (from standing position) activity did not occur: Safety/medical concerns (fatigue, poor adherance to TDWB precautions)      Wheelchair Is the patient using a wheelchair?: Yes Type of Wheelchair: Manual   Wheelchair assist level:  Dependent - Patient 0% Max wheelchair distance: >130f  Wheel 50 feet with 2 turns activity   Assist Level: Dependent - Patient 0%  Wheel 150 feet activity   Assist Level: Dependent - Patient 0%    Refer to Care Plan for Long Term Goals  SHORT TERM GOAL WEEK 1 PT Short Term Goal 1 (Week 1): pt will perform bed mobility with CGA overall PT Short Term Goal 2 (Week 1): pt will transfer sit<>stand with LRAD and CGA PT Short Term Goal 3 (Week 1): pt will initate gait training with RW  Recommendations for other services: Therapeutic Recreation  Pet therapy and Stress management  Skilled Therapeutic Intervention Evaluation completed (see details above and below) with education on PT POC and goals and individual treatment initiated with focus on functional mobility/transfers, generalized strengthening and endurance, dynamic standing balance/coordination. Received pt semi-reclined in bed reported feeling lethargic from taking pain medication. Pt educated on PT evaluation, CIR policies, and therapy schedule and agreeable. Pt denied any pain at rest but reported increased "soreness" in L hip with mobility. Pt's daughter arrived during session. Located 18x16 cushion for WC in room and L elevating legrest for WC; although pt will need 16x16 chair. Pt rolled to R with min A and transferred R sidelying<>sitting EOB with min A for LLE management. Donned pants sitting EOB with max A and pt stood from EOB with RW and mod A and required total A to pull pant over hips. Pt performed stand<>pivot transfer into WC  with mod A and increased time. Pt with poor adherence to LLE TDWB precautions despite being able to verbalize them. [facility] and requested grounds pass from MD for daughter to take pt off unit. [facility]. Safety plan updated. MD notified of request for SLP consult.   Mobility Bed Mobility Bed Mobility: Rolling Right;Right Sidelying to Sit Rolling Right: Minimal Assistance - Patient > 75% Right Sidelying to Sit: Minimal Assistance - Patient > 75% Transfers Transfers: Sit to Stand;Stand to Sit;Stand Pivot Transfers Sit to Stand: Moderate Assistance - Patient 50-74% Stand to Sit: Minimal Assistance - Patient > 75% Stand Pivot Transfers: Moderate Assistance - Patient 50 - 74% Stand Pivot Transfer Details: Verbal cues for sequencing;Verbal cues for technique;Verbal cues for safe use of DME/AE;Verbal cues for precautions/safety Stand Pivot Transfer Details (indicate cue type and reason): verbal cues to use UEs to offweight LLE to adhere to precautions Transfer (Assistive device): Rolling walker Locomotion  Gait Ambulation: No Gait Gait: No Stairs / Additional Locomotion Stairs: No Wheelchair Mobility Wheelchair Mobility: No   Discharge Criteria: Patient will be discharged from PT if patient refuses treatment 3 consecutive times without medical reason, if treatment goals not met, if there is a change in medical status, if patient makes no progress towards goals or if patient is discharged from hospital.  The above assessment, treatment plan, treatment alternatives and goals were discussed and mutually agreed upon: by patient and by family  AAlfonse AlpersPT, DPT  04/03/2022, 12:22 PM

## 2022-04-03 NOTE — Evaluation (Signed)
Occupational Therapy Assessment and Plan  Patient Details  Name: Diana Frost MRN: [id number] Date of Birth: 05-27-1945  OT Diagnosis: abnormal posture, cognitive deficits, muscle weakness (generalized), and swelling of limb Rehab Potential: Rehab Potential (ACUTE ONLY): Good ELOS: 2 weeks   Today's Date: 04/03/2022 OT Individual Time: 1105-1200 OT Individual Time Calculation (min): 55 min     Hospital Problem: Principal Problem:   Femur fracture, left ([place])   Past Medical History:  Past Medical History:  Diagnosis Date   Coronary artery disease    Diabetes mellitus without complication (HCC)    insulin pump   Headache    since ear problem started   Hypertension    Myocardial infarction ([place]) 11/01/13   Past Surgical History:  Past Surgical History:  Procedure Laterality Date   ABDOMINAL HYSTERECTOMY     BACK SURGERY     tail bone removed after fall/fracture   CATARACT EXTRACTION BILATERAL W/ ANTERIOR VITRECTOMY     CORONARY ANGIOPLASTY WITH STENT PLACEMENT  11/01/2013   [facility], stent x2   EYE SURGERY     HIP PINNING,CANNULATED Right 01/07/2019   Procedure: CANNULATED HIP PINNING;  Surgeon: Corky Mull, MD;  Location: [facility];  Service: Orthopedics;  Laterality: Right;   HIP PINNING,CANNULATED Left 03/29/2022   Procedure: PERCUTANEOUS FIXATION OF FEMORAL NECK;  Surgeon: Earnestine Leys, MD;  Location: [facility];  Service: Orthopedics;  Laterality: Left;   LEFT HEART CATH AND CORONARY ANGIOGRAPHY N/A 08/14/2016   Procedure: Left Heart Cath and Coronary Angiography;  Surgeon: Minna Merritts, MD;  Location: [facility];  Service: Cardiovascular;  Laterality: N/A;   MELANOMA EXCISION Right    arm with skin graft   MYRINGOTOMY WITH TUBE PLACEMENT Left 08/21/2015   Procedure: MYRINGOTOMY WITH TUBE PLACEMENT;  Surgeon: Carloyn Manner, MD;  Location: [place];  Service: ENT;  Laterality: Left;   NASOPHARYNGEAL BIOPSY N/A 11/07/2015    Procedure: NASOPHARYNGEAL BIOPSY;  Surgeon: Carloyn Manner, MD;  Location: [facility];  Service: ENT;  Laterality: N/A;   TONSILLECTOMY      Assessment & Plan Clinical Impression:  LYNNETT Frost is a 76 year old female with history of T1DM on insulin pump, CAD, HTN, multiple falls w/gait disorder;  who was admitted to [facility] on 03/28/22 after a mechanical fall with onset of left hip pain and inability to walk. She was found to have left non-displaced subcapital hip fracture and underwent percutaneous fixation of femoral neck by Dr. Earnestine Leys on 03/29/22. Post op to be TTWB and on Lovenox for DVT prophylaxis. She did have drop in K to 3.4 today which was supplemented and ABLA being monitored. Post op with pain, reports of confusion, difficulty with WB restrictions as well as weakness and fatigue. She reports he pain is under control. She reports she had a BM today.  She takes midodrine for chronic hypotension. CIR recommended due to functional decline. Patient transferred to CIR on 04/02/2022 .    Patient currently requires max with basic self-care skills secondary to muscle weakness, decreased cardiorespiratoy endurance, decreased coordination, decreased awareness, decreased problem solving, and decreased memory, and decreased standing balance and difficulty maintaining precautions.  Prior to hospitalization, patient could complete all self-care with mod I.  Patient will benefit from skilled intervention to decrease level of assist with basic self-care skills and increase independence with basic self-care skills prior to discharge with husband who requires dependent care.  Anticipate patient will require intermittent supervision and follow up home  health.  OT - End of Session Activity Tolerance: Tolerates < 10 min activity, no significant change in vital signs Endurance Deficit: Yes Endurance Deficit Description: Frequent rest breaks needed OT Assessment Rehab Potential (ACUTE ONLY): Good OT  Barriers to Discharge: Incontinence;Weight bearing restrictions OT Patient demonstrates impairments in the following area(s): Balance;Cognition;Edema;Endurance;Pain;Motor;Nutrition OT Basic ADL's Functional Problem(s): Bathing;Dressing;Toileting OT Advanced ADL's Functional Problem(s): Simple Meal Preparation OT Transfers Functional Problem(s): Toilet;Tub/Shower OT Additional Impairment(s): None OT Plan OT Intensity: Minimum of 1-2 x/day, 45 to 90 minutes OT Frequency: 5 out of 7 days OT Duration/Estimated Length of Stay: 2 weeks OT Treatment/Interventions: Balance/vestibular training;Discharge planning;Self Care/advanced ADL retraining;Pain management;Therapeutic Activities;UE/LE Coordination activities;Cognitive remediation/compensation;Disease mangement/prevention;Functional mobility training;Patient/family education;Skin care/wound managment;Therapeutic Exercise;DME/adaptive equipment instruction;UE/LE Strength taining/ROM;Wheelchair propulsion/positioning;Psychosocial support OT Self Feeding Anticipated Outcome(s): Mod I OT Basic Self-Care Anticipated Outcome(s): Supervision to mod I OT Toileting Anticipated Outcome(s): Mod I OT Bathroom Transfers Anticipated Outcome(s): Mod I OT Recommendation Recommendations for Other Services: Neuropsych consult;Speech consult;Therapeutic Recreation consult Therapeutic Recreation Interventions: Pet therapy;Stress management Patient destination: Home Follow Up Recommendations: Home health OT Equipment Recommended: To be determined   OT Evaluation Precautions/Restrictions  Precautions Precautions: Fall Restrictions Weight Bearing Restrictions: Yes LLE Weight Bearing: Touchdown weight bearing Home Living/Prior Functioning Home Living Family/patient expects to be discharged to:: Skilled nursing facility Living Arrangements: Spouse/significant other Available Help at Discharge: Family, Available PRN/intermittently (per daughter pt has aide every  day twice per day to assist in caring for pt's husband) Type of Home: House Home Access: Ramped entrance Home Layout: Two level, Able to live on main level with bedroom/bathroom, Full bath on main level Bathroom Shower/Tub: Walk-in shower, Tub/shower unit, Door, Other (comment) (swinging door) Bathroom Toilet: Handicapped height Bathroom Accessibility: Yes Additional Comments: pt reports using rollator primarily in home and RW outside the home. Per daughter, pt has cognitive deficits and would benefit from speech therapy.  Lives With: Spouse IADL History Homemaking Responsibilities: Yes Meal Prep Responsibility: Primary Homemaking Comments: reports she was making meals herself but didn't enjoy it; daughter reports she had poor nutrition prior and would.like pt to grab her own meals especially with diabetic episodes Prior Function Level of Independence: Requires assistive device for independence  Able to Take Stairs?: No Driving: Yes Vision Baseline Vision/History: 1 Wears glasses Ability to See in Adequate Light: 0 Adequate Patient Visual Report: No change from baseline Vision Assessment?: No apparent visual deficits Perception  Perception: Within Functional Limits Praxis Praxis: Intact Cognition Cognition Overall Cognitive Status: Impaired/Different from baseline (per daughter pt reports pt is different than at baseline with request of SLP eval) Arousal/Alertness: Lethargic Orientation Level: Person;Place;Situation Person: Oriented Place: Oriented Situation: Oriented Memory: Impaired Awareness: Appears intact Problem Solving: Impaired Safety/Judgment: Appears intact Brief Interview for Mental Status (BIMS) Repetition of Three Words (First Attempt): 3 Temporal Orientation: Year: Correct Temporal Orientation: Month: Accurate within 5 days Temporal Orientation: Day: Correct Recall: "Sock": Yes, after cueing ("something to wear") Recall: "Blue": Yes, no cue required Recall:  "Bed": Yes, no cue required BIMS Summary Score: 14 Sensation Sensation Light Touch: Appears Intact Proprioception: Appears Intact Coordination Gross Motor Movements are Fluid and Coordinated: No Fine Motor Movements are Fluid and Coordinated: Yes Heel Shin Test: WFL on RLE, decreased ROM and slower on LLE Motor  Motor Motor: Other (comment) Motor - Skilled Clinical Observations: grossly uncoordinated due to LLE TDWB precautions, decreased balance/coordination, fatigue, and generalized weakness/deconditioning  Trunk/Postural Assessment  Cervical Assessment Cervical Assessment: Exceptions to [facility] (forward head) Thoracic Assessment Thoracic Assessment: Exceptions to [facility] (rounded  shoulders) Lumbar Assessment Lumbar Assessment: Exceptions to [facility] (posterior pelvic tilt) Postural Control Postural Control: Deficits on evaluation Righting Reactions: altered due to LLE TDWB precautions  Balance Balance Balance Assessed: Yes Static Sitting Balance Static Sitting - Balance Support: Feet supported;Bilateral upper extremity supported Static Sitting - Level of Assistance: 5: Stand by assistance (supervision) Dynamic Sitting Balance Dynamic Sitting - Balance Support: Feet supported;No upper extremity supported Dynamic Sitting - Level of Assistance: 5: Stand by assistance (supervision) Static Standing Balance Static Standing - Balance Support: Bilateral upper extremity supported;During functional activity (RW) Static Standing - Level of Assistance: 4: Min assist Dynamic Standing Balance Dynamic Standing - Balance Support: Bilateral upper extremity supported;During functional activity (RW) Dynamic Standing - Level of Assistance: 3: Mod assist Dynamic Standing - Comments: with transfers Extremity/Trunk Assessment RUE Assessment RUE Assessment: Exceptions to [facility] Active Range of Motion (AROM) Comments: WFL General Strength Comments: 4-/5 shoulder flexion LUE Assessment LUE Assessment:  Exceptions to [facility] Active Range of Motion (AROM) Comments: [facility]  Care Tool Care Tool Self Care Eating   Eating Assist Level: Set up assist    Oral Care    Oral Care Assist Level: Set up assist    Bathing   Body parts bathed by patient: Right arm;Left arm;Chest;Abdomen;Right upper leg;Left upper leg;Face Body parts bathed by helper: Front perineal area;Buttocks;Right lower leg;Left lower leg   Assist Level: Maximal Assistance - Patient 24 - 49%    Upper Body Dressing(including orthotics)   What is the patient wearing?: Pull over shirt   Assist Level: Supervision/Verbal cueing    Lower Body Dressing (excluding footwear)   What is the patient wearing?: Incontinence brief;Pants Assist for lower body dressing: Total Assistance - Patient < 25%    Putting on/Taking off footwear   What is the patient wearing?: Non-skid slipper socks Assist for footwear: Dependent - Patient 0%       Care Tool Toileting Toileting activity   Assist for toileting: Total Assistance - Patient < 25%     Care Tool Bed Mobility Roll left and right activity   Roll left and right assist level: Minimal Assistance - Patient > 75%    Sit to lying activity        Lying to sitting on side of bed activity   Lying to sitting on side of bed assist level: the ability to move from lying on the back to sitting on the side of the bed with no back support.: Minimal Assistance - Patient > 75%     Care Tool Transfers Sit to stand transfer   Sit to stand assist level: Moderate Assistance - Patient 50 - 74%    Chair/bed transfer   Chair/bed transfer assist level: Moderate Assistance - Patient 50 - 74%     Toilet transfer   Assist Level: Moderate Assistance - Patient 50 - 74% (stand pivot)     Care Tool Cognition  Expression of Ideas and Wants Expression of Ideas and Wants: 4. Without difficulty (complex and basic) - expresses complex messages without difficulty and with speech that is clear and easy to  understand  Understanding Verbal and Non-Verbal Content Understanding Verbal and Non-Verbal Content: 3. Usually understands - understands most conversations, but misses some part/intent of message. Requires cues at times to understand   Memory/Recall Ability Memory/Recall Ability : Current season;That he or she is in a hospital/hospital unit   Refer to Care Plan for [place] OT Short Term Goal 1 (Week 1):  Pt will complete 1/3 toileting steps with CGA for standing balance using RW OT Short Term Goal 2 (Week 1): Pt will complete toilet transfer with CGA using RW OT Short Term Goal 3 (Week 1): Pt will use AE PRN during LB clothing with min cues needed for technique  Recommendations for other services: Neuropsych and Therapeutic Recreation  Pet therapy and Stress management   Skilled Therapeutic Intervention Patient received seated in w/c upon therapy arrival and agreeable to participate in OT evaluation with daughter present to assist with review of PLOF/PMH. Education provided on OT purpose, therapy schedule, goals for therapy, and safety policy while in rehab. MD in room for rounds with review of pt's insulin pump.  Patient demonstrates delayed processing, deficits with balance, strength, poor TTWB adherence on LLE resulting in difficulty completing BADL tasks without increased physical assist. Daughter also reports pt is more confused than baseline with SLP referral requested. Pt will benefit from skilled OT services to focus on mentioned deficits. See below for ADL and functional transfer performance. Pt remained supine in bed with direct care handoff to nursing for brief changing at conclusion of session with all needs met at end of session.  ADL ADL Eating: Set up Where Assessed-Eating: Wheelchair Grooming: Setup Where Assessed-Grooming: Wheelchair Upper Body Bathing: Supervision/safety Where Assessed-Upper Body Bathing: Wheelchair (simulated) Lower Body  Bathing: Maximal assistance (simulated) Where Assessed-Lower Body Bathing: Wheelchair Upper Body Dressing: Supervision/safety Where Assessed-Upper Body Dressing: Wheelchair Lower Body Dressing: Dependent (simulated) Where Assessed-Lower Body Dressing: Edge of bed Toileting: Dependent Where Assessed-Toileting: Bed level Toilet Transfer: Moderate assistance Toilet Transfer Method: Stand pivot Tub/Shower Transfer: Unable to assess Tub/Shower Transfer Method: Unable to assess Social research officer, government: Unable to assess Social research officer, government Method: Unable to assess Mobility  Bed Mobility Bed Mobility: Rolling Right;Right Sidelying to Sit Rolling Right: Minimal Assistance - Patient > 75% Right Sidelying to Sit: Minimal Assistance - Patient > 75% Transfers Sit to Stand: Moderate Assistance - Patient 50-74% Stand to Sit: Minimal Assistance - Patient > 75%   Discharge Criteria: Patient will be discharged from OT if patient refuses treatment 3 consecutive times without medical reason, if treatment goals not met, if there is a change in medical status, if patient makes no progress towards goals or if patient is discharged from hospital.  The above assessment, treatment plan, treatment alternatives and goals were discussed and mutually agreed upon: by patient and by family  Blase Mess, MS, OTR/L  04/03/2022, 12:18 PM

## 2022-04-03 NOTE — Progress Notes (Signed)
PROGRESS NOTE   Subjective/Complaints: +nausea, has been present since she had radiation treatment Able to manage her own insulin pump Denies pain, has not been using Norco  ROS: +nausea Objective:   No results found. Recent Labs    04/02/22 0525 04/03/22 0549  WBC 4.5 3.8*  HGB 9.0* 8.7*  HCT 27.6* 26.0*  PLT 199 199   Recent Labs    04/02/22 0525 04/03/22 0549  NA 141 139  K 3.4* 3.5  CL 105 101  CO2 27 29  GLUCOSE 154* 175*  BUN 19 14  CREATININE 0.71 0.76  CALCIUM 8.7* 8.5*   No intake or output data in the 24 hours ending 04/03/22 1512      Physical Exam: Vital Signs Blood pressure 99/61, pulse 91, temperature 97.9 F (36.6 C), temperature source Oral, resp. rate 16, height '5\' 2"'$  (1.575 m), weight 53.8 kg, SpO2 100 %. 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 Functional deficits which require 3+ hours per day of interdisciplinary therapy in a comprehensive inpatient rehab setting. Physiatrist is providing close team supervision and 24 hour management of active medical problems listed below. Physiatrist and rehab team continue to assess barriers to discharge/monitor patient progress toward functional and  medical goals  Care Tool:  Bathing    Body parts bathed by patient: Right arm, Left arm, Chest, Abdomen, Right upper leg, Left upper leg, Face   Body parts bathed by helper: Front perineal area, Buttocks, Right lower leg, Left lower leg     Bathing assist Assist Level: Maximal Assistance - Patient 24 - 49%     Upper Body Dressing/Undressing Upper body dressing   What is the patient wearing?: Pull over shirt    Upper body assist Assist Level: Supervision/Verbal cueing    Lower Body Dressing/Undressing Lower body dressing      What is the patient wearing?: Incontinence brief, Pants     Lower body assist Assist for lower body dressing: Total Assistance - Patient < 25%     Toileting Toileting    Toileting assist Assist for toileting: Total Assistance - Patient < 25%     Transfers Chair/bed transfer  Transfers assist     Chair/bed transfer assist level: Moderate Assistance - Patient 50 - 74%     Locomotion Ambulation   Ambulation assist   Ambulation activity did not occur: Safety/medical concerns (fatigue, poor adherance to TDWB precautions)          Walk 10 feet activity   Assist  Walk 10 feet activity did not occur: Safety/medical concerns (fatigue, poor adherance to TDWB precautions)        Walk 50 feet activity   Assist Walk 50 feet with 2 turns activity did not occur: Safety/medical concerns (fatigue, poor adherance to TDWB precautions)         Walk 150 feet activity   Assist Walk 150 feet activity did not occur: Safety/medical concerns (fatigue, poor adherance to TDWB precautions)         Walk 10 feet on uneven surface  activity   Assist Walk 10 feet on uneven surfaces activity did not occur: Safety/medical concerns (fatigue, poor adherance to TDWB precautions)         Wheelchair     Assist Is the patient using a wheelchair?: Yes Type of Wheelchair: Manual    Wheelchair assist level: Dependent - Patient 0% Max  wheelchair distance: >141f    Wheelchair 50 feet with 2 turns activity    Assist        Assist Level: Dependent - Patient 0%   Wheelchair 150 feet activity     Assist      Assist Level: Dependent - Patient 0%   Blood pressure 99/61, pulse 91, temperature 97.9 F (36.6 C), temperature source Oral, resp. rate 16, height '5\' 2"'$  (1.575 m), weight 53.8 kg, SpO2 100 %.  ?<??cal Problem List and Plan: 1. Functional deficits secondary to Left femoral neck fracture s/p percutaneous fixation on 03/29/22             -patient may shower, please cover incision             -ELOS/Goals: 12-14 days , PT/OT supervision to mod I             -Initial CIR evals today 2.  Antithrombotics: -DVT/anticoagulation:  Pharmaceutical: Lovenox             -antiplatelet therapy: ASA '81mg'$  3. Pain Management: Hydrocodone prn severe pain.  4. Mood/Behavior/Sleep: LCSW to follow for evaluation and support.              -antipsychotic agents: N/A 5. Neuropsych/cognition: This patient is capable of making decisions on her own behalf. 6. Skin/Wound Care: Routine pressure relief measures.  7. Fluids/Electrolytes/Nutrition: Monitor I/O. Check CMET in am.  8. Left subcapital femur Fx s/p fixation: TDWB per Dr. HEarnestine Leys9.T1DM with prior episodes of hypoglycemia: Continue omnipod and dexcom --will consult diabetic coordinator to assist with management.             --Followed by Dr TMee Hives 10. Lightheadedness/weakness: Question due to orthostatic v/s neurogenic due to DM.  --Also reports of diplopia per opthal notes.  - Continue Midodrine for BP support.  11. CAD/Blood pressure: Denies chest pain 12. Depression/anxiety?: Continue Zoloft and Bupropion.  13. Acute blood loss anemia: Stable around 9.0. Recheck CBC in am.              -Continue iron supplementation 14. Hypokalemia: Was supplemented X 1 today. K+ 3.4 12/28. Recheck in am.  15. Constipation: changed senna to HS 16. Nausea:  scheduled compazine prior to breakfast 17. Suboptimal magnesium: supplemented 2grams IV on 12/28.   LOS: 1 days A FACE TO FACE EVALUATION WAS PERFORMED  KMartha ClanP Ezrael Sam 04/03/2022, 3:12 PM

## 2022-04-04 DIAGNOSIS — S72002D Fracture of unspecified part of neck of left femur, subsequent encounter for closed fracture with routine healing: Secondary | ICD-10-CM | POA: Diagnosis not present

## 2022-04-04 LAB — GLUCOSE, CAPILLARY
Glucose-Capillary: 369 mg/dL — ABNORMAL HIGH (ref 70–99)
Glucose-Capillary: 192 mg/dL — ABNORMAL HIGH (ref 70–99)
Glucose-Capillary: 199 mg/dL — ABNORMAL HIGH (ref 70–99)
Glucose-Capillary: 430 mg/dL — ABNORMAL HIGH (ref 70–99)
Glucose-Capillary: 313 mg/dL — ABNORMAL HIGH (ref 70–99)

## 2022-04-04 MED ORDER — HYDROCODONE-ACETAMINOPHEN 7.5-325 MG PO TABS
1.0000 | ORAL_TABLET | ORAL | Status: DC | PRN
  Administered 2022-04-04 – 2022-04-05 (×3): 2 via ORAL
  Administered 2022-04-05: 1 via ORAL
  Administered 2022-04-06 – 2022-04-07 (×2): 2 via ORAL
  Filled 2022-04-04: qty 1
  Filled 2022-04-04 (×5): qty 2

## 2022-04-04 NOTE — Progress Notes (Signed)
Occupational Therapy Session Note  Patient Details  Name: Diana Frost MRN: [id number] Date of Birth: 1945/04/09  Today's Date: 04/04/2022 OT Individual Time: 1045-1200  , 2:20-300 OT Individual Time Calculation (min): 75 min , 45 mins   Short Term Goals: Week 1:  OT Short Term Goal 1 (Week 1): Pt will complete 1/3 toileting steps with CGA for standing balance using RW OT Short Term Goal 2 (Week 1): Pt will complete toilet transfer with CGA using RW OT Short Term Goal 3 (Week 1): Pt will use AE PRN during LB clothing with min cues needed for technique  Skilled Therapeutic Interventions/Progress Updates:    1st OT Session:  The pt was in bed upon arrival and indicated that she was in agreement with working on OT to improve her functional outcome. The pt was able to come from supine to EOB with MinA, she was able to come from EOB to w/c LOF using the RW for additional balance with MinA and additional  time. The pt was able to doff an overhead shirt with SBA and she was able to doff her LB garments with ModA for a brief and pants.  The pt was then transported to the shower requiring MinA and  initial cues while incorporating the arm rest of the chair and the grab bars in the shower stall for additional balance.  The pt was provided cues in relation to   precautions for the LLE. The pt was able to bathe her UB with s/u assist and MinA for LB using the grab bar for coming from sit to stand to wash her perineal area and bottom.  The pt was MaxA for task performance with  BLE.   The pt was able to transfer  from the shower to the w/c with MinA using the grab bar and armrest of the w/c.  The pt went on to donn a bra and over head top with s/u assist and ModA for her brief and pants. The pt was MaxA for her ted hose and grippy socks.  The pt was able to transfer back to bed LOF with MinA using the RW and the bed rails for  balance.  At the end of treatment, the  call light and bedside table were within  reach and all additional needs were addressed. The pt had no c/o pain at the time of treatment.  2nd OT Session:  The pt was able to transfer from bed LOF to the w/c using the bed rail and the arm of the w/c to transfer with vc/s with attention to safety associated with the LLE. The pt was transported to the gym and worked on the [employer]  for [employer]  her LLE supported on the footrest.  The pt was able to work on the machine for a duration of 12 minutes with 2 rest breaks . The pt went on to complete shld flexion, horizontal abduction, shld rotation , and large circles using a 1lb dowel , 1 set of 15 with rest breaks as needed, the pt required 2 rest breask. The pt was transported back to her room and transferred to bed with CGA and additional time using the bed rails for additional balance. The call light and bedside table were within reach and the bed alarm was activated.  Therapy Documentation Precautions:  Precautions Precautions: Fall Restrictions Weight Bearing Restrictions: Yes LLE Weight Bearing: Weight bearing as tolerated  Therapy/Group: Individual Therapy  Yvonne Kendall 04/04/2022, 12:47 PM

## 2022-04-04 NOTE — Progress Notes (Signed)
PROGRESS NOTE   Subjective/Complaints:  Pt reports has cough/tickle in her throat this AM.  L leg is more sore after therapy- more than has been having- wants to have Norco that was stopped due to lack of use.  LBM 2 days ago- is normal for her.    ROS:  Pt denies SOB, abd pain, CP, N/V/C/D, and vision changes  Objective:   No results found. Recent Labs    04/02/22 0525 04/03/22 0549  WBC 4.5 3.8*  HGB 9.0* 8.7*  HCT 27.6* 26.0*  PLT 199 199   Recent Labs    04/02/22 0525 04/03/22 0549  NA 141 139  K 3.4* 3.5  CL 105 101  CO2 27 29  GLUCOSE 154* 175*  BUN 19 14  CREATININE 0.71 0.76  CALCIUM 8.7* 8.5*    Intake/Output Summary (Last 24 hours) at 04/04/2022 1310 Last data filed at 04/04/2022 0700 Gross per 24 hour  Intake 438 ml  Output --  Net 438 ml        Physical Exam: Vital Signs Blood pressure 134/72, pulse 99, temperature 98.8 F (37.1 C), temperature source Oral, resp. rate 17, height '5\' 2"'$  (1.575 m), weight 53.8 kg, SpO2 96 %.   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 Functional deficits which require  3+ hours per day of interdisciplinary therapy in a comprehensive inpatient rehab setting. Physiatrist is providing close team supervision and 24 hour management of active medical problems listed below. Physiatrist and rehab team continue to assess barriers to discharge/monitor patient progress toward functional and medical goals  Care Tool:  Bathing    Body parts bathed by patient: Right arm, Left arm, Chest, Abdomen, Right upper leg, Left upper leg, Face   Body parts bathed by helper: Front perineal area, Buttocks, Right lower leg, Left lower leg     Bathing assist Assist Level: Maximal Assistance - Patient 24 - 49%     Upper Body Dressing/Undressing Upper body dressing   What is the patient wearing?: Pull over shirt    Upper body assist Assist Level: Supervision/Verbal cueing    Lower Body Dressing/Undressing Lower body dressing      What is the patient wearing?: Incontinence brief, Pants     Lower body assist Assist for lower body dressing: Total Assistance - Patient < 25%     Toileting Toileting    Toileting assist Assist for toileting: Total Assistance -  Patient < 25%     Transfers Chair/bed transfer  Transfers assist     Chair/bed transfer assist level: Moderate Assistance - Patient 50 - 74%     Locomotion Ambulation   Ambulation assist   Ambulation activity did not occur: Safety/medical concerns (fatigue, poor adherance to TDWB precautions)          Walk 10 feet activity   Assist  Walk 10 feet activity did not occur: Safety/medical concerns (fatigue, poor adherance to TDWB precautions)        Walk 50 feet activity   Assist Walk 50 feet with 2 turns activity did not occur: Safety/medical concerns (fatigue, poor adherance to TDWB precautions)         Walk 150 feet activity   Assist Walk 150 feet activity did not occur: Safety/medical concerns (fatigue, poor adherance to TDWB precautions)         Walk 10 feet on uneven surface   activity   Assist Walk 10 feet on uneven surfaces activity did not occur: Safety/medical concerns (fatigue, poor adherance to TDWB precautions)         Wheelchair     Assist Is the patient using a wheelchair?: Yes Type of Wheelchair: Manual    Wheelchair assist level: Dependent - Patient 0% Max wheelchair distance: >150f    Wheelchair 50 feet with 2 turns activity    Assist        Assist Level: Dependent - Patient 0%   Wheelchair 150 feet activity     Assist      Assist Level: Dependent - Patient 0%   Blood pressure 134/72, pulse 99, temperature 98.8 F (37.1 C), temperature source Oral, resp. rate 17, height '5\' 2"'$  (1.575 m), weight 53.8 kg, SpO2 96 %.  ?<??cal Problem List and Plan: 1. Functional deficits secondary to Left femoral neck fracture s/p percutaneous fixation on 03/29/22             -patient may shower, please cover incision             -ELOS/Goals: 12-14 days , PT/OT supervision to mod I             Con't CIR- PT and OT-  2.  Antithrombotics: -DVT/anticoagulation:  Pharmaceutical: Lovenox             -antiplatelet therapy: ASA '81mg'$  3. Pain Management: Hydrocodone prn severe pain.   12/30- Norco was d/c;d yesterday due to "lack of use"- will restart since hurting more with therapy.  4. Mood/Behavior/Sleep: LCSW to follow for evaluation and support.              -antipsychotic agents: N/A 5. Neuropsych/cognition: This patient is capable of making decisions on her own behalf. 6. Skin/Wound Care: Routine pressure relief measures.  7. Fluids/Electrolytes/Nutrition: Monitor I/O. Check CMET in am.  8. Left subcapital femur Fx s/p fixation: TDWB per Dr. HEarnestine Leys9.T1DM with prior episodes of hypoglycemia: Continue omnipod and dexcom --will consult diabetic coordinator to assist with management.             --Followed by Dr TMee Hives  12/30- CBG's 100-369- have consulted DM coordinator- might need to call outpt physician? 10.  Lightheadedness/weakness: Question due to orthostatic v/s neurogenic due to DM.  --Also reports of diplopia per optho notes.  - Continue Midodrine for BP support. 12/30- BP controlled- con't regimen  11. CAD/Blood pressure: Denies chest pain 12. Depression/anxiety?: Continue Zoloft and Bupropion.  13. Acute blood loss anemia: Stable around 9.0. Recheck  CBC in am.              -Continue iron supplementation 14. Hypokalemia: Was supplemented X 1 today. K+ 3.4 12/28. Recheck in am.   12/30- K+ 3.5- will recheck Monday 15. Constipation: changed senna to HS  12/30- LBM 2 days ago- is normal for her to go q2-3 days.  16. Nausea: scheduled compazine prior to breakfast 17. Suboptimal magnesium: supplemented 2grams IV on 12/28.  12/30- last Mg 1.7-   I spent a total of 35   minutes on total care today- >50% coordination of care- due to d/w nursing about Norco that was d/c'd- will restart for pain due to therapies.  Will consult DM coordinators again- might not work on weekends- might need to call Dr Ladell Pier 1/2   LOS: 2 days A FACE TO FACE EVALUATION WAS PERFORMED  Diana Frost 04/04/2022, 1:10 PM

## 2022-04-04 NOTE — Progress Notes (Signed)
Physical Therapy Session Note  Patient Details  Name: Diana Frost MRN: [id number] Date of Birth: [birth year]  Today's Date: 04/04/2022 PT Individual Time: 0800-0915 PT Individual Time Calculation (min): 75 min   Short Term Goals: Week 1:  PT Short Term Goal 1 (Week 1): pt will perform bed mobility with CGA overall PT Short Term Goal 2 (Week 1): pt will transfer sit<>stand with LRAD and CGA PT Short Term Goal 3 (Week 1): pt will initate gait training with RW  Skilled Therapeutic Interventions/Progress Updates:     Patient in bed with MD rounding upon PT arrival. Patient alert and agreeable to PT session. Patient reported 8/10 L hip/thigh pain and 4/10 R shoulder pain during session, RN made aware patient reports she cannot take tylenol, no other pain medication ordered, RN to follow-up with MD. PT provided repositioning, rest breaks, and distraction as pain interventions throughout session. Patient reports R shoulder pain/soreness since this most recent fall.  Patient reports significant hx of falls due to "tripping over her feet," or posterior LOB. X2 falls with injury per patient report. Educated patient on fall risk/prevention, home modifications to prevent falls, and activation of emergency services in the event of a fall during session. 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   Therapeutic Activity: Bed Mobility: Patient performed supine to/from sit x2 with min A for L lower extremity management with use of bed rails and HOB slightly elevated. Provided verbal cues for moving her feet off the bed then sitting up. Transfers: Patient performed stand pivot bed<>BSC with min-mod A using RW. Provided verbal cues for maintaining TDWB on L lower extremity and pivot technique on R foot. Provided manual assistance for AD management.   Therapeutic Exercise: Patient performed the  following exercises with verbal and tactile cues for proper technique. -supine glute stets 2x10 with 5 sechold -supine quad sets 2x10 with 5 sec hold -supine ankle pumps 2x10 -supine L heel slides for knee/hip flexion with minimal ROM x5 limited by increased L hip pain  Manual Therapy: Provided soft tissue mobilization to R paraspinals, upper trap, rhomboids, and levator scapulea muscle for reduced R shoulder pain, 1-2/10 after. No pain to palpation and ROM WFL for patient's age without pain through ROM.   Patient required increased time for initiation, cuing, rest breaks, and for completion of tasks throughout session due to elevated pain levels. Utilized therapeutic use of self throughout to promote efficiency.   Patient in bed at end of session with breaks locked, bed alarm set, and all needs within reach.   Therapy Documentation Precautions:  Precautions Precautions: Fall Restrictions Weight Bearing Restrictions: Yes LLE Weight Bearing: Weight bearing as tolerated   Therapy/Group: Individual Therapy  Yurianna Tusing L Carnisha Feltz PT, DPT, NCS, CBIS  04/04/2022, 12:26 PM

## 2022-04-04 NOTE — Progress Notes (Signed)
Patient had a blood sugar of 199 before lunch, patient gave self .4 units of insulin.

## 2022-04-04 NOTE — Progress Notes (Signed)
Patient had a blood sugar of 398 before breakfast, patient gave herself 3.45 units of insulin.

## 2022-04-05 LAB — GLUCOSE, CAPILLARY
Glucose-Capillary: 307 mg/dL — ABNORMAL HIGH (ref 70–99)
Glucose-Capillary: 171 mg/dL — ABNORMAL HIGH (ref 70–99)
Glucose-Capillary: 219 mg/dL — ABNORMAL HIGH (ref 70–99)
Glucose-Capillary: 182 mg/dL — ABNORMAL HIGH (ref 70–99)
Glucose-Capillary: 149 mg/dL — ABNORMAL HIGH (ref 70–99)

## 2022-04-05 NOTE — IPOC Note (Signed)
Overall Plan of Care [facility]) Patient Details Name: Diana Frost MRN: [id number] DOB: [birth year]  Admitting Diagnosis: Femur fracture, left [facility])  Hospital Problems: Principal Problem:   Femur fracture, left ([place])     Functional Problem List: Nursing Bowel, Pain, Safety, Endurance, Medication Management, Skin Integrity  PT Balance, Edema, Endurance, Motor, Pain, Skin Integrity  OT Balance, Cognition, Edema, Endurance, Pain, Motor, Nutrition  SLP    TR         Basic ADL's: OT Bathing, Dressing, Toileting     Advanced  ADL's: OT Simple Meal Preparation     Transfers: PT Bed Mobility, Bed to Chair, Car, Manufacturing systems engineer, Metallurgist: PT Ambulation, Emergency planning/management officer, Stairs     Additional Impairments: OT None  SLP        TR      Anticipated Outcomes Item Anticipated Outcome  Self Feeding Mod I  Swallowing      Basic self-care  Supervision to mod I  Toileting  Mod I   Bathroom Transfers Mod I  Bowel/Bladder  manage bowel w mod I assist  Transfers  Mod I with LRAD  Locomotion  supervision with LRAD  Communication     Cognition     Pain  < 4 with prns  Safety/Judgment  manage w cues   Therapy Plan: PT Intensity: Minimum of 1-2 x/day ,45 to 90 minutes PT Frequency: 5 out of 7 days PT Duration Estimated Length of Stay: 2 weeks OT Intensity: Minimum of 1-2 x/day, 45 to 90 minutes OT Frequency: 5 out of 7 days OT Duration/Estimated Length of Stay: 2 weeks     Team Interventions: Nursing Interventions Bowel Management, Medication Management, Discharge Planning, Patient/Family Education, Pain Management, Disease Management/Prevention, Skin Care/Wound Management  PT interventions Ambulation/gait training, Community reintegration, DME/adaptive equipment instruction, Neuromuscular re-education, Psychosocial support, Stair training, UE/LE Strength taining/ROM, Wheelchair propulsion/positioning, Training and development officer, Discharge  planning, Pain management, Skin care/wound management, Therapeutic Activities, UE/LE Coordination activities, Cognitive remediation/compensation, Disease management/prevention, Functional mobility training, Patient/family education, Therapeutic Exercise, Visual/perceptual remediation/compensation  OT Interventions Balance/vestibular training, Discharge planning, Self Care/advanced ADL retraining, Pain management, Therapeutic Activities, UE/LE Coordination activities, Cognitive remediation/compensation, Disease mangement/prevention, Functional mobility training, Patient/family education, Skin care/wound managment, Therapeutic Exercise, DME/adaptive equipment instruction, UE/LE Strength taining/ROM, Wheelchair propulsion/positioning, Psychosocial support  SLP Interventions    TR Interventions    SW/CM Interventions Discharge Planning, Psychosocial Support, Patient/Family Education, Disease Management/Prevention   Barriers to Discharge MD  Medical stability  Nursing Decreased caregiver support, Weight bearing restrictions 2 level ramped entry; main bedroom w bedridden spouse, 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, dtr assists prn  PT Decreased caregiver support, Wound Care, Incontinence, Weight bearing restrictions pain, LLE TDWB precautions, primary caregiver for husband who is bed bound  OT Incontinence, Weight bearing restrictions    SLP      SW Lack of/limited family support, Decreased caregiver support, Home environment access/layout     Team Discharge Planning: Destination: PT-Home ,OT- Home , SLP-  Projected Follow-up: PT-Home health PT, OT-  Home health OT, SLP-  Projected Equipment Needs: PT-To be determined, OT- To be determined, SLP-  Equipment Details: PT-has RW and rollator, OT-  Patient/family involved in discharge planning: PT- Patient, Family member/caregiver,  OT-Patient, Family member/caregiver, SLP-   MD ELOS: 12-14 days Medical Rehab Prognosis:  Excellent Assessment:  The patient has been admitted for CIR therapies with the diagnosis of left femoral neck fracture. The team will be addressing functional mobility, strength, stamina, balance,  safety, adaptive techniques and equipment, self-care, bowel and bladder mgt, patient and caregiver education. Goals have been set at supervision.  Anticipated discharge destination is home.         See Team Conference Notes for weekly updates to the plan of care

## 2022-04-06 DIAGNOSIS — I959 Hypotension, unspecified: Secondary | ICD-10-CM | POA: Diagnosis not present

## 2022-04-06 LAB — GLUCOSE, CAPILLARY
Glucose-Capillary: 134 mg/dL — ABNORMAL HIGH (ref 70–99)
Glucose-Capillary: 165 mg/dL — ABNORMAL HIGH (ref 70–99)
Glucose-Capillary: 85 mg/dL (ref 70–99)
Glucose-Capillary: 144 mg/dL — ABNORMAL HIGH (ref 70–99)
Glucose-Capillary: 156 mg/dL — ABNORMAL HIGH (ref 70–99)

## 2022-04-06 NOTE — Progress Notes (Signed)
Occupational Therapy Session Note  Patient Details  Name: Diana Frost MRN: [id number] Date of Birth: [birth year]  Today's Date: 04/06/2022 OT Individual Time: 0920-1000 & 1300-1412 OT Individual Time Calculation (min): 40 min & 72 min   Short Term Goals: Week 1:  OT Short Term Goal 1 (Week 1): Pt will complete 1/3 toileting steps with CGA for standing balance using RW OT Short Term Goal 2 (Week 1): Pt will complete toilet transfer with CGA using RW OT Short Term Goal 3 (Week 1): Pt will use AE PRN during LB clothing with min cues needed for technique  Skilled Therapeutic Interventions/Progress Updates:  Session 1 Skilled OT intervention completed with focus on self-care needs, functional transfers and pain management. Pt received seated in w/c initially requesting assist to return to bed, however agreeable with light encouragement to participate in therapy. Un-rated pain reported in L hip and shoulder. Nurse in room to administer pain meds at start of session. Therapist offered repositioning and rest breaks for pain reduction throughout.  Pt able to donn hearing aids with set up A of charger box. Shortly after consuming meds, pt had episode of vomiting; nurse made aware of status as pt did not keep down any of her meds. Therapist retrieved emesis bag, wash cloth for cleaning up and diet drink per pt request.   Per nursing, IV nurse planning to place IV for meds to assist with N/V prior to administering meds therefore deferred rest of session to transferring back to bed as well as 2/2 fatigue/nausea.  Able to stand with light min A using RW, with pt elevating/hovering LLE from ground vs placing on ground with noted improvement of at least TDWB adherence this session. Min A stand pivot with cues needed for shifting R foot in direction of EOB and RW positioning with min A needed for RW position as pt moved RW ahead of body requiring min A for balance to not lose posterior or R LOB.  Able to  transition into bed with min A for LLE pain management however less assist overall than previous session at max A. Pt attempted to bridge with BLE to boost self up in bed however therapist educated on WB precautions that also apply to when lying down in bed. Able to reposition with RLE bridge and overall min A towards HOB. Therapist placed fresh ice packs to L hip and L knee, and hot packs to base of neck for pain relief while IV nurse placed IV on pt. Pt remained semi-supine in bed, with bed alarm on, all needs in reach.  Session 2 Skilled OT intervention completed with focus on toileting needs, BP management. Pt received upright in bed, agreeable to session. No pain reported during session.  Pt requested to finish some of her lunch, with therapist discussing/educating on toileting during rest of lunch consumption. Pt expressed concerns regarding her incontinence and urinary challenges with therapist educating on bladder healing as well as aging in place recommendations and tangible way that she can manage incontinence such as timed toileting to eliminate urgency etc. Therapist also switched out current [facility] for new one that had adjustable legs to lower the seat for sitting comfort to decrease pressure on L femur, as well as retrieved 16x16 [place] with L elevating leg rest for proper fit vs 18x18 in room currently.  Pt requested to try using bathroom. Able to transition with supervision with [facility] elevated about 75 degrees to EOB with time needed for LLE management. Upon sitting up, pt  reported feeling "dizzy."   Vitals assessed as follows: BP 86/59, HR 104 bpm; sitting EOB- symptomatic BP 104/56, HR 94 bpm; sitting EOB- asymptomatic BP 85/53, HR 103 bpm; sitting EOB- asymptomatic; knee high TEDs on BP 91/70, HR 118 bpm; sitting on commode after stand pivot transfer BP 87/57, HR 115 bpm; EOB after stand pivot back to bed  Pt was able to stand with min A using RW, then stand pivot with min A, however poor  TDWB adherence as pt did not have R shoe donned vs grip socks during transfer. Anticipate pt will need shoe on R foot for transfer to provide consistent WB adherence. Continent of BM only, with overall max A needed for toileting needs including ensuring cleanliness with posterior peri-care, and donning pants over hips in stance. Mod A stand pivot back to EOB per pt request, then CGA transition into bed. Reminders needed again to avoid WB through LLE when bridging, then able to boost towards [facility] with mod A using R bridge technique only.  Pt remained upright in bed, with bed alarm on/activated, nurse made aware of BP fluctuations. All needs in reach at end of session.   Therapy Documentation Precautions:  Precautions Precautions: Fall Restrictions Weight Bearing Restrictions: Yes LLE Weight Bearing: Touchdown weight bearing    Therapy/Group: Individual Therapy  Blase Mess, MS, OTR/L  04/06/2022, 3:43 PM

## 2022-04-06 NOTE — Progress Notes (Signed)
PROGRESS NOTE   Subjective/Complaints: Feeling fatigued this AM, labs ordered for tomorrow and will add a Vitamin D and B12 level. She would like to get back in bed, requested nursing to assist   ROS:  Pt denies SOB, abd pain, CP, N/V/C/D, and vision changes, +fatigue  Objective:   No results found. No results for input(s): "WBC", "HGB", "HCT", "PLT" in the last 72 hours.  No results for input(s): "NA", "K", "CL", "CO2", "GLUCOSE", "BUN", "CREATININE", "CALCIUM" in the last 72 hours.   Intake/Output Summary (Last 24 hours) at 04/06/2022 1200 Last data filed at 04/06/2022 1027 Gross per 24 hour  Intake 460 ml  Output 2 ml  Net 458 ml        Physical Exam: Vital Signs Blood pressure 118/71, pulse 93, temperature 98.4 F (36.9 C), temperature source Oral, resp. rate 20, height '5\' 2"'$  (1.575 m), weight 53.8 kg, SpO2 94 %.   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 Functional deficits which require 3+ hours per day of interdisciplinary therapy in a comprehensive inpatient rehab setting. Physiatrist is providing close team supervision  and 24 hour management of active medical problems listed below. Physiatrist and rehab team continue to assess barriers to discharge/monitor patient progress toward functional and medical goals  Care Tool:  Bathing    Body parts bathed by patient: Right arm, Left arm, Chest, Abdomen, Right upper leg, Left upper leg, Face   Body parts bathed by helper: Front perineal area, Buttocks, Right lower leg, Left lower leg     Bathing assist Assist Level: Maximal Assistance - Patient 24 - 49%     Upper Body Dressing/Undressing Upper body dressing   What is the patient wearing?: Pull over shirt    Upper body assist Assist Level: Supervision/Verbal cueing    Lower Body Dressing/Undressing Lower body dressing      What is the patient wearing?: Incontinence brief, Pants     Lower body assist Assist for lower body dressing: Total Assistance - Patient < 25%     Toileting Toileting    Toileting assist Assist for toileting: Total Assistance - Patient < 25%     Transfers Chair/bed transfer  Transfers assist     Chair/bed transfer assist level: Minimal Assistance - Patient > 75%     Locomotion Ambulation   Ambulation assist   Ambulation activity did not occur: Safety/medical concerns (fatigue, poor adherance to TDWB precautions)  Walk 10 feet activity   Assist  Walk 10 feet activity did not occur: Safety/medical concerns (fatigue, poor adherance to TDWB precautions)        Walk 50 feet activity   Assist Walk 50 feet with 2 turns activity did not occur: Safety/medical concerns (fatigue, poor adherance to TDWB precautions)         Walk 150 feet activity   Assist Walk 150 feet activity did not occur: Safety/medical concerns (fatigue, poor adherance to TDWB precautions)         Walk 10 feet on uneven surface  activity   Assist Walk 10 feet on uneven surfaces activity did not occur: Safety/medical concerns (fatigue, poor adherance to TDWB  precautions)         Wheelchair     Assist Is the patient using a wheelchair?: Yes Type of Wheelchair: Manual    Wheelchair assist level: Supervision/Verbal cueing Max wheelchair distance: 168f    Wheelchair 50 feet with 2 turns activity    Assist        Assist Level: Supervision/Verbal cueing   Wheelchair 150 feet activity     Assist      Assist Level: Supervision/Verbal cueing   Blood pressure 118/71, pulse 93, temperature 98.4 F (36.9 C), temperature source Oral, resp. rate 20, height '5\' 2"'$  (1.575 m), weight 53.8 kg, SpO2 94 %.  Medical Problem List and Plan: 1. Functional deficits secondary to Left femoral neck fracture s/p percutaneous fixation on 03/29/22             -patient may shower, please cover incision             -ELOS/Goals: 12-14 days , PT/OT supervision to mod I             Con't CIR- PT and OT-   Check Vitamin D level tomorrow 2.  Antithrombotics: -DVT/anticoagulation:  Pharmaceutical: Lovenox             -antiplatelet therapy: ASA '81mg'$  3. Pain Management: Hydrocodone prn severe pain.   12/30- Norco was d/c;d yesterday due to "lack of use"- will restart since hurting more with therapy.  4. Mood/Behavior/Sleep: LCSW to follow for evaluation and support.              -antipsychotic agents: N/A 5. Neuropsych/cognition: This patient is capable of making decisions on her own behalf. 6. Skin/Wound Care: Routine pressure relief measures.  7. Fluids/Electrolytes/Nutrition: Monitor I/O. Check CMET in am.  8. Left subcapital femur Fx s/p fixation: TDWB per Dr. HEarnestine Leys9.T1DM with prior episodes of hypoglycemia: Continue omnipod and dexcom --will consult diabetic coordinator to assist with management.             --Followed by Dr TMee Hives  12/30- CBG's 100-369- have consulted DM coordinator- might need to call outpt physician? 10. Lightheadedness/weakness: Question due to orthostatic v/s neurogenic due to DM.  --Also reports of  diplopia per optho notes.  - Continue Midodrine for BP support. 12/30- BP controlled- con't regimen  11. CAD/Blood pressure: Denies chest pain 12. Depression/anxiety?: Continue Zoloft and Bupropion.  13. Acute blood loss anemia: Stable around 9.0. Recheck CBC in am.              -Continue iron supplementation 14. Hypokalemia: Was supplemented X 1 today. K+ 3.4 12/28. Recheck in am.   12/30- K+ 3.5- will recheck Monday 15. Constipation: changed senna to HS  12/30- LBM 2 days ago- is normal for her to go q2-3 days.  16.  Fatigue: check B12 level tomorrow.  17. Suboptimal magnesium: supplemented 2grams IV on 12/28, repeat tomorrow 18. Radiation induced nausea: discussed hyperbaric oxygen therapy outpatient. Scheduled compazine prior to breakfast.    LOS: 4 days A FACE TO FACE EVALUATION WAS PERFORMED  Kaeya Schiffer P Pamlea Finder 04/06/2022, 12:00 PM

## 2022-04-06 NOTE — Progress Notes (Signed)
Physical Therapy Session Note  Patient Details  Name: Diana Frost MRN: [id number] Date of Birth: [birth year]  Today's Date: 04/06/2022 PT Individual Time: 0730-0842 PT Individual Time Calculation (min): 72 min   Short Term Goals: Week 1:  PT Short Term Goal 1 (Week 1): pt will perform bed mobility with CGA overall PT Short Term Goal 2 (Week 1): pt will transfer sit<>stand with LRAD and CGA PT Short Term Goal 3 (Week 1): pt will initate gait training with RW  Skilled Therapeutic Interventions/Progress Updates:   Received pt semi-reclined in bed finishing breakfast. Pt agreeable to PT treatment and reported feeling sleepy but denied any pain in LLE as rest, just reporting generalized "soreness". However, LLE pain increased with mobility (premedicated). Pt gave herself 5.2 units of insulin and blood glucose 258 - RN made aware.   Session with emphasis on functional mobility/transfers, dressing, toileting, dynamic standing balance/coordination, and WC mobility. Pt transferred semi-reclined<>sitting EOB with HOB elevated and use of bedrails with min A for LLE - paused halfway through due to feeling dizzy. Pt required increased time to scoot to EOB, then reported brief was wet stating "it's easier to pee in brief rather than getting up at night". Discouraged pt from continuing to soil brief, and encouraged mobility to improve strength, regain independence, and maintain skin integrity.  Pt stood x 3 trials with RW and min A from EOB. Doffed dirty brief in standing and performed anterior peri-care with min A for standing balance. In sitting, donned pants and R shoe only (to assist with adherence to LLE TDWB precautions) and pt required total A to pull pants over hips. Pt transferred bed<>WC stand<>pivot with RW and min A - pt demonstrating much better adherence to LLE TDWB precautions with R shoe on and L shoe off. Upon reaching WC pt reported increased neck and back pain - rated 6/10. Requested k-pad  order from MD while pt sat in [facility] and brushed teeth/washed face at sink.   Pt then reported urge to have BM and transferred to/from toilet with bedside commode over top with min A. Pt required total A for clothing management and continent of bowel/bladder. Pt required min A to stand from commode and performed peri-care with CGA. Sat in WC at sink to perform hand hygiene. Pt reported feeling "pooped" after toileting and required increased time and multiple rest breaks throughout session due to fatigue. Worked on WC mobility 182f using BUE and supervision with emphasis on cardiovascular endurance and UE strength. Pt required increased time and cues for hand placement to improve propulsion stride. [facility] Concluded session with pt sitting in WC, needs within reach, and seatbelt alarm on. Provided pt with heat packs for back and ice pack for L hip and L knee.   Of note, pt demonstrated improvements in LLE TDWB precautions with R tennis shoe on.  Therapy Documentation Precautions:  Precautions Precautions: Fall Restrictions Weight Bearing Restrictions: Yes LLE Weight Bearing: Weight bearing as tolerated  Therapy/Group: Individual Therapy AAlfonse AlpersPT, DPT  04/06/2022, 6:59 AM

## 2022-04-07 ENCOUNTER — Ambulatory Visit (HOSPITAL_COMMUNITY): Payer: Medicare Other | Attending: Physical Medicine and Rehabilitation

## 2022-04-07 DIAGNOSIS — M79662 Pain in left lower leg: Secondary | ICD-10-CM | POA: Diagnosis not present

## 2022-04-07 DIAGNOSIS — R52 Pain, unspecified: Secondary | ICD-10-CM | POA: Diagnosis not present

## 2022-04-07 DIAGNOSIS — M7989 Other specified soft tissue disorders: Secondary | ICD-10-CM | POA: Diagnosis not present

## 2022-04-07 DIAGNOSIS — I959 Hypotension, unspecified: Secondary | ICD-10-CM | POA: Diagnosis not present

## 2022-04-07 LAB — BASIC METABOLIC PANEL
Anion gap: 9 (ref 5–15)
BUN: 17 mg/dL (ref 8–23)
CO2: 28 mmol/L (ref 22–32)
Calcium: 8.7 mg/dL — ABNORMAL LOW (ref 8.9–10.3)
Chloride: 100 mmol/L (ref 98–111)
Creatinine, Ser: 0.77 mg/dL (ref 0.44–1.00)
GFR, Estimated: >60 mL/min (ref >60)
Glucose, Bld: 212 mg/dL — ABNORMAL HIGH (ref 70–99)
Potassium: 4.1 mmol/L (ref 3.5–5.1)
Sodium: 137 mmol/L (ref 135–145)

## 2022-04-07 LAB — MAGNESIUM: Magnesium: 1.9 mg/dL (ref 1.7–2.4)

## 2022-04-07 LAB — GLUCOSE, CAPILLARY
Glucose-Capillary: 183 mg/dL — ABNORMAL HIGH (ref 70–99)
Glucose-Capillary: 217 mg/dL — ABNORMAL HIGH (ref 70–99)
Glucose-Capillary: 218 mg/dL — ABNORMAL HIGH (ref 70–99)
Glucose-Capillary: 166 mg/dL — ABNORMAL HIGH (ref 70–99)
Glucose-Capillary: 182 mg/dL — ABNORMAL HIGH (ref 70–99)

## 2022-04-07 LAB — VITAMIN D 25 HYDROXY (VIT D DEFICIENCY, FRACTURES): Vit D, 25-Hydroxy: 35.4 ng/mL (ref 30–100)

## 2022-04-07 LAB — CBC
HCT: 29.7 % — ABNORMAL LOW (ref 36.0–46.0)
Hemoglobin: 9.6 g/dL — ABNORMAL LOW (ref 12.0–15.0)
MCH: 31.2 pg (ref 26.0–34.0)
MCHC: 32.3 g/dL (ref 30.0–36.0)
MCV: 96.4 fL (ref 80.0–100.0)
Platelets: 343 10*3/uL (ref 150–400)
RBC: 3.08 MIL/uL — ABNORMAL LOW (ref 3.87–5.11)
RDW: 14.5 % (ref 11.5–15.5)
WBC: 6.3 10*3/uL (ref 4.0–10.5)
nRBC: 0 % (ref 0.0–0.2)

## 2022-04-07 LAB — VITAMIN B12: Vitamin B-12: 667 pg/mL (ref 180–914)

## 2022-04-07 MED ORDER — HYDROCODONE-ACETAMINOPHEN 7.5-325 MG PO TABS: 1.0000 | ORAL_TABLET | ORAL | Status: DC | PRN

## 2022-04-07 MED ORDER — VITAMIN D (ERGOCALCIFEROL) 1.25 MG (50000 UNIT) PO CAPS
50000.0000 [IU] | ORAL_CAPSULE | ORAL | Status: DC
  Administered 2022-04-07 – 2022-04-21 (×3): 50000 [IU] via ORAL
  Filled 2022-04-07 (×3): qty 1

## 2022-04-07 NOTE — Progress Notes (Signed)
Occupational Therapy Session Note  Patient Details  Name: Diana Frost MRN: [id number] Date of Birth: Feb 14, 1946  Today's Date: 04/07/2022 OT Individual Time: 1335-1430 OT Individual Time Calculation (min): 55 min    Short Term Goals: Week 1:  OT Short Term Goal 1 (Week 1): Pt will complete 1/3 toileting steps with CGA for standing balance using RW OT Short Term Goal 2 (Week 1): Pt will complete toilet transfer with CGA using RW OT Short Term Goal 3 (Week 1): Pt will use AE PRN during LB clothing with min cues needed for technique  Skilled Therapeutic Interventions/Progress Updates:  Skilled OT intervention completed with focus on BP management and education, functional sit > stands and stand pivot transfers. Pt received seated in w/c, agreeable to session. Un-rated LLE pain reported, MD aware, pre-medicated with ultrasound scheduled for pt to assess cause of pain. Therapist offered elevation, rest breaks and repositioning throughout for pain reduction.  Per PT, pt was orthostatic today, with MD ordering abdominal binder for BP intervention therefore OT assessed BP and vitals for focus of session.  Vitals assessed as follows: -BP 101/54, HR 98 bpm; sitting in w/c- asymptomatic; knee high TEDs already donned -BP 71/53, HR 113 bpm; in stance- 7/10 dizziness reported  -BP 99/59, HR 101 bpm; sitting in w/c- asymptomatic; with abdominal binder applied -BP 112/56, HR 95 bpm; sitting in w/c after 5 mins; asymptomatic -BP 69/52, HR 110 bpm; in stance with abdominal binder on- asymptomatic -BP 115/53, HR 99 bpm; sitting in w/c after 3 mins- asymptomatic -BP 126/60, HR 94 bpm; supine in bed- asymptomatic; abdominal binder off  CGA sit > stand using RW however CGA to min A needed for standing balance during BP assessment. Min/mod A stand pivot to EOB with pt actually attempting to stand prior to RW being in front of her and therapist advising against. Doffed abdominal binder at EOB, then Mod A  needed for bed mobility into supine.  Cues needed to avoid LLE WB in bed when repositioning. Pt remained semi-supine in bed, with bed alarm on/activated, and with all needs in reach at end of session. MD/nurse made aware of pt status and orthostatics.   Therapy Documentation Precautions:  Precautions Precautions: Fall Restrictions Weight Bearing Restrictions: Yes LLE Weight Bearing: Touchdown weight bearing    Therapy/Group: Individual Therapy  Blase Mess, MS, OTR/L  04/07/2022, 3:30 PM

## 2022-04-07 NOTE — Progress Notes (Signed)
Orthopedic Tech Progress Note Patient Details:  Diana Frost 1945-09-03 [id number]  Ortho Devices Type of Ortho Device: Abdominal binder Ortho Device/Splint Location: STOMACH Ortho Device/Splint Interventions: Ordered   Post Interventions Patient Tolerated: Well Instructions Provided: [facility] [place] 04/07/2022, 12:31 PM

## 2022-04-07 NOTE — Evaluation (Signed)
Speech Language Pathology Assessment and Plan  Patient Details  Name: Diana Frost MRN: [id number] Date of Birth: 04/03/1946  SLP Diagnosis: Cognitive Impairments  Rehab Potential: Excellent ELOS: 10-12 days    Today's Date: 04/07/2022 SLP Individual Time: 8657-8469 SLP Individual Time Calculation (min): 55 min   Hospital Problem: Principal Problem:   Femur fracture, left ([place])  Past Medical History:  Past Medical History:  Diagnosis Date   Coronary artery disease    Diabetes mellitus without complication ([place])    insulin pump   Headache    since ear problem started   Hypertension    Myocardial infarction ([place]) 11/01/13   Past Surgical History:  Past Surgical History:  Procedure Laterality Date   ABDOMINAL HYSTERECTOMY     BACK SURGERY     tail bone removed after fall/fracture   CATARACT EXTRACTION BILATERAL W/ ANTERIOR VITRECTOMY     CORONARY ANGIOPLASTY WITH STENT PLACEMENT  11/01/2013   [facility], stent x2   EYE SURGERY     HIP PINNING,CANNULATED Right 01/07/2019   Procedure: CANNULATED HIP PINNING;  Surgeon: Corky Mull, MD;  Location: [facility] ORS;  Service: Orthopedics;  Laterality: Right;   HIP PINNING,CANNULATED Left 03/29/2022   Procedure: PERCUTANEOUS FIXATION OF FEMORAL NECK;  Surgeon: Earnestine Leys, MD;  Location: [facility] ORS;  Service: Orthopedics;  Laterality: Left;   LEFT HEART CATH AND CORONARY ANGIOGRAPHY N/A 08/14/2016   Procedure: Left Heart Cath and Coronary Angiography;  Surgeon: Minna Merritts, MD;  Location: [place];  Service: Cardiovascular;  Laterality: N/A;   MELANOMA EXCISION Right    arm with skin graft   MYRINGOTOMY WITH TUBE PLACEMENT Left 08/21/2015   Procedure: MYRINGOTOMY WITH TUBE PLACEMENT;  Surgeon: Carloyn Manner, MD;  Location: [facility];  Service: ENT;  Laterality: Left;   NASOPHARYNGEAL BIOPSY N/A 11/07/2015   Procedure: NASOPHARYNGEAL BIOPSY;  Surgeon: Carloyn Manner, MD;  Location: [facility] ORS;   Service: ENT;  Laterality: N/A;   TONSILLECTOMY      Assessment / Plan / Recommendation Clinical Impression Patient  is a 77 year old female with history of T1DM on insulin pump, CAD, HTN, multiple falls w/gait disorder;  who was admitted to [facility] on 03/28/22 after a mechanical fall with onset of left hip pain and inability to walk. She was found to have left non-displaced subcapital hip fracture and underwent percutaneous fixation of femoral neck by Dr. Earnestine Leys on 03/29/22. Post op to be TTWB and on Lovenox for DVT prophylaxis. Post op with pain, reports of confusion, difficulty with WB restrictions as well as weakness and fatigue. CIR recommended due to functional decline.  Patient admitted 04/02/22. OT/PT with reports of cognitive deficits, therefore, SLP consulted to administer a cognitive-linguistic evaluation.  Upon arrival, patient was asleep in bed but easily awakened. Patient requested to use the bathroom and reported she has been transferring to the [facility] with a stand pivot and use of a RW. NT was also present to assist. Patient transferred herself EOB with extra time and was able to transfer to the [facility] while adhering to her TDWB precautions on her LLE. Patient was continent of bladder. SLP facilitated session by administering the [place] Status Examination (SLUMS). Patient scored  18/30 points with a score of 27 or above considered normal with deficits in recall and problem solving. Patient's breakfast arrived halfway through evaluation with patient able to alternate her attention between self-feeding and participating in evaluation with Mod I. Due patient's high-level of  independence at baseline, recommend skilled SLP intervention to maximize her cognitive functioning and overall functional independence in order to reduce caregiver burden. Patient verbalized understanding and agreement with recommendations.    Skilled Therapeutic Interventions          Administered a  cognitive-linguistic evaluation, please see above for details.   SLP Assessment  Patient will need skilled [place] [facility] during CIR admission    Recommendations  Recommendations for Other Services: Neuropsych consult Patient destination: Home Follow up Recommendations: 24 hour supervision/assistance;Outpatient SLP;Home Health SLP Equipment Recommended: None recommended by SLP    SLP Frequency 3 to 5 out of 7 days   SLP Duration  SLP Intensity  SLP Treatment/Interventions 10-12 days  Minumum of 1-2 x/day, 30 to 90 minutes  Cognitive remediation/compensation;Internal/external aids;Therapeutic Activities;Environmental controls;Cueing hierarchy;Functional tasks;Patient/family education    Pain Intermittent pain with mobility during transfer   SLP Evaluation Cognition Overall Cognitive Status: Impaired/Different from baseline Arousal/Alertness: Awake/alert Orientation Level: Oriented X4 Memory: Impaired Memory Impairment: Decreased short term memory;Decreased recall of new information Awareness: Appears intact Problem Solving: Impaired Problem Solving Impairment: Functional complex Safety/Judgment: Appears intact  Comprehension Auditory Comprehension Overall Auditory Comprehension: Appears within functional limits for tasks assessed Visual Recognition/Discrimination Discrimination: Not tested Reading Comprehension Reading Status: Not tested Expression Expression Primary Mode of Expression: Verbal Verbal Expression Overall Verbal Expression: Appears within functional limits for tasks assessed Written Expression Dominant Hand: Right Written Expression: Not tested Oral Motor Oral Motor/Sensory Function Overall Oral Motor/Sensory Function: Within functional limits Motor Speech Overall Motor Speech: Appears within functional limits for tasks assessed  Care Tool Care Tool Cognition Ability to hear (with hearing aid or hearing appliances if normally  used Ability to hear (with hearing aid or hearing appliances if normally used): 1. Minimal difficulty - difficulty in some environments (e.g. when person speaks softly or setting is noisy)   Expression of Ideas and Wants Expression of Ideas and Wants: 4. Without difficulty (complex and basic) - expresses complex messages without difficulty and with speech that is clear and easy to understand   Understanding Verbal and Non-Verbal Content Understanding Verbal and Non-Verbal Content: 3. Usually understands - understands most conversations, but misses some part/intent of message. Requires cues at times to understand  Memory/Recall Ability Memory/Recall Ability : Current season;That he or she is in a hospital/hospital unit   Short Term Goals: Week 1: SLP Short Term Goal 1 (Week 1): Patient will demonstrate functional problem solving for mildly complex tasks with supervision verbal cues. SLP Short Term Goal 2 (Week 1): Patient will recall new, daily information with Min cues for use of memory compensatory strategies.  Refer to Care Plan for Long Term Goals  Recommendations for other services: Neuropsych  Discharge Criteria: Patient will be discharged from SLP if patient refuses treatment 3 consecutive times without medical reason, if treatment goals not met, if there is a change in medical status, if patient makes no progress towards goals or if patient is discharged from hospital.  The above assessment, treatment plan, treatment alternatives and goals were discussed and mutually agreed upon: by patient  Tilly Pernice 04/07/2022, 10:54 AM

## 2022-04-07 NOTE — Progress Notes (Signed)
Spoke to patients daughter [name]. [name] states she will call dietary daily to order her mothers food. Provided daughter with dietary's phone number.

## 2022-04-07 NOTE — Progress Notes (Signed)
Lower extremity venous left study completed.   Please see CV Proc for preliminary results.   Lyndzee Kliebert, RDMS, RVT  

## 2022-04-07 NOTE — Progress Notes (Signed)
Physical Therapy Session Note  Patient Details  Name: Diana Frost MRN: [id number] Date of Birth: July 14, 1945  Today's Date: 04/07/2022 PT Individual Time: [phone] and 1430-1425 PT Individual Time Calculation (min): 77 min and 25 min   Short Term Goals: Week 1:  PT Short Term Goal 1 (Week 1): pt will perform bed mobility with CGA overall PT Short Term Goal 2 (Week 1): pt will transfer sit<>stand with LRAD and CGA PT Short Term Goal 3 (Week 1): pt will initate gait training with RW  Skilled Therapeutic Interventions/Progress Updates:   Treatment Session 1 Received pt semi-reclined in bed, pt agreeable to PT treatment, and reported pain 3/10 in L hip at rest increasing to 5/10 with mobility - pt declined any pain medication. Session with emphasis on functional mobility/transfers, dressing, simulated car transfers, dynamic standing balance/coordination, and BP management. Pt transferred semi-reclined<>sitting EOB with HOB elevated and supervision with increased time and use of bedrail. Pt denied any dizziness but required increased time to scoot to EOB. Donned R shoe with max A and pants with max A and pt stood with RW and light min A to pull pants over hips. Pt performed stand<>pivot transfer into WC with RW and min A (demonstrating improvements in LLE TDWB precautions with shoe on) - updated safety plan for nursing.  Pt sat in [facility] at sink and brushed teeth/washed face with set up assist; then pt's daughter arrived. Pt transported to/from room in [facility] dependently for time management purposes. Pt performed simulated car transfer via stand<>pivot with RW and mod A overall. Pt able to perform transfer with min A but required mod A for LLE management due to pain/weakness. Transported to dayroom and pt reported feeling dizzy. BP seated: 94/59 and in standing: 68/49 and pt extremely symptomatic. Donned ted hose with total A and reassessed BP; sitting: 110/54 (asymptomatic) and standing: 69/49  (symptomatic). Allowed time for symptoms to resolve and BP increased to 102/60 - treatment team notified. Pt performed WC mobility 144f using BUE and supervision back towards room - stopped due to UE fatigue and transported remainder of way in WC dependently. Updated pt's daughter on pt's current status and pt's daughter with concerns for pt's ability to be independent upon D/C, ultimately requesting extended stay - will discuss with treatment team in conference on 1/3. [facility], planning to take pt to [employer] to get coffee (has grounds pass).   Treatment Session 2 Received pt semi-reclined in bed, pt agreeable to PT treatment, and reported pain 4/10 in L hip. RN notified and offered Tylenol but pt reported being unable to take it with insulin pump - offered repositioning, activity modification, and heat/ice instead. Session with emphasis on generalized strengthening and endurance as pt requested to work on bed level exercises due to fatigue/pain. Pt performed the following exercises with emphasis on LE strength/ROM: -SLR 2x10 bilaterally - AAROM on LLE -hip abduction 2x10 bilaterally - decreased ROM on LLE -hip adduction towel squeezes 2x20 [facility] pulling in headboard and concluded session with pt semi-reclined in bed, needs within reach, and bed alarm on. Provided pt with heat packs and notified RN of pt's request to toilet at end of session.   Therapy Documentation Precautions:  Precautions Precautions: Fall Restrictions Weight Bearing Restrictions: Yes LLE Weight Bearing: Touchdown weight bearing  Therapy/Group: Individual Therapy AAlfonse AlpersPT, DPT  04/07/2022, 7:00 AM

## 2022-04-07 NOTE — Progress Notes (Signed)
PROGRESS NOTE   Subjective/Complaints: Patient appears to be brighter this morning and with more energy, she has eaten most of breakfast and is drinking coffee Was orthostatic to SBP 60s    ROS:  Pt denies SOB, abd pain, CP, N/V/C/D, and vision changes, +fatigue, +orthostasis  Objective:   No results found. Recent Labs    04/07/22 0531  WBC 6.3  HGB 9.6*  HCT 29.7*  PLT 343    Recent Labs    04/07/22 0531  NA 137  K 4.1  CL 100  CO2 28  GLUCOSE 212*  BUN 17  CREATININE 0.77  CALCIUM 8.7*     Intake/Output Summary (Last 24 hours) at 04/07/2022 1031 Last data filed at 04/07/2022 0707 Gross per 24 hour  Intake 340 ml  Output 50 ml  Net 290 ml        Physical Exam: Vital Signs Blood pressure 122/73, pulse 98, temperature 98.7 F (37.1 C), temperature source Oral, resp. rate 18, height '5\' 2"'$  (1.575 m), weight 53.8 kg, SpO2 94 %. 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 Functional deficits which require 3+ hours per day of interdisciplinary therapy in a comprehensive inpatient rehab  setting. Physiatrist is providing close team supervision and 24 hour management of active medical problems listed below. Physiatrist and rehab team continue to assess barriers to discharge/monitor patient progress toward functional and medical goals  Care Tool:  Bathing    Body parts bathed by patient: Right arm, Left arm, Chest, Abdomen, Right upper leg, Left upper leg, Face   Body parts bathed by helper: Front perineal area, Buttocks, Right lower leg, Left lower leg     Bathing assist Assist Level: Maximal Assistance - Patient 24 - 49%     Upper Body Dressing/Undressing Upper body dressing   What is the patient wearing?: Pull over shirt    Upper body assist Assist Level: Supervision/Verbal cueing    Lower Body Dressing/Undressing Lower body dressing      What is the patient wearing?: Incontinence brief, Pants     Lower body assist Assist for lower body dressing: Total Assistance - Patient < 25%     Toileting Toileting    Toileting assist Assist for toileting: Moderate Assistance - Patient 50 - 74%     Transfers Chair/bed transfer  Transfers assist     Chair/bed transfer assist level: Minimal Assistance - Patient > 75%  Locomotion Ambulation   Ambulation assist   Ambulation activity did not occur: Safety/medical concerns (fatigue, poor adherance to TDWB precautions)          Walk 10 feet activity   Assist  Walk 10 feet activity did not occur: Safety/medical concerns (fatigue, poor adherance to TDWB precautions)        Walk 50 feet activity   Assist Walk 50 feet with 2 turns activity did not occur: Safety/medical concerns (fatigue, poor adherance to TDWB precautions)         Walk 150 feet activity   Assist Walk 150 feet activity did not occur: Safety/medical concerns (fatigue, poor adherance to TDWB precautions)         Walk 10 feet on uneven surface  activity   Assist Walk 10 feet on uneven surfaces activity did not occur:  Safety/medical concerns (fatigue, poor adherance to TDWB precautions)         Wheelchair     Assist Is the patient using a wheelchair?: Yes Type of Wheelchair: Manual    Wheelchair assist level: Supervision/Verbal cueing Max wheelchair distance: 138f    Wheelchair 50 feet with 2 turns activity    Assist        Assist Level: Supervision/Verbal cueing   Wheelchair 150 feet activity     Assist      Assist Level: Supervision/Verbal cueing   Blood pressure 122/73, pulse 98, temperature 98.7 F (37.1 C), temperature source Oral, resp. rate 18, height '5\' 2"'$  (1.575 m), weight 53.8 kg, SpO2 94 %.  Medical Problem List and Plan: 1. Functional deficits secondary to Left femoral neck fracture s/p percutaneous fixation on 03/29/22             -patient may shower, please cover incision             -ELOS/Goals: 12-14 days , PT/OT supervision to mod I             Con't CIR- PT and OT-   Vitamin D supplement started 2.  Antithrombotics: -DVT/anticoagulation:  Pharmaceutical: Lovenox             -antiplatelet therapy: ASA '81mg'$  3. Pain Management: Hydrocodone prn severe pain.   12/30- Norco was d/c;d yesterday due to "lack of use"- will restart since hurting more with therapy.  4. Mood/Behavior/Sleep: LCSW to follow for evaluation and support.              -antipsychotic agents: N/A 5. Neuropsych/cognition: This patient is capable of making decisions on her own behalf. 6. Skin/Wound Care: Routine pressure relief measures.  7. Fluids/Electrolytes/Nutrition: Monitor I/O. Check CMET in am.  8. Left subcapital femur Fx s/p fixation: TDWB per Dr. HEarnestine Leys9.T1DM with prior episodes of hypoglycemia: continue omnipod and dexcom --will consult diabetic coordinator to assist with management.              --Followed by Dr TMee Hives 10. Lightheadedness/weakness: Question due to orthostatic v/s neurogenic due to DM.  --Also reports of diplopia per optho notes.  -  Continue Midodrine for BP support. Compression garments ordered for during therapy.  11. CAD/Blood pressure: Denies chest pain 12. Depression/anxiety?: Continue Zoloft and Bupropion.  13. Acute blood loss anemia: Stable around 9.0. Recheck CBC in am.              -Continue iron supplementation 14. Hypokalemia: Was supplemented X 1 today. K+ 3.4 12/28. Recheck in am.   12/30- K+ 3.5- will recheck Monday 15. Constipation: changed senna to  HS  12/30- LBM 2 days ago- is normal for her to go q2-3 days.  16. Fatigue: checked B12 level and is normal 17. Suboptimal magnesium: supplemented 2grams IV on 12/28, repeat tomorrow 18. Radiation induced nausea: discussed hyperbaric oxygen therapy outpatient. Scheduled compazine prior to breakfast. Resolved.  19. Suboptimal vitamin D level: ergocalciferol 50,000U ordered q7 days   LOS: 5 days A FACE TO FACE EVALUATION WAS PERFORMED  Martha Clan P Amsi Grimley 04/07/2022, 10:31 AM

## 2022-04-07 NOTE — Plan of Care (Signed)
  Problem: RH Problem Solving Goal: LTG Patient will demonstrate problem solving for (SLP) Description: LTG:  Patient will demonstrate problem solving for basic/complex daily situations with cues  (SLP) Flowsheets (Taken 04/07/2022 1102) LTG: Patient will demonstrate problem solving for (SLP): Complex daily situations LTG Patient will demonstrate problem solving for: Supervision   Problem: RH Memory Goal: LTG Patient will use memory compensatory aids to (SLP) Description: LTG:  Patient will use memory compensatory aids to recall biographical/new, daily complex information with cues (SLP) Flowsheets (Taken 04/07/2022 1102) LTG: Patient will use memory compensatory aids to (SLP): Minimal Assistance - Patient > 75%

## 2022-04-07 NOTE — Progress Notes (Signed)
Pt did not receive breakfast tray. Called dietary for bacon and eggs per pt's request.

## 2022-04-08 DIAGNOSIS — I959 Hypotension, unspecified: Secondary | ICD-10-CM | POA: Diagnosis not present

## 2022-04-08 LAB — GLUCOSE, CAPILLARY
Glucose-Capillary: 202 mg/dL — ABNORMAL HIGH (ref 70–99)
Glucose-Capillary: 170 mg/dL — ABNORMAL HIGH (ref 70–99)
Glucose-Capillary: 157 mg/dL — ABNORMAL HIGH (ref 70–99)
Glucose-Capillary: 187 mg/dL — ABNORMAL HIGH (ref 70–99)
Glucose-Capillary: 228 mg/dL — ABNORMAL HIGH (ref 70–99)

## 2022-04-08 MED ORDER — MAGNESIUM SULFATE IN D5W 1-5 GM/100ML-% IV SOLN
1.0000 g | Freq: Once | INTRAVENOUS | Status: AC
  Administered 2022-04-08: 1 g via INTRAVENOUS
  Filled 2022-04-08: qty 100

## 2022-04-08 MED ORDER — HYDROCODONE-ACETAMINOPHEN 5-325 MG PO TABS
1.0000 | ORAL_TABLET | ORAL | Status: DC | PRN
  Administered 2022-04-08 – 2022-04-09 (×2): 1 via ORAL
  Filled 2022-04-08 (×2): qty 1

## 2022-04-08 NOTE — Progress Notes (Signed)
PROGRESS NOTE   Subjective/Complaints: Looking and feeling much better this morning She found the heating pad very helpful for her shoulders and back   ROS:  Pt denies SOB, abd pain, CP, N/V/C/D, and vision changes, +fatigue, +orthostasis, +shoulder pain  Objective:   VAS [name] LOWER EXTREMITY VENOUS (DVT)  Result Date: 04/08/2022  Lower Venous DVT Study Patient Name:  Diana Frost  Date of Exam:   04/07/2022 Medical Rec #: [id number]          Accession #:    [id number] Date of Birth: 1945-05-20         Patient Gender: F Patient Age:   77 years Exam Location:  [facility] Procedure:      VAS [name] LOWER EXTREMITY VENOUS (DVT) Referring Phys: Leeroy Cha --------------------------------------------------------------------------------  Indications: Pain and swelling left leg, s/p percutaneous fixation of femoral neck.  Comparison Study: No prior studies. Performing Technologist: Darlin Coco RDMS, RVT  Examination Guidelines: A complete evaluation includes B-mode imaging, spectral Doppler, color Doppler, and power Doppler as needed of all accessible portions of each vessel. Bilateral testing is considered an integral part of a complete examination. Limited examinations for reoccurring indications may be performed as noted. The reflux portion of the exam is performed with the patient in reverse Trendelenburg.  +-----+---------------+---------+-----------+----------+--------------+ RIGHTCompressibilityPhasicitySpontaneityPropertiesThrombus Aging +-----+---------------+---------+-----------+----------+--------------+ CFV  Full           Yes      Yes                                 +-----+---------------+---------+-----------+----------+--------------+   +---------+---------------+---------+-----------+----------+--------------+ LEFT     CompressibilityPhasicitySpontaneityPropertiesThrombus Aging  +---------+---------------+---------+-----------+----------+--------------+ CFV      Full           Yes      Yes                                 +---------+---------------+---------+-----------+----------+--------------+ SFJ      Full                                                        +---------+---------------+---------+-----------+----------+--------------+ FV Prox  Full                                                        +---------+---------------+---------+-----------+----------+--------------+ FV Mid   Full                                                        +---------+---------------+---------+-----------+----------+--------------+ FV DistalFull                                                        +---------+---------------+---------+-----------+----------+--------------+  PFV      Full                                                        +---------+---------------+---------+-----------+----------+--------------+ POP      Full           Yes      Yes                                 +---------+---------------+---------+-----------+----------+--------------+ PTV      Full                                                        +---------+---------------+---------+-----------+----------+--------------+ PERO     Full                                                        +---------+---------------+---------+-----------+----------+--------------+    Summary: RIGHT: - No evidence of common femoral vein obstruction.  LEFT: - There is no evidence of deep vein thrombosis in the lower extremity.  - No cystic structure found in the popliteal fossa.  *See table(s) above for measurements and observations. Electronically signed by Deitra Mayo MD on 04/08/2022 at 6:41:20 AM.    Final    Recent Labs    04/07/22 0531  WBC 6.3  HGB 9.6*  HCT 29.7*  PLT 343    Recent Labs    04/07/22 0531  NA 137  K 4.1  CL 100  CO2 28  GLUCOSE 212*   BUN 17  CREATININE 0.77  CALCIUM 8.7*     Intake/Output Summary (Last 24 hours) at 04/08/2022 1056 Last data filed at 04/08/2022 0700 Gross per 24 hour  Intake 580 ml  Output --  Net 580 ml        Physical Exam: Vital Signs Blood pressure 123/69, pulse 99, temperature 98.7 F (37.1 C), temperature source Oral, resp. rate 16, height '5\' 2"'$  (1.575 m), weight 53.8 kg, SpO2 92 %. 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 Functional deficits which require 3+ hours per day of interdisciplinary therapy in a comprehensive inpatient rehab setting. Physiatrist is providing  close team supervision and 24 hour management of active medical problems listed below. Physiatrist and rehab team continue to assess barriers to discharge/monitor patient progress toward functional and medical goals  Care Tool:  Bathing    Body parts bathed by patient: Right arm, Left arm, Chest, Abdomen, Right upper leg, Left upper leg, Face   Body parts bathed by helper: Front perineal area, Buttocks, Right lower leg, Left lower leg     Bathing assist Assist Level: Maximal  Assistance - Patient 24 - 49%     Upper Body Dressing/Undressing Upper body dressing   What is the patient wearing?: Pull over shirt    Upper body assist Assist Level: Supervision/Verbal cueing    Lower Body Dressing/Undressing Lower body dressing      What is the patient wearing?: Incontinence brief, Pants     Lower body assist Assist for lower body dressing: Total Assistance - Patient < 25%     Toileting Toileting    Toileting assist Assist for toileting: Moderate Assistance - Patient 50 - 74%     Transfers Chair/bed transfer  Transfers assist     Chair/bed transfer assist level: Minimal Assistance - Patient > 75%     Locomotion Ambulation   Ambulation assist   Ambulation activity did not occur: Safety/medical concerns (fatigue, poor adherance to TDWB precautions)          Walk 10 feet activity   Assist  Walk 10 feet activity did not occur: Safety/medical concerns (fatigue, poor adherance to TDWB precautions)        Walk 50 feet activity   Assist Walk 50 feet with 2 turns activity did not occur: Safety/medical concerns (fatigue, poor adherance to TDWB precautions)         Walk 150 feet activity   Assist Walk 150 feet activity did not occur: Safety/medical concerns (fatigue, poor adherance to TDWB precautions)         Walk 10 feet on uneven surface  activity   Assist Walk 10 feet on uneven surfaces activity did not occur: Safety/medical concerns (fatigue, poor adherance to TDWB precautions)         Wheelchair     Assist Is the patient using a wheelchair?: Yes Type of Wheelchair: Manual    Wheelchair assist level: Supervision/Verbal cueing Max wheelchair distance: 180f    Wheelchair 50 feet with 2 turns activity    Assist        Assist Level: Supervision/Verbal cueing   Wheelchair 150 feet activity     Assist      Assist Level: Supervision/Verbal cueing   Blood pressure 123/69, pulse 99,  temperature 98.7 F (37.1 C), temperature source Oral, resp. rate 16, height '5\' 2"'$  (1.575 m), weight 53.8 kg, SpO2 92 %.  Medical Problem List and Plan: 1. Functional deficits secondary to Left femoral neck fracture s/p percutaneous fixation on 03/29/22             -patient may shower, please cover incision             -ELOS/Goals: 12-14 days , PT/OT supervision to mod I             Con't CIR- PT and OT-   Vitamin D supplement started  -Interdisciplinary Team Conference today   2.  Impaired mobility: UKoreareviewed and is negative for clots. Continue Lovenox             -antiplatelet therapy: ASA '81mg'$  3. Pain from hip fracture: decrease Norco to '5mg'$  due  to hypotension  4. Mood/Behavior/Sleep: LCSW to follow for evaluation and support.              -antipsychotic agents: N/A 5. Neuropsych/cognition: This patient is capable of making decisions on her own behalf. 6. Skin/Wound Care: Routine pressure relief measures.  7. Fluids/Electrolytes/Nutrition: Monitor I/O. Check CMET in am.  8. Left subcapital femur Fx s/p fixation: TDWB per Dr. Earnestine Leys 9.T1DM with prior episodes of hypoglycemia: continue omnipod and dexcom --will consult diabetic coordinator to assist with management.              --Followed by Dr Mee Hives. 10. Orthostatic hypotension: abdominal binder ordered.  --Also reports of diplopia per optho notes.  - Continue Midodrine for BP support. Compression garments ordered for during therapy.  11. CAD/Blood pressure: Denies chest pain 12. Depression/anxiety?: Continue Zoloft and Bupropion.  13. Acute blood loss anemia: Stable around 9.0. Recheck CBC in am.              -Continue iron supplementation 14. Hypokalemia: Was supplemented X 1 today. K+ 3.4 12/28. Recheck in am.   12/30- K+ 3.5- will recheck Monday 15. Constipation: changed senna to HS  12/30- LBM 2 days ago- is normal for her to go q2-3 days.  16. Fatigue: checked B12 level and is normal 17. Suboptimal  magnesium: supplemented 2grams IV on 12/28, 1gm ordered 1/3. 18. Radiation induced nausea: discussed hyperbaric oxygen therapy outpatient. Scheduled compazine prior to breakfast. Resolved.  19. Suboptimal vitamin D level: ergocalciferol 50,000U ordered q7 days   LOS: 6 days A FACE TO FACE EVALUATION WAS PERFORMED  Diana Frost 04/08/2022, 10:56 AM

## 2022-04-08 NOTE — Progress Notes (Signed)
Physical Therapy Session Note  Patient Details  Name: Diana Frost MRN: [id number] Date of Birth: Feb 01, 1946  Today's Date: 04/08/2022 PT Individual Time: 1415-1530 PT Individual Time Calculation (min): 75 min   Short Term Goals: Week 1:  PT Short Term Goal 1 (Week 1): pt will perform bed mobility with CGA overall PT Short Term Goal 2 (Week 1): pt will transfer sit<>stand with LRAD and CGA PT Short Term Goal 3 (Week 1): pt will initate gait training with RW  Skilled Therapeutic Interventions/Progress Updates:  Patient supine in bed on entrance to room. Patient alert and agreeable to PT session. Setup with IV medication on entrance to room but completes prior to leaving room and able to disconnect line.   Patient with no pain complaint at start of session. But does relate that hip pain increases with WB. She constantly feels low grade pain but relates it is tolerable.   Pt has TED hose donned but requires repositioning of hose. Remarks that she does not have new abdominal binder on. Education provided that body has difficulty regulating BP when adjusting body position against gravity, so abdominal binder is not necessary in bed.  Therapeutic Activity: Bed Mobility: Pt performed supine --> sit requiring extra time to reach EOB with supervision/ CGA. Minimal vc/ tc required for reaching full turn to EOB and bringing feet to floor. Transfers: Pt performed sit<>stand and stand pivot transfers throughout session with overall CGA for balance. One instance of backward LOB during stance upon standing requiring light MinA to correct. Provided verbal cues for maintaining L foot ahead of RLE for TDWB precautions.  Orthostatic vitals taken prior to donning abdominal binder as MD note relates adjustment of medication to help with orthostasis. Each reading taken after 3-25mn in position.   Orthostatic VS for the past 24 hrs (Last 3 readings):  BP- Lying Pulse- Lying BP- Sitting Pulse- Sitting BP-  Standing at 3 minutes Pulse- Standing at 3 minutes  04/08/22 1700 103/55 95 115/63 97 118/70 110     Gait Training/ NMR:  Prior to ambulation, education on TDWB and need for BW through BUE on RW and maintaining L toe ahead of R foot throughout. Pt ambulated 9' x1 using RW with good technique and light CGA. Brief seated rest break prior to next amb bout. Pt then amb 27' x1 using RW with variable adherence to TDWB and sometimes demonstrating PWB. Extensive vc throughout for maintaining TDWB with RLE never to step to or past LLE and always hop-to behind LLE. Third bout slightly improved in technique but continuing to require consistent vc for TDWB technique.   Pt guided in seated then standing toe touch to 4" step. Guided in technique of hip flexion to improve knee height and kick out of foot to place heel on step. Good raise of knee height with good hip flexion and HS activation for pulling heel back to place floor without dragging bottom of shoe on step. Good performance 2 bouts of 10 reps.   NMR performed for improvements in motor control and coordination, balance, sequencing, judgement, and self confidence/ efficacy in performing all aspects of mobility at highest level of independence.   On return to bedside, pt is able to bring LLE to bed surface and require CGA/ very light MinA for RLE to bed surface.  Patient supine at end of session with brakes locked, bed alarm set, and all needs within reach.  Therapy Documentation Precautions:  Precautions Precautions: Fall Restrictions Weight Bearing Restrictions: Yes LLE  Weight Bearing: Touchdown weight bearing General:   Vital Signs:   Pain: No complaint of pain throughout session. Related to Rn that pt may require pain medication following session.   Therapy/Group: Individual Therapy  Alger Simons PT, DPT, CSRS 04/08/2022, 2:30 PM

## 2022-04-08 NOTE — Progress Notes (Signed)
Physical Therapy Session Note  Patient Details  Name: Diana Frost MRN: [id number] Date of Birth: 10-22-1945  Today's Date: 04/08/2022 PT Individual Time: 0800-0915 PT Individual Time Calculation (min): 75 min   Short Term Goals: Week 1:  PT Short Term Goal 1 (Week 1): pt will perform bed mobility with CGA overall PT Short Term Goal 2 (Week 1): pt will transfer sit<>stand with LRAD and CGA PT Short Term Goal 3 (Week 1): pt will initate gait training with RW  Skilled Therapeutic Interventions/Progress Updates:  Patient greeted supine, asleep in bed but easily arousable and agreeable to PT treatmetn session. Patient transitioned from semi-reclined to sitting EOB with Supv and use of bed rail with increased time required to complete. Patient requesting to use the restroom and sponge off prior to heading to the gym for therapy. Patient performed sit/stand with RW and CGA patient then performed stand pivot transfer to wheelchair with MinA and therapist's foot underneath her L foot in order to ensure maintaining WB restrictions. Patient wheeled into the bathroom where she performed sit/stand and stan pivot transfer with RW and CGA for safety- VC throughout for maintaining WB restrictions. Patient with continent urine and BM- Patient was able to stand from toilet with RW and grab bar and VC for proper hand placement. While standing therapist performed pericare. Patient then pivoted toward shower bench where she sat and performed a sponge bath with set-up assistance. Patient was able to then don sweatshirt and required therapist to thread pants, don brief and assist with socks and shoes. Patient stood with RW and MinA and while standing therapist pulled pants over hips. Patient then pivoted to the wheelchair with RW and CGA for safety. Patient sat at the sink where she was able to wash her face, brush her teeth and brush her hair independently.   Patient propelled manual wheelchair from room to ortho  gym with B UE and supv- VC for improved propulsion technique with good improvements noted, however increased time required to complete.   Patient performed sit/stand x5 with RW and CGA- VC for proper hand placement prior to standing/sitting with inconsistent carryover noted. Patient placed L foot on top of therapist's foot in order to ensure maintaining WB restrictions with good adherence noted.   Patient gait trained x8' with RW and CGA/MinA for improved safety/stability- Therapist placed patient's L foot on top of therapist's foot in order to ensure maintaining WB restrictions throughout gait trial. Patient demonstrated increased WB through L foot with VC throughout and demonstration for pushing through UE and R LE. Patient required a seated rest break after gait trial secondary to fatigue.   Patient returned to her room sitting upright in wheelchair with call bell within reach and all needs met.    Therapy Documentation Precautions:  Precautions Precautions: Fall Restrictions Weight Bearing Restrictions: Yes LLE Weight Bearing: Weight bearing as tolerated   Therapy/Group: Individual Therapy  Celinda Dethlefs 04/08/2022, 7:59 AM

## 2022-04-08 NOTE — Patient Care Conference (Signed)
Inpatient RehabilitationTeam Conference and Plan of Care Update Date: 04/08/2022   Time: 11:37 AM    Patient Name: Diana Frost      Medical Record Number: [id number]  Date of Birth: Jan 10, 1946 Sex: Female         Room/Bed: 4M12C/4M12C-01 Payor Info: Payor: MEDICARE / Plan: MEDICARE PART A AND B / Product Type: *No Product type* /    Admit Date/Time:  04/02/2022  2:27 PM  Primary Diagnosis:  Femur fracture, left [facility])  Hospital Problems: Principal Problem:   Femur fracture, left [facility])    Expected Discharge Date: Expected Discharge Date: 04/23/22  Team Members Present: Physician leading conference: Dr. Leeroy Cha Social Worker Present: Erlene Quan, BSW Nurse Present: Dorien Chihuahua, RN PT Present: Terence Lux, PT OT Present: Jennefer Bravo, OT SLP Present: Weston Anna, SLP PPS Coordinator present : Gunnar Fusi, SLP     Current Status/Progress Goal Weekly Team Focus  Bowel/Bladder   Continent of bowel, Con/incon of bladder.   Regain full continence of bladder   Assist with toileting as needed    Swallow/Nutrition/ Hydration               ADL's   Supervison UB, Mod-max A LB, mod-max A toileting   Mod I   ADL retraining, functional endurance, AE education, functional mobility/transfers    Mobility   bed mobility min A for LLE management, sit<>stands and stand<>pivot transfers with RW and min A, WC mobility 175f using BUEs and supervision. Unable to ambulate due to difficulty maintaining LLE TDWB precautions. Pt does better with WB precautions when wearing R shoe.   Mod I transfers, supervision bed mobility, gait, and WC mobility, CGA car transfer  functional mobility/transfers, generalized strengthening and endurance, dynamic standing balance/coordination, D/C planning, equipment management, family education, pain management, and adherance to LLE TDWB precautions.    Communication                Safety/Cognition/ Behavioral Observations   Min-Mod A   Supervision-Min A   functional problem solving and recall with use of strategies    Pain   Pain to left surgical site.   Pain <3/10   Assess Qshift and prn    Skin   Surgical incision left hip   Maintain skin integrity  Assess Qshift and prn      Discharge Planning:  Discharging home with assistance from daughter and hired caregivers. Spouse is bedbound, daughter anticipates asssiting both.   Team Discussion: Patient with hypotension; using TEDs, binder and MD adjusting medications. Heating pad for pain management.  T1 DM using dexcom and omnipod. Reports Left LE pain; doppler ordered per MD.  Progress limited by pain, orthostasis and inability to maintain weight bearing precautions.   Patient on target to meet rehab goals: no, currently needs supervision for upper body care,  mod - max assist for toileting. Completes sit - stand with CGA and w/c management with supervision. Goals for dsicharge set for mod I overall and CGA for car transfers  *See Care Plan and progress notes for long and short-term goals.   Revisions to Treatment Plan:  SLP eval for decline in cognition post radiation Gait training Car transfer training Extended stay    Teaching Needs: Safety, weight bearing precautions,   Current Barriers to Discharge: Decreased caregiver support and Weight bearing restrictions  Possible Resolutions to Barriers: Family education     Medical Summary Current Status: hypotension, pain from femur fracture/cervical myofascial syndrome/lumbago, chronic nausea, type 1 diabetes with  hyperglycemia  Barriers to Discharge: Medical stability  Barriers to Discharge Comments: hypotension, pain from femur fracture/cervical myofascial syndrome/lumbago, chronic nausea, type 1 diabetes with hyperglycemia Possible Resolutions to [employer] Focus: continue to monitor blood pressure TID, decrease Norco to '5mg'$  q4H prn, continue aquathermia, continue daily scheduled IV  compazine   Continued Need for Acute Rehabilitation Level of Care: The patient requires daily medical management by a physician with specialized training in physical medicine and rehabilitation for the following reasons: Direction of a multidisciplinary physical rehabilitation program to maximize functional independence : Yes Medical management of patient stability for increased activity during participation in an intensive rehabilitation regime.: Yes Analysis of laboratory values and/or radiology reports with any subsequent need for medication adjustment and/or medical intervention. : Yes   I attest that I was present, lead the team conference, and concur with the assessment and plan of the team.   Dorien Chihuahua B 04/08/2022, 1:43 PM

## 2022-04-08 NOTE — Progress Notes (Signed)
Occupational Therapy Session Note  Patient Details  Name: Diana Frost MRN: [id number] Date of Birth: September 27, 1945  Today's Date: 04/08/2022 OT Individual Time: 1005-1045 OT Individual Time Calculation (min): 40 min    Short Term Goals: Week 1:  OT Short Term Goal 1 (Week 1): Pt will complete 1/3 toileting steps with CGA for standing balance using RW OT Short Term Goal 2 (Week 1): Pt will complete toilet transfer with CGA using RW OT Short Term Goal 3 (Week 1): Pt will use AE PRN during LB clothing with min cues needed for technique  Skilled Therapeutic Interventions/Progress Updates:  Skilled OT intervention completed with focus on BP management, functional transfers, sitting balance, BUE endurance. Pt received seated in w/c, agreeable to session. Un-rated pain reported in LLE however improved from yesterday. Pt pre-medicated. Therapist offered rest breaks and repositioning throughout for pain reduction.  Pt declined self-care needs. Requested to lay back down due to fatigue however agreeable to BP assessment and in room therapy.  Vitals assessed as follows: -BP 123/66, HR 99 bpm; sitting in w/c- asymptomatic; abdominal binder already donned -BP 84/55, HR 55 bpm; standing- asymptomatic with binder only; donned knee high TEDS with total A -BP 122/75, HR 102 bpm; sitting EOB after exercises; asymptomatic  Completed sit > stands with CGA using RW, and then stand pivot with min A to EOB using RW with pt displaying good adherence to TDWB on LLE as R shoe was donned. Completed the following exercises seated EOB for sitting balance, BUE endurance and BP management: (With 3 pound dowel 2x10) -chest press, overhead press, bicep flexion Cues needed for form and technique with pt completing the exercises very slowly and with supervision needed for sitting balance 2/2 mild posterior bias 2/2 core weakness  Doffed abdominal binder in prep for supine position. Min A needed for transition into bed,  mod cues for avoiding WB through LLE when bridging to reposition in bed. Pt remained upright in bed, with bed alarm on/activated, and with all needs in reach at end of session.   Therapy Documentation Precautions:  Precautions Precautions: Fall Restrictions Weight Bearing Restrictions: Yes LLE Weight Bearing: Touchdown weight bearing    Therapy/Group: Individual Therapy  Blase Mess, MS, OTR/L  04/08/2022, 11:54 AM

## 2022-04-08 NOTE — Progress Notes (Signed)
1700 glucose check: 187. Patient is unable to coordinate use of insulin pump at this time. She is A&OX4 since morning but forgetful of the process of administering units through insulin pump as of right now. Daughter was called - no answer. Linna Hoff, [place], notified.

## 2022-04-08 NOTE — Progress Notes (Signed)
Daughter called back and walked nurse through administration of Dexcom G6 (found in pink fanny-pack) and administered insulin of 1.4U.  On call PA Dan notified.

## 2022-04-08 NOTE — Progress Notes (Signed)
Team Conference Report to Patient/Family  Team Conference discussion was reviewed with the patient and caregiver, including goals, any changes in plan of care and target discharge date.  Patient and caregiver express understanding and are in agreement.  The patient has a target discharge date of 04/23/22.  Sw spoke with patient's daughter, [name] and provided conference updates. Daughter shares that she will not be at the facility today due to tire issues. Daughter glad patient is doing better and that patient was picked up for SLP. No additional questions or concerns.  [name] 04/08/2022, 1:34 PM

## 2022-04-09 DIAGNOSIS — I959 Hypotension, unspecified: Secondary | ICD-10-CM | POA: Diagnosis not present

## 2022-04-09 LAB — GLUCOSE, CAPILLARY
Glucose-Capillary: 359 mg/dL — ABNORMAL HIGH (ref 70–99)
Glucose-Capillary: 322 mg/dL — ABNORMAL HIGH (ref 70–99)
Glucose-Capillary: 416 mg/dL — ABNORMAL HIGH (ref 70–99)
Glucose-Capillary: 245 mg/dL — ABNORMAL HIGH (ref 70–99)
Glucose-Capillary: 172 mg/dL — ABNORMAL HIGH (ref 70–99)
Glucose-Capillary: 229 mg/dL — ABNORMAL HIGH (ref 70–99)
Glucose-Capillary: 241 mg/dL — ABNORMAL HIGH (ref 70–99)
Glucose-Capillary: 298 mg/dL — ABNORMAL HIGH (ref 70–99)

## 2022-04-09 MED ORDER — INSULIN ASPART 100 UNIT/ML IJ SOLN
0.0000 [IU] | Freq: Three times a day (TID) | INTRAMUSCULAR | Status: DC
  Administered 2022-04-09 (×2): 2 [IU] via SUBCUTANEOUS
  Administered 2022-04-10 (×2): 4 [IU] via SUBCUTANEOUS
  Administered 2022-04-11: 1 [IU] via SUBCUTANEOUS
  Administered 2022-04-11: 5 [IU] via SUBCUTANEOUS
  Administered 2022-04-11: 4 [IU] via SUBCUTANEOUS
  Administered 2022-04-12: 2 [IU] via SUBCUTANEOUS
  Administered 2022-04-12: 3 [IU] via SUBCUTANEOUS
  Administered 2022-04-13 (×2): 2 [IU] via SUBCUTANEOUS
  Administered 2022-04-14: 4 [IU] via SUBCUTANEOUS
  Administered 2022-04-14: 2 [IU] via SUBCUTANEOUS

## 2022-04-09 MED ORDER — INSULIN ASPART 100 UNIT/ML IJ SOLN
5.0000 [IU] | Freq: Once | INTRAMUSCULAR | Status: AC
  Administered 2022-04-09: 5 [IU] via SUBCUTANEOUS

## 2022-04-09 MED ORDER — INSULIN GLARGINE-YFGN 100 UNIT/ML ~~LOC~~ SOLN
10.0000 [IU] | Freq: Every day | SUBCUTANEOUS | Status: DC
  Administered 2022-04-09 – 2022-04-10 (×2): 10 [IU] via SUBCUTANEOUS
  Filled 2022-04-09 (×2): qty 0.1

## 2022-04-09 MED ORDER — INSULIN GLARGINE-YFGN 100 UNIT/ML ~~LOC~~ SOLN
5.0000 [IU] | Freq: Every day | SUBCUTANEOUS | Status: DC
  Filled 2022-04-09: qty 0.05

## 2022-04-09 MED ORDER — INSULIN ASPART 100 UNIT/ML IJ SOLN: 0.0000 [IU] | Freq: Three times a day (TID) | INTRAMUSCULAR | Status: DC

## 2022-04-09 MED ORDER — INSULIN ASPART 100 UNIT/ML IJ SOLN
0.0000 [IU] | Freq: Every day | INTRAMUSCULAR | Status: DC
  Administered 2022-04-09: 3 [IU] via SUBCUTANEOUS
  Administered 2022-04-13: 4 [IU] via SUBCUTANEOUS
  Administered 2022-04-14: 2 [IU] via SUBCUTANEOUS
  Administered 2022-04-17: 3 [IU] via SUBCUTANEOUS

## 2022-04-09 NOTE — Progress Notes (Addendum)
Speech Language Pathology Daily Session Note  Patient Details  Name: Diana Frost MRN: [id number] Date of Birth: 22-Apr-1945  Today's Date: 04/09/2022 SLP Individual Time: 1250-1345 SLP Individual Time Calculation (min): 55 min  Short Term Goals: Week 1: SLP Short Term Goal 1 (Week 1): Patient will demonstrate functional problem solving for mildly complex tasks with supervision verbal cues. SLP Short Term Goal 2 (Week 1): Patient will recall new, daily information with Min cues for use of memory compensatory strategies.  Skilled Therapeutic Interventions: Pt seen this date for skilled ST intervention targeting cognitive goals outlined above. Pt received awake/alert and OOB in w/c; recently finished toileting with NT. Agreeable to intervention in hospital room. Pleasant and cooperative throughout. Concerned for her husband who she thought was having another stroke; she later found out that it was not another stroke.  Today's session with emphasis on functional cognitive skill training. Additional cognitive assessment performed, to further inform ST POC, with pt scoring WFL on COGNISTAT attention, registration, naming, and mental math calculation subtest's. Borderline impairment noted in orientation and mild impairment noted in verbal abstract reasoning, verbal judgment, and delayed recall. Pt also reporting difficulty with sequencing. Complete functional verbal and visual sequencing task r/t ADL tasks that pt completed prior to admission (e.g. simple meal prep, laundry). To complete this activity successfully, pt benefited from overall Min-Mod A verbal and visual cues throughout; unable to fade cueing. Pt endorses that her daughter completed all IADL tasks prior to admission, and will continue to complete once pt discharges. Pt reports rather sedentary lifestyle. Benefited from [place] A verbal cues for delayed recall of morning activities. Pt did report decreased recall of sequence for utilizing  insulin pump recently, though verbalized that she was able to figure it out with the assistance of her daughter.  Pt left in room and OOB in w/c with all safety measures activated and call bell within reach. Continue per current ST POC. Will plan to continue addressing basic + functional cognitive skills.  Pain No pain reported; NAD  Therapy/Group: Individual Therapy  Meghanne Pletz A Deaundra Dupriest 04/09/2022, 1:56 PM

## 2022-04-09 NOTE — Inpatient Diabetes Management (Addendum)
When starting new basal and bolus insulin, recommend leaving the insulin pump on until the Semglee 10 units has been given and wait 1 hour. After the 1 hour when discontinuing the insulin pump, check a blood sugar and cover the blood sugar at that time with the Novolog 0-9 units correction scale as ordered. Follow orders as written.   CBG is 416 mg/dl at 0948. Spoke with staff RN and Reesa Chew on the phone. ADDENDUM: New order for Novolog 5 units to be given now. After giving Novolog 5 units, give the Semglee 10 units as soon as received to floor. Check blood sugar about 2 hours after giving the Novolog to make sure patient's blood sugar is coming down. Do not cover with Novolog correction scale 2 hours after giving Novolog 5 units now.  Start the Novolog 0-6 units correction scale at supper time.    Harvel Ricks RN BSN CDE Diabetes Coordinator Pager: [phone]  8am-5pm

## 2022-04-09 NOTE — Evaluation (Signed)
Recreational Therapy Assessment and Plan  Patient Details  Name: Diana Frost MRN: [id number] Date of Birth: 1945/07/14 Today's Date: 04/09/2022  Rehab Potential:  Good ELOS:   d/c 1/18  Assessment Hospital Problem: Principal Problem:   Femur fracture, left [facility]     Past Medical History:      Past Medical History:  Diagnosis Date   Coronary artery disease     Diabetes mellitus without complication ([place])      insulin pump   Headache      since ear problem started   Hypertension     Myocardial infarction ([place]) 11/01/13    Past Surgical History:       Past Surgical History:  Procedure Laterality Date   ABDOMINAL HYSTERECTOMY       BACK SURGERY        tail bone removed after fall/fracture   CATARACT EXTRACTION BILATERAL W/ ANTERIOR VITRECTOMY       CORONARY ANGIOPLASTY WITH STENT PLACEMENT   11/01/2013    [facility], stent x2   EYE SURGERY       HIP PINNING,CANNULATED Right 01/07/2019    Procedure: CANNULATED HIP PINNING;  Surgeon: Corky Mull, MD;  Location: [facility] ORS;  Service: Orthopedics;  Laterality: Right;   HIP PINNING,CANNULATED Left 03/29/2022    Procedure: PERCUTANEOUS FIXATION OF FEMORAL NECK;  Surgeon: Earnestine Leys, MD;  Location: [facility] ORS;  Service: Orthopedics;  Laterality: Left;   LEFT HEART CATH AND CORONARY ANGIOGRAPHY N/A 08/14/2016    Procedure: Left Heart Cath and Coronary Angiography;  Surgeon: Minna Merritts, MD;  Location: [place] [facility];  Service: Cardiovascular;  Laterality: N/A;   MELANOMA EXCISION Right      arm with skin graft   MYRINGOTOMY WITH TUBE PLACEMENT Left 08/21/2015    Procedure: MYRINGOTOMY WITH TUBE PLACEMENT;  Surgeon: Carloyn Manner, MD;  Location: [place];  Service: ENT;  Laterality: Left;   NASOPHARYNGEAL BIOPSY N/A 11/07/2015    Procedure: NASOPHARYNGEAL BIOPSY;  Surgeon: Carloyn Manner, MD;  Location: [facility] ORS;  Service: ENT;  Laterality: N/A;   TONSILLECTOMY          Assessment &  Plan Clinical Impression: Patient is a 77 y.o. year old female with history of T1DM on insulin pump, CAD, HTN, multiple falls w/gait disorder;  who was admitted to [facility] on [facility]/22 after a mechanical fall with onset of left hip pain and inability to walk. She was found to have left non-displaced subcapital hip fracture and underwent percutaneous fixation of femoral neck by Dr. Earnestine Leys on 03/29/22. Post op to be TTWB and on Lovenox for DVT prophylaxis. She did have drop in K to 3.4 today which was supplemented and ABLA being monitored. Post op with pain, reports of confusion, difficulty with WB restrictions as well as weakness and fatigue. She reports he pain is under control. She reports she had a BM today.  She takes midodrine for chronic hypotension. CIR recommended due to functional decline.     Pt presents with decreased activity tolerance, decreased functional mobility, decreased balance, decreased awareness, decreased memory, difficulty maintaining precautions & feelings of stress Limiting pt's independence with leisure/community pursuits.  Met with pt today to discuss TR services including leisure education, activity analysis/modifications and stress management.  Also discussed the importance of social, emotional, spiritual health in addition to physical health and their effects on overall health and wellness.  Pt stated understanding.  Plan  Min 1 TR session >20 minutes per week during LOs  Recommendations for other services: None   Discharge Criteria: Patient will be discharged from TR if patient refuses treatment 3 consecutive times without medical reason.  If treatment goals not met, if there is a change in medical status, if patient makes no progress towards goals or if patient is discharged from hospital.  The above assessment, treatment plan, treatment alternatives and goals were discussed and mutually agreed upon: by patient  [place] 04/09/2022, 8:35 AM

## 2022-04-09 NOTE — Inpatient Diabetes Management (Addendum)
Inpatient Diabetes Program Recommendations  AACE/ADA: New Consensus Statement on Inpatient Glycemic Control (2015)  Target Ranges:  Prepandial:   less than 140 mg/dL      Peak postprandial:   less than 180 mg/dL (1-2 hours)      Critically ill patients:  140 - 180 mg/dL   Lab Results  Component Value Date   GLUCAP 322 (H) 04/09/2022   HGBA1C 7.2 (H) 04/01/2022    Review of Glycemic Control  Diabetes history: type 1 Outpatient Diabetes medications: Omnipod insulin pump with Dexcom CGM Current orders for Inpatient glycemic control: Insulin pump, Semglee 5 units daily  Inpatient Diabetes Program Recommendations:   Received diabetes coordinator consult about patient's insulin pump. Patient needs to be able to manage her own insulin pump while in the hospital. If unable to manage, she will need to be on basal and bolus insulin regimen in place of the pump.   Recommend starting Semglee 10 units daily, Novolog 0-6 units TID with meals while in the hospital.  Discontinue insulin pump. She will need to return to Dr. Honor Junes when discharged to be able to have her insulin pump checked and reassessed.    Insulin pump settings per Dr. Honor Junes on 03/16/2022: Pump Settings were as follows: Basal rates 12 AM = 0.3 9 AM = 0.5 12 PM = 0.45 6 PM = 0.4 9 PM = 0.2 TDD basal: 8.7 units  Bolus settings I/C: 13 ISF: 60 Target Glucose: 120 Active insulin time: 5 hours  Harvel Ricks RN BSN CDE Diabetes Coordinator Pager: [phone]  8am-5pm

## 2022-04-09 NOTE — Progress Notes (Signed)
Occupational Therapy Session Note  Patient Details  Name: Diana Frost MRN: [id number] Date of Birth: 1946-01-21  Today's Date: 04/09/2022 OT Individual Time: 1403-1500 OT Individual Time Calculation (min): 57 min   Short Term Goals: Week 1:  OT Short Term Goal 1 (Week 1): Pt will complete 1/3 toileting steps with CGA for standing balance using RW OT Short Term Goal 2 (Week 1): Pt will complete toilet transfer with CGA using RW OT Short Term Goal 3 (Week 1): Pt will use AE PRN during LB clothing with min cues needed for technique  Skilled Therapeutic Interventions/Progress Updates:     Spoke with PA, Pam, and received the "okay" to see the Pt following abnormal EKG. Pt received resting in wc finishing her lunch with TEDs and abdominal binder donned. Pt receptive to skilled OT session, politely denying need for BADLs. Pt reported unrated pain in L hip- provided intermittent rest breaks and repositioning, Pt requested to wait until after therapy to ask RN for pain medications.   Pt discussed orthostatic hypotension signs, symptoms, treatment with OT reporting she will communicate any symptoms during therapy session. Pt and OT discussed TTWB precautions with OT providing education on strategies and visualizations to assist Pt in maintaining precautions during session. Pt receptive to education.   Pt propelled self to therapy gym in wc for functional mobility and endurance training. Mod verbal cues required for technique and motivation during task and min A for navigating around obstacles +time.   Pt completed squat pivot transfer to R side to mat CGA while maintaining precautions. Seated EOM, completed BUE exercises using 4#weighted bar- 2x10 reps of bicep curls, chest press, and overhead press. Mod verbal cues required during task for technique and body mechanics. Pt educated on connecting breath to movement demonstrating teach back as evidence of learning. Pt completed squat pivot EOM>wc  CGA.   Pt completed squat pivot transfer to bed for functional transfer training with GCA and min verbal cues to maintain precautions. Pt able to set-up wc in safe position to set-up for transfer with min A. Pt CGA back to bed. Pt was left resting in bed with call bell in reach, bed alarm on, and all needs met.   Therapy Documentation Precautions:  Precautions Precautions: Fall Restrictions Weight Bearing Restrictions: Yes LLE Weight Bearing: Touchdown weight bearing General:   Vital Signs: Therapy Vitals Pulse Rate: 97 Resp: 16 BP: 119/65 Patient Position (if appropriate): Sitting Oxygen Therapy SpO2: 99 % O2 Device: Room Air Pain:   ADL: ADL Eating: Set up Where Assessed-Eating: Wheelchair Grooming: Setup Where Assessed-Grooming: Wheelchair Upper Body Bathing: Supervision/safety Where Assessed-Upper Body Bathing: Wheelchair (simulated) Lower Body Bathing: Maximal assistance (simulated) Where Assessed-Lower Body Bathing: Wheelchair Upper Body Dressing: Supervision/safety Where Assessed-Upper Body Dressing: Wheelchair Lower Body Dressing: Dependent (simulated) Where Assessed-Lower Body Dressing: Edge of bed Toileting: Dependent Where Assessed-Toileting: Bed level Toilet Transfer: Moderate assistance Toilet Transfer Method: Stand pivot Tub/Shower Transfer: Unable to assess Tub/Shower Transfer Method: Unable to assess Gaffer Transfer: Unable to assess [employer] Method: Unable to assess   Therapy/Group: Individual Therapy  [name] 04/09/2022, 2:38 PM

## 2022-04-09 NOTE — Progress Notes (Addendum)
Tillie Rung called back to update  treatment ; recheck CBG after 2 hours of giving novolog 5 units to cover recent cbg and give the basal insulin.

## 2022-04-09 NOTE — Progress Notes (Signed)
PROGRESS NOTE   Subjective/Complaints: CBGs up to 300s and nursing concerned patient is not dosing insulin pump appropriately, this has been discussed with her daughter.    ROS:  Pt denies SOB, abd pain, CP, N/V/C/D, and vision changes, +fatigue, +orthostasis, +shoulder pain  Objective:   VAS [name] LOWER EXTREMITY VENOUS (DVT)  Result Date: 04/08/2022  Lower Venous DVT Study Patient Name:  Diana Frost  Date of Exam:   04/07/2022 Medical Rec #: [id number]          Accession #:    [id number] Date of Birth: Dec 06, 1945         Patient Gender: F Patient Age:   77 years Exam Location:  [facility] Procedure:      VAS [name] LOWER EXTREMITY VENOUS (DVT) Referring Phys: Leeroy Cha --------------------------------------------------------------------------------  Indications: Pain and swelling left leg, s/p percutaneous fixation of femoral neck.  Comparison Study: No prior studies. Performing Technologist: Darlin Coco RDMS, RVT  Examination Guidelines: A complete evaluation includes B-mode imaging, spectral Doppler, color Doppler, and power Doppler as needed of all accessible portions of each vessel. Bilateral testing is considered an integral part of a complete examination. Limited examinations for reoccurring indications may be performed as noted. The reflux portion of the exam is performed with the patient in reverse Trendelenburg.  +-----+---------------+---------+-----------+----------+--------------+ RIGHTCompressibilityPhasicitySpontaneityPropertiesThrombus Aging +-----+---------------+---------+-----------+----------+--------------+ CFV  Full           Yes      Yes                                 +-----+---------------+---------+-----------+----------+--------------+   +---------+---------------+---------+-----------+----------+--------------+ LEFT     CompressibilityPhasicitySpontaneityPropertiesThrombus Aging  +---------+---------------+---------+-----------+----------+--------------+ CFV      Full           Yes      Yes                                 +---------+---------------+---------+-----------+----------+--------------+ SFJ      Full                                                        +---------+---------------+---------+-----------+----------+--------------+ FV Prox  Full                                                        +---------+---------------+---------+-----------+----------+--------------+ FV Mid   Full                                                        +---------+---------------+---------+-----------+----------+--------------+ FV DistalFull                                                        +---------+---------------+---------+-----------+----------+--------------+  PFV      Full                                                        +---------+---------------+---------+-----------+----------+--------------+ POP      Full           Yes      Yes                                 +---------+---------------+---------+-----------+----------+--------------+ PTV      Full                                                        +---------+---------------+---------+-----------+----------+--------------+ PERO     Full                                                        +---------+---------------+---------+-----------+----------+--------------+    Summary: RIGHT: - No evidence of common femoral vein obstruction.  LEFT: - There is no evidence of deep vein thrombosis in the lower extremity.  - No cystic structure found in the popliteal fossa.  *See table(s) above for measurements and observations. Electronically signed by Deitra Mayo MD on 04/08/2022 at 6:41:20 AM.    Final    Recent Labs    04/07/22 0531  WBC 6.3  HGB 9.6*  HCT 29.7*  PLT 343    Recent Labs    04/07/22 0531  NA 137  K 4.1  CL 100  CO2 28  GLUCOSE 212*   BUN 17  CREATININE 0.77  CALCIUM 8.7*     Intake/Output Summary (Last 24 hours) at 04/09/2022 0942 Last data filed at 04/09/2022 0824 Gross per 24 hour  Intake 240 ml  Output 1 ml  Net 239 ml        Physical Exam: Vital Signs Blood pressure 132/67, pulse (!) 110, temperature (!) 97.4 F (36.3 C), temperature source Oral, resp. rate 16, height '5\' 2"'$  (1.575 m), weight 53.8 kg, SpO2 94 %. 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 Functional deficits which require 3+ hours per day of interdisciplinary therapy in a comprehensive inpatient rehab setting. Physiatrist is providing  close team supervision and 24 hour management of active medical problems listed below. Physiatrist and rehab team continue to assess barriers to discharge/monitor patient progress toward functional and medical goals  Care Tool:  Bathing    Body parts bathed by patient: Right arm, Left arm, Chest, Abdomen, Front perineal area, Buttocks, Right upper leg, Left upper leg, Face   Body parts bathed by helper: Front perineal area, Buttocks, Right lower leg, Left lower leg      Bathing assist Assist Level: Minimal Assistance - Patient > 75%     Upper Body Dressing/Undressing Upper body dressing   What is the patient wearing?: Pull over shirt    Upper body assist Assist Level: Contact Guard/Touching assist    Lower Body Dressing/Undressing Lower body dressing      What is the patient wearing?: Incontinence brief, Pants     Lower body assist Assist for lower body dressing: Moderate Assistance - Patient 50 - 74%     Toileting Toileting    Toileting assist Assist for toileting: Moderate Assistance - Patient 50 - 74%     Transfers Chair/bed transfer  Transfers assist     Chair/bed transfer assist level: Contact Guard/Touching assist     Locomotion Ambulation   Ambulation assist   Ambulation activity did not occur: Safety/medical concerns (fatigue, poor adherance to TDWB precautions)  Assist level: Contact Guard/Touching assist Assistive device: Walker-rolling Max distance: 27 ft   Walk 10 feet activity   Assist  Walk 10 feet activity did not occur: Safety/medical concerns (fatigue, poor adherance to TDWB precautions)  Assist level: Contact Guard/Touching assist Assistive device: Walker-rolling   Walk 50 feet activity   Assist Walk 50 feet with 2 turns activity did not occur: Safety/medical concerns (fatigue, poor adherance to TDWB precautions)         Walk 150 feet activity   Assist Walk 150 feet activity did not occur: Safety/medical concerns (fatigue, poor adherance to TDWB precautions)         Walk 10 feet on uneven surface  activity   Assist Walk 10 feet on uneven surfaces activity did not occur: Safety/medical concerns (fatigue, poor adherance to TDWB precautions)         Wheelchair     Assist Is the patient using a wheelchair?: Yes Type of Wheelchair: Manual    Wheelchair assist level: Supervision/Verbal cueing Max wheelchair distance: 116f    Wheelchair 50 feet with 2 turns  activity    Assist        Assist Level: Supervision/Verbal cueing   Wheelchair 150 feet activity     Assist      Assist Level: Supervision/Verbal cueing   Blood pressure 132/67, pulse (!) 110, temperature (!) 97.4 F (36.3 C), temperature source Oral, resp. rate 16, height '5\' 2"'$  (1.575 m), weight 53.8 kg, SpO2 94 %.  Medical Problem List and Plan: 1. Functional deficits secondary to Left femoral neck fracture s/p percutaneous fixation on 03/29/22             -patient may shower, please cover incision             -ELOS/Goals: 12-14 days , PT/OT supervision to mod I             Con't CIR- PT and OT-   Vitamin D supplement started  -Interdisciplinary Team Conference today   2.  Impaired mobility: [place]ore[place]viewed and is negative for clots. Continue Lovenox             -  antiplatelet therapy: ASA '81mg'$  3. Pain from hip fracture: decrease Norco to '5mg'$  due to hypotension  4. Mood/Behavior/Sleep: LCSW to follow for evaluation and support.              -antipsychotic agents: N/A 5. Neuropsych/cognition: This patient is capable of making decisions on her own behalf. 6. Skin/Wound Care: Routine pressure relief measures.  7. Fluids/Electrolytes/Nutrition: Monitor I/O. Check CMET in am.  8. Left subcapital femur Fx s/p fixation: TDWB per Dr. Earnestine Leys 9.T1DM with prior episodes of hypoglycemia: continue omnipod and dexcom --will consult diabetic coordinator to assist with management.  Started on Semglee 5U daily.              --Followed by Dr Mee Hives. 10. Orthostatic hypotension: abdominal binder ordered. Hydrocodone dose lowered --Also reports of diplopia per optho notes.  - Continue Midodrine for BP support. Compression garments ordered for during therapy.  11. CAD/Blood pressure: Denies chest pain 12. Depression/anxiety?: Continue Zoloft and Bupropion.  13. Acute blood loss anemia: Stable around 9.0. Recheck CBC in am.              -Continue iron supplementation 14.  Hypokalemia: Was supplemented X 1 today. K+ 3.4 12/28. Recheck in am.   12/30- K+ 3.5- will recheck Monday 15. Constipation: changed senna to HS  12/30- LBM 2 days ago- is normal for her to go q2-3 days.  16. Fatigue: checked B12 level and is normal 17. Suboptimal magnesium: supplemented 2grams IV on 12/28, 1gm ordered 1/3. 18. Radiation induced nausea: discussed hyperbaric oxygen therapy outpatient. Scheduled compazine prior to breakfast. Resolved.  19. Suboptimal vitamin D level: ergocalciferol 50,000U ordered q7 days 20. Tachycardia: EKG ordered   LOS: 7 days A FACE TO FACE EVALUATION WAS PERFORMED  Clide Deutscher Leor Whyte 04/09/2022, 9:42 AM

## 2022-04-09 NOTE — Plan of Care (Signed)
  Problem: RH KNOWLEDGE DEFICIT GENERAL Goal: RH STG INCREASE KNOWLEDGE OF SELF CARE AFTER HOSPITALIZATION Description: Patient and daughter will be able to manage care at discharge using educational handouts independently Outcome: Progressing; patient aware of her inability to manage her own insulin pump and willing to to SSI per order.

## 2022-04-09 NOTE — Progress Notes (Signed)
Occupational Therapy Session Note  Patient Details  Name: Diana Frost MRN: [id number] Date of Birth: 1946/02/25  Today's Date: 04/09/2022 OT Individual Time: 0803-0900 OT Individual Time Calculation (min): 57 min    Short Term Goals: Week 1:  OT Short Term Goal 1 (Week 1): Pt will complete 1/3 toileting steps with CGA for standing balance using RW OT Short Term Goal 2 (Week 1): Pt will complete toilet transfer with CGA using RW OT Short Term Goal 3 (Week 1): Pt will use AE PRN during LB clothing with min cues needed for technique  Skilled Therapeutic Interventions/Progress Updates:  Skilled OT intervention completed with focus on ADL retraining, functional transfers, activity tolerance. Pt received upright in bed, agreeable to session. Unrated pain in L hip intermittently reported; nursing had just given pain meds, however pt vomited the contents up upon OT arrival. Nursing notified of status. Therapist offered rest breaks and repositioning for pain reduction throughout.  Transitioned to EOB with CGA for sitting balance. Increased time needed this session for lethargy/fatigue and frequent R/posterior lean in sitting, with cues needed for anterior weight shift for BLE support on floor.  Able to bathe UB with supervision/CGA for correcting sitting balance. Donned shirt with supervision and abdominal binder with max A. Donned knee high TEDS with total A for time, then pt able to thread BLE into pants with supervision this session. Donned shoe on R foot in prep for standing for WB adherence. Pre-threaded new brief for energy conservation, then min A sit > stand using RW for removal of current wet brief with mod A. Cues needed for anterior weight shifting and using UE on RW to obtain balance prior to engaging in ADL task as pt required min A for balance and mod cues for avoiding standing without UE and therefore placing > TDWB on LLE. Required seated rest break, then with review of proper  standing method pt able to stand with CGA and maintain stance with CGA/supervision during mod A donning of brief/pants over hips.  Stand pivot with CGA using RW, and mod cues for BLE placement/WB adherence > recliner. Pt seated in recliner with chair pad alarm on/activated, breakfast re-heated per request, and with all needs in reach at end of session.   Therapy Documentation Precautions:  Precautions Precautions: Fall Restrictions Weight Bearing Restrictions: Yes LLE Weight Bearing: Touchdown weight bearing    Therapy/Group: Individual Therapy  Blase Mess, MS, OTR/L  04/09/2022, 12:40 PM

## 2022-04-09 NOTE — Progress Notes (Signed)
Insulin pump order to discontinue. Patient notified . Per patient it is ok to stay on her abdomen it doesn't bother her but will not use it.

## 2022-04-10 DIAGNOSIS — I959 Hypotension, unspecified: Secondary | ICD-10-CM | POA: Diagnosis not present

## 2022-04-10 LAB — BASIC METABOLIC PANEL
Anion gap: 13 (ref 5–15)
BUN: 21 mg/dL (ref 8–23)
CO2: 27 mmol/L (ref 22–32)
Calcium: 9.3 mg/dL (ref 8.9–10.3)
Chloride: 97 mmol/L — ABNORMAL LOW (ref 98–111)
Creatinine, Ser: 1.03 mg/dL — ABNORMAL HIGH (ref 0.44–1.00)
GFR, Estimated: 56 mL/min — ABNORMAL LOW (ref >60)
Glucose, Bld: 373 mg/dL — ABNORMAL HIGH (ref 70–99)
Potassium: 4.3 mmol/L (ref 3.5–5.1)
Sodium: 137 mmol/L (ref 135–145)

## 2022-04-10 LAB — CBC WITH DIFFERENTIAL/PLATELET
Abs Immature Granulocytes: 0.02 10*3/uL (ref 0.00–0.07)
Basophils Absolute: 0 10*3/uL (ref 0.0–0.1)
Basophils Relative: 1 %
Eosinophils Absolute: 0.1 10*3/uL (ref 0.0–0.5)
Eosinophils Relative: 2 %
HCT: 32 % — ABNORMAL LOW (ref 36.0–46.0)
Hemoglobin: 10.5 g/dL — ABNORMAL LOW (ref 12.0–15.0)
Immature Granulocytes: 0 %
Lymphocytes Relative: 12 %
Lymphs Abs: 0.8 10*3/uL (ref 0.7–4.0)
MCH: 32.1 pg (ref 26.0–34.0)
MCHC: 32.8 g/dL (ref 30.0–36.0)
MCV: 97.9 fL (ref 80.0–100.0)
Monocytes Absolute: 0.4 10*3/uL (ref 0.1–1.0)
Monocytes Relative: 6 %
Neutro Abs: 5 10*3/uL (ref 1.7–7.7)
Neutrophils Relative %: 79 %
Platelets: 421 10*3/uL — ABNORMAL HIGH (ref 150–400)
RBC: 3.27 MIL/uL — ABNORMAL LOW (ref 3.87–5.11)
RDW: 14.5 % (ref 11.5–15.5)
WBC: 6.4 10*3/uL (ref 4.0–10.5)
nRBC: 0 % (ref 0.0–0.2)

## 2022-04-10 LAB — MAGNESIUM: Magnesium: 2 mg/dL (ref 1.7–2.4)

## 2022-04-10 LAB — GLUCOSE, CAPILLARY
Glucose-Capillary: 209 mg/dL — ABNORMAL HIGH (ref 70–99)
Glucose-Capillary: 232 mg/dL — ABNORMAL HIGH (ref 70–99)
Glucose-Capillary: 337 mg/dL — ABNORMAL HIGH (ref 70–99)
Glucose-Capillary: 307 mg/dL — ABNORMAL HIGH (ref 70–99)
Glucose-Capillary: 47 mg/dL — ABNORMAL LOW (ref 70–99)
Glucose-Capillary: 48 mg/dL — ABNORMAL LOW (ref 70–99)
Glucose-Capillary: 53 mg/dL — ABNORMAL LOW (ref 70–99)
Glucose-Capillary: 149 mg/dL — ABNORMAL HIGH (ref 70–99)
Glucose-Capillary: 232 mg/dL — ABNORMAL HIGH (ref 70–99)
Glucose-Capillary: 180 mg/dL — ABNORMAL HIGH (ref 70–99)

## 2022-04-10 MED ORDER — INSULIN GLARGINE-YFGN 100 UNIT/ML ~~LOC~~ SOLN
12.0000 [IU] | Freq: Every day | SUBCUTANEOUS | Status: DC
  Administered 2022-04-11: 12 [IU] via SUBCUTANEOUS
  Filled 2022-04-10: qty 0.12

## 2022-04-10 MED ORDER — INSULIN GLARGINE-YFGN 100 UNIT/ML ~~LOC~~ SOLN: 11.0000 [IU] | Freq: Every day | SUBCUTANEOUS | Status: DC

## 2022-04-10 MED ORDER — INSULIN GLARGINE-YFGN 100 UNIT/ML ~~LOC~~ SOLN
2.0000 [IU] | Freq: Once | SUBCUTANEOUS | Status: AC
  Administered 2022-04-10: 2 [IU] via SUBCUTANEOUS
  Filled 2022-04-10: qty 0.02

## 2022-04-10 MED ORDER — SENNOSIDES-DOCUSATE SODIUM 8.6-50 MG PO TABS
2.0000 | ORAL_TABLET | Freq: Every day | ORAL | Status: DC
  Administered 2022-04-10 – 2022-04-19 (×10): 2 via ORAL
  Filled 2022-04-10 (×10): qty 2

## 2022-04-10 MED ORDER — MELATONIN 3 MG PO TABS
3.0000 mg | ORAL_TABLET | Freq: Every evening | ORAL | Status: DC | PRN
  Administered 2022-04-10: 3 mg via ORAL
  Filled 2022-04-10 (×2): qty 1

## 2022-04-10 MED ORDER — INSULIN ASPART 100 UNIT/ML IJ SOLN: 2.0000 [IU] | Freq: Three times a day (TID) | INTRAMUSCULAR | Status: DC

## 2022-04-10 MED ORDER — ACETAMINOPHEN 325 MG PO TABS
650.0000 mg | ORAL_TABLET | Freq: Three times a day (TID) | ORAL | Status: DC
  Administered 2022-04-10 – 2022-04-12 (×8): 650 mg via ORAL
  Filled 2022-04-10 (×8): qty 2

## 2022-04-10 MED ORDER — INSULIN ASPART 100 UNIT/ML IJ SOLN
2.0000 [IU] | Freq: Three times a day (TID) | INTRAMUSCULAR | Status: DC
  Administered 2022-04-10 – 2022-04-11 (×3): 2 [IU] via SUBCUTANEOUS

## 2022-04-10 NOTE — Progress Notes (Signed)
Occupational Therapy Session Note  Patient Details  Name: Diana Frost MRN: [id number] Date of Birth: [birth year]  Today's Date: 04/10/2022 OT Individual Time: 0803-0900 & 1420-1445 OT Individual Time Calculation (min): 57 min & 25 min   Short Term Goals: Week 1:  OT Short Term Goal 1 (Week 1): Pt will complete 1/3 toileting steps with CGA for standing balance using RW OT Short Term Goal 2 (Week 1): Pt will complete toilet transfer with CGA using RW OT Short Term Goal 3 (Week 1): Pt will use AE PRN during LB clothing with min cues needed for technique  Skilled Therapeutic Interventions/Progress Updates:  Session 1 Skilled OT intervention completed with focus on activity tolerance, ADL retraining and functional transfers. Pt received upright in bed, lethargic with pt not opening eyes even when talking to therapist. Dexcom alert on pt's phone also noted to be going off that pt's CBG was at 364, with pt not attending to the monitor and when OT brought attention to it, pt remained with eyes closed and said "oh okay." Nursing and MD made aware of pt status and nurse in room to check CBG with 337 reading and insulin/other meds administered.  Pt stated "I want to sleep through therapy." OT re-educated on pt's goals to get home and pt expressing lack of motivation. Neuropsych recommended and team made aware for coping strategies/depressive thoughts.  Pt requested assist to use bathroom for void. Donned knee high TEDS with total A for time, transitioned to EOB with HOB elevated to 90 degrees however pt requiring min A to sit fully up 2/2 R lean. Donned R grip clipper per request in prep for transfer. Sit > stand with min A using RW with pt pushing posteriorly and LLE WB > TDWB. Pt indicated she didn't feel safe to transfer and mid stand verbalized urgent void and inability to make it to commode.   Pt sat back down, with incontinent void noted. Switched out grip slipper for shoe for greater WB  adherence, then sit > stand using RW with cues for anterior weight shifting and BUE hand placement on RW prior to completing dynamic task. Max A needed for doffing soiled brief, but pt able to complete pericare with CGA for standing balance. Threaded new brief with max A, then seated rest break with mod A threading of pants for time, and CGA sit > stand and min A for donning over hips in stance.   Donned shirt with supervision, and abdominal binder with total A. Min A stand pivot with mod cues needed throughout for WB and RLE shifting vs stepping. Completed oral care and hair grooming at sink with set up A.  Pt remained seated in w/c, with chair pad alarm on/activated, and with all needs in reach at end of session.  Session 2 Skilled OT intervention completed with focus on functional transfers, TDWB education and reinforcement, bed mobility. Pt received seated in w/c, agreeable to session. No pain reported however requested to lay down 2/2 sitting up since early OT session and report of wet pants from incontinent episode.   Completed min A sit > stand using RW with min cues for pushing on arm rest to power up and anterior weight shifting for balance, then stand pivot with min A however poor R foot shifting and pt placing greater than TDWB on LLE. Doffed pants in stance with mod A.  Reviewed with pt and demonstrated what therapist means with "shifting" of R foot and either toes or heel down  for TDWB adherence however pt reports that doing the above method is challenging for her. She feels like she knows what to do but can't do it.  Min A bed mobility to supine. Used bridge on RLE for boosting towards HOB with min A. Pt remained semi-supine, with bed alarm on/activated, and with all needs in reach at end of session.   Therapy Documentation Precautions:  Precautions Precautions: Fall Restrictions Weight Bearing Restrictions: Yes LLE Weight Bearing: Touchdown weight bearing   Therapy/Group:  Individual Therapy  Blase Mess, MS, OTR/L  04/10/2022, 3:34 PM

## 2022-04-10 NOTE — Inpatient Diabetes Management (Signed)
Inpatient Diabetes Program Recommendations  AACE/ADA: New Consensus Statement on Inpatient Glycemic Control (2015)  Target Ranges:  Prepandial:   less than 140 mg/dL      Peak postprandial:   less than 180 mg/dL (1-2 hours)      Critically ill patients:  140 - 180 mg/dL   Lab Results  Component Value Date   GLUCAP 307 (H) 04/10/2022   HGBA1C 7.2 (H) 04/01/2022    Review of Glycemic Control  Diabetes history: type 1 Outpatient Diabetes medications: Omnipod insulin pump, Dexcom CGM Current orders for Inpatient glycemic control: Semglee 11 units daily, Novolog 0-6 units TID, Novolog 0-5 units HS scale  Inpatient Diabetes Program Recommendations:   Spoke with patient at the bedside. Daughter had already left to go home. Patient was diagnosed with diabetes at age 77 years. She started on an insulin pump in 1996. Most recently has an Omnipod insulin pump and uses a Dexcom sensor CGM. Patient agrees with using insulin injections while in the hospital. States that she is not sure that her insulin pump is working properly.  Patient needs to remove the Omnipod insulin pump from the skin while in the hospital if taking insulin by injection.  Recommend increasing Semglee to 12 units daily, continue Novolog 0-6 units TID, and add Novolog 2 units TID with meals if patient eats at least 50% of meal.   Will continue to monitor blood sugars while in the hospital.  Harvel Ricks RN BSN CDE Diabetes Coordinator Pager: [phone]  8am-5pm

## 2022-04-10 NOTE — Progress Notes (Addendum)
Hypoglycemic Event  CBG: 48  Treatment: dinner tray; protein shake  Symptoms: none  Follow-up CBG: Time:1750 CBG Result:149  Possible Reasons for Event: Unknown  Comments/MD notified: [employer] Check cbg after 30 mins; may give meal coverage if patient ate dinner and cbg higher than last cbg taken 149.

## 2022-04-10 NOTE — Progress Notes (Addendum)
PROGRESS NOTE   Subjective/Complaints: Patient lethargic this morning as per therapy. CBGs were up to 300s, increased insulin dose She is feeling much better now   ROS:  Pt denies SOB, abd pain, CP, N/V/C/D, and vision changes, +fatigue, +orthostasis, +shoulder pain, +lethargy this morning as per therapy  Objective:   No results found. No results for input(s): "WBC", "HGB", "HCT", "PLT" in the last 72 hours.   No results for input(s): "NA", "K", "CL", "CO2", "GLUCOSE", "BUN", "CREATININE", "CALCIUM" in the last 72 hours.    Intake/Output Summary (Last 24 hours) at 04/10/2022 1132 Last data filed at 04/10/2022 0700 Gross per 24 hour  Intake 470 ml  Output --  Net 470 ml        Physical Exam: Vital Signs Blood pressure 109/65, pulse 94, temperature 98 F (36.7 C), resp. rate 17, height '5\' 2"'$  (1.575 m), weight 53.8 kg, SpO2 97 %. 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 Functional deficits which require 3+ hours per day of interdisciplinary therapy in a  comprehensive inpatient rehab setting. Physiatrist is providing close team supervision and 24 hour management of active medical problems listed below. Physiatrist and rehab team continue to assess barriers to discharge/monitor patient progress toward functional and medical goals  Care Tool:  Bathing    Body parts bathed by patient: Right arm, Left arm, Chest, Abdomen, Front perineal area, Buttocks, Right upper leg, Left upper leg, Face   Body parts bathed by helper: Front perineal area, Buttocks, Right lower leg, Left lower leg     Bathing assist Assist Level: Minimal Assistance - Patient > 75%     Upper Body Dressing/Undressing Upper body dressing   What is the patient wearing?: Pull over shirt    Upper body assist Assist Level: Contact Guard/Touching assist    Lower Body Dressing/Undressing Lower body dressing      What is the patient wearing?: Incontinence brief, Pants     Lower body assist Assist for lower body dressing: Moderate Assistance - Patient 50 - 74%     Toileting Toileting    Toileting assist Assist for toileting: Maximal Assistance - Patient 25 - 49%     Transfers Chair/bed transfer  Transfers assist     Chair/bed transfer assist level: Contact Guard/Touching assist     Locomotion Ambulation   Ambulation assist   Ambulation activity did not  occur: Safety/medical concerns (fatigue, poor adherance to TDWB precautions)  Assist level: Contact Guard/Touching assist Assistive device: Walker-rolling Max distance: 27 ft   Walk 10 feet activity   Assist  Walk 10 feet activity did not occur: Safety/medical concerns (fatigue, poor adherance to TDWB precautions)  Assist level: Contact Guard/Touching assist Assistive device: Walker-rolling   Walk 50 feet activity   Assist Walk 50 feet with 2 turns activity did not occur: Safety/medical concerns (fatigue, poor adherance to TDWB precautions)         Walk 150 feet activity   Assist Walk  150 feet activity did not occur: Safety/medical concerns (fatigue, poor adherance to TDWB precautions)         Walk 10 feet on uneven surface  activity   Assist Walk 10 feet on uneven surfaces activity did not occur: Safety/medical concerns (fatigue, poor adherance to TDWB precautions)         Wheelchair     Assist Is the patient using a wheelchair?: Yes Type of Wheelchair: Manual    Wheelchair assist level: Supervision/Verbal cueing Max wheelchair distance: 181f    Wheelchair 50 feet with 2 turns activity    Assist        Assist Level: Supervision/Verbal cueing   Wheelchair 150 feet activity     Assist      Assist Level: Supervision/Verbal cueing   Blood pressure 109/65, pulse 94, temperature 98 F (36.7 C), resp. rate 17, height '5\' 2"'$  (1.575 m), weight 53.8 kg, SpO2 97 %.  Medical Problem List and Plan: 1. Functional deficits secondary to Left femoral neck fracture s/p percutaneous fixation on 03/29/22             -patient may shower, please cover incision             -ELOS/Goals: 12-14 days , PT/OT supervision to mod I             Con't CIR- PT and OT-   Vitamin D supplement started  -Interdisciplinary Team Conference today   2.  Impaired mobility: UKoreareviewed and is negative for clots. Continue Lovenox             -antiplatelet therapy: ASA '81mg'$  3. Pain from hip fracture: decrease Norco to '5mg'$  due to hypotension  4. Daytime somnolence: decrease melatonin to '3mg'$  HS 5. Neuropsych/cognition: This patient is capable of making decisions on her own behalf. 6. Skin/Wound Care: Routine pressure relief measures.  7. Fluids/Electrolytes/Nutrition: Monitor I/O. Check CMET in am.  8. Left subcapital femur Fx s/p fixation: TDWB per Dr. HEarnestine Leys9.T1DM with prior episodes of hypoglycemia: continue omnipod and dexcom --will consult diabetic coordinator to assist with management.  Increase semglee to 11U daily.              --Followed by Dr TMee Hives 10. Orthostatic hypotension: abdominal binder ordered. Hydrocodone dose lowered --Also reports of diplopia per optho notes.  - Continue Midodrine for BP support. Compression garments ordered for during therapy.  11. CAD/Blood pressure: Denies chest pain 12. Depression/anxiety?: Continue Zoloft and Bupropion.  13. Acute blood loss anemia: Stable around 9.0. Recheck CBC in am.              -Continue iron supplementation 14. Hypokalemia: Was supplemented X 1 today. K+ 3.4 12/28. Recheck in am.   12/30- K+ 3.5- will recheck Monday 15. Constipation: changed senna to HS  12/30- LBM 2 days ago- is normal for her to go q2-3 days.  16. Fatigue: checked B12  level and is normal 17. Suboptimal magnesium: supplemented 2grams IV on 12/28, 1gm ordered 1/3. 18. Radiation induced nausea: discussed hyperbaric oxygen therapy outpatient. Scheduled compazine prior to breakfast. Resolved.  19. Suboptimal vitamin D level: ergocalciferol 50,000U ordered q7 days 20. Tachycardia: EKG ordered and shows sinus tachycardia, discussed with patient.  21. Lethargy: CBC and BMP ordered but appears to have resilved.    LOS: 8 days A FACE TO FACE EVALUATION WAS PERFORMED  Diana Frost 04/10/2022, 11:32 AM

## 2022-04-10 NOTE — Progress Notes (Signed)
Physical Therapy Weekly Progress Note  Patient Details  Name: Diana Frost MRN: [id number] Date of Birth: 03-15-1946  Beginning of progress report period: April 03, 2022 End of progress report period: April 10, 2022  Today's Date: 04/10/2022 PT Individual Time: 1001-1056 PT Individual Time Calculation (min): 55 min   Patient has met 3 of 3 short term goals. Pt demonstrates gradual progress towards long term goals. Pt is currently able to transfer semi-reclined<>sitting EOB with supervision using bed features and transition sit<>supine with CGA. Pt currently requires CGA/min A for sit<>stands with RW and CGA/light min A for stand<>pivots with RW - intermittent cues for adherence to LLE TDWB precautions. Pt has progressed with ambulation up to 78f with RW and CGA/light min A with cues for technique to maintain TDWB precautions and is able to perform WC mobility up to 1543fusing BUE and supervision. Pt continues to be limited by pain, low BP, generalized weakness/deconditioning, decreased balance strategies, and difficulty maintaining LLE TDWB precautions. Pt will benefit from family education with daughter prior to discharge home.   Patient continues to demonstrate the following deficits muscle weakness, decreased cardiorespiratoy endurance, decreased memory, and decreased standing balance, decreased postural control, decreased balance strategies, and difficulty maintaining precautions and therefore will continue to benefit from skilled PT intervention to increase functional independence with mobility.  Patient progressing toward long term goals..  Continue plan of care.  PT Short Term Goals Week 1:  PT Short Term Goal 1 (Week 1): pt will perform bed mobility with CGA overall PT Short Term Goal 1 - Progress (Week 1): Met PT Short Term Goal 2 (Week 1): pt will transfer sit<>stand with LRAD and CGA PT Short Term Goal 2 - Progress (Week 1): Met PT Short Term Goal 3 (Week 1): pt will  initate gait training with RW PT Short Term Goal 3 - Progress (Week 1): Met Week 2:  PT Short Term Goal 1 (Week 2): pt will transfer bed<>chair with LRAD and supervision PT Short Term Goal 2 (Week 2): pt will transfer sit<>stand with LRAD and supervision PT Short Term Goal 3 (Week 2): pt will ambulate 1021fith LRAD and supervision while adhering to TDWB precautions  Skilled Therapeutic Interventions/Progress Updates:  Ambulation/gait training;Community reintegration;DME/adaptive equipment instruction;Neuromuscular re-education;Psychosocial support;Stair training;UE/LE Strength taining/ROM;Wheelchair propulsion/positioning;Balance/vestibular training;Discharge planning;Pain management;Skin care/wound management;Therapeutic Activities;UE/LE Coordination activities;Cognitive remediation/compensation;Disease management/prevention;Functional mobility training;Patient/family education;Therapeutic Exercise;Visual/perceptual remediation/compensation   Today's Interventions: [facility] at [facility]. Pt agreeable to PT treatment and denied any pain at rest, increasing to 2/10 in L hip with mobility. Pt's daughter with questions regarding pt's current LOF and questions for RN - RN present at bedside. Session with emphasis on functional mobility/transfers, generalized strengthening and endurance, and gait training. [facility]. Pt transferred on/off Nustep with RW via stand<>pivot with CGA - cues to keep RW underneath shoulders for leverage to pivot on RLE. Pt performed BUE and RLE strengthening on Nustep at workload 3 for 10 minutes for a total of 262 steps with emphasis on cardiovascular endurance with LLE resting on cushion on ground - steps per minute >25.   In ortho gym, pt stood with RW and CGA/light min A - cues to push up from [facility] and pt ambulated [id number] and 5ft 79f with RW and CGA with close WC  follow. Pt initially started with adhering to LLE TDWB precautions, however with increased fatigue, appeared to be PWB on  LLE rather then TDWB - therefore stopped and sat in [facility] to rest. Attempted to locate weight bearing shoe that beeped if too much weight was placed through LE, however unable to locate. Returned to room and concluded session with pt sitting in [facility] with daughter present at bedside, awaiting SLP session.   Therapy Documentation Precautions:  Precautions Precautions: Fall Restrictions Weight Bearing Restrictions: Yes LLE Weight Bearing: Touchdown weight bearing  Therapy/Group: Individual Therapy Alfonse Alpers PT, DPT  04/10/2022, 7:10 AM

## 2022-04-10 NOTE — Progress Notes (Signed)
Physical Therapy Session Note  Patient Details  Name: Diana Frost MRN: [id number] Date of Birth: [birth year]  Today's Date: 04/10/2022 PT Individual Time:  -      Short Term Goals: Week 1:  PT Short Term Goal 1 (Week 1): pt will perform bed mobility with CGA overall PT Short Term Goal 2 (Week 1): pt will transfer sit<>stand with LRAD and CGA PT Short Term Goal 3 (Week 1): pt will initate gait training with RW   Skilled Therapeutic Interventions/Progress Updates:   Pt received sitting in recliner.   Daughter reporting that pt's blood glucose in >400 and waiting to hear from RN staff on administration of insulin. RN found and questioned about insulin, reporting that she is waiting for delivery from pharmacy and clarification from diabetic coordinator. RN also reporting that pt had abnormal EKG and has not heard from PA or MD for continued direction. PT will hold therapy at this time, until medical team abel to review EKG and clear pt for continued therapy.   Therapy Documentation Precautions:  Precautions Precautions: Fall Restrictions Weight Bearing Restrictions: Yes LLE Weight Bearing: Touchdown weight bearing Vital Signs: Therapy Vitals Temp: 98 F (36.7 C) Pulse Rate: 94 Resp: 17 BP: 109/65 Patient Position (if appropriate): Lying Oxygen Therapy SpO2: 97 % O2 Device: Room Air     Therapy/Group: Individual Therapy  Lorie Phenix 04/10/2022, 6:03 AM

## 2022-04-10 NOTE — Progress Notes (Signed)
Hypoglycemic Event  CBG: 47  Treatment: 8zo orange juice  Symptoms: none  Follow-up CBG: Time:1414 CBG Result:48  Possible Reasons for Event: unknown  Comments/MD notified: [employer]

## 2022-04-10 NOTE — Progress Notes (Signed)
Speech Language Pathology Daily Session Note  Patient Details  Name: Diana Frost MRN: [id number] Date of Birth: [birth year]  Today's Date: 04/10/2022 SLP Individual Time: 1100-1200 SLP Individual Time Calculation (min): 60 min  Short Term Goals: Week 1: SLP Short Term Goal 1 (Week 1): Patient will demonstrate functional problem solving for mildly complex tasks with supervision verbal cues. SLP Short Term Goal 2 (Week 1): Patient will recall new, daily information with Min cues for use of memory compensatory strategies.  Skilled Therapeutic Interventions: Pt seen this date for skilled ST intervention targeting cognitive goals outlined above. Pt received awake/alert and OOB in w/c drinking coffee. Daughter present for the majority of today's session. Agreeable to intervention in hospital room. Pt pleasant, motivated, and participatory throughout.  Today's session with emphasis on completion of semi-complex + functional IADL tasks r/t smart phone use and ADL sequencing (verbal). Overall, pt benefited from Mod A verbal and visual cues, faded to Min A verbal cues for navigating smart phone features, correcting error in daughter's phone numbers, and sending voice messages to family members given decreased accuracy with texting. Completed verbal sequencing task of familiar ADL tasks (4-step) with Min-Mod A verbal and visual cues faded to Sup A; Mod A for 5-step sequencing. Sup A for functional recall throughout today's session.  Pt left in room and OOB in w/c with all safety measures activated and call bell within reach. Continue per current ST POC. Specifically, r/t reinforcement of information provided during today's session.  Pain No pain reported; NAD  Therapy/Group: Individual Therapy  Azalia Neuberger A Ly Wass 04/10/2022, 3:11 PM

## 2022-04-10 NOTE — Progress Notes (Signed)
Hypoglycemic Event  CBG: 53  Treatment: 8oz Orange juice  Symptoms: None  Follow-up CBG: Time:1652 CBG Result:47  Possible Reasons for Event:  Comments/MD notified:Pam PA notified/ [address]

## 2022-04-11 DIAGNOSIS — I959 Hypotension, unspecified: Secondary | ICD-10-CM | POA: Diagnosis not present

## 2022-04-11 LAB — GLUCOSE, CAPILLARY
Glucose-Capillary: 298 mg/dL — ABNORMAL HIGH (ref 70–99)
Glucose-Capillary: 376 mg/dL — ABNORMAL HIGH (ref 70–99)
Glucose-Capillary: 312 mg/dL — ABNORMAL HIGH (ref 70–99)
Glucose-Capillary: 183 mg/dL — ABNORMAL HIGH (ref 70–99)
Glucose-Capillary: 142 mg/dL — ABNORMAL HIGH (ref 70–99)

## 2022-04-11 MED ORDER — INSULIN GLARGINE-YFGN 100 UNIT/ML ~~LOC~~ SOLN
14.0000 [IU] | Freq: Every day | SUBCUTANEOUS | Status: DC
  Administered 2022-04-12: 14 [IU] via SUBCUTANEOUS
  Filled 2022-04-11 (×2): qty 0.14

## 2022-04-11 MED ORDER — INSULIN GLARGINE-YFGN 100 UNIT/ML ~~LOC~~ SOLN
2.0000 [IU] | Freq: Once | SUBCUTANEOUS | Status: AC
  Administered 2022-04-11: 2 [IU] via SUBCUTANEOUS
  Filled 2022-04-11: qty 0.02

## 2022-04-11 MED ORDER — HYDROCODONE-ACETAMINOPHEN 5-325 MG PO TABS
1.0000 | ORAL_TABLET | Freq: Four times a day (QID) | ORAL | Status: DC | PRN
  Administered 2022-04-12: 1 via ORAL
  Filled 2022-04-11: qty 1

## 2022-04-11 MED ORDER — INSULIN ASPART 100 UNIT/ML IJ SOLN
3.0000 [IU] | Freq: Three times a day (TID) | INTRAMUSCULAR | Status: DC
  Administered 2022-04-11 – 2022-04-12 (×4): 3 [IU] via SUBCUTANEOUS

## 2022-04-11 NOTE — Inpatient Diabetes Management (Signed)
Inpatient Diabetes Program Recommendations  AACE/ADA: New Consensus Statement on Inpatient Glycemic Control   Target Ranges:  Prepandial:   less than 140 mg/dL      Peak postprandial:   less than 180 mg/dL (1-2 hours)      Critically ill patients:  140 - 180 mg/dL    Latest Reference Range & Units 04/11/22 03:31 04/11/22 06:11  Glucose-Capillary 70 - 99 mg/dL 298 (H) 376 (H)    Latest Reference Range & Units 04/10/22 08:14 04/10/22 12:13 04/10/22 16:36 04/10/22 16:52 04/10/22 17:14 04/10/22 17:59 04/10/22 18:47 04/10/22 20:55  Glucose-Capillary 70 - 99 mg/dL 337 (H) 307 (H) 53 (L) 47 (L) 48 (L) 149 (H) 232 (H) 180 (H)   Review of Glycemic Control  Diabetes history: DM27 (dx at 77 years old; requires basal, correction, and carb coverage insulin) Outpatient Diabetes medications: OmniPod insulin pump with Dexcom G6 Current orders for Inpatient glycemic control: Semglee 12 units daily, Novolog 2 units TID with meals, Novolog 0-6 units TID with meals, Novolog 0-5 units QHS  Inpatient Diabetes Program Recommendations:    Insulin: Please consider increasing Semglee to 14 units daily (add Semglee 2 units x1 now) and meal coverage to Novolog 3 units TID with meals.  Also, please consider ordering CBG at 2am to help determine glucose trends during the night.  Note: Noted consult for Diabetes Coordinator. Diabetes Coordinator is not on campus over the weekend but available by pager from 8am to 5pm for questions or concerns.   Thanks, Barnie Alderman, RN, MSN, [place] Diabetes Coordinator Inpatient Diabetes Program [phone] (Team Pager from 8am to [place])

## 2022-04-11 NOTE — Progress Notes (Signed)
Physical Therapy Session Note  Patient Details  Name: Diana Frost MRN: [id number] Date of Birth: 06/26/1945  Today's Date: 04/11/2022 PT Individual Time: 1300-1356 PT Individual Time Calculation (min): 56 min   Short Term Goals: Week 1:  PT Short Term Goal 1 (Week 1): pt will perform bed mobility with CGA overall PT Short Term Goal 1 - Progress (Week 1): Met PT Short Term Goal 2 (Week 1): pt will transfer sit<>stand with LRAD and CGA PT Short Term Goal 2 - Progress (Week 1): Met PT Short Term Goal 3 (Week 1): pt will initate gait training with RW PT Short Term Goal 3 - Progress (Week 1): Met Week 2:  PT Short Term Goal 1 (Week 2): pt will transfer bed<>chair with LRAD and supervision PT Short Term Goal 2 (Week 2): pt will transfer sit<>stand with LRAD and supervision PT Short Term Goal 3 (Week 2): pt will ambulate 5f with LRAD and supervision while adhering to TDWB precautions  Skilled Therapeutic Interventions/Progress Updates:   [facility]. Pt requesting to take shower today - not scheduled for OT. Wheeled into bathroom and pt transferred WC<>shower chair with RW via stand<>pivot with min A. Pt performed multiple stands in bathroom with RW and min A throughout session. Despite cues, pt demonstrated poor adherence to LLE TDWB precautions without shoe on R foot. In standing, wrapped LLE in plastic bag while standing with CGA for balance and returned to sitting on shower chair. Pt able to wash UE/LE while seated on shower chair without assist. Assisted in drying pt's feet and donned non-skid socks and pt transferred back into WC via stand<>pivot with RW and min A. Donned clean pull over shirt and sweatshirt with supervision and donned clean brief in standing with max A - pt able to pull pants over hips with min A for standing balance. Pt sat in [place] and combed hair and brushed teeth with set up assist. [facility] and family present at bedside.   Therapy Documentation Precautions:  Precautions Precautions: Fall Restrictions Weight Bearing Restrictions: Yes LLE Weight Bearing: Touchdown weight bearing  Therapy/Group: Individual Therapy AAlfonse AlpersPT, DPT  04/11/2022, 7:09 AM

## 2022-04-11 NOTE — Progress Notes (Signed)
PROGRESS NOTE   Subjective/Complaints: Patient feels well this morning She notes CBG was 303 this morning and thinks it is because she drank coffee- it had no added sugar   ROS:  Pt denies SOB, abd pain, CP, N/V/C/D, and vision changes, +fatigue, +orthostasis, +shoulder pain, +lethargy this morning as per therapy  Objective:   No results found. Recent Labs    04/10/22 0955  WBC 6.4  HGB 10.5*  HCT 32.0*  PLT 421*     Recent Labs    04/10/22 0955  NA 137  K 4.3  CL 97*  CO2 27  GLUCOSE 373*  BUN 21  CREATININE 1.03*  CALCIUM 9.3      Intake/Output Summary (Last 24 hours) at 04/11/2022 1417 Last data filed at 04/11/2022 0820 Gross per 24 hour  Intake 1213 ml  Output --  Net 1213 ml        Physical Exam: Vital Signs Blood pressure (!) 109/55, pulse 93, temperature 98 F (36.7 C), resp. rate 16, height '5\' 2"'$  (1.575 m), weight 53.8 kg, SpO2 93 %. 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 Functional deficits which require 3+ hours  per day of interdisciplinary therapy in a comprehensive inpatient rehab setting. Physiatrist is providing close team supervision and 24 hour management of active medical problems listed below. Physiatrist and rehab team continue to assess barriers to discharge/monitor patient progress toward functional and medical goals  Care Tool:  Bathing    Body parts bathed by patient: Right arm, Left arm, Chest, Abdomen, Front perineal area, Buttocks, Right upper leg, Left upper leg, Face   Body parts bathed by helper: Front perineal area, Buttocks, Right lower leg, Left lower leg     Bathing assist Assist Level: Minimal Assistance - Patient > 75%     Upper Body Dressing/Undressing Upper body dressing   What is the patient wearing?: Pull over shirt    Upper body assist Assist Level: Contact Guard/Touching assist    Lower Body Dressing/Undressing Lower body dressing      What is the patient wearing?: Incontinence brief, Pants     Lower body assist Assist for lower body dressing: Moderate Assistance - Patient 50 - 74%     Toileting Toileting    Toileting assist Assist for toileting: Maximal Assistance - Patient 25 - 49%     Transfers Chair/bed  transfer  Transfers assist     Chair/bed transfer assist level: Contact Guard/Touching assist     Locomotion Ambulation   Ambulation assist   Ambulation activity did not occur: Safety/medical concerns (fatigue, poor adherance to TDWB precautions)  Assist level: Contact Guard/Touching assist Assistive device: Walker-rolling Max distance: 27 ft   Walk 10 feet activity   Assist  Walk 10 feet activity did not occur: Safety/medical concerns (fatigue, poor adherance to TDWB precautions)  Assist level: Contact Guard/Touching assist Assistive device: Walker-rolling   Walk 50 feet activity   Assist Walk 50 feet with 2 turns activity did not occur: Safety/medical concerns (fatigue, poor adherance to TDWB precautions)          Walk 150 feet activity   Assist Walk 150 feet activity did not occur: Safety/medical concerns (fatigue, poor adherance to TDWB precautions)         Walk 10 feet on uneven surface  activity   Assist Walk 10 feet on uneven surfaces activity did not occur: Safety/medical concerns (fatigue, poor adherance to TDWB precautions)         Wheelchair     Assist Is the patient using a wheelchair?: Yes Type of Wheelchair: Manual    Wheelchair assist level: Supervision/Verbal cueing Max wheelchair distance: 127f    Wheelchair 50 feet with 2 turns activity    Assist        Assist Level: Supervision/Verbal cueing   Wheelchair 150 feet activity     Assist      Assist Level: Supervision/Verbal cueing   Blood pressure (!) 109/55, pulse 93, temperature 98 F (36.7 C), resp. rate 16, height '5\' 2"'$  (1.575 m), weight 53.8 kg, SpO2 93 %.  Medical Problem List and Plan: 1. Functional deficits secondary to Left femoral neck fracture s/p percutaneous fixation on 03/29/22             -patient may shower, please cover incision             -ELOS/Goals: 12-14 days , PT/OT supervision to mod I             Continue CIR- PT and OT-   Vitamin D supplement started 2.  Impaired mobility: UKoreareviewed and is negative for clots. Continue Lovenox             -antiplatelet therapy: ASA '81mg'$  3. Pain from hip fracture: decrease Norco to '5mg'$  q6H prn due to hypotension  4. Daytime somnolence: decrease melatonin to '3mg'$  HS 5. Neuropsych/cognition: This patient is capable of making decisions on her own behalf. 6. Skin/Wound Care: Routine pressure relief measures.  7. Fluids/Electrolytes/Nutrition: Monitor I/O. Check CMET in am.  8. Left subcapital femur Fx s/p fixation: TDWB per Dr. HEarnestine Leys9.T1DM with prior episodes of hypoglycemia: continue omnipod and dexcom --will consult diabetic coordinator to assist with management.  Increase semglee to 14U daily.              --Followed by  Dr TMee Hives 10. Orthostatic hypotension: abdominal binder ordered. Hydrocodone dose lowered and frequency decreased to q6H prn --Also reports of diplopia per optho notes.  - Continue Midodrine for BP support. Compression garments ordered for during therapy.  11. CAD/Blood pressure: Denies chest pain 12. Depression/anxiety?: Continue Zoloft and Bupropion.  13. Acute blood loss anemia: Stable around 9.0. Recheck CBC in am.              -Continue iron supplementation 14. Hypokalemia: Was supplemented X 1 today. K+ 3.4 12/28. Recheck in am.  12/30- K+ 3.5- will recheck Monday 15. Constipation: changed senna to HS  12/30- LBM 2 days ago- is normal for her to go q2-3 days.  16. Fatigue: checked B12 level and is normal 17. Suboptimal magnesium: supplemented 2grams IV on 12/28, 1gm ordered 1/3. 18. Radiation induced nausea: discussed hyperbaric oxygen therapy outpatient. Scheduled compazine prior to breakfast. Resolved.  19. Suboptimal vitamin D level: ergocalciferol 50,000U ordered q7 days 20. Tachycardia: EKG ordered and shows sinus tachycardia, discussed with patient.  21. Lethargy: CBC and BMP ordered but appears to have resolved   LOS: 9 days A FACE TO FACE EVALUATION WAS PERFORMED  Martha Clan P Chrysta Fulcher 04/11/2022, 2:17 PM

## 2022-04-11 NOTE — Progress Notes (Signed)
Speech Language Pathology Daily Session Note  Patient Details  Name: Diana Frost MRN: [id number] Date of Birth: [birth year]  Today's Date: 04/11/2022 SLP Individual Time: 0920-1000 SLP Individual Time Calculation (min): 40 min  Short Term Goals: Week 1: SLP Short Term Goal 1 (Week 1): Patient will demonstrate functional problem solving for mildly complex tasks with supervision verbal cues. SLP Short Term Goal 2 (Week 1): Patient will recall new, daily information with Min cues for use of memory compensatory strategies.  Skilled Therapeutic Interventions: Pt seen this date for skilled ST intervention targeting cognitive goals outlined above. Pt received semi-reclined in bed and sleeping; aroused easily to name. In good spirits, though reports feeling "tired;" however, endorses that she slept well overnight. Able to remain alert, during today's session, without additional cueing. Agreeable to intervention at bedside. Pleasant and participatory throughout. Pt verbalized that she requested a shower; however, staff told her "no." Sent secure Epic chat to pt's PT to see if she could help pt with shower later this afternoon.  SLP facilitated functional recall and problem-solving with use of smart phone, verbal problem-solving, and sequencing tasks. Pt recalled activities completed from ST session on 1/5 given overall Sup-Min A verbal/question cues. Functional recall of medical events with Sup A (low blood sugar readings last evening, medications given this morning, AM meal). Ordered meals for the next 2 days given overall Min-Mod A verbal and visual cues to facilitate executive functioning skills to include mildly complex sequencing and problem-solving with smart phone and to assist with working memory (recall and entering of food services phone number). Effectively communicated meals and asked appropriate questions over the phone with Mod I; demonstrated functional working memory during this task.  Monitoring glucose readings with use of external aids (e.g. application on smart phone) with alerts set up at the Mod I level. Pt continues to report decreased recall re: use of insulin pump; however, pt is not using here in the hospital. Will reach out the diabetes educator to see if she if use of this device with be recommended at discharge.   Pt left in bed with all safety measures activated and call bell within reach. Continue per current ST POC, specifically cognitive training within the context of functional and preferred leisure tasks.   Pain No pain reported; NAD  Therapy/Group: Individual Therapy  Laquonda Welby A Khaleed Holan 04/11/2022, 1:17 PM

## 2022-04-12 DIAGNOSIS — I959 Hypotension, unspecified: Secondary | ICD-10-CM | POA: Diagnosis not present

## 2022-04-12 LAB — GLUCOSE, CAPILLARY
Glucose-Capillary: 252 mg/dL — ABNORMAL HIGH (ref 70–99)
Glucose-Capillary: 281 mg/dL — ABNORMAL HIGH (ref 70–99)
Glucose-Capillary: 277 mg/dL — ABNORMAL HIGH (ref 70–99)
Glucose-Capillary: 187 mg/dL — ABNORMAL HIGH (ref 70–99)
Glucose-Capillary: 165 mg/dL — ABNORMAL HIGH (ref 70–99)
Glucose-Capillary: 218 mg/dL — ABNORMAL HIGH (ref 70–99)
Glucose-Capillary: 30 mg/dL — CL (ref 70–99)
Glucose-Capillary: 41 mg/dL — CL (ref 70–99)
Glucose-Capillary: 55 mg/dL — ABNORMAL LOW (ref 70–99)
Glucose-Capillary: 63 mg/dL — ABNORMAL LOW (ref 70–99)
Glucose-Capillary: 114 mg/dL — ABNORMAL HIGH (ref 70–99)
Glucose-Capillary: 175 mg/dL — ABNORMAL HIGH (ref 70–99)

## 2022-04-12 MED ORDER — DEXTROSE 50 % IV SOLN: 12.5000 g | INTRAVENOUS | Status: AC

## 2022-04-12 MED ORDER — INSULIN GLARGINE-YFGN 100 UNIT/ML ~~LOC~~ SOLN
15.0000 [IU] | Freq: Every day | SUBCUTANEOUS | Status: DC
  Administered 2022-04-13 – 2022-04-14 (×2): 15 [IU] via SUBCUTANEOUS
  Filled 2022-04-12 (×2): qty 0.15

## 2022-04-12 MED ORDER — GLUCOSE 40 % PO GEL
ORAL | Status: AC
  Administered 2022-04-12: 1
  Filled 2022-04-12: qty 1.21

## 2022-04-12 MED ORDER — ACETAMINOPHEN 500 MG PO TABS
1000.0000 mg | ORAL_TABLET | Freq: Three times a day (TID) | ORAL | Status: DC
  Administered 2022-04-12 – 2022-04-23 (×31): 1000 mg via ORAL
  Filled 2022-04-12 (×32): qty 2

## 2022-04-12 MED ORDER — INSULIN ASPART 100 UNIT/ML IJ SOLN
2.0000 [IU] | Freq: Three times a day (TID) | INTRAMUSCULAR | Status: DC
  Administered 2022-04-13: 2 [IU] via SUBCUTANEOUS

## 2022-04-12 NOTE — Progress Notes (Addendum)
PROGRESS NOTE   Subjective/Complaints: Appears well this morning She notes hip swelling and pain in groin. She does not like using Norco or ice. Discussed scheduling tylenol   ROS:  Pt denies SOB, abd pain, CP, N/V/C/D, and vision changes, +fatigue, +orthostasis, +shoulder pain, +lethargy this morning as per therapy, +groin pain  Objective:   No results found. Recent Labs    04/10/22 0955  WBC 6.4  HGB 10.5*  HCT 32.0*  PLT 421*     Recent Labs    04/10/22 0955  NA 137  K 4.3  CL 97*  CO2 27  GLUCOSE 373*  BUN 21  CREATININE 1.03*  CALCIUM 9.3      Intake/Output Summary (Last 24 hours) at 04/12/2022 1425 Last data filed at 04/12/2022 1300 Gross per 24 hour  Intake 1064 ml  Output --  Net 1064 ml        Physical Exam: Vital Signs Blood pressure 119/64, pulse 93, temperature 98.2 F (36.8 C), temperature source Oral, resp. rate 17, height '5\' 2"'$  (1.575 m), weight 50.2 kg, SpO2 97 %. 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  Functional deficits which require 3+ hours per day of interdisciplinary therapy in a comprehensive inpatient rehab setting. Physiatrist is providing close team supervision and 24 hour management of active medical problems listed below. Physiatrist and rehab team continue to assess barriers to discharge/monitor patient progress toward functional and medical goals  Care Tool:  Bathing    Body parts bathed by patient: Right arm, Left arm, Chest, Abdomen, Front perineal area, Buttocks, Right upper leg, Left upper leg, Face   Body parts bathed by helper: Front perineal area, Buttocks, Right lower leg, Left lower leg     Bathing assist Assist Level: Minimal Assistance - Patient > 75%     Upper Body Dressing/Undressing Upper body dressing   What is the patient wearing?: Pull over shirt    Upper body assist Assist Level: Contact Guard/Touching assist    Lower Body Dressing/Undressing Lower body dressing      What is the patient wearing?: Incontinence brief, Pants     Lower body assist Assist for lower body dressing: Moderate Assistance - Patient 50 - 74%     Toileting Toileting    Toileting assist Assist for toileting: Maximal Assistance - Patient 25 - 49%     Transfers Chair/bed  transfer  Transfers assist     Chair/bed transfer assist level: Contact Guard/Touching assist     Locomotion Ambulation   Ambulation assist   Ambulation activity did not occur: Safety/medical concerns (fatigue, poor adherance to TDWB precautions)  Assist level: Contact Guard/Touching assist Assistive device: Walker-rolling Max distance: 27 ft   Walk 10 feet activity   Assist  Walk 10 feet activity did not occur: Safety/medical concerns (fatigue, poor adherance to TDWB precautions)  Assist level: Contact Guard/Touching assist Assistive device: Walker-rolling   Walk 50 feet activity   Assist Walk 50 feet with 2 turns activity did not occur: Safety/medical concerns (fatigue, poor  adherance to TDWB precautions)         Walk 150 feet activity   Assist Walk 150 feet activity did not occur: Safety/medical concerns (fatigue, poor adherance to TDWB precautions)         Walk 10 feet on uneven surface  activity   Assist Walk 10 feet on uneven surfaces activity did not occur: Safety/medical concerns (fatigue, poor adherance to TDWB precautions)         Wheelchair     Assist Is the patient using a wheelchair?: Yes Type of Wheelchair: Manual    Wheelchair assist level: Supervision/Verbal cueing Max wheelchair distance: 122f    Wheelchair 50 feet with 2 turns activity    Assist        Assist Level: Supervision/Verbal cueing   Wheelchair 150 feet activity     Assist      Assist Level: Supervision/Verbal cueing   Blood pressure 119/64, pulse 93, temperature 98.2 F (36.8 C), temperature source Oral, resp. rate 17, height '5\' 2"'$  (1.575 m), weight 50.2 kg, SpO2 97 %.  Medical Problem List and Plan: 1. Functional deficits secondary to Left femoral neck fracture s/Diana percutaneous fixation on 03/29/22             -patient may shower, please cover incision             -ELOS/Goals: 12-14 days , PT/OT supervision to mod I            Continue CIR- PT and OT-   Vitamin D supplement started 2.  Impaired mobility: UKoreareviewed and is negative for clots. Continue Lovenox             -antiplatelet therapy: ASA '81mg'$  3. Pain from hip fracture: decrease Norco to '5mg'$  q6H prn due to hypotension. Scheduled tylenol increased to 1,'000mg'$  TID.  4. Daytime somnolence: decrease melatonin to '3mg'$  HS 5. Neuropsych/cognition: This patient is capable of making decisions on her own behalf. 6. Skin/Wound Care: Routine pressure relief measures.  7. Fluids/Electrolytes/Nutrition: Monitor I/O. Check CMET in am.  8. Left subcapital femur Fx s/Diana fixation: TDWB per Dr. HEarnestine Leys9.T1DM with prior episodes of hypoglycemia: continue omnipod and dexcom --will consult  diabetic coordinator to assist with management.   1/7: hypoglycemic to 30: decrease Novolog to 2U TID and increase semglee to 15U daily.              --Followed by Dr TMee Hives 10. Orthostatic hypotension: abdominal binder ordered. Hydrocodone dose lowered and frequency decreased to q6H prn --Also reports of diplopia per optho notes.  - Continue Midodrine for BP support. Compression garments ordered for during therapy.  11. CAD/Blood pressure: Denies chest pain 12. Depression/anxiety?: Continue Zoloft and Bupropion.  13. Acute blood loss anemia: Stable around 9.0. Recheck CBC in am.              -  Continue iron supplementation 14. Hypokalemia: Was supplemented X 1 today. K+ 3.4 12/28. Recheck in am.   12/30- K+ 3.5- will recheck Monday 15. Constipation: changed senna to HS  12/30- LBM 2 days ago- is normal for her to go q2-3 days.  16. Fatigue: checked B12 level and is normal 17. Suboptimal magnesium: supplemented 2grams IV on 12/28, 1gm ordered 1/3. 18. Radiation induced nausea: discussed hyperbaric oxygen therapy outpatient. Scheduled compazine prior to breakfast. Resolved.  19. Suboptimal vitamin D level: ergocalciferol 50,000U ordered q7 days 20. Tachycardia: EKG ordered and shows sinus tachycardia, discussed with patient.  21. Lethargy: CBC and BMP ordered but appears to have resolved   LOS: 10 days A FACE TO FACE EVALUATION WAS PERFORMED  Diana Frost Diana Frost 04/12/2022, 2:25 PM

## 2022-04-12 NOTE — Progress Notes (Addendum)
Hypoglycemic Event  CBG: 55   Treatment: 1 tube glucose gel  Symptoms: None  Follow-up CBG: Time:1759 CBG Result:63  Rechecked blood sugar at 1900-- result 114   Possible Reasons for Event: Medication regimen:    Comments/MD notified:MD Raulkar notified of interventions not being effective. MD raulkar instructed to recheck in 40 minutes and if continues to drop then administer d5 IV. MD raulkar verbalized pt blood sugar of 63 is acceptable if no symptoms.Charge nurse made aware    Sheela Stack

## 2022-04-12 NOTE — Progress Notes (Addendum)
Hypoglycemic Event  CBG: 41   Treatment: 8 oz juice/soda, yougurt  Symptoms: None  Follow-up CBG: Time:1730  CBG Result:55   Possible Reasons for Event: Medication regimen:    Comments/MD notified    Diana Frost

## 2022-04-12 NOTE — Progress Notes (Signed)
Hypoglycemic Event  CBG: 30   Treatment: 8 oz juice/soda, peanut butter, graham crackers  Symptoms: None  Follow-up CBG: [id number]:[id number]  CBG Result:41  Possible Reasons for Event: Medication regimen:    Comments/MD notified:yes    Diana Frost

## 2022-04-12 NOTE — Progress Notes (Addendum)
This nurse notified of critical low CBG of 30 and went to room to assess patient after notified of blood glucose reading by bedside nurse and the interventions she had attempted were not working. Blood glucose increased to 63 per results review. MD notified of low blood glucose and asked for new orders MD stated to assess patient for symptoms and if she was feeling symptomatic to give D 50 IV push via hypoglycemia standing order set. Patient denies symptoms of hypoglycemia and after 30 minutes of monitoring patients CGM notified of a CBG of 78. Bedside nurse to recheck cbg per protocol. Plan of care in progress, safety ensured

## 2022-04-13 DIAGNOSIS — R000 Tachycardia, unspecified: Secondary | ICD-10-CM

## 2022-04-13 LAB — GLUCOSE, CAPILLARY
Glucose-Capillary: 246 mg/dL — ABNORMAL HIGH (ref 70–99)
Glucose-Capillary: 372 mg/dL — ABNORMAL HIGH (ref 70–99)
Glucose-Capillary: 210 mg/dL — ABNORMAL HIGH (ref 70–99)
Glucose-Capillary: 145 mg/dL — ABNORMAL HIGH (ref 70–99)
Glucose-Capillary: 326 mg/dL — ABNORMAL HIGH (ref 70–99)

## 2022-04-13 LAB — CBC
HCT: 29 % — ABNORMAL LOW (ref 36.0–46.0)
Hemoglobin: 9.8 g/dL — ABNORMAL LOW (ref 12.0–15.0)
MCH: 32.6 pg (ref 26.0–34.0)
MCHC: 33.8 g/dL (ref 30.0–36.0)
MCV: 96.3 fL (ref 80.0–100.0)
Platelets: 353 10*3/uL (ref 150–400)
RBC: 3.01 MIL/uL — ABNORMAL LOW (ref 3.87–5.11)
RDW: 14.4 % (ref 11.5–15.5)
WBC: 4.6 10*3/uL (ref 4.0–10.5)
nRBC: 0 % (ref 0.0–0.2)

## 2022-04-13 LAB — BASIC METABOLIC PANEL
Anion gap: 6 (ref 5–15)
BUN: 20 mg/dL (ref 8–23)
CO2: 28 mmol/L (ref 22–32)
Calcium: 8.4 mg/dL — ABNORMAL LOW (ref 8.9–10.3)
Chloride: 100 mmol/L (ref 98–111)
Creatinine, Ser: 0.81 mg/dL (ref 0.44–1.00)
GFR, Estimated: >60 mL/min (ref >60)
Glucose, Bld: 267 mg/dL — ABNORMAL HIGH (ref 70–99)
Potassium: 4 mmol/L (ref 3.5–5.1)
Sodium: 134 mmol/L — ABNORMAL LOW (ref 135–145)

## 2022-04-13 LAB — GLUCOSE, RANDOM: Glucose, Bld: 89 mg/dL (ref 70–99)

## 2022-04-13 MED ORDER — INSULIN ASPART 100 UNIT/ML IJ SOLN
2.0000 [IU] | Freq: Two times a day (BID) | INTRAMUSCULAR | Status: DC
  Administered 2022-04-13 – 2022-04-17 (×6): 2 [IU] via SUBCUTANEOUS

## 2022-04-13 MED ORDER — DOCUSATE SODIUM 100 MG PO CAPS
200.0000 mg | ORAL_CAPSULE | Freq: Every day | ORAL | Status: DC
  Administered 2022-04-13 – 2022-04-20 (×8): 200 mg via ORAL
  Filled 2022-04-13 (×8): qty 2

## 2022-04-13 NOTE — Progress Notes (Signed)
Occupational Therapy Weekly Progress Note  Patient Details  Name: Diana Frost MRN: [id number] Date of Birth: 1945/11/11  Beginning of progress report period: April 03, 2022 End of progress report period: April 13, 2022   Patient has met 3 of 3 short term goals. Pt is making gradual progress towards LTGs. Pt is at a CGA-min A stand pivot transfer level using RW with consistent cues needed for RW positioning and maintaining TDWB adherence on her LLE. She is able to bathe at an overall min A level, dress with min to mod A and requires mod assist for toileting tasks. Pt continues to demonstrate deficits in endurance, activity tolerance, poor adherence to TDWB adherence on the LLE during ADLs/transfers, and decreased awareness resulting in difficulty completing BADL tasks without increased physical assist. Pt will benefit from continued skilled OT services to focus on mentioned deficits. Her daughter has been involved but would benefit from family education regarding ADL and functional transfer recommendations prior to discharge.  Patient continues to demonstrate the following deficits: muscle weakness, decreased cardiorespiratoy endurance, decreased coordination, decreased awareness and decreased problem solving, and decreased standing balance and difficulty maintaining precautions and therefore will continue to benefit from skilled OT intervention to enhance overall performance with BADL, iADL, and Reduce care partner burden.  Patient progressing toward long term goals..  Continue plan of care.  OT Short Term Goals Week 1:  OT Short Term Goal 1 (Week 1): Pt will complete 1/3 toileting steps with CGA for standing balance using RW OT Short Term Goal 1 - Progress (Week 1): Met OT Short Term Goal 2 (Week 1): Pt will complete toilet transfer with CGA using RW OT Short Term Goal 2 - Progress (Week 1): Met OT Short Term Goal 3 (Week 1): Pt will use AE PRN during LB clothing with min cues needed  for technique OT Short Term Goal 3 - Progress (Week 1): Met Week 2:  OT Short Term Goal 1 (Week 2): STG = LTG due to [place], [place], OTR/L  04/13/2022, 7:51 AM

## 2022-04-13 NOTE — Progress Notes (Signed)
PROGRESS NOTE   Subjective/Complaints: Pt and daughter concerned about labile CBGs. No additional concerns or complaints. They would like to speak with DM coordinator- who came by later in the day.    ROS:  Pt denies SOB, abd pain, HA, CP, N/V/C/D, and vision changes, +fatigue, +orthostasis, +shoulder pain, +groin pain  Objective:   No results found. Recent Labs    04/10/22 0955 04/13/22 0619  WBC 6.4 4.6  HGB 10.5* 9.8*  HCT 32.0* 29.0*  PLT 421* 353      Recent Labs    04/10/22 0955 04/13/22 0619  NA 137 134*  K 4.3 4.0  CL 97* 100  CO2 27 28  GLUCOSE 373* 267*  BUN 21 20  CREATININE 1.03* 0.81  CALCIUM 9.3 8.4*       Intake/Output Summary (Last 24 hours) at 04/13/2022 0810 Last data filed at 04/12/2022 2100 Gross per 24 hour  Intake 1485 ml  Output --  Net 1485 ml         Physical Exam: Vital Signs Blood pressure 127/72, pulse 93, temperature 98.9 F (37.2 C), temperature source Oral, resp. rate 16, height '5\' 2"'$  (1.575 m), weight 50.2 kg, SpO2 95 %. 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 Functional deficits which require 3+ hours per day of interdisciplinary therapy in a comprehensive inpatient rehab setting. Physiatrist is providing close team supervision and 24 hour management of active medical problems listed below. Physiatrist and rehab team continue to assess barriers to discharge/monitor patient progress toward functional and medical goals  Care Tool:  Bathing    Body parts bathed by patient: Right arm, Left arm, Chest, Abdomen, Front perineal area, Buttocks, Right upper leg, Left upper leg, Face   Body parts bathed by helper: Front perineal area, Buttocks, Right lower leg, Left lower leg     Bathing assist Assist Level: Minimal Assistance - Patient > 75%     Upper Body Dressing/Undressing Upper body dressing   What is the patient wearing?: Pull over shirt    Upper body assist Assist Level: Contact Guard/Touching assist    Lower Body Dressing/Undressing Lower body dressing      What is the patient wearing?: Incontinence brief, Pants     Lower body assist Assist for lower body dressing: Moderate Assistance - Patient 50 - 74%     Toileting Toileting    Toileting assist Assist  for toileting: Maximal Assistance - Patient 25 - 49%     Transfers Chair/bed transfer  Transfers assist     Chair/bed transfer assist level: Contact Guard/Touching assist     Locomotion Ambulation   Ambulation assist   Ambulation activity did not occur: Safety/medical concerns (fatigue, poor adherance to TDWB precautions)  Assist level: Contact Guard/Touching assist Assistive device: Walker-rolling Max distance: 27 ft   Walk 10 feet activity   Assist  Walk 10 feet activity did not occur: Safety/medical concerns (fatigue, poor adherance to TDWB precautions)  Assist level: Contact Guard/Touching assist Assistive device: Walker-rolling   Walk 50 feet activity   Assist Walk 50 feet with 2 turns activity did not occur:  Safety/medical concerns (fatigue, poor adherance to TDWB precautions)         Walk 150 feet activity   Assist Walk 150 feet activity did not occur: Safety/medical concerns (fatigue, poor adherance to TDWB precautions)         Walk 10 feet on uneven surface  activity   Assist Walk 10 feet on uneven surfaces activity did not occur: Safety/medical concerns (fatigue, poor adherance to TDWB precautions)         Wheelchair     Assist Is the patient using a wheelchair?: Yes Type of Wheelchair: Manual    Wheelchair assist level: Supervision/Verbal cueing Max wheelchair distance: 159f    Wheelchair 50 feet with 2 turns activity    Assist        Assist Level: Supervision/Verbal cueing   Wheelchair 150 feet activity     Assist      Assist Level: Supervision/Verbal cueing   Blood pressure 127/72, pulse 93, temperature 98.9 F (37.2 C), temperature source Oral, resp. rate 16, height '5\' 2"'$  (1.575 m), weight 50.2 kg, SpO2 95 %.  Medical Problem List and Plan: 1. Functional deficits secondary to Left femoral neck fracture s/p percutaneous fixation on 03/29/22             -patient may shower, please cover incision             -ELOS/Goals: 04/23/22 , sup-min A for safety            Continue CIR- PT and OT-   Vitamin D supplement started 2.  Impaired mobility: UKoreareviewed and is negative for clots. Continue Lovenox             -antiplatelet therapy: ASA '81mg'$  3. Pain from hip fracture: decrease Norco to '5mg'$  q6H prn due to hypotension. Scheduled tylenol increased to 1,'000mg'$  TID.  4. Daytime somnolence: decrease melatonin to '3mg'$  HS 5. Neuropsych/cognition: This patient is capable of making decisions on her own behalf. 6. Skin/Wound Care: Routine pressure relief measures.  7. Fluids/Electrolytes/Nutrition: Monitor I/O. Check CMET in am.  8. Left subcapital femur Fx s/p fixation: TDWB per Dr. HEarnestine Leys9.T1DM with prior episodes of hypoglycemia: continue  omnipod and dexcom --will consult diabetic coordinator to assist with management.   1/7: hypoglycemic to 30: decrease Novolog to 2U TID and increase semglee to 15U daily.              --Followed by Dr TMee Hives  -1/8 stop Novolog 2u TID to reduce risk of hypoglycemia, appreciate DM coordinator assistance 10. Orthostatic hypotension: abdominal binder ordered. Hydrocodone dose lowered and frequency decreased to q6H prn --Also reports of diplopia per optho notes.  - Continue Midodrine for BP support. Compression garments ordered for during therapy.  11. CAD/Blood pressure: Denies chest pain 12. Depression/anxiety?:  Continue Zoloft and Bupropion.  13. Acute blood loss anemia: Stable around 9.0. Recheck CBC in am.              -Continue iron supplementation  -1/8 HGB stable at 9.8 14. Hypokalemia: Was supplemented X 1 today. K+ 3.4 12/28. Recheck in am.   12/30- K+ 3.5- will recheck Monday  1/8 K+ stable at 4.0 15. Constipation: changed senna to HS  12/30- LBM 2 days ago- is normal for her to go q2-3 days.  16. Fatigue: checked B12 level and is normal 17. Suboptimal magnesium: supplemented 2grams IV on 12/28, 1gm ordered 1/3. 18. Radiation induced nausea: discussed hyperbaric oxygen therapy outpatient. Scheduled compazine prior to breakfast. Resolved.  19. Suboptimal vitamin D level: ergocalciferol 50,000U ordered q7 days 20. Tachycardia: EKG ordered and shows sinus tachycardia, discussed with patient.   -1/8 HR has been stable in 90s, monitor 21. Lethargy: CBC and BMP ordered but appears to have resolved   LOS: 11 days A FACE TO FACE EVALUATION WAS PERFORMED  Jennye Boroughs 04/13/2022, 8:10 AM

## 2022-04-13 NOTE — Progress Notes (Signed)
Physical Therapy Session Note  Patient Details  Name: Diana Frost MRN: [id number] Date of Birth: Nov 15, 1945  Today's Date: 04/13/2022 PT Individual Time: 0801-0911 PT Individual Time Calculation (min): 70 min   Short Term Goals: Week 1:  PT Short Term Goal 1 (Week 1): pt will perform bed mobility with CGA overall PT Short Term Goal 1 - Progress (Week 1): Met PT Short Term Goal 2 (Week 1): pt will transfer sit<>stand with LRAD and CGA PT Short Term Goal 2 - Progress (Week 1): Met PT Short Term Goal 3 (Week 1): pt will initate gait training with RW PT Short Term Goal 3 - Progress (Week 1): Met Week 2:  PT Short Term Goal 1 (Week 2): pt will transfer bed<>chair with LRAD and supervision PT Short Term Goal 2 (Week 2): pt will transfer sit<>stand with LRAD and supervision PT Short Term Goal 3 (Week 2): pt will ambulate 97f with LRAD and supervision while adhering to TDWB precautions  Skilled Therapeutic Interventions/Progress Updates:   Received pt semi-reclined in bed, pt agreeable to PT treatment, and denied any pain at start of session but increased with mobility. Session with emphasis on functional mobility/transfers, dressing, dynamic standing balance/coordination, and gait training. Donned ted hose with total A for BP management and pt transferred semi-reclined<>sitting EOB with HOB elevated and use of bedrails with supervision - pt reports her bed at home is adjustable. Pt reported her brief was soiled and stated that she is aware when she needs to void but that it's "easier" to go in brief. Again, discouraged this, and encouraged calling for NT/RN to promote mobility/increase strength and maintain skin integrity.   Donned L non-skid sock and R shoe with max A. Pt stood x3 reps from EOB with RW and CGA. Removed soiled brief, pt performed LB washing with min A for balance, and donned clean brief in standing with max A. Returned to sitting EOB and donned pants with max A - required max A  to pull pants over hips in standing and mod A for balance due to posterior bias with only 1 UE support. Pt transferred into WC via stand<>pivot with CGA - cues to keep RW under shoulders and to reach back prior to sitting. Pt sat in WSebastianat sink and brushed teeth/washed face with set up assist. Pt performed WC mobility 1514fusing BUE with significantly increased time and supervision to main therapy gym - cues for hand placement on WC rims to increase propulsion stride. [facility] and CGA - demonstrated continued posterior lean requiring min A for balance and attempted to ambulate but stopped due to reporting feeling dizzy - returned to sitting.   BP sitting: 129/67 - symptoms resolved with seated rest BP standing: 78/49 reassessed: 77/54 - pt asymptomatic Donned abdominal binder and reassessed BP in standing: 79/50 - pt asymptomatic  Stood again with RW and CGA and attempted to ambulate, however pt unable to lift R foot. Provided pt with visual demonstration for technique with emphasis on pushing through BUE when stepping R foot forward and pt ambulated 1454fith RW and CGA with close WC follow - limited by fatigue and increase in L hip pain (possibly putting too much weight through LLE). Pt stood x 3 additional trials with RW and CGA and worked on dynAssociate Professorrseshoes with RUE x 3 with LLE on therapist foot - pt with good adherence to TDWB precautions during activity. [facility]. Concluded session with pt sitting in [facility] with daughter present at bedside planning to take pt off unit.   Therapy Documentation Precautions:  Precautions Precautions: Fall Restrictions Weight Bearing Restrictions: Yes LLE Weight Bearing: Touchdown weight bearing  Therapy/Group: Individual Therapy Alfonse Alpers PT, DPT  04/13/2022, 6:50 AM

## 2022-04-13 NOTE — Progress Notes (Signed)
Occupational Therapy Session Note  Patient Details  Name: Diana Frost MRN: [id number] Date of Birth: 1945/09/30  Today's Date: 04/13/2022 OT Individual Time: 1110-1201 & 4081-4481 OT Individual Time Calculation (min): 51 min  & 70 min Today's Date: 04/13/2022 OT Missed Time: 9 Minutes Missed Time Reason: Other (comment) (PA and nursing diabetic coordinator in room discussing insulin management)   Short Term Goals: Week 1:  OT Short Term Goal 1 (Week 1): Pt will complete 1/3 toileting steps with CGA for standing balance using RW OT Short Term Goal 2 (Week 1): Pt will complete toilet transfer with CGA using RW OT Short Term Goal 3 (Week 1): Pt will use AE PRN during LB clothing with min cues needed for technique  Skilled Therapeutic Interventions/Progress Updates:  Session 1 Upon OT first arrival, pt was received seated in w/c, with nursing diabetes coordinator, PA and daughter present discussing insulin pump management and concerns. Offered to visit pt at later time due to current discussion.  Revisited pt at later time, with pt missing overall 9 mins of OT intervention 2/2 nursing care and revisit needed. Will attempt to make up missed mins as OT schedule allows.  Skilled OT intervention completed with focus on toileting needs, family education, functional transfers. Pt received in standing in front of toilet with NT outside of bathroom. Pt was indicating that she was finished voiding. OT took over at direct care handoff. No pain reported during session.  Pt was in stance holding onto grab bar with L hand, however without RW and w/c directly in front of her. Min A needed for wiping and donning pants over hips. Pt initiated stand pivot initially with min A for full stand pivot to sit in w/c however with poor TTWB adherence on LLE and during pivoting to sit, pt's LLE being tangled with her RLE requiring mod A to sit safely in w/c Reinforced to pt and NT that pt needs to utilize RW for  stand pivot especially for TTWB adherence.   Transported pt dependently in w/c > laundry room for time. Completed CGA sit > stand using RW, good adherence to TTWB on LLE during transfer of dirty clothing into washing machine. Supervision for sequencing steps of starting the wash cycle.  Attempted stand pivot transfer to EOM with daughter present for observation. Pt demonstrated full flat foot WB on LLE with attempt to pick up R foot for "walking" despite discussing stand pivot to mat. Pt reports shimmying foot is hard for despite shoe being on R foot. Completed 5 ft ambulatory transfer per pt initiation with w/c follow for safety with overall CGA, however pt with increasing WB on LLE with termination cues needed to sit. Time spent discussing with pt and daughter difference between TTWB (egg under foot) and PWB (a certain %), with discussion also had about pt's need of supervision for cues and anticipate pt will need similar assist at DC with daughter in agreement.  Back in room, pt remained seated in w/c with daughter in room,  LLE propped on leg rest with pillow for comfort, and all needs in reach at end of session.  Session 2 Skilled OT intervention completed with focus on functional endurance and safety within a shower context. Pt received seated in w/c, agreeable to session. No pain reported.  Pt agreeable to shower. Transported dependently in w/c > shower, then CGA sit > stand using grab bar and CGA stand pivot to tub bench with good adherence to TTWB on LLE. Doffed clothing  with overall min A at the sit > stand level. OT applied water proof cover to IV on R wrist; did not cover L hip dressing as it was a mepilex bandage but did remove and reapply new one after shower. Able to bathe at the overall CGA level in stance for peri-regions, with use of LH sponge also for BLE and back.   Pt did stand without direct supervision/assist upon prepping to exit shower, requiring min A stand pivot to maintain  TTWB on LLE to w/c. Able to dress with supervision for UB, and min A for LB including pre-threaded brief and pants. Cue needed again for threading weaker, then using RW and CGA for stance, pt was able to assist with donning pants over hips. Hair grooming with set up A at stink. Donned shoe/sock with total A for time.  Transported dependently in w/c <> laundry room for time. Completed CGA sit > stand with washing machine for UE support. Able to maintain balance with CGA/min A, with education provided on use of reacher to access clothing. Able to grip clothing from washing machine using reacher however difficulty with balance with bending to transfer items to low drier. Min A needed to transfer from top of drier to front loading opening.  Back in room, assisted pt with CGA sit > stand and stand pivot to EOB with RW, then supervision bed mobility to supine.   Reviewed pt's home shower set up, with report of small step to enter. Discussed RW management for step in transfer to manage TTWB on LLE with need to practice this strategy prior to DC.  Pt remained supine in bed, with bed alarm on/activated, and with all needs in reach at end of session.   Therapy Documentation Precautions:  Precautions Precautions: Fall Restrictions Weight Bearing Restrictions: Yes LLE Weight Bearing: Touchdown weight bearing    Therapy/Group: Individual Therapy  Blase Mess, MS, OTR/L  04/13/2022, 3:33 PM

## 2022-04-13 NOTE — Inpatient Diabetes Management (Signed)
Inpatient Diabetes Program Recommendations  AACE/ADA: New Consensus Statement on Inpatient Glycemic Control (2015)  Target Ranges:  Prepandial:   less than 140 mg/dL      Peak postprandial:   less than 180 mg/dL (1-2 hours)      Critically ill patients:  140 - 180 mg/dL   Lab Results  Component Value Date   GLUCAP 372 (H) 04/13/2022   HGBA1C 7.2 (H) 04/01/2022    Review of Glycemic Control  Latest Reference Range & Units 04/12/22 12:08 04/12/22 16:13 04/12/22 16:47 04/12/22 17:25 04/12/22 17:59 04/12/22 18:57 04/12/22 20:04 04/13/22 06:01 04/13/22 09:31  Glucose-Capillary 70 - 99 mg/dL 218 (H) 30 (LL) 41 (LL) 55 (L) 63 (L) 114 (H) 175 (H) 246 (H) 372 (H)  (LL): Data is critically low (H): Data is abnormally high (L): Data is abnormally low  Diabetes history: DM1(does not make insulin.  Needs correction, basal and meal coverage)  Outpatient Diabetes medications:  Omnipod insulin pump with the Dexcom G6 CGM Dr. Honor Junes is her endocrinologist  Basal rates 12 AM = 0.3 9 AM = 0.5 12 PM = 0.45 6 PM = 0.4 9 PM = 0.2 TDD basal: 8.7 units  Bolus settings I/C: 13. ISF: 60 Target Glucose: 120 Active insulin time: 5 hours   Current orders for Inpatient glycemic control:  Novolog 0-6 units & 0-5 units QHS Novolog 2 units TID with meals Semglee 15 units QD  Spoke with Dr. Erling Cruz this morning.  Diana Frost glucose has dropped in the 40's after therapy in the afternoon the last 2 days.  Dr. Erling Cruz will discontinue the 2 units of Novolog at lunch time to help prevent hypoglycemia after therapy.    Spoke with patient and daughter, Diana Frost at bedside.  Diana Frost has had diabetes since age 77.  She has used pump therapy for approximately 20 yrs.  She is very well managed at home with an average A1C of <7%, sees Dr. Honor Junes in [place].  Unfortunately, she has hypoglycemia unawareness.  We ensured her Dexcom volume was turned up so she would hear her low alerts.  Her daughter is also  able to see her mothers Dexcom readings on her phone and receives alerts as well.    Discussed with Diana Frost and Diana Frost possibilities for fluctuations in glucose including, therapy, rebounding from being treated for lows and spikes in glucose after eating.  They both verbalize understanding.    Will continue to follow while inpatient.  Thank you, [name], MSN, [name] Diabetes Coordinator Inpatient Diabetes Program [phone] (team pager from 8a-5p)

## 2022-04-14 LAB — GLUCOSE, CAPILLARY
Glucose-Capillary: 189 mg/dL — ABNORMAL HIGH (ref 70–99)
Glucose-Capillary: 246 mg/dL — ABNORMAL HIGH (ref 70–99)
Glucose-Capillary: 307 mg/dL — ABNORMAL HIGH (ref 70–99)
Glucose-Capillary: 58 mg/dL — ABNORMAL LOW (ref 70–99)
Glucose-Capillary: 57 mg/dL — ABNORMAL LOW (ref 70–99)
Glucose-Capillary: 126 mg/dL — ABNORMAL HIGH (ref 70–99)
Glucose-Capillary: 213 mg/dL — ABNORMAL HIGH (ref 70–99)

## 2022-04-14 MED ORDER — INSULIN GLARGINE-YFGN 100 UNIT/ML ~~LOC~~ SOLN
16.0000 [IU] | Freq: Every day | SUBCUTANEOUS | Status: DC
  Administered 2022-04-15 – 2022-04-16 (×2): 16 [IU] via SUBCUTANEOUS
  Filled 2022-04-14 (×2): qty 0.16

## 2022-04-14 MED ORDER — HYDROCODONE-ACETAMINOPHEN 5-325 MG PO TABS
1.0000 | ORAL_TABLET | Freq: Three times a day (TID) | ORAL | Status: DC | PRN
  Administered 2022-04-14: 1 via ORAL
  Filled 2022-04-14: qty 1

## 2022-04-14 MED ORDER — INSULIN ASPART 100 UNIT/ML IJ SOLN
0.0000 [IU] | Freq: Two times a day (BID) | INTRAMUSCULAR | Status: DC
  Administered 2022-04-15: 1 [IU] via SUBCUTANEOUS
  Administered 2022-04-16: 5 [IU] via SUBCUTANEOUS
  Administered 2022-04-17: 4 [IU] via SUBCUTANEOUS
  Administered 2022-04-17: 3 [IU] via SUBCUTANEOUS
  Administered 2022-04-18: 6 [IU] via SUBCUTANEOUS

## 2022-04-14 MED ORDER — INSULIN ASPART 100 UNIT/ML IJ SOLN
2.0000 [IU] | Freq: Every day | INTRAMUSCULAR | Status: DC
  Administered 2022-04-15 – 2022-04-16 (×2): 2 [IU] via SUBCUTANEOUS

## 2022-04-14 NOTE — Inpatient Diabetes Management (Addendum)
Inpatient Diabetes Program Recommendations  AACE/ADA: New Consensus Statement on Inpatient Glycemic Control (2015)  Target Ranges:  Prepandial:   less than 140 mg/dL      Peak postprandial:   less than 180 mg/dL (1-2 hours)      Critically ill patients:  140 - 180 mg/dL   Lab Results  Component Value Date   GLUCAP 246 (H) 04/14/2022   HGBA1C 7.2 (H) 04/01/2022    Review of Glycemic Control  Latest Reference Range & Units 04/13/22 09:31 04/13/22 12:05 04/13/22 16:55 04/13/22 21:13 04/14/22 03:15 04/14/22 06:04  Glucose-Capillary 70 - 99 mg/dL 372 (H) 210 (H) 145 (H) 326 (H) 189 (H) 246 (H)  (H): Data is abnormally high  Diabetes history: DM1  Current orders for Inpatient glycemic control:  Semglee 15 QD, Novolog 0-6 units TID and 0-5 units QHS, Novolog 2 units with BF and dinner  Inpatient Diabetes Program Recommendations:    Semglee 16 units QAM (to start 04/15/22)  Addendum'@12'$ :02:  Met with Diana Frost at bedside.  She states it is time to change her Dexcom.  She removed her Dexcom from her left abdomen and placed new Dexcom on the back of her right arm.  Spoke with Hope Budds, RN.  She has not needed Norco/Vicodin since 04/12/22 at 04:17.  Hope Budds says her mentation is appropriate.  If she remains alert and oriented and not requiring Norco, might consider allowing her to use her pump.  The pump would need to be placed 24 hrs after last Semglee dose.    Will continue to follow while inpatient.  Thank you, [name], MSN, [place] Diabetes Coordinator Inpatient Diabetes Program [phone] (team pager from 8a-5p)

## 2022-04-14 NOTE — Progress Notes (Signed)
Patient got 4 units novolog at lunch with drop in BS to 58. Will change SSI for use with breakfast and supper. Add 2 units after lunch prn BS >250.

## 2022-04-14 NOTE — Progress Notes (Signed)
Speech Language Pathology Daily Session Note  Patient Details  Name: Diana Frost MRN: [id number] Date of Birth: 06-07-1945  Today's Date: 04/14/2022 SLP Individual Time: 0850-0930    Short Term Goals: Week 1: SLP Short Term Goal 1 (Week 1): Patient will demonstrate functional problem solving for mildly complex tasks with supervision verbal cues. SLP Short Term Goal 2 (Week 1): Patient will recall new, daily information with Min cues for use of memory compensatory strategies.  Skilled Therapeutic Interventions:   Pt seen for skilled SLP session to address functional cognitive goals of safety situational problem solving, sequencing, and attention. Pt participated in verbal sequencing of daily activities such as laundry and meal preparation. Pt completed sequencing tasks with >90% accuracy given min cues to maintain attention. Her thought process is intermittently tangential and needs cues to redirect back to subject. Pt completed structured grocery list problem solving task involving selection of grocery items from sale sheet and figuring amounts. Pt needed min cues to transfer all mounts correctly to her list and achieve correct total amount of grocery items.   Recommend continue skilled SLP intervention to address cognitive goals.   Pain Pain Assessment Pain Scale: 0-10 Pain Score: 0-No pain  Therapy/Group: Individual Therapy  Wyn Forster 04/14/2022, 9:12 AM

## 2022-04-14 NOTE — Progress Notes (Signed)
Occupational Therapy Session Note  Patient Details  Name: Diana Frost MRN: [id number] Date of Birth: 1945/07/10  Today's Date: 04/14/2022 OT Individual Time: 1020-1118 OT Individual Time Calculation (min): 58 min    Short Term Goals: Week 2:  OT Short Term Goal 1 (Week 2): STG = LTG due to ELOS  Skilled Therapeutic Interventions/Progress Updates:  Skilled OT intervention completed with focus on IADL management, BUE/core endurance needed for independence with BADLs. Pt received seated in recliner, agreeable to session. No pain reported.  PA in room, asking therapist pt's habits with nausea and eating and potential timing disruption of therapy/movement effecting pt's symptoms with gastroparesis. Scheduling notified of PA's request for pt to start therapies mid morning.  Pt declined self-care needs at this time however requested to retrieve clothes from drier per PM session yesterday afternoon. Pt self-propelled in w/c about 70 ft prior to fatigue, with supervision needed for navigation. Transported dependently in w/c rest of way and <> gym for time.  Retrieved clothes from drier from w/c level, then seated at low table, OT donned 3 pound wrist weights and applied to each wrist for folding of clothes with focus on BUE endurance. Pt did indicate fatigue, but able to complete without rest breaks.  With same wrist weights applied, pt then transferred with R and L hand clips from table to clip tower for continued BUE endurance.  Transitioned to 3 pound dowel for the following exercises for continued BUE endurance with core activation as well: (Cues needed for scooting forward in w/c, for upright posture and back unsupported and feet on ground) -chest presses, over head presses, bicep curls and modified seated RDLs to shin level all 2x12  Back in room, pt remained seated in w/c, with chair pad alarm on/activated, LLE propped up with leg rest/pillow for comfort and with all needs in reach at  end of session.   Therapy Documentation Precautions:  Precautions Precautions: Fall Restrictions Weight Bearing Restrictions: Yes LLE Weight Bearing: Touchdown weight bearing    Therapy/Group: Individual Therapy  Blase Mess, MS, OTR/L  04/14/2022, 12:10 PM

## 2022-04-14 NOTE — Progress Notes (Signed)
Physical Therapy Session Note  Patient Details  Name: Diana Frost MRN: [id number] Date of Birth: 1945-08-16  Today's Date: 04/14/2022 PT Individual Time: 0800-0826 and 1400-1456 PT Individual Time Calculation (min): 26 min and 56 min  Short Term Goals: Week 1:  PT Short Term Goal 1 (Week 1): pt will perform bed mobility with CGA overall PT Short Term Goal 1 - Progress (Week 1): Met PT Short Term Goal 2 (Week 1): pt will transfer sit<>stand with LRAD and CGA PT Short Term Goal 2 - Progress (Week 1): Met PT Short Term Goal 3 (Week 1): pt will initate gait training with RW PT Short Term Goal 3 - Progress (Week 1): Met Week 2:  PT Short Term Goal 1 (Week 2): pt will transfer bed<>chair with LRAD and supervision PT Short Term Goal 2 (Week 2): pt will transfer sit<>stand with LRAD and supervision PT Short Term Goal 3 (Week 2): pt will ambulate 35f with LRAD and supervision while adhering to TDWB precautions  Skilled Therapeutic Interventions/Progress Updates:   Treatment Session 1 Received pt semi-reclined in bed, reporting feeling sleepy.  Pt agreeable to PT treatment and reported pain 4/10 in L hip with mobility. Session with emphasis on functional mobility/transfers, dressing, and dynamic standing balance/coordination. Donned ted hose in supine with total A for BP management and pt transferred semi-reclined<>sitting EOB with HOB elevated and use of bedrails with supervision and increased time/effort. Donned pants, R shoe, and L non-slip sock sitting EOB with max A for time management purposes. Pt stood with RW and CGA and required total A to pull pants over hips due to posterior bias - cues to bring hips towards walker for improved anterior weight shifting. Pt transferred bed<>WC stand<>pivot with RW and CGA, demonstrating good adherence to LLE TDWB precautions, and changed shirts with supervision. Donned abdominal binder with total A and pt sat in WC at sink and brushed teeth/washed face  with set up assist. Stood again with RW and CGA to adjust clothing and concluded session with pt sitting in WC, needs within reach, and chair pad alarm on. NT present at bedside changing bed linens.   Treatment Session 2 Received pt sitting in WC in new room. Pt agreeable to PT treatment and did not c/o pain during session. Session with emphasis on functional mobility/transfers, generalized strengthening and endurance, dynamic standing balance/coordination, and gait training. [facility]. [facility] and ambulated 358fwith RW and CGA to Nustep - although pt appeared to be placing weight through LLE. Pt performed BUE and RLE strengthening on Nustep at workload 4 for 8 minutes for a total of 211 steps with emphasis on cardiovascular endurance. Stood from [facility] RW and CGA - cues to push up from armrests and ambulated [id number] RW and CGA to mat - noted very poor adherence to TDWB precautions and reported arms were fatigued from previous OT session. Pt stood from EOM with RW and CGA x 2 additional trials and performed the following exercises with emphasis on LE strength/ROM: -standing L hip abduction 2x8 -standing L hip flexion 2x8 -seated LAQ 2x10 bilaterally with 2lb ankle weight  -hip flexion 2x10 bilaterally with 2lb ankle weight on RLE  -hip adduction ball squeezes 2x12 with 3 second isometric hold Pt transferred mat<>WC stand<>pivot with RW and CGA and returned to room. Pt requested to return to bed and transferred WC<>bed stand<>pivot with RW and CGA. Doffed abdominal  binder, R shoe, and ted hose and pt transferred sit<>supine with supervision. Concluded session with pt semi-reclined in bed, needs within reach, and bed alarm on.   Therapy Documentation Precautions:  Precautions Precautions: Fall Restrictions Weight Bearing Restrictions: Yes LLE Weight Bearing: Touchdown weight bearing  Therapy/Group:  Individual Therapy Alfonse Alpers PT, DPT  04/14/2022, 7:02 AM

## 2022-04-14 NOTE — Progress Notes (Signed)
PROGRESS NOTE   Subjective/Complaints: No new complaints this morning, feeling well Discussed that Bgs up to 326 but no more lows, will increase semglee to 16U, discussed with diabetes coordinator as well   ROS:  Pt denies SOB, abd pain, CP, N/V/C/D, and vision changes, +fatigue, +orthostasis, +shoulder pain, +lethargy this morning as per therapy, +groin pain- improved  Objective:   No results found. Recent Labs    04/13/22 0619  WBC 4.6  HGB 9.8*  HCT 29.0*  PLT 353     Recent Labs    04/13/22 0619 04/13/22 0949  NA 134*  --   K 4.0  --   CL 100  --   CO2 28  --   GLUCOSE 267* 89  BUN 20  --   CREATININE 0.81  --   CALCIUM 8.4*  --       Intake/Output Summary (Last 24 hours) at 04/14/2022 1118 Last data filed at 04/14/2022 1610 Gross per 24 hour  Intake 777 ml  Output --  Net 777 ml        Physical Exam: Vital Signs Blood pressure 126/65, pulse 99, temperature 98.9 F (37.2 C), resp. rate 18, height '5\' 2"'$  (1.575 m), weight 50.2 kg, SpO2 94 %. 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 Functional deficits which require 3+ hours per day of interdisciplinary therapy in a comprehensive inpatient rehab setting. Physiatrist is providing close team supervision and 24 hour management of active medical problems listed below. Physiatrist and rehab team continue to assess barriers to discharge/monitor patient progress toward functional and medical goals  Care Tool:  Bathing    Body parts bathed by patient: Right arm, Left arm, Chest, Abdomen, Front perineal area, Buttocks, Right upper leg, Left upper leg, Face, Right lower leg, Left lower leg   Body parts bathed by helper: Front perineal area, Buttocks, Right lower leg, Left lower leg     Bathing assist Assist Level: Contact Guard/Touching assist     Upper Body Dressing/Undressing Upper body dressing   What is the patient wearing?: Pull over shirt    Upper body assist Assist Level: Supervision/Verbal cueing    Lower Body Dressing/Undressing Lower body dressing      What is the patient wearing?: Incontinence brief, Pants     Lower body assist Assist for lower body dressing: Minimal  Assistance - Patient > 75%     Chartered loss adjuster assist Assist for toileting: Minimal Assistance - Patient > 75%     Transfers Chair/bed transfer  Transfers assist     Chair/bed transfer assist level: Contact Guard/Touching assist     Locomotion Ambulation   Ambulation assist   Ambulation activity did not occur: Safety/medical concerns (fatigue, poor adherance to TDWB precautions)  Assist level: Contact Guard/Touching assist Assistive device: Walker-rolling Max distance: 27 ft   Walk 10 feet activity   Assist  Walk 10 feet activity did not occur: Safety/medical concerns (fatigue, poor adherance to TDWB precautions)  Assist level: Contact Guard/Touching assist Assistive device: Walker-rolling   Walk 50 feet  activity   Assist Walk 50 feet with 2 turns activity did not occur: Safety/medical concerns (fatigue, poor adherance to TDWB precautions)         Walk 150 feet activity   Assist Walk 150 feet activity did not occur: Safety/medical concerns (fatigue, poor adherance to TDWB precautions)         Walk 10 feet on uneven surface  activity   Assist Walk 10 feet on uneven surfaces activity did not occur: Safety/medical concerns (fatigue, poor adherance to TDWB precautions)         Wheelchair     Assist Is the patient using a wheelchair?: Yes Type of Wheelchair: Manual    Wheelchair assist level: Supervision/Verbal cueing Max wheelchair distance: 155f    Wheelchair 50 feet with 2 turns activity    Assist        Assist Level: Supervision/Verbal cueing   Wheelchair 150 feet activity     Assist      Assist Level: Supervision/Verbal cueing   Blood pressure 126/65, pulse 99, temperature 98.9 F (37.2 C), resp. rate 18, height '5\' 2"'$  (1.575 m), weight 50.2 kg, SpO2 94 %.  Medical Problem List and Plan: 1. Functional deficits secondary to Left femoral neck fracture s/p percutaneous fixation on 03/29/22             -patient may shower, please cover incision             -ELOS/Goals: 12-14 days , PT/OT supervision to mod I            Continue CIR- PT and OT-   Vitamin D supplement started 2.  Impaired mobility: UKoreareviewed and is negative for clots. Continue Lovenox             -antiplatelet therapy: ASA '81mg'$  3. Pain from hip fracture: decrease Norco to '5mg'$  q8H prn due to hypotension. Scheduled tylenol increased to 1,'000mg'$  TID.  4. Daytime somnolence: decrease melatonin to '3mg'$  HS 5. Neuropsych/cognition: This patient is capable of making decisions on her own behalf. 6. Skin/Wound Care: Routine pressure relief measures.  7. Fluids/Electrolytes/Nutrition: Monitor I/O. Check CMET in am.  8. Left subcapital femur Fx s/p fixation: TDWB per Dr. HEarnestine Leys9.T1DM with prior episodes of hypoglycemia: continue omnipod and dexcom --will consult diabetic coordinator to assist with management.   1/7: hypoglycemic to 30: decrease Novolog to 2U TID and increase semglee to 16U daily.              --Followed by Dr TMee Hives 10. Orthostatic hypotension: abdominal binder ordered. Hydrocodone dose lowered and frequency decreased to qH prn --Also reports of diplopia per optho notes.  - Continue Midodrine for BP support. Compression garments ordered for during therapy.  11. CAD/Blood pressure: Denies chest pain 12. Depression/anxiety?:  Continue Zoloft and Bupropion.  13. Acute blood loss anemia: Stable around 9.0. Recheck CBC in am.              -Continue iron supplementation 14. Hypokalemia: Was supplemented X 1 today. K+ 3.4 12/28. Recheck in am.   12/30- K+ 3.5- will recheck Monday 15. Constipation: changed senna to HS  12/30- LBM 2 days ago- is normal for her to go q2-3 days.  16. Fatigue: checked B12 level and is normal 17. Suboptimal magnesium: supplemented 2grams IV on 12/28, 1gm ordered 1/3. 18. Radiation induced nausea: discussed hyperbaric oxygen therapy outpatient. Scheduled compazine prior to breakfast. Resolved.  19. Suboptimal vitamin D level: ergocalciferol 50,000U ordered q7 days 20. Tachycardia: EKG ordered and shows sinus tachycardia, discussed with patient.  21. Lethargy: CBC and BMP ordered but appears to have resolved   LOS: 12 days A FACE TO [place] 04/14/2022, 11:18 AM

## 2022-04-15 LAB — BASIC METABOLIC PANEL
Anion gap: 10 (ref 5–15)
BUN: 19 mg/dL (ref 8–23)
CO2: 28 mmol/L (ref 22–32)
Calcium: 8.7 mg/dL — ABNORMAL LOW (ref 8.9–10.3)
Chloride: 98 mmol/L (ref 98–111)
Creatinine, Ser: 0.95 mg/dL (ref 0.44–1.00)
GFR, Estimated: >60 mL/min (ref >60)
Glucose, Bld: 223 mg/dL — ABNORMAL HIGH (ref 70–99)
Potassium: 3.6 mmol/L (ref 3.5–5.1)
Sodium: 136 mmol/L (ref 135–145)

## 2022-04-15 LAB — GLUCOSE, CAPILLARY
Glucose-Capillary: 79 mg/dL (ref 70–99)
Glucose-Capillary: 174 mg/dL — ABNORMAL HIGH (ref 70–99)
Glucose-Capillary: 321 mg/dL — ABNORMAL HIGH (ref 70–99)
Glucose-Capillary: 410 mg/dL — ABNORMAL HIGH (ref 70–99)
Glucose-Capillary: 331 mg/dL — ABNORMAL HIGH (ref 70–99)
Glucose-Capillary: 289 mg/dL — ABNORMAL HIGH (ref 70–99)
Glucose-Capillary: 117 mg/dL — ABNORMAL HIGH (ref 70–99)

## 2022-04-15 MED ORDER — SORBITOL 70 % SOLN
30.0000 mL | Freq: Every day | Status: DC | PRN
  Administered 2022-04-15: 30 mL via ORAL
  Filled 2022-04-15: qty 30

## 2022-04-15 MED ORDER — ACETAMINOPHEN 500 MG PO TABS: 1000.0000 mg | ORAL_TABLET | Freq: Three times a day (TID) | ORAL | 0 refills | Status: DC

## 2022-04-15 MED ORDER — SODIUM CHLORIDE (PF) 0.9 % IJ SOLN
INTRAMUSCULAR | Status: AC
  Filled 2022-04-15: qty 20

## 2022-04-15 MED ORDER — SENNOSIDES-DOCUSATE SODIUM 8.6-50 MG PO TABS: 2.0000 | ORAL_TABLET | Freq: Every day | ORAL | Status: DC

## 2022-04-15 MED ORDER — MENTHOL 3 MG MT LOZG: 1.0000 | LOZENGE | OROMUCOSAL | 12 refills | Status: AC | PRN

## 2022-04-15 MED ORDER — HYDROCODONE-ACETAMINOPHEN 5-325 MG PO TABS
1.0000 | ORAL_TABLET | Freq: Two times a day (BID) | ORAL | Status: DC | PRN
  Administered 2022-04-15 – 2022-04-17 (×2): 1 via ORAL
  Filled 2022-04-15 (×3): qty 1

## 2022-04-15 NOTE — Progress Notes (Signed)
Patient Blood sugar was 410 at 4:30PM, called PA and she ordered 2 full glasses of water and re-check before dinner.

## 2022-04-15 NOTE — Plan of Care (Signed)
  Problem: RH Balance Goal: LTG Patient will maintain dynamic standing with ADLs (OT) Description: LTG:  Patient will maintain dynamic standing balance with assist during activities of daily living (OT)  Flowsheets (Taken 04/15/2022 1551) LTG: Pt will maintain dynamic standing balance during ADLs with: (downgraded due to slower than ancitipated progress, balance and endurance deficits with poor TTWB precaution adherence on LLE) Supervision/Verbal cueing   Problem: Sit to Stand Goal: LTG:  Patient will perform sit to stand in prep for activites of daily living with assistance level (OT) Description: LTG:  Patient will perform sit to stand in prep for activites of daily living with assistance level (OT) Flowsheets (Taken 04/15/2022 1551) LTG: PT will perform sit to stand in prep for activites of daily living with assistance level: (downgraded due to slower than ancitipated progress, balance and endurance deficits with poor TTWB precaution adherence on LLE) Supervision/Verbal cueing   Problem: RH Dressing Goal: LTG Patient will perform upper body dressing (OT) Description: LTG Patient will perform upper body dressing with assist, with/without cues (OT). Flowsheets (Taken 04/15/2022 1551) LTG: Pt will perform upper body dressing with assistance level of: (downgraded due to slower than ancitipated progress, balance and endurance deficits with poor TTWB precaution adherence on LLE) Set up assist Goal: LTG Patient will perform lower body dressing w/assist (OT) Description: LTG: Patient will perform lower body dressing with assist, with/without cues in positioning using equipment (OT) Flowsheets (Taken 04/15/2022 1551) LTG: Pt will perform lower body dressing with assistance level of: (downgraded due to slower than ancitipated progress, balance and endurance deficits with poor TTWB precaution adherence on LLE) Supervision/Verbal cueing   Problem: RH Toileting Goal: LTG Patient will perform toileting task  (3/3 steps) with assistance level (OT) Description: LTG: Patient will perform toileting task (3/3 steps) with assistance level (OT)  Flowsheets (Taken 04/15/2022 1551) LTG: Pt will perform toileting task (3/3 steps) with assistance level: (downgraded due to slower than ancitipated progress, balance and endurance deficits with poor TTWB precaution adherence on LLE) Contact Guard/Touching assist   Problem: RH Simple Meal Prep Goal: LTG Patient will perform simple meal prep w/assist (OT) Description: LTG: Patient will perform simple meal prep with assistance, with/without cues (OT). Flowsheets Taken 04/15/2022 1551 LTG: Pt will perform simple meal prep with assistance level of: Independent with assistive device Taken 04/03/2022 1216 LTG: Pt will perform simple meal prep w/level of: Wheelchair level   Problem: RH Toilet Transfers Goal: LTG Patient will perform toilet transfers w/assist (OT) Description: LTG: Patient will perform toilet transfers with assist, with/without cues using equipment (OT) Flowsheets (Taken 04/15/2022 1551) LTG: Pt will perform toilet transfers with assistance level of: (downgraded due to slower than ancitipated progress, balance and endurance deficits with poor TTWB precaution adherence on LLE) Supervision/Verbal cueing   Problem: RH Tub/Shower Transfers Goal: LTG Patient will perform tub/shower transfers w/assist (OT) Description: LTG: Patient will perform tub/shower transfers with assist, with/without cues using equipment (OT) Flowsheets (Taken 04/15/2022 1551) LTG: Pt will perform tub/shower stall transfers with assistance level of: (downgraded due to slower than ancitipated progress, balance and endurance deficits with poor TTWB precaution adherence on LLE) Contact Guard/Touching assist

## 2022-04-15 NOTE — Progress Notes (Signed)
Patient ID: Diana Frost, female   DOB: 04/07/1945, 77 y.o.   MRN: [id number] Team Conference Report to Patient/Family  Team Conference discussion was reviewed with the patient and caregiver, including goals, any changes in plan of care and target discharge date.  Patient and caregiver express understanding and are in agreement.  The patient has a target discharge date of 04/23/22.  SW spoke with patient daughter, [name]. Daughter concerned about the pt's care needs at home and concerned she may not be able to manage. Daughter invited to complete education on 1/15 9-12. Sw and daughter discussed [facility] and SNF options. Daughter would like the patient home safely. Daughter will complete education and then sw will discuss options. No additional questions or concerns.  [name] 04/15/2022, 1:53 PM

## 2022-04-15 NOTE — Inpatient Diabetes Management (Signed)
Inpatient Diabetes Program Recommendations  AACE/ADA: New Consensus Statement on Inpatient Glycemic Control (2015)  Target Ranges:  Prepandial:   less than 140 mg/dL      Peak postprandial:   less than 180 mg/dL (1-2 hours)      Critically ill patients:  140 - 180 mg/dL   Lab Results  Component Value Date   GLUCAP 321 (H) 04/15/2022   HGBA1C 7.2 (H) 04/01/2022    Checked on Diana Frost this afternoon.  She states her Dexcom is not working.  Asked to see her phone and the Dexcom is working and currently reading 309 mg/dL.  She just finished lunch.  The Dexcom is not sharing with daughter [name] for some reason.  Looked at her settings and it is set for sharing with [name].  Unsure why she's not getting her mothers glucose trends.    Will continue to follow while inpatient.  Thank you, [name], MSN, [place] Diabetes Coordinator Inpatient Diabetes Program [phone] (team pager from 8a-5p)

## 2022-04-15 NOTE — Plan of Care (Signed)
  Problem: Sit to Stand Goal: LTG:  Patient will perform sit to stand with assistance level (PT) Description: LTG:  Patient will perform sit to stand with assistance level (PT) Flowsheets (Taken 04/15/2022 0712) LTG: PT will perform sit to stand in preparation for functional mobility with assistance level: (downgraded due to decreased balance/coordination and weakness/deconditioning) Supervision/Verbal cueing Note: downgraded due to decreased balance/coordination and weakness/deconditioning    Problem: RH Bed to Chair Transfers Goal: LTG Patient will perform bed/chair transfers w/assist (PT) Description: LTG: Patient will perform bed to chair transfers with assistance (PT). Flowsheets (Taken 04/15/2022 0712) LTG: Pt will perform Bed to Chair Transfers with assistance level: (downgraded due to decreased balance/coordination and weakness/deconditioning) Supervision/Verbal cueing Note: downgraded due to decreased balance/coordination and weakness/deconditioning

## 2022-04-15 NOTE — NC FL2 (Signed)
[place] LEVEL OF CARE FORM     IDENTIFICATION  Patient Name: Diana Frost Birthdate: 03-09-1946 Sex: female Admission Date (Current Location): 04/02/2022  [facility] and [place] Number:  Herbalist and Address:  The [place], [place] [address] [place],  [address]      Provider Number:    Attending Physician Name and Address:  Izora Ribas, MD  Relative Name and Phone Number:  [name] [phone]    Current Level of Care: Hospital Recommended Level of Care: [place] Prior Approval Number:    Date Approved/Denied:   PASRR Number: [id number]  Discharge Plan: SNF    Current Diagnoses: Patient Active Problem List   Diagnosis Date Noted   Femur fracture, left ([place]) 04/02/2022   Normocytic anemia 03/29/2022   Left displaced femoral neck fracture ([place]) 03/29/2022   UTI (urinary tract infection) 10/14/2021   Syncope 10/11/2021   Sinus tachycardia 10/11/2021   Vomiting and diarrhea 10/11/2021   Closed displaced fracture of left femoral neck ([place]) 01/07/2019   Malnutrition of moderate degree 08/14/2016   Non-ST elevation (NSTEMI) myocardial infarction ([place]) 08/13/2016   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   AKI (acute kidney injury) ([place]) 08/13/2016   Hypokalemia 08/13/2016   DKA (diabetic ketoacidoses) 08/12/2016   Melanoma ([place]) 11/01/2015   Stable angina 10/10/2015   Coronary artery disease involving native coronary artery of native heart 11/16/2012   History of insertion of insulin pump 05/24/2012   Type 1 diabetes mellitus ([place]) 02/11/2011   Depression 02/11/2011    Orientation RESPIRATION BLADDER Height & Weight     Self, Time, Situation  Normal Continent Weight: 110 lb 10.7 oz (50.2 kg) Height:  '5\' 2"'$  (157.5 cm)  BEHAVIORAL SYMPTOMS/MOOD NEUROLOGICAL BOWEL NUTRITION STATUS      Continent Diet  AMBULATORY STATUS COMMUNICATION OF NEEDS Skin   Limited Assist Verbally  Normal                       Personal Care Assistance Level of Assistance  Bathing, Feeding, Dressing Bathing Assistance: Limited assistance Feeding assistance: Limited assistance Dressing Assistance: Limited assistance     Functional Limitations Info             [place]  PT (By licensed PT), OT (By licensed OT), Speech therapy, Bowel and bladder program     PT Frequency: 5x per week OT Frequency: 5x per week     Speech Therapy Frequency: 5x per week      Contractures Contractures Info: Not present    Additional Factors Info  Code Status Code Status Info: FULL             Current Medications (04/15/2022):  This is the current hospital active medication list Current Facility-Administered Medications  Medication Dose Route Frequency Provider Last Rate Last Admin   acetaminophen (TYLENOL) tablet 1,000 mg  1,000 mg Oral TID Izora Ribas, MD   1,000 mg at 04/15/22 0757   alum & mag hydroxide-simeth (MAALOX/MYLANTA) 200-200-20 MG/5ML suspension 30 mL  30 mL Oral Q4H PRN Love, Pamela S, PA-C       aspirin EC tablet 81 mg  81 mg Oral Daily Bary Leriche, PA-C   81 mg at 04/15/22 0755   buPROPion (WELLBUTRIN XL) 24 hr tablet 150 mg  150 mg Oral q morning Bary Leriche, PA-C   150 mg at 04/15/22 0756   docusate sodium (COLACE) capsule 200  mg  200 mg Oral Daily Bary Leriche, PA-C   200 mg at 04/15/22 0756   enoxaparin (LOVENOX) injection 40 mg  40 mg Subcutaneous Q24H Love, Pamela S, PA-C   40 mg at 04/15/22 0755   guaiFENesin-dextromethorphan (ROBITUSSIN DM) 100-10 MG/5ML syrup 5-10 mL  5-10 mL Oral Q6H PRN Love, Pamela S, PA-C       HYDROcodone-acetaminophen (NORCO/VICODIN) 5-325 MG per tablet 1 tablet  1 tablet Oral BID PRN Izora Ribas, MD   1 tablet at 04/15/22 1108   insulin aspart (novoLOG) injection 0-5 Units  0-5 Units Subcutaneous QHS Bary Leriche, PA-C   2 Units at 04/14/22 2105   insulin aspart (novoLOG) injection 0-6  Units  0-6 Units Subcutaneous BID WC Bary Leriche, PA-C   1 Units at 04/15/22 2595   insulin aspart (novoLOG) injection 2 Units  2 Units Subcutaneous BID WC Bary Leriche, PA-C   2 Units at 04/15/22 0815   insulin aspart (novoLOG) injection 2 Units  2 Units Subcutaneous QPC lunch Bary Leriche, [place]   2 Units at 04/15/22 1404   insulin glargine-yfgn (SEMGLEE) injection 16 Units  16 Units Subcutaneous Daily Raulkar, Clide Deutscher, MD   16 Units at 04/15/22 0756   magnesium hydroxide (MILK OF MAGNESIA) suspension 30 mL  30 mL Oral Daily PRN Love, Pamela S, PA-C       melatonin tablet 3 mg  3 mg Oral QHS PRN Raulkar, Clide Deutscher, MD   3 mg at 04/10/22 2114   menthol-cetylpyridinium (CEPACOL) lozenge 3 mg  1 lozenge Oral PRN Love, Pamela S, PA-C       Or   phenol (CHLORASEPTIC) mouth spray 1 spray  1 spray Mouth/Throat PRN Love, Pamela S, PA-C       midodrine (PROAMATINE) tablet 5 mg  5 mg Oral TID WC Love, Pamela S, PA-C   5 mg at 04/15/22 1405   polyethylene glycol (MIRALAX / GLYCOLAX) packet 17 g  17 g Oral Daily PRN Bary Leriche, PA-C   17 g at 04/14/22 2105   prochlorperazine (COMPAZINE) injection 10 mg  10 mg Intravenous QAC breakfast Raulkar, Clide Deutscher, MD   10 mg at 04/14/22 6387   prochlorperazine (COMPAZINE) tablet 5-10 mg  5-10 mg Oral Q6H PRN Bary Leriche, PA-C   10 mg at 04/15/22 5643   Or   prochlorperazine (COMPAZINE) injection 5-10 mg  5-10 mg Intramuscular Q6H PRN Love, Pamela S, PA-C       Or   prochlorperazine (COMPAZINE) suppository 12.5 mg  12.5 mg Rectal Q6H PRN Love, Pamela S, PA-C       rosuvastatin (CRESTOR) tablet 10 mg  10 mg Oral QHS Love, Pamela S, PA-C   10 mg at 04/14/22 2105   senna-docusate (Senokot-S) tablet 2 tablet  2 tablet Oral QHS Love, Pamela S, PA-C   2 tablet at 04/14/22 2105   sertraline (ZOLOFT) tablet 100 mg  100 mg Oral Daily Bary Leriche, PA-C   100 mg at 04/15/22 0756   sodium chloride (PF) 0.9 % injection            sodium phosphate (FLEET) 7-19  GM/118ML enema 1 enema  1 enema Rectal Once PRN Love, Pamela S, PA-C       sorbitol 70 % solution 30 mL  30 mL Oral Daily PRN Angiulli, Lavon Paganini, PA-C       Vitamin D (Ergocalciferol) (DRISDOL) 1.25 MG (50000 UNIT) capsule 50,000 Units  50,000 Units Oral Q7 days Izora Ribas, MD   50,000 Units at 04/14/22 1231     Discharge Medications: Please see discharge summary for a list of discharge medications.  Relevant Imaging Results:  Relevant Lab Results:   Additional Information [id number]  [name]

## 2022-04-15 NOTE — Progress Notes (Signed)
Physical Therapy Weekly Progress Note  Patient Details  Name: Diana Frost MRN: [id number] Date of Birth: 02-16-1946  Beginning of progress report period: April 03, 2022 End of progress report period: April 15, 2021  Today's Date: 04/15/2022 PT Individual Time: 1400-1456 PT Individual Time Calculation (min): 56 min   Patient has met 0 of 3 short term goals. Pt demonstrates very slow progress towards long term goals. Pt is currently able to perform bed mobility with supervision, transfers with RW and CGA (occasionally min A due to posterior LOB), WC mobility 171f using BUE and supervision, and ambulate ~169fwith RW and CGA. Pt continues to be limited by drops in BP resulting in lightheadedness/dizziness, difficulty adhering to LLE TDWB precautions, generalized weakness/deconditioning, and decreased balance strategies. Pt's daughter has been present briefly but has not participated in hands on family education training - plan to initiate family education end of this week/early next week.   Patient continues to demonstrate the following deficits muscle weakness, decreased cardiorespiratoy endurance, decreased problem solving and decreased memory, and decreased standing balance, decreased postural control, decreased balance strategies, and difficulty maintaining precautions and therefore will continue to benefit from skilled PT intervention to increase functional independence with mobility.  Patient progressing toward long term goals..  Continue plan of care.  PT Short Term Goals Week 2:  PT Short Term Goal 1 (Week 2): pt will transfer bed<>chair with LRAD and supervision PT Short Term Goal 1 - Progress (Week 2): Progressing toward goal PT Short Term Goal 2 (Week 2): pt will transfer sit<>stand with LRAD and supervision PT Short Term Goal 2 - Progress (Week 2): Progressing toward goal PT Short Term Goal 3 (Week 2): pt will ambulate 1019fith LRAD and supervision while adhering to TDWB  precautions PT Short Term Goal 3 - Progress (Week 2): Progressing toward goal Week 3:  PT Short Term Goal 1 (Week 3): STG=LTG due to LOS  Skilled Therapeutic Interventions/Progress Updates:  Ambulation/gait training;Community reintegration;DME/adaptive equipment instruction;Neuromuscular re-education;Psychosocial support;Stair training;UE/LE Strength taining/ROM;Wheelchair propulsion/positioning;Balance/vestibular training;Discharge planning;Pain management;Skin care/wound management;Therapeutic Activities;UE/LE Coordination activities;Cognitive remediation/compensation;Disease management/prevention;Functional mobility training;Patient/family education;Therapeutic Exercise;Visual/perceptual remediation/compensation   Today's Interventions: Received pt sitting in WC with RN administering medications. Pt agreeable to PT treatment and reported pain in L hip with mobility, but not at rest. Session with emphasis on functional mobility/transfers, dynamic standing balance/coordination, adherence to LLE TDWB precautions, and gait training. [facility]. Pt stood with RW x 2 trials and CGA with cues to push up on WC armrests and for adherence to LLE TDWB precautions. Orthostatics assessed below: BP sitting without abdominal binder: 110/67 - pt asymptomatic  BP standing without abdominal binder: 81/56 - pt symptomatic BP standing with abdominal binder on: 86/58 - pt asymptomatic  Pt then ambulated 79f17fth RW and CGA with therapist's foot placed under pt's LLE to determine WC adherence and recreational therapist providing close WC [place]. Cued pt to advance RW then "boost" up on arms when stepping R foot forward to take pressure off L foot - also provided tactile cueing at L foot to assist. Pt successfully able to take a few steps with TDWB, but then began placing increased weight through LLE. BP: 151/70 seated after ambulating - reassessed: 147/74 and  pt asymptomatic. Pt stood with RW and CGA x2 additional trials and worked on dynamic standing balance re-creating pattern with clothespins using RUE with CGA for balance with therapist's foot placed under pt's LLE - pt able to  remain standing for 2 minutes both times prior to sitting and demonstrating good adherence to WB precautions. Returned to room and concluded session with pt sitting in WC, needs within reach, and chair pad alarm on. NT of pt's request to change bed linens and return to bed.   Therapy Documentation Precautions:  Precautions Precautions: Fall Restrictions Weight Bearing Restrictions: Yes LLE Weight Bearing: Touchdown weight bearing   Therapy/Group: Individual Therapy Alfonse Alpers PT, DPT  04/15/2022, 7:06 AM

## 2022-04-15 NOTE — Patient Care Conference (Signed)
Inpatient RehabilitationTeam Conference and Plan of Care Update Date: 04/15/2022   Time: 11:35 AM    Patient Name: Diana Frost      Medical Record Number: [id number]  Date of Birth: [birth year] Sex: Female         Room/Bed: 4M06C/4M06C-01 Payor Info: Payor: MEDICARE / Plan: MEDICARE PART A AND B / Product Type: *No Product type* /    Admit Date/Time:  04/02/2022  2:27 PM  Primary Diagnosis:  Femur fracture, left [facility])  Hospital Problems: Principal Problem:   Femur fracture, left [facility])    Expected Discharge Date: Expected Discharge Date: 04/23/22  Team Members Present: Physician leading conference: Dr. Leeroy Cha Social Worker Present: Erlene Quan, BSW Nurse Present: Dorien Chihuahua, RN PT Present: Becky Sax, PT OT Present: Jennefer Bravo, OT SLP Present: Helaine Chess, SLP PPS Coordinator present : Gunnar Fusi, SLP     Current Status/Progress Goal Weekly Team Focus  Bowel/Bladder   Continent of bowel, con/incon of bladder   Regain full bladder continence   Assit with toileting as needed    Swallow/Nutrition/ Hydration   Not addressing   N/A  N/A    ADL's   Supervision UB, Min A LB, mod A toileting   Mod I, anticipate needing to downgrade due to supervision for cog reasons   activity tolerance, functional transfers, balance, family ed, IADL management    Mobility   bed mobility CGA, transfers with RW min A, gait 57f with RW and min A, supervision WC mobility 1549f Difficulty maintaining LLE TDWB precautions but does much better with shoe on R foot.   Mod I transfers, supervision bed mobility, gait, and WC mobility, CGA car transfer  functional mobility/transfers, generalized strengthening and endurance, dynamic standing balance/coordination, D/C planning, equipment management, family education, pain management, and adherance to LLE TDWB precautions.    Communication   Not addressing   N/A   N/A    Safety/Cognition/ Behavioral  Observations  Sup-Min A   Supervision-Min A   Functional problem-solving and recall with use of strategies    Pain      Pain with therapy; scheduled tylenol   < 4 with prns    Assess need for and effectiveness of medications.   Skin   Left hip surgical incision.   Maintain skin integrity  Assess Qshift and prn      Discharge Planning:  Discharging home with assistance from daughter and hired caregiver. Spouse is bedbound.   Team Discussion: Patient expresses pain with therapy but only taking scheduled tylenol. Noted increased energy but inability to maintain weight bearing precautions.  Patient on target to meet rehab goals: Currently needs CGA for transfers and able to ambulate up to 2740using a  RW with min assist.  Progress limited by fatigue, weakness and low blood pressure.  Needs supervision to complete upper body care and min assist for lower body care with mi - mod assist for toileting.  Supervision - min assist for cognition.   *See Care Plan and progress notes for long and short-term goals.   Revisions to Treatment Plan:  Downgraded goals to supervision Timed toileting protocol activated   Teaching Needs: Safety, medications, weight bearing precautions, transfers, toileting, etc.  Current Barriers to Discharge: Decreased caregiver support, Home enviroment access/layout, and Weight bearing restrictions  Possible Resolutions to Barriers: Family education      Medical Summary Current Status: impaired cognition, hypotension, weakness, type 1 diabetes with hypo and hyperglycemia  Barriers to Discharge: Morbid Obesity;Medical stability;Uncontrolled Diabetes;Uncontrolled  Pain;Weight bearing restrictions  Barriers to Discharge Comments: impaired cognition, hypotension, weakness, type 1 diabetes with hypo and hyperglycemia Possible Resolutions to [employer] Focus: switched from omnipod to insulinm decrease Novolog to 2U, increase semglee to 16U, added 2U prn  Novolog with lunch if CBGs >250   Continued Need for Acute Rehabilitation Level of Care: The patient requires daily medical management by a physician with specialized training in physical medicine and rehabilitation for the following reasons: Direction of a multidisciplinary physical rehabilitation program to maximize functional independence : Yes Medical management of patient stability for increased activity during participation in an intensive rehabilitation regime.: Yes Analysis of laboratory values and/or radiology reports with any subsequent need for medication adjustment and/or medical intervention. : Yes   I attest that I was present, lead the team conference, and concur with the assessment and plan of the team.   Dorien Chihuahua B 04/15/2022, 2:05 PM

## 2022-04-15 NOTE — Progress Notes (Signed)
Patient ID: Diana Frost, female   DOB: September 09, 1945, 77 y.o.   MRN: [id number]  [facility] and SNF Resources sent to patient daughter at [email or web address].

## 2022-04-15 NOTE — Progress Notes (Signed)
Speech Language Pathology Weekly Progress and Session Note  Patient Details  Name: Diana Frost MRN: [id number] Date of Birth: 07-01-1945  Beginning of progress report period: April 08, 2022 End of progress report period: April 15, 2022  Today's Date: 04/15/2022 SLP Individual Time: 1252-1350 SLP Individual Time Calculation (min): 58 min  Short Term Goals: Week 1: SLP Short Term Goal 1 (Week 1): Patient will demonstrate functional problem solving for mildly complex tasks with supervision verbal cues. SLP Short Term Goal 1 - Progress (Week 1): Not met SLP Short Term Goal 2 (Week 1): Patient will recall new, daily information with Min cues for use of memory compensatory strategies. SLP Short Term Goal 2 - Progress (Week 1): Met    New Short Term Goals: Week 2: SLP Short Term Goal 1 (Week 2): STG's = LTG's due to ELOS  Weekly Progress Updates: This past reporting period, pt has demonstrated slow progress as evident by meeting 1 out of 2 short-term goals outlined above. Pt and family education ongoing. Currently, pt is functioning at a Sup A to Mod I for functional recall and Sup to Min A for basic problem-solving; at goal level. Will downgrade problem-solving goal to basic vs complex problem-solving given pt was not completing high-level IADL's prior to admission. Suspect pt is nearing her cognitive baseline; will reach out to family to confirm; therefore, frequency has been reduced to 1-3x vs 3-5x a week..Anticipate pt will continue to need 24/7 supervision and assistance at time of discharge, though do not anticipate need for ST intervention, though will continue to monitor.   Intensity: Minumum of 1-2 x/day, 30 to 90 minutes Frequency: 1 to 3 out of 7 days Duration/Length of Stay: 10-12 days Treatment/Interventions: Cognitive remediation/compensation;Internal/external aids;Therapeutic Activities;Environmental controls;Cueing hierarchy;Functional tasks;Patient/family  education   Daily Session  Skilled Therapeutic Interventions:     Pt seen this date for skilled ST intervention targeting cognitive goals outlined in care plan. Pt received awake/alert and OOB in w/c, talking with Diabetes Coordinator. Agreeable to intervention in hospital room.  Today's session with emphasis on functional cognitive skill training. Pt demonstrated functional recall with Sup A to Mod I. Sup A for verbal problem-solving of hypothetical safety scenarios.To further inform ST POC, SLP re-administered SLUMS with pt achieving a score of 23 out of 30, which is an improvement from initial evaluation score on 04/07/2022 of 18/30. Pt reports she feels she has improved since initial evaluation, though cannot deny nor endorse if cognition is c/w bsaeline. Will speak with pt's daughter next session to further determine cognition now vs during initial evaluation. Results communicated with pt who verbalized understanding.  Pt left in room with all safety measures activated and call bell within reach. Continue per current ST POC.  Pain No pain reported in the absence of movement; NAD  Therapy/Group: Individual Therapy  Simeon Vera A Danise Dehne 04/15/2022, 9:53 PM

## 2022-04-15 NOTE — Progress Notes (Signed)
PROGRESS NOTE   Subjective/Complaints: No new complaints this morning Feels ready for a daytime nap Discussed improvements in CBGs Will decrease norco frequency   ROS:  Pt denies SOB, abd pain, CP, N/V/C/D, and vision changes, +fatigue, +orthostasis, +shoulder pain, +lethargy this morning as per therapy, +groin pain- improved  Objective:   No results found. Recent Labs    04/13/22 0619  WBC 4.6  HGB 9.8*  HCT 29.0*  PLT 353     Recent Labs    04/13/22 0619 04/13/22 0949  NA 134*  --   K 4.0  --   CL 100  --   CO2 28  --   GLUCOSE 267* 89  BUN 20  --   CREATININE 0.81  --   CALCIUM 8.4*  --       Intake/Output Summary (Last 24 hours) at 04/15/2022 1004 Last data filed at 04/15/2022 0725 Gross per 24 hour  Intake 890 ml  Output 100 ml  Net 790 ml        Physical Exam: Vital Signs Blood pressure (!) 109/58, pulse 96, temperature 98.2 F (36.8 C), resp. rate 16, height '5\' 2"'$  (1.575 m), weight 50.2 kg, SpO2 97 %. 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 Functional deficits which require 3+ hours per day of interdisciplinary therapy in a comprehensive inpatient rehab setting. Physiatrist is providing close team supervision and 24 hour management of active medical problems listed below. Physiatrist and rehab team continue to assess barriers to discharge/monitor patient progress toward functional and medical goals  Care Tool:  Bathing    Body parts bathed by patient: Right arm, Left arm, Chest, Abdomen, Front perineal area, Buttocks, Right upper leg, Left upper leg, Face, Right lower leg, Left lower leg   Body parts bathed by helper: Front perineal area, Buttocks, Right lower leg, Left lower leg     Bathing assist Assist Level: Contact Guard/Touching assist     Upper Body Dressing/Undressing Upper body dressing   What is the patient wearing?: Pull over shirt    Upper body assist Assist Level: Supervision/Verbal cueing    Lower Body Dressing/Undressing Lower body dressing      What is the patient wearing?: Incontinence brief, Pants     Lower body assist Assist for lower body dressing: Minimal Assistance - Patient >  75%     Toileting Toileting    Toileting assist Assist for toileting: Minimal Assistance - Patient > 75%     Transfers Chair/bed transfer  Transfers assist     Chair/bed transfer assist level: Contact Guard/Touching assist     Locomotion Ambulation   Ambulation assist   Ambulation activity did not occur: Safety/medical concerns (fatigue, poor adherance to TDWB precautions)  Assist level: Contact Guard/Touching assist Assistive device: Walker-rolling Max distance: 27 ft   Walk 10 feet activity   Assist  Walk 10 feet activity did not occur: Safety/medical concerns (fatigue, poor adherance to TDWB precautions)  Assist level: Contact Guard/Touching assist Assistive device: Walker-rolling   Walk 50 feet  activity   Assist Walk 50 feet with 2 turns activity did not occur: Safety/medical concerns (fatigue, poor adherance to TDWB precautions)         Walk 150 feet activity   Assist Walk 150 feet activity did not occur: Safety/medical concerns (fatigue, poor adherance to TDWB precautions)         Walk 10 feet on uneven surface  activity   Assist Walk 10 feet on uneven surfaces activity did not occur: Safety/medical concerns (fatigue, poor adherance to TDWB precautions)         Wheelchair     Assist Is the patient using a wheelchair?: Yes Type of Wheelchair: Manual    Wheelchair assist level: Supervision/Verbal cueing Max wheelchair distance: 139f    Wheelchair 50 feet with 2 turns activity    Assist        Assist Level: Supervision/Verbal cueing   Wheelchair 150 feet activity     Assist      Assist Level: Supervision/Verbal cueing   Blood pressure (!) 109/58, pulse 96, temperature 98.2 F (36.8 C), resp. rate 16, height '5\' 2"'$  (1.575 m), weight 50.2 kg, SpO2 97 %.  Medical Problem List and Plan: 1. Functional deficits secondary to Left femoral neck fracture s/p percutaneous fixation on 03/29/22             -patient may shower, please cover incision             -ELOS/Goals: 12-14 days , PT/OT supervision to mod I            Continue CIR- PT and OT-   Vitamin D supplement started 2.  Impaired mobility: UKoreareviewed and is negative for clots. Continue Lovenox             -antiplatelet therapy: ASA '81mg'$  3. Pain from hip fracture: decrease Norco to '5mg'$  q12H prn due to hypotension. Scheduled tylenol increased to 1,'000mg'$  TID.  4. Daytime somnolence: decrease melatonin to '3mg'$  HS 5. Neuropsych/cognition: This patient is capable of making decisions on her own behalf. 6. Skin/Wound Care: Routine pressure relief measures.  7. Fluids/Electrolytes/Nutrition: Monitor I/O. Check CMET in am.  8. Left subcapital femur Fx s/p fixation: TDWB per Dr. HEarnestine Leys9.T1DM with prior episodes of hypoglycemia: continue omnipod and dexcom --will consult diabetic coordinator to assist with management.    decrease Novolog to 2U TID and increase semglee to 16U daily. Add 2U with lunc prn for CBG>250             --Followed by Dr TMee Hives 10. Orthostatic hypotension: abdominal binder ordered. Hydrocodone dose lowered and frequency decreased to qH prn --Also reports of diplopia per optho notes.  - Continue Midodrine for BP support. Compression garments ordered for during therapy.  11. CAD/Blood pressure: Denies chest pain 12. Depression/anxiety?:  Continue Zoloft and Bupropion.  13. Acute blood loss anemia: Stable around 9.0. Recheck CBC in am.              -Continue iron supplementation 14. Hypokalemia: Was supplemented X 1 today. K+ 3.4 12/28. Recheck in am.   12/30- K+ 3.5- will recheck Monday 15. Constipation: changed senna to HS  12/30- LBM 2 days ago- is normal for her to go q2-3 days.  16. Fatigue: checked B12 level and is normal 17. Suboptimal magnesium: supplemented 2grams IV on 12/28, 1gm ordered 1/3. 18. Radiation induced nausea: discussed hyperbaric oxygen therapy outpatient. Scheduled compazine prior to breakfast. Resolved.  19. Suboptimal vitamin D level: ergocalciferol 50,000U ordered q7 days 20. Tachycardia: EKG ordered and shows sinus tachycardia, discussed with patient.  21. Lethargy: CBC and BMP reviewed and stable   LOS: 13 days A FACE TO FACE EVALUATION WAS PERFORMED  Diana Frost 04/15/2022, 10:04 AM

## 2022-04-15 NOTE — Plan of Care (Signed)
  Problem: RH Problem Solving Goal: LTG Patient will demonstrate problem solving for (SLP) Description: LTG:  Patient will demonstrate problem solving for basic/complex daily situations with cues  (SLP) Flowsheets (Taken 04/15/2022 2155) LTG: Patient will demonstrate problem solving for (SLP): Basic daily situations LTG Patient will demonstrate problem solving for: Minimal Assistance - Patient > 75% Note: Downgraded due to slower than anticipated progress   Problem: RH Memory Goal: LTG Patient will use memory compensatory aids to (SLP) Description: LTG:  Patient will use memory compensatory aids to recall biographical/new, daily complex information with cues (SLP) Flowsheets (Taken 04/15/2022 2155) LTG: Patient will use memory compensatory aids to (SLP): Supervision

## 2022-04-15 NOTE — Discharge Instructions (Addendum)
Inpatient Rehab Discharge Instructions  Diana Frost Discharge date and time:    Activities/Precautions/ Functional Status: Activity: no lifting, driving, or strenuous exercise for till cleared by MD Diet: diabetic diet Wound Care: keep wound clean and dry. Touch down weight bearing on left leg.    Functional status:  ___ No restrictions     ___ Walk up steps independently ___ 24/7 supervision/assistance   ___ Walk up steps with assistance ___ Intermittent supervision/assistance  ___ Bathe/dress independently ___ Walk with walker     ___ Bathe/dress with assistance ___ Walk Independently    ___ Shower independently ___ Walk with assistance    ___ Shower with assistance ___ No alcohol     ___ Return to work/school ________ COMMUNITY REFERRALS UPON DISCHARGE:    Home Health:   PT     OT     ST                       Agency: [facility]  [phone]    Medical Equipment/Items Ordered: Wheelchair, Engineer, materials                                                 Agency/Supplier: [facility] [phone]  Special Instructions:    My questions have been answered and I understand these instructions. I will adhere to these goals and the provided educational materials after my discharge from the hospital.  Patient/Caregiver Signature _______________________________ Date __________  Clinician Signature _______________________________________ Date __________  Please bring this form and your medication list with you to all your follow-up doctor's appointments.

## 2022-04-15 NOTE — Progress Notes (Signed)
Occupational Therapy Session Note  Patient Details  Name: Diana Frost MRN: [id number] Date of Birth: 04-23-1945  Today's Date: 04/15/2022 OT Individual Time: 1020-1130 OT Individual Time Calculation (min): 70 min    Short Term Goals: Week 2:  OT Short Term Goal 1 (Week 2): STG = LTG due to ELOS  Skilled Therapeutic Interventions/Progress Updates:  Skilled OT intervention completed with focus on ADL retraining and IADLs with dynamic balance focus. Pt received upright in bed, agreeable to session. Un-rated soreness reported in L hip with nursing notified of pain med request. OT offered rest breaks and repositioning throughout for pain reduction.  Pt reported urge to void. Upon transition to EOB pt reported she couldn't hold it any longer with noted incontinence. Encouraged pt to still get to commode to void further. Reviewed timed toileting as well for preventing incontinence and urgency as well as nursing notified of this plan. Min A needed for bed mobility to EOB > L side as this is different than other room (pt was transferred to new room due to ants). Min A sit > stand using RW, CGA stand pivot to w/c using RW with cues needed for TTWB with cue of "egg under heel" for improved adherence. Total A doffing of brief with mod incontinence of void only noted. Able to void more on commode.   Threaded brief pre-threaded and pants with supervision with increased time, then CGA sit > stand using RW for peri-area with CGA for balance. CGA stand pivot to w/c. Seated at sink, pt completed UB bathing, dressing, oral care and grooming with supervision with cues needed for pulling shirt from back of collar over head to prevent entanglement.   Transported dependently in w/c <> ADL kitchen for time. Pt participated in cog/balance task at counter top using fruit bowl, with pt standing with counter as UE support with CGA and maintained stance balance with unilateral UE support with CGA during sorting of  fruit by "likes/dislikes" and transferred fruit back to bowl with use of spoon. Pt's ability to maintain TTWB while dual tasking did decrease with increased cues needed for "egg under heel" reminder.  Back in room, pt remained seated in w/c, with chair pad alarm on and recreational therapy present with Dixie the therapy dog, with all needs in reach at end of session.   Therapy Documentation Precautions:  Precautions Precautions: Fall Restrictions Weight Bearing Restrictions: Yes LLE Weight Bearing: Touchdown weight bearing    Therapy/Group: Individual Therapy  Blase Mess, MS, OTR/L  04/15/2022, 11:55 AM

## 2022-04-16 ENCOUNTER — Inpatient Hospital Stay (HOSPITAL_COMMUNITY): Payer: Medicare Other

## 2022-04-16 LAB — GLUCOSE, CAPILLARY
Glucose-Capillary: 122 mg/dL — ABNORMAL HIGH (ref 70–99)
Glucose-Capillary: 109 mg/dL — ABNORMAL HIGH (ref 70–99)
Glucose-Capillary: 369 mg/dL — ABNORMAL HIGH (ref 70–99)
Glucose-Capillary: 353 mg/dL — ABNORMAL HIGH (ref 70–99)
Glucose-Capillary: 50 mg/dL — ABNORMAL LOW (ref 70–99)
Glucose-Capillary: 11 mg/dL — CL (ref 70–99)
Glucose-Capillary: 17 mg/dL — CL (ref 70–99)
Glucose-Capillary: 186 mg/dL — ABNORMAL HIGH (ref 70–99)
Glucose-Capillary: 334 mg/dL — ABNORMAL HIGH (ref 70–99)
Glucose-Capillary: 134 mg/dL — ABNORMAL HIGH (ref 70–99)
Glucose-Capillary: 142 mg/dL — ABNORMAL HIGH (ref 70–99)
Glucose-Capillary: 169 mg/dL — ABNORMAL HIGH (ref 70–99)

## 2022-04-16 LAB — MAGNESIUM: Magnesium: 1.8 mg/dL (ref 1.7–2.4)

## 2022-04-16 LAB — GLUCOSE, RANDOM: Glucose, Bld: 142 mg/dL — ABNORMAL HIGH (ref 70–99)

## 2022-04-16 MED ORDER — DEXTROSE 50 % IV SOLN
INTRAVENOUS | Status: AC
  Filled 2022-04-16: qty 50

## 2022-04-16 MED ORDER — INSULIN GLARGINE-YFGN 100 UNIT/ML ~~LOC~~ SOLN
17.0000 [IU] | Freq: Every day | SUBCUTANEOUS | Status: DC
  Administered 2022-04-17: 17 [IU] via SUBCUTANEOUS
  Filled 2022-04-16 (×2): qty 0.17

## 2022-04-16 MED ORDER — MIDODRINE HCL 5 MG PO TABS
5.0000 mg | ORAL_TABLET | Freq: Three times a day (TID) | ORAL | Status: DC
  Administered 2022-04-16 – 2022-04-23 (×21): 5 mg via ORAL
  Filled 2022-04-16 (×22): qty 1

## 2022-04-16 MED ORDER — DEXTROSE 50 % IV SOLN
INTRAVENOUS | Status: AC
  Administered 2022-04-16: 50 mL
  Filled 2022-04-16: qty 50

## 2022-04-16 NOTE — Progress Notes (Signed)
Speech Language Pathology Daily Session Note  Patient Details  Name: Diana Frost MRN: [id number] Date of Birth: 02-21-1946  Today's Date: 04/16/2022 SLP Individual Time: 1030-1130 SLP Individual Time Calculation (min): 60 min  Short Term Goals: Week 2: SLP Short Term Goal 1 (Week 2): STG's = LTG's due to ELOS  Skilled Therapeutic Interventions: Pt seen this date for skilled ST intervention targeting cognitive goals outlined in care plan. Pt received awake/alert and OOB in w/c. Agreeable to intervention in hospital room. Pleasant and participatory throughout.  Today's session with emphasis on cognitive skill training, engagement in leisure activities, and pt education re: living a healthy brain lifestyle. Pt participated in a brainstorming activity related to enjoyable activities that are also cognitively stimulating with Sup-Min A. Demonstrated emergent awareness of current deficits with Sup A and what her limitations will be at home (e.g. will not be doing laundry like before, walking, etc.).Marland Kitchen Able to verbalize need to not place weight through LLE and attempted to correct this morning when stretching this morning. Continues to demonstrate functional recall at the Sup A level. Participated in a game of UNO to promote cognitive stimulation and leisure activity given high stress level prior to admission - she participated with overall Min A verbal cues for problem-solving and recalling rules of play. Pt reported she enjoyed the activity. Living a Brain Healthy Lifestyle handout reviewed and provided to pt; she verbalized understanding.   Pt left in room and OOB in w/c with all safety measures activated and call bell within reach. Continue per current ST POC.  Pain No pain reported; NAD  Therapy/Group: Individual Therapy  Carliss Porcaro A Roland Prine 04/16/2022, 12:59 PM

## 2022-04-16 NOTE — Progress Notes (Signed)
Physical Therapy Session Note  Patient Details  Name: Diana Frost MRN: [id number] Date of Birth: May 22, 1945  Today's Date: 04/16/2022 PT Individual Time: 1301-1343 PT Individual Time Calculation (min): 42 min   Short Term Goals: Week 2:  PT Short Term Goal 1 (Week 2): pt will transfer bed<>chair with LRAD and supervision PT Short Term Goal 1 - Progress (Week 2): Progressing toward goal PT Short Term Goal 2 (Week 2): pt will transfer sit<>stand with LRAD and supervision PT Short Term Goal 2 - Progress (Week 2): Progressing toward goal PT Short Term Goal 3 (Week 2): pt will ambulate 12f with LRAD and supervision while adhering to TDWB precautions PT Short Term Goal 3 - Progress (Week 2): Progressing toward goal Week 3:  PT Short Term Goal 1 (Week 3): STG=LTG due to LOS  Skilled Therapeutic Interventions/Progress Updates:  Received pt sitting in WC, pt agreeable to PT treatment, and denied any pain during session. Pt requested to finish eating, while eating pt explained that she just found out one of her friends passed away yesterday - provided emotional support and therapeutic listening. Also discussed that pt's primary means of mobility will be WC level until due to difficulty maintaining LLE TDWB precautions.   Session with emphasis on functional mobility/transfers, generalized strengthening and endurance, and gait training. Donned R shoe with max A and pt transported to ortho gym dependently for time management purposes. Pt stood with RW and CGA x 2 trials and ambulated 121fx 1 and 1939f 1 with RW and CGA with close WC follow. Pt demonstrated significant improvements in LLE TDWB precautions today and reported no pain in L hip when walking!  Pt performed WC mobility [id number] BUE and supervision with significantly increased time back towards room with cues for hand placement and propulsion technique - transported remainder of way back to [facility] to fatigue.  Concluded session with pt sitting in WC, needs within reach, and chair pad alarm on awaiting upcoming OT session.   Therapy Documentation Precautions:  Precautions Precautions: Fall Restrictions Weight Bearing Restrictions: Yes LLE Weight Bearing: Touchdown weight bearing  Therapy/Group: Individual Therapy AnnaAlfonse Alpers DPT  04/16/2022, 7:16 AM

## 2022-04-16 NOTE — Progress Notes (Signed)
Occupational Therapy Session Note  Patient Details  Name: Diana Frost MRN: [id number] Date of Birth: 1945-04-12  Today's Date: 04/16/2022 OT Individual Time: 1400-1455 OT Individual Time Calculation (min): 55 min    Short Term Goals: Week 1:  OT Short Term Goal 1 (Week 1): Pt will complete 1/3 toileting steps with CGA for standing balance using RW OT Short Term Goal 1 - Progress (Week 1): Met OT Short Term Goal 2 (Week 1): Pt will complete toilet transfer with CGA using RW OT Short Term Goal 2 - Progress (Week 1): Met OT Short Term Goal 3 (Week 1): Pt will use AE PRN during LB clothing with min cues needed for technique OT Short Term Goal 3 - Progress (Week 1): Met Week 2:  OT Short Term Goal 1 (Week 2): STG = LTG due to ELOS  Skilled Therapeutic Interventions/Progress Updates:    1:1 Pt received in the bed when arrived. Self care retraining at shower level. Pt able to get to EOB with bed rail with supervision. Right shoe donned and attempted to ambulated to the bathroom with RW with min A with max to total cues for maintaining weight bearing precautions. Difficulty with turning to sit on the tub bench. Pt able to perform clothing management (pull down pants) with min A to maintain balance. Pt able to shower sit to stand with steadying balance. A for lower legs. Transferred to the w/c with contact guard with mod cues for weight bearing. Pt able to dress UB with setup and LB with cues to thread right LE first and then able to perform sit to stand with UE support on the sink but required A to pull up pants. Shoes and socks left off. Pt transferred back into bed with contact guard to transfer but max A to get into the bed correctly.Left resting in the bed with lab present. Call bell in place.   Therapy Documentation Precautions:  Precautions Precautions: Fall Restrictions Weight Bearing Restrictions: Yes LLE Weight Bearing: Touchdown weight bearing  Pain:  No c/o pain in session     Therapy/Group: Individual Therapy  Willeen Cass [facility] 04/16/2022, 3:34 PM

## 2022-04-16 NOTE — Progress Notes (Signed)
Physical Therapy Session Note  Patient Details  Name: Diana Frost MRN: [id number] Date of Birth: 05/16/1945  Today's Date: 04/16/2022 PT Individual Time: 0931-1015 PT Individual Time Calculation (min): 44 min   Short Term Goals: Week 2:  PT Short Term Goal 1 (Week 2): pt will transfer bed<>chair with LRAD and supervision PT Short Term Goal 1 - Progress (Week 2): Progressing toward goal PT Short Term Goal 2 (Week 2): pt will transfer sit<>stand with LRAD and supervision PT Short Term Goal 2 - Progress (Week 2): Progressing toward goal PT Short Term Goal 3 (Week 2): pt will ambulate 25f with LRAD and supervision while adhering to TDWB precautions PT Short Term Goal 3 - Progress (Week 2): Progressing toward goal  Skilled Therapeutic Interventions/Progress Updates: Pt presents supine in bed asleep but arouses easily and agreeable to therapy.  Pt required Total A to don TEDS.  Pt transfers sup to sit w/ supervision and hand rails.  Pt BP monitored at 124/73 w/o abd binder and slight symptoms, BP w/ binder 113/72 and no symptoms reported w/ transfer to w/c.  Pt requested need for toilet.  Pt transferred SPT w/ RW and min A bed > w/c w/ constant cueing for WB restrictions poorly adhered to, no c/o pain.  Pt transferred to toilet w/ min A and improved TDWB LLE.  Pt able to perform pericare after continent bladder, NT to chart.  Pt returned to w/c and then to room.  Pt able to thread feet in pant legs w/ min A.  Pt doffed gown w/ min A and donned pullover shirt w/ set-up.  Pt transferred sit to stand w/ min A and pulled up pants.  Pt wheeled to small gym for time conservation.  Pt practiced sit to stand transfers maintaining TDWB LLE.  Pt performed standing marching and knee flexion LLE only 3 x 10.  Pt returned to room and remained sitting in w/c w/ all needs in reach.     Therapy Documentation Precautions:  Precautions Precautions: Fall Restrictions Weight Bearing Restrictions: Yes LLE  Weight Bearing: Touchdown weight bearing General:   Vital Signs:  Pain:4/10 w/ activity. Pain Assessment Pain Scale: 0-10 Pain Score: 0-No pain    Therapy/Group: Individual Therapy  JLadoris Gene1/02/2023, 10:22 AM

## 2022-04-16 NOTE — Congregational Nurse Program (Signed)
Hypoglycemic Event  CBG: 50  Treatment: 8 oz juice/soda  Symptoms: None  Follow-up CBG: Time:2152  CBG Result:11  Possible Reasons for Event: Vomiting  Comments/MD notified:yes    Diana Frost  Hypoglycemic Event  CBG: 11  Treatment: D50 50 mL (25 gm)  Symptoms: Sweaty  Follow-up CBG: Time:2202 CBG Result:17  Possible Reasons for Event: Unknown  Comments/MD notified:yes    Diana Frost

## 2022-04-16 NOTE — Progress Notes (Signed)
Writer inspected incision, incision approximated closed with sutures, no redness or swelling noted to area.

## 2022-04-16 NOTE — Progress Notes (Signed)
Recreational Therapy Session Note  Patient Details  Name: Diana Frost MRN: [id number] Date of Birth: [birth year] Today's Date: 04/16/2022  Pain: no c/o Skilled Therapeutic Interventions/Progress Updates: Pt at the sink brushing her hair upon arrival,  Chair alarm attached and plugged into the call system but the device was lying of the floor between pt and the bed.  Pt unaware.  Session focused on leisure discussion with activity analysis including past and current interests.  Pt very talkative today reminiscing about her family and leisure interests. [place] 04/16/2022, 8:41 AM

## 2022-04-16 NOTE — Progress Notes (Signed)
PROGRESS NOTE   Subjective/Complaints: No new complaints this morning Sleepy Requested ortho eval prior to d/c Discussed touch toe weight bearing with daughter   ROS:  Pt denies SOB, abd pain, CP, N/V/C/D, and vision changes, +fatigue, +orthostasis, +shoulder pain, +lethargy this morning as per therapy, +groin pain- improved  Objective:   No results found. No results for input(s): "WBC", "HGB", "HCT", "PLT" in the last 72 hours.    Recent Labs    04/15/22 1937  NA 136  K 3.6  CL 98  CO2 28  GLUCOSE 223*  BUN 19  CREATININE 0.95  CALCIUM 8.7*      Intake/Output Summary (Last 24 hours) at 04/16/2022 1411 Last data filed at 04/16/2022 1250 Gross per 24 hour  Intake 700 ml  Output --  Net 700 ml        Physical Exam: Vital Signs Blood pressure (!) 110/59, pulse 99, temperature 98.4 F (36.9 C), temperature source Oral, resp. rate 16, height '5\' 2"'$  (1.575 m), weight 50.2 kg, SpO2 99 %. 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 Functional deficits which require 3+ hours per day of interdisciplinary therapy in a comprehensive inpatient rehab setting. Physiatrist is providing close team supervision and 24 hour management of active medical problems listed below. Physiatrist and rehab team continue to assess barriers to discharge/monitor patient progress toward functional and medical goals  Care Tool:  Bathing    Body parts bathed by patient: Right arm, Left arm, Chest, Abdomen, Front perineal area, Buttocks, Right upper leg, Left upper leg, Face, Right lower leg, Left lower leg   Body parts bathed by helper: Front perineal area, Buttocks, Right lower leg, Left lower leg     Bathing assist Assist Level: Contact Guard/Touching assist     Upper Body Dressing/Undressing Upper body dressing   What is the patient wearing?: Pull over shirt    Upper body assist Assist Level: Supervision/Verbal cueing    Lower Body Dressing/Undressing Lower body dressing      What is the patient wearing?: Incontinence brief, Pants     Lower body assist Assist for lower body dressing: Minimal Assistance - Patient > 75%     Toileting Toileting    Toileting assist Assist for toileting: Minimal Assistance - Patient > 75%  Transfers Chair/bed transfer  Transfers assist     Chair/bed transfer assist level: Contact Guard/Touching assist     Locomotion Ambulation   Ambulation assist   Ambulation activity did not occur: Safety/medical concerns (fatigue, poor adherance to TDWB precautions)  Assist level: Contact Guard/Touching assist Assistive device: Walker-rolling Max distance: 70f   Walk 10 feet activity   Assist  Walk 10 feet activity did not occur: Safety/medical concerns (fatigue, poor adherance to TDWB precautions)  Assist level: Contact Guard/Touching assist Assistive device: Walker-rolling   Walk 50 feet activity   Assist Walk 50 feet with 2  turns activity did not occur: Safety/medical concerns (fatigue, poor adherance to TDWB precautions)         Walk 150 feet activity   Assist Walk 150 feet activity did not occur: Safety/medical concerns (fatigue, poor adherance to TDWB precautions)         Walk 10 feet on uneven surface  activity   Assist Walk 10 feet on uneven surfaces activity did not occur: Safety/medical concerns (fatigue, poor adherance to TDWB precautions)         Wheelchair     Assist Is the patient using a wheelchair?: Yes Type of Wheelchair: Manual    Wheelchair assist level: Supervision/Verbal cueing Max wheelchair distance: 1593f   Wheelchair 50 feet with 2 turns activity    Assist        Assist Level: Supervision/Verbal cueing   Wheelchair 150 feet activity     Assist      Assist Level: Supervision/Verbal cueing   Blood pressure (!) 110/59, pulse 99, temperature 98.4 F (36.9 C), temperature source Oral, resp. rate 16, height '5\' 2"'$  (1.575 m), weight 50.2 kg, SpO2 99 %.  Medical Problem List and Plan: 1. Functional deficits secondary to Left femoral neck fracture s/p percutaneous fixation on 03/29/22             -patient may shower, please cover incision             -ELOS/Goals: 12-14 days , PT/OT supervision to mod I            Continue CIR- PT and OT-   Vitamin D supplement started 2.  Impaired mobility: [place] and is negative for clots. Continue Lovenox             -antiplatelet therapy: ASA '81mg'$  3. Pain from hip fracture: decrease Norco to '5mg'$  q12H prn due to hypotension. Scheduled tylenol increased to 1,'000mg'$  TID.  4. Daytime somnolence: decrease melatonin to '3mg'$  HS 5. Neuropsych/cognition: This patient is capable of making decisions on her own behalf. 6. Skin/Wound Care: Routine pressure relief measures.  7. Fluids/Electrolytes/Nutrition: Monitor I/O. Check CMET in am.  8. Left subcapital femur Fx s/p fixation: TDWB per Dr. HoEarnestine Leys.T1DM with  prior episodes of hypoglycemia: continue omnipod and dexcom --will consult diabetic coordinator to assist with management.    decrease Novolog to 2U TID and increase semglee to 17U daily. Add 2U with lunc prn for CBG>250             --Followed by Dr ThMee Hives10. Orthostatic hypotension: abdominal binder ordered. Hydrocodone dose lowered and frequency decreased to qH prn --Also reports of diplopia per optho notes.  - Continue Midodrine for BP support. Compression garments ordered for during therapy. Improved.  11. CAD/Blood pressure: Denies chest pain 12. Depression/anxiety?: Continue Zoloft and Bupropion. Supplement magnesium to goal of 2 13. Acute blood loss anemia: Stable around 9.0. Recheck CBC in am.              -  Continue iron supplementation 14. Hypokalemia: Was supplemented X 1 today. K+ 3.4 12/28. Recheck in am.   12/30- K+ 3.5- will recheck Monday 15. Constipation: changed senna to HS  12/30- LBM 2 days ago- is normal for her to go q2-3 days.  16. Fatigue: checked B12 level and is normal 17. Suboptimal magnesium: supplemented 2grams IV on 12/28, 1gm ordered 1/3. Repeat today 18. Radiation induced nausea: discussed hyperbaric oxygen therapy outpatient. Scheduled compazine prior to breakfast. Resolved.  19. Suboptimal vitamin D level: continue ergocalciferol 50,000U ordered q7 days 20. Tachycardia: EKG ordered and shows sinus tachycardia, discussed with patient. Improved.  21. Lethargy: CBC and BMP reviewed and stable, improved   LOS: 14 days A FACE TO FACE EVALUATION WAS PERFORMED  Martha Clan P Chaitanya Amedee 04/16/2022, 2:11 PM

## 2022-04-16 NOTE — Progress Notes (Signed)
Hypoglycemic Event  CBG:17  Treatment: D50 50 mL (25 gm)  Symptoms: Sweaty  Follow-up CBG: Time:2207 CBG Result:334  Possible Reasons for Event: Unknown  Comments/MD notified:yes    Diana Frost

## 2022-04-16 NOTE — Discharge Summary (Signed)
Physician Discharge Summary  Patient ID: Diana Frost MRN: [id number] DOB/AGE: [birth year] 77 y.o.  Admit date: 04/02/2022 Discharge date: 04/23/2022  Discharge Diagnoses:  Principal Problem:   Femur fracture, left ([place]) Active Problems:   Type 1 diabetes mellitus (HCC)   Normocytic anemia Suboptimal Vitamin D level Depression/anxiety Chronic hypotension   Discharged Condition: stable  Significant Diagnostic Studies: DG HIP UNILAT WITH PELVIS 2-3 VIEWS LEFT  Result Date: 04/16/2022 CLINICAL DATA:  Pain EXAM: DG HIP (WITH OR WITHOUT PELVIS) 3V LEFT COMPARISON:  None Available. FINDINGS: Osseous structures are osteopenic. Pelvic ring is intact. Lumbosacral degenerative changes identified. Patient is status post bilateral femoral neck ORIF with compression screws. There is grossly anatomic alignment. IMPRESSION: Bilateral femoral neck fractures status post ORIF. Osteopenia. Degenerative changes. Electronically Signed   By: Sammie Bench M.D.   On: 04/16/2022 15:31   VAS [name] LOWER EXTREMITY VENOUS (DVT)  Result Date: 04/08/2022  Lower Venous DVT Study Patient Name:  Diana Frost  Date of Exam:   04/07/2022 Medical Rec #: [id number]          Accession #:    [id number] Date of Birth: [birth year]         Patient Gender: F Patient Age:   89 years Exam Location:  [facility] Procedure:      VAS [name] LOWER EXTREMITY VENOUS (DVT) Referring Phys: Leeroy Cha --------------------------------------------------------------------------------  Indications: Pain and swelling left leg, s/p percutaneous fixation of femoral neck.  Comparison Study: No prior studies. Performing Technologist: Darlin Coco RDMS, RVT  Examination Guidelines: A complete evaluation includes B-mode imaging, spectral Doppler, color Doppler, and power Doppler as needed of all accessible portions of each vessel. Bilateral testing is considered an integral part of a complete examination. Limited examinations for  reoccurring indications may be performed as noted. The reflux portion of the exam is performed with the patient in reverse Trendelenburg.  +-----+---------------+---------+-----------+----------+--------------+ RIGHTCompressibilityPhasicitySpontaneityPropertiesThrombus Aging +-----+---------------+---------+-----------+----------+--------------+ CFV  Full           Yes      Yes                                 +-----+---------------+---------+-----------+----------+--------------+   +---------+---------------+---------+-----------+----------+--------------+ LEFT     CompressibilityPhasicitySpontaneityPropertiesThrombus Aging +---------+---------------+---------+-----------+----------+--------------+ CFV      Full           Yes      Yes                                 +---------+---------------+---------+-----------+----------+--------------+ SFJ      Full                                                        +---------+---------------+---------+-----------+----------+--------------+ FV Prox  Full                                                        +---------+---------------+---------+-----------+----------+--------------+ FV Mid   Full                                                        +---------+---------------+---------+-----------+----------+--------------+  FV DistalFull                                                        +---------+---------------+---------+-----------+----------+--------------+ PFV      Full                                                        +---------+---------------+---------+-----------+----------+--------------+ POP      Full           Yes      Yes                                 +---------+---------------+---------+-----------+----------+--------------+ PTV      Full                                                        +---------+---------------+---------+-----------+----------+--------------+  PERO     Full                                                        +---------+---------------+---------+-----------+----------+--------------+    Summary: RIGHT: - No evidence of common femoral vein obstruction.  LEFT: - There is no evidence of deep vein thrombosis in the lower extremity.  - No cystic structure found in the popliteal fossa.  *See table(s) above for measurements and observations. Electronically signed by Deitra Mayo MD on 04/08/2022 at 6:41:20 AM.    Final     Labs:  Basic Metabolic Panel: Recent Labs  Lab 04/20/22 0538  NA 138  K 3.9  CL 100  CO2 29  GLUCOSE 220*  BUN 17  CREATININE 0.83  CALCIUM 8.9    CBC:    Latest Ref Rng & Units 04/20/2022    5:38 AM 04/13/2022    6:19 AM 04/10/2022    9:55 AM  CBC  WBC 4.0 - 10.5 K/uL 5.9  4.6  6.4   Hemoglobin 12.0 - 15.0 g/dL 10.2  9.8  10.5   Hematocrit 36.0 - 46.0 % 31.7  29.0  32.0   Platelets 150 - 400 K/uL 262  353  421       Brief HPI:   Diana Frost is a 77 y.o. female with history of T1DM, CAD, Hypotension, multiple falls w/gait disorder who was admitted ot [facility] 03/28/22 after fall with left hip pain due to non-displaced left subcapital hip fracture. She underwent percutaneous fixation of femoral neck by Dr. Earnestine Leys on 03/29/22 and made TDWB. Hospital course significant of issues regarding hypokalemia requiring supplementation, acute blood loss anemia, postop pain as well as reports of confusion and difficulty maintaining touchdown weightbearing.  Pain and blood pressures were noted to be reported to be controlled.  PT OT was working with patient and CIR was recommended due to functional decline.  Hospital Course: Diana Frost was admitted to rehab 04/02/2022 for inpatient therapies to consist of PT and OT at least three hours five days a week. Past admission physiatrist, therapy team and rehab RN have worked together to provide customized collaborative inpatient rehab.  Her blood  pressures were monitored on TID basis and have been stable on current dose of midodrine.  Her pain and touchdown weightbearing were noted to be an issue limiting mobility.  Tylenol was scheduled QID and hydrocodone was d/c due to side effects of confusion.  As pain controlled and activity tolerance improved, she was showing ability to maintain her TDWB as well as improvement in progress.  Follow-up labs showed acute blood loss anemia to be improving.  Check will BMET showed electrolytes and renal status to be within normal limits.  Ergocalciferol was added due to suboptimal vitamin D levels.  Her p.o. intake has been good and she is continent of B&B.  She continues to have intermittent issues with nausea especially in early a.m. but this is chronic and usually improves as the day progresses.  Her diabetes has been monitored with ac/hs CBG checks and SSI was use prn for tighter BS control.  Due to issues with confusion initially, insulin pump was discontinued due to question of patient's ability to use and monitor pump.  Her blood sugars and however were noted to have wide swings off of the pump.  As her mentation was noted to be improving pump was placed back with improvement in blood sugars 100 to low 200 range.  BLE Dopplers done due to pain and were negative for DVT.  Hip x-rays were done for follow-up with results to orthopedics.  She has made steady gains and supervision is recommended for safety.  She will continue to receive follow-up home health PT, ST and OT by [facility] home health after discharge   Rehab course: During patient's stay in rehab weekly team conferences were held to monitor patient's progress, set goals and discuss barriers to discharge. At admission, patient required mod assist with mobility and max assist with basic ADL tasks.  Speech therapy was consulted on 01/02 due to reports of cognitive deficits and evaluation revealed SLUMS score 18/30. She has shown improvement in activity as  well as ability to compensate for deficits.  She is able to complete ADLs with supervision and requires supervision for transfers and to ambulate 30 feet with verbal cues and rolling walker.  Her cognition has improved and she was reported to be functioning at her baseline cognition.  Family education has been completed with daughter.   Disposition: Home  Diet: Carb Modified.   Special Instructions: Continue touchdown on LLE until cleared by orthopedics. Follow-up with endocrine for further adjustment in insulin pump.  Discharge Instructions     Ambulatory referral to Physical Medicine Rehab   Complete by: As directed       Allergies as of 04/23/2022       Reactions   Bupropion    Other reaction(s): Other (See Comments) Constipation    Percocet [oxycodone-acetaminophen] Nausea And Vomiting   Statins    Joint pain   Sulfa Antibiotics Nausea And Vomiting        Medication List     STOP taking these medications    insulin lispro 100 UNIT/ML injection Commonly known as: HUMALOG       TAKE these medications    acetaminophen 500 MG tablet Commonly known as: TYLENOL Take 2 tablets (1,000 mg total) by mouth  3 (three) times daily. What changed:  medication strength how much to take when to take this reasons to take this   alendronate 70 MG tablet Commonly known as: FOSAMAX Take 70 mg by mouth once a week.   aspirin 81 MG tablet Take 1 tablet (81 mg total) by mouth daily.   buPROPion 150 MG 24 hr tablet Commonly known as: WELLBUTRIN XL Take 1 tablet (150 mg total) by mouth every morning.   docusate sodium 100 MG capsule Commonly known as: COLACE Take 2 capsules (200 mg total) by mouth daily. What changed:  how much to take when to take this Notes to patient: Over the counter   ferrous sulfate 325 (65 FE) MG tablet Take 1 tablet (325 mg total) by mouth daily with breakfast. Notes to patient: Over the counter   GLUCAGON EMERGENCY IJ Inject as directed  as needed (for low blood sugar).   insulin pump Soln INSULIN PUMP THREE TIMES DAILY.   melatonin 3 MG Tabs tablet Take 1 tablet (3 mg total) by mouth at bedtime as needed.   menthol-cetylpyridinium 3 MG lozenge Commonly known as: CEPACOL Take 1 lozenge (3 mg total) by mouth as needed for sore throat.   midodrine 5 MG tablet Commonly known as: PROAMATINE Take 1 tablet (5 mg total) by mouth 3 (three) times daily.   rosuvastatin 10 MG tablet Commonly known as: CRESTOR Take 1 tablet (10 mg total) by mouth at bedtime.   senna-docusate 8.6-50 MG tablet Commonly known as: Senokot-S Take 2 tablets by mouth 2 (two) times daily. What changed:  how much to take when to take this reasons to take this   sertraline 100 MG tablet Commonly known as: ZOLOFT Take 1 tablet (100 mg total) by mouth daily.   Vitamin D (Ergocalciferol) 1.25 MG (50000 UNIT) Caps capsule Commonly known as: DRISDOL Take 1 capsule (50,000 Units total) by mouth every 7 (seven) days.   zinc gluconate 50 MG tablet Take 1 tablet (50 mg total) by mouth daily.        Follow-up Information     Earnestine Leys, MD Follow up.   Specialty: Orthopedic Surgery Why: Call in 1-2 days for post hospital follow up Contact information: [place] [address] [phone]         Latanya Maudlin, NP Follow up.   Specialty: Family Medicine Why: Call in 1-2 days for post hospital follow up Contact information: [place] [address] [phone]         Izora Ribas, MD Follow up.   Specialty: Physical Medicine and Rehabilitation Why: office will call you with follow up appointment Contact information: [address] [place] [address] [phone]         Lonia Farber, MD Follow up.   Specialties: Internal Medicine, Endocrinology Contact information: [address] [place] [address] [phone]                  Signed: Bary Leriche 04/26/2022, 5:10 PM

## 2022-04-17 LAB — GLUCOSE, CAPILLARY
Glucose-Capillary: 118 mg/dL — ABNORMAL HIGH (ref 70–99)
Glucose-Capillary: 141 mg/dL — ABNORMAL HIGH (ref 70–99)
Glucose-Capillary: 148 mg/dL — ABNORMAL HIGH (ref 70–99)
Glucose-Capillary: 270 mg/dL — ABNORMAL HIGH (ref 70–99)
Glucose-Capillary: 273 mg/dL — ABNORMAL HIGH (ref 70–99)
Glucose-Capillary: 321 mg/dL — ABNORMAL HIGH (ref 70–99)
Glucose-Capillary: 376 mg/dL — ABNORMAL HIGH (ref 70–99)
Glucose-Capillary: 378 mg/dL — ABNORMAL HIGH (ref 70–99)
Glucose-Capillary: 401 mg/dL — ABNORMAL HIGH (ref 70–99)
Glucose-Capillary: 432 mg/dL — ABNORMAL HIGH (ref 70–99)

## 2022-04-17 MED ORDER — DEXTROSE 50 % IV SOLN
INTRAVENOUS | Status: AC
  Administered 2022-04-17: 25 mL
  Filled 2022-04-17: qty 50

## 2022-04-17 MED ORDER — INSULIN DETEMIR 100 UNIT/ML ~~LOC~~ SOLN
7.0000 [IU] | Freq: Two times a day (BID) | SUBCUTANEOUS | Status: DC
  Administered 2022-04-18: 7 [IU] via SUBCUTANEOUS
  Filled 2022-04-17 (×2): qty 0.07

## 2022-04-17 MED ORDER — INSULIN ASPART 100 UNIT/ML IJ SOLN: 1.0000 [IU] | Freq: Two times a day (BID) | INTRAMUSCULAR | Status: DC

## 2022-04-17 MED ORDER — POLYETHYLENE GLYCOL 3350 17 G PO PACK
17.0000 g | PACK | Freq: Once | ORAL | Status: AC
  Administered 2022-04-17: 17 g via ORAL

## 2022-04-17 NOTE — Progress Notes (Addendum)
Spoke with Dr. Sherren Mocha clinical staff after he had evaluated BS. He  recommends resumption of insulin pump as she has done better with this. He is unable to recommend any changes at this time and recommends resuming home settings.    Reached out to Dr. Ammie Ferrier office and left message that hip X rays were done and can be evaluated in epic. Hip incision is healing  well and orders written to remove sutures.

## 2022-04-17 NOTE — Progress Notes (Signed)
Pt's blood sugar this morning was 401 '@0615'$ .  This LPN called Algis Liming and she stated to give pt a glass of water and 2 units of insulin.  She didn't want to give pt the 6 units that the [facility] called for until she was ready to eat breakfast due to last nights events of her blood sugar dropping.  Pt is ready to eat so her blood sugar was checked again and it was 432 '@0656'$ .  Charge nurse made aware.  Pam made aware and she said to give pt 2 more glasses of water and 4 units of insulin.

## 2022-04-17 NOTE — Progress Notes (Signed)
PROGRESS NOTE   Subjective/Complaints: Patient feels well this morning. Discussed discontinuing standing mealtime insulin to avoid hypoglycemic episodes. Discussed plan for d/c next week.   ROS:  Pt denies SOB, abd pain, CP, N/V/C/D, and vision changes, +fatigue, +orthostasis, +shoulder pain, +lethargy this morning as per therapy, +groin pain- improved  Objective:   DG HIP UNILAT WITH PELVIS 2-3 VIEWS LEFT  Result Date: 04/16/2022 CLINICAL DATA:  Pain EXAM: DG HIP (WITH OR WITHOUT PELVIS) 3V LEFT COMPARISON:  None Available. FINDINGS: Osseous structures are osteopenic. Pelvic ring is intact. Lumbosacral degenerative changes identified. Patient is status post bilateral femoral neck ORIF with compression screws. There is grossly anatomic alignment. IMPRESSION: Bilateral femoral neck fractures status post ORIF. Osteopenia. Degenerative changes. Electronically Signed   By: Sammie Bench M.D.   On: 04/16/2022 15:31   No results for input(s): "WBC", "HGB", "HCT", "PLT" in the last 72 hours.    Recent Labs    04/15/22 1937 04/16/22 2235  NA 136  --   K 3.6  --   CL 98  --   CO2 28  --   GLUCOSE 223* 142*  BUN 19  --   CREATININE 0.95  --   CALCIUM 8.7*  --       Intake/Output Summary (Last 24 hours) at 04/17/2022 1631 Last data filed at 04/17/2022 1317 Gross per 24 hour  Intake 286 ml  Output --  Net 286 ml        Physical Exam: Vital Signs Blood pressure 106/68, pulse 94, temperature 98.2 F (36.8 C), temperature source Oral, resp. rate 16, height '5\' 2"'$  (1.575 m), weight 50.2 kg, SpO2 97 %. 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 Functional deficits which require 3+ hours per day of interdisciplinary therapy in a comprehensive inpatient rehab setting. Physiatrist is providing close team supervision and 24 hour management of active medical problems listed below. Physiatrist and rehab team continue to assess barriers to discharge/monitor patient progress toward functional and medical goals  Care Tool:  Bathing    Body parts bathed by patient: Right arm, Left arm, Chest, Abdomen, Front perineal area, Buttocks, Right upper leg, Left upper leg, Face   Body parts bathed by helper: Right lower leg, Left lower leg     Bathing assist Assist Level: Contact Guard/Touching assist  Upper Body Dressing/Undressing Upper body dressing   What is the patient wearing?: Pull over shirt    Upper body assist Assist Level: Set up assist    Lower Body Dressing/Undressing Lower body dressing      What is the patient wearing?: Incontinence brief, Pants     Lower body assist Assist for lower body dressing: Minimal Assistance - Patient > 75%     Toileting Toileting    Toileting assist Assist for toileting: Minimal Assistance - Patient > 75%     Transfers Chair/bed transfer  Transfers assist     Chair/bed transfer assist level: Minimal Assistance - Patient > 75%      Locomotion Ambulation   Ambulation assist   Ambulation activity did not occur: Safety/medical concerns (fatigue, poor adherance to TDWB precautions)  Assist level: Contact Guard/Touching assist Assistive device: Walker-rolling Max distance: 52f   Walk 10 feet activity   Assist  Walk 10 feet activity did not occur: Safety/medical concerns (fatigue, poor adherance to TDWB precautions)  Assist level: Contact Guard/Touching assist Assistive device: Walker-rolling   Walk 50 feet activity   Assist Walk 50 feet with 2 turns activity did not occur: Safety/medical concerns (fatigue, poor adherance to TDWB precautions)         Walk 150 feet activity   Assist Walk 150 feet activity did not occur: Safety/medical concerns (fatigue, poor adherance to TDWB precautions)         Walk 10 feet on uneven surface  activity   Assist Walk 10 feet on uneven surfaces activity did not occur: Safety/medical concerns (fatigue, poor adherance to TDWB precautions)         Wheelchair     Assist Is the patient using a wheelchair?: Yes Type of Wheelchair: Manual    Wheelchair assist level: Supervision/Verbal cueing Max wheelchair distance: 1531f   Wheelchair 50 feet with 2 turns activity    Assist        Assist Level: Supervision/Verbal cueing   Wheelchair 150 feet activity     Assist      Assist Level: Supervision/Verbal cueing   Blood pressure 106/68, pulse 94, temperature 98.2 F (36.8 C), temperature source Oral, resp. rate 16, height '5\' 2"'$  (1.575 m), weight 50.2 kg, SpO2 97 %.  Medical Problem List and Plan: 1. Functional deficits secondary to Left femoral neck fracture s/p percutaneous fixation on 03/29/22             -patient may shower, please cover incision             -ELOS/Goals: 12-14 days , PT/OT supervision to mod I            Continue CIR- PT and OT-   Vitamin D supplement started 2.  Impaired mobility: [place] and is negative for  clots. Continue Lovenox             -antiplatelet therapy: ASA '81mg'$  3. Pain from hip fracture: decrease Norco to '5mg'$  q12H prn due to hypotension. Scheduled tylenol increased to 1,'000mg'$  TID.  4. Daytime somnolence: decrease melatonin to '3mg'$  HS 5. Neuropsych/cognition: This patient is capable of making decisions on her own behalf. 6. Skin/Wound Care: Routine pressure relief measures.  7. Fluids/Electrolytes/Nutrition: Monitor I/O. Check CMET in am.  8. Left subcapital femur Fx s/p fixation: TDWB per Dr. HoEarnestine Leys.T1DM with prior episodes of hypoglycemia: continue omnipod and dexcom --will consult diabetic coordinator to assist with management.    D/c scheduled novolog with meals, decrease semglee to 8U  BID. Add 2U with lunc prn for CBG>250             --Followed by Dr Mee Hives. 10. Orthostatic hypotension: abdominal binder ordered. Hydrocodone dose lowered and frequency decreased to qH prn --Also reports of diplopia per optho notes.  - Continue Midodrine for BP support. Compression garments ordered for during therapy. Improved.  11. CAD/Blood pressure: Denies chest pain 12. Depression/anxiety?: Continue Zoloft and Bupropion. Supplement magnesium to goal of 2 13. Acute blood loss anemia: Stable around 9.0. Recheck CBC in am.              -Continue iron supplementation 14. Hypokalemia: Was supplemented X 1 today. K+ 3.4 12/28. Recheck in am.   12/30- K+ 3.5- will recheck Monday 15. Constipation: changed senna to HS  12/30- LBM 2 days ago- is normal for her to go q2-3 days.  16. Fatigue: checked B12 level and is normal 17. Suboptimal magnesium: supplemented 2grams IV on 12/28, 1gm ordered 1/3.  18. Radiation induced nausea: discussed hyperbaric oxygen therapy outpatient. Scheduled compazine prior to breakfast. Resolved.  19. Suboptimal vitamin D level: continue ergocalciferol 50,000U ordered q7 days 20. Tachycardia: EKG ordered and shows sinus tachycardia, discussed with patient.  Improved.  21. Lethargy: CBC and BMP reviewed and stable, improved, discussed was likely due to elevated CBG that morning.    LOS: 15 days A FACE TO FACE EVALUATION WAS PERFORMED  Clide Deutscher Casilda Pickerill 04/17/2022, 4:31 PM

## 2022-04-17 NOTE — Progress Notes (Signed)
Physical Therapy Session Note  Patient Details  Name: Diana Frost MRN: [id number] Date of Birth: Mar 19, 1946  Today's Date: 04/17/2022 PT Individual Time: 1031-1045 PT Individual Time Calculation (min): 14 min   Short Term Goals: Week 2:  PT Short Term Goal 1 (Week 2): pt will transfer bed<>chair with LRAD and supervision PT Short Term Goal 1 - Progress (Week 2): Progressing toward goal PT Short Term Goal 2 (Week 2): pt will transfer sit<>stand with LRAD and supervision PT Short Term Goal 2 - Progress (Week 2): Progressing toward goal PT Short Term Goal 3 (Week 2): pt will ambulate 39f with LRAD and supervision while adhering to TDWB precautions PT Short Term Goal 3 - Progress (Week 2): Progressing toward goal  Skilled Therapeutic Interventions/Progress Updates: Pt presents supine in bed and asleep but arouses.  Pt states rough night and morning w/ "crashing BS and then high".  BS are now 118 and pt agreeable to therapy.  Pt transfers sup to sit w/ mod A and verbal cues.  Pt already wearing TED hose and abd binder donned in sitting.  BP of 98/64.  Pt transferred sit to stand w/ min A but frequent verbal cues for TDWB LLE.  Pt transfers to w/c.  Pt remained sitting in w/c w/ seat alarm on and all needs in reach.  Missed time 16 min.     Therapy Documentation Precautions:  Precautions Precautions: Fall Restrictions Weight Bearing Restrictions: Yes LLE Weight Bearing: Touchdown weight bearing General: PT Amount of Missed Time (min): 16 Minutes PT Missed Treatment Reason: Nursing care Vital Signs: Therapy Vitals BP: 123/64 Patient Position (if appropriate): Sitting Pain:no c/o Pain Assessment Pain Score: 2  Mobility:       Therapy/Group: Individual Therapy  JLadoris Gene1/03/2023, 10:47 AM

## 2022-04-17 NOTE — Progress Notes (Signed)
Late note for last night/this am events: Did follow up on patient this am who usually has good po intake but reported that she did not eat her chicken breast last night because it was tough and did not ask for anything additional.   Per nursing, daughter called to inform that patient's BS was low and it dropped progressively from 50 to 11. She threw up the OJ that was offered to her. Per nurse,  rapid was called due to severe hypoglycemia and recommended 2-3 amps of dextrose? One was given with BS up to 334 which is when I was contacted. I recommended repeat and to continue to monitor  BS every 30-60 minutes till it stabilizes and to offer protein shake to help with BS stabilization, serum BS ordered for verification. Received call back @ 11:13 pm with report that BS now down to 142 and patient was attempting to drink her shake but was very fatigued. Advised them to administer 1/2 amp of dextrose due to concerns that BS would continue to trend down thru the night as well as to continue to monitor BS every hour till 3 pm, if stable to recheck at 5 or so then ac breakfast. Received call 6:20 am that BS now to 401 and 6 units recommended per protocol. Advised them to have pt drink 1-2 glasses of water and administer 2 units and recheck prior to b'fast. Repeat BS 30 minutes later 432 and patient ready for breakfast-->advised only to give 4 units as well as 2 additional glasses of water.  Above was discussed with Dr. Adam Phenix and decided that patient likely needs her long acting split BID for more consistent coverage. Diabetic coordinator was contacted and called back but patient already received insulin glargline. Patient has cleared cognitively and feel that we can try insulin pump again. We will start levemir split dose tomorrow, daughter to bring in supplies and pump can be placed then. Nursing to oversee and help with carb counting. Have reached out to Dr. Jenetta Downer' Conner for additional input.

## 2022-04-17 NOTE — Progress Notes (Signed)
Occupational Therapy Session Note  Patient Details  Name: Diana Frost MRN: [id number] Date of Birth: [birth year]  Today's Date: 04/17/2022 OT Individual Time: 1105-1200 OT Individual Time Calculation (min): 55 min    Short Term Goals: Week 2:  OT Short Term Goal 1 (Week 2): STG = LTG due to ELOS  Skilled Therapeutic Interventions/Progress Updates:  Skilled OT intervention completed with focus on family education, BUE endurance. Pt received seated in w/c, agreeable to session. No pain reported.  PA in room upon OT arrival, reviewing with pt her episodes of low CBGs and gave OT verbal orders to do seated therapy today for diabetic related fatigue and lethargy. Of note- pt did not recognize this OT that has worked with her for >2 weeks and was slow to initiate and with low activity tolerance this session.  Daughter called pt at start of session, with OT offering any education to daughter while on the phone in prep for family education on Monday. Daughter expressed her frustration with nursing staff and the inconsistent regulation with pt's CBG as well as pt's current DC plans as she feels pt is not safe enough to DC next week and expected her to be at a independent level by the time she left. Daughter stated "[name] are just shipping her out and this hospital has done a very poor job with my mother's care." OT offered education on rehab purpose, initial goals and then downgraded goals due to gradual/slow progress with inability to extend given pt's plateau functionally. Also offered options for hired HA and/or other facilities for greater support at [place] with daughter expressing frustration with CSW in coordinating these options with daughter requesting to speak to "hospital administrator." OT assured her that applicable parties including Medical sales representative, Retail buyer and CSW would all be notified of her request to speak with them regarding her concerns. All parties notified after session  respectively.  Pt agreeable to BUE exercises as tolerated, and completed the following seated: (With yellow theraband) 2x12 reps Horizontal abduction Self-anchored shoulder flexion each arm Self-anchored bicep flexion each arm Self-anchored tricep extension each arm Therapist anchored scapular retraction each arm Alternating chest presses Shoulder external rotation Shoulder extension Shoulder diagonal pulls  Pt remained seated in w/c per request, with chair pad alarm on/activated, and with all needs in reach at end of session.   Therapy Documentation Precautions:  Precautions Precautions: Fall Restrictions Weight Bearing Restrictions: Yes LLE Weight Bearing: Touchdown weight bearing    Therapy/Group: Individual Therapy  Blase Mess, MS, OTR/L  04/17/2022, 12:14 PM

## 2022-04-17 NOTE — Inpatient Diabetes Management (Signed)
Inpatient Diabetes Program Recommendations  AACE/ADA: New Consensus Statement on Inpatient Glycemic Control   Target Ranges:  Prepandial:   less than 140 mg/dL      Peak postprandial:   less than 180 mg/dL (1-2 hours)      Critically ill patients:  140 - 180 mg/dL    Latest Reference Range & Units 04/17/22 00:04 04/17/22 01:16 04/17/22 02:11 04/17/22 03:09 04/17/22 06:15 04/17/22 06:56 04/17/22 7:34 04/17/22 08:04  Glucose-Capillary 70 - 99 mg/dL 148 (H)  D50 25 ml @ 00:34 321 (H) 376 (H) 378 (H) 401 (H) 432 (H)  Novolog 2 units '@6'$ :45   Novolog 4 units     Semglee 17 units    Latest Reference Range & Units 04/16/22 21:52 04/16/22 22:02 04/16/22 22:07 04/16/22 22:11 04/16/22 22:31 04/16/22 22:49 04/16/22 23:03  Glucose-Capillary 70 - 99 mg/dL 11 (LL) 17 (LL)  D50 50 ml @ 22:02 334 (H) 186 (H) 169 (H) 142 (H) 134 (H)    Latest Reference Range & Units 04/16/22 02:24 04/16/22 06:06 04/16/22 12:21 04/16/22 16:22 04/16/22 21:10  Glucose-Capillary 70 - 99 mg/dL 122 (H) 109 (H)    Semglee 16 units '@7'$ :48  Ate 50% of meal 369 (H)  Novolog 2 units meal cov '@12'$ :55    Ate 90% of meal 353 (H)  Novolog 7 units (SSI and meal cov) '@18'$ :49    Ate 50% of meal 50 (L)   Review of Glycemic Control  Diabetes history: DM1 (does NOT make any insulin; requires basal, correction, and meal coverage insulin) Outpatient Diabetes medications: OmniPod insulin pump with Humalog, Dexcom CGM Current orders for Inpatient glycemic control: Semglee 17 units daily (increased today), Novolog 2 units daily after lunch, Novolog 0-6 units BID, Novolog 0-5 units QHS  Inpatient Diabetes Program Recommendations:    Insulin: Noted Semglee increased to 17 units today and already given. Would recommend discontinuing Semglee 17 units daily. If patient is appropriate to use her insulin pump, please allow patient to resume using her insulin pump (use Insulin Pump order set with CBGs and insulin pump AC&HS and  2am). If patient is not appropriate to use her insulin pump, change basal to Levemir 7 units BID (to start 04/18/22). Would recommend changing meal coverage to Novolog 2 units TID with meals if patient eats at least 50% of meals.  NURSING: Please administer Novolog meal coverage insulin if patient eats at least 50% of meals. Please be sure to give insulin within 1 hour of CBG being obtained. If insulin given late, will need to recheck CBG prior to giving Novolog.   NOTE:  Noted patient received Novolog 7 units at 18:49 on 04/16/22 for glucose of 353 mg/dl at 16:22 (over 2 hours after CBG obtained).  Per chart, patient sees Dr. Honor Junes with [facility] and was last seen 03/16/22 and the following should be insulin pump settings: Basal rates 12 AM = 0.3 9 AM = 0.5 12 PM = 0.45 6 PM = 0.4 9 PM = 0.2 TDD basal: 8.7 units  Bolus settings I/C: 13. ISF: 60 Target Glucose: 120 Active insulin time: 5 hours   Spoke with Reesa Chew, PA over the phone regarding glucose control over the past 24 hours. Olin Hauser reports that she was notified last night when patient experienced hypoglycemia and was involved in the treatment decision of using D50. Olin Hauser feels that patient is appropriate to resume her insulin pump but nursing staff would need to double check with patient to be sure patient is counting carbs correctly.  Patient has already been given Semglee 17 units this morning so would not be able to resume her insulin pump until 24 hours from last Semglee dose given. Called patient's daughter [name] and she reports that she is very upset and concerned about her mother's glucose control in the hospital. [name] reports that when she spoke with her mother this morning that she informed her that her glucose was down to 11 mg/dl last night and her mother thought she had seizure last night during hypoglycemia event. Discussed glucose trends over past 24 hours and current insulin orders. Explained that patient  has already received Semlgee 17 units this morning at 8:04 am. [name] feels that her mother is appropriate to resume her insulin pump. Patient does not have any pump supplies at the hospital and [name] reports that she will not be able to bring any pump supplies to the hospital until sometime tomorrow later in the day. Discussed that it has been recommended that Semglee be discontinued and to use Levemir (splitting dose) if patient can not resume her pump yet. If Levemir is ordered as recommended, patient could receive morning dose of Levemir and resume her insulin pump tomorrow around 5pm before supper. [name] states she will bring the pump supplies but again it would not be until later in the day tomorrow. [name] expressed her concern of poor glycemic control and very upset about glucose control since insulin pump was removed. Called Reesa Chew, [place] back to discuss concern and conversation with [name]. Plan will be to use Levemir (splitting dose) and patient to resume insulin pump tomorrow afternoon before supper. Also encouraged Olin Hauser to reach out to Dr. Honor Junes to see if he can review glucose trends and provide any further recommendations to help improve glycemic control.  Thanks, Barnie Alderman, RN, MSN, [place] Diabetes Coordinator Inpatient Diabetes Program [phone] (Team Pager from 8am to [place])

## 2022-04-17 NOTE — Progress Notes (Signed)
Occupational Therapy Session Note  Patient Details  Name: Diana Frost MRN: [id number] Date of Birth: [birth year]  Today's Date: 04/17/2022 OT Individual Time: 0832-0900 OT Individual Time Calculation (min): 28 min  and Today's Date: 04/17/2022 OT Missed Time: 32 Minutes Missed Time Reason: Patient fatigue;Patient ill (comment)   Short Term Goals: Week 1:  OT Short Term Goal 1 (Week 1): Pt will complete 1/3 toileting steps with CGA for standing balance using RW OT Short Term Goal 1 - Progress (Week 1): Met OT Short Term Goal 2 (Week 1): Pt will complete toilet transfer with CGA using RW OT Short Term Goal 2 - Progress (Week 1): Met OT Short Term Goal 3 (Week 1): Pt will use AE PRN during LB clothing with min cues needed for technique OT Short Term Goal 3 - Progress (Week 1): Met Week 2:  OT Short Term Goal 1 (Week 2): STG = LTG due to ELOS  Skilled Therapeutic Interventions/Progress Updates:    1:1 Pt resting in bed and report she felt like she was having a seizure last night, vomited and still not feeling well due to issues last night due to her blood sugars. Pt able to get to EOB with min A with more than reasonable amt of time. Pt sat EOB and bathed face and UB to freshen up. Pt able to don shirt with min A for balance due to feeling weak and tired today. As soon as she donned her shirt she requested to lay back down due to fatigue. BP taken and remained the same. Pt's pants donned in supine with total A and TEDS donned. LEft pt resting in bed with call bell and alarm set.   Therapy Documentation Precautions:  Precautions Precautions: Fall Restrictions Weight Bearing Restrictions: Yes LLE Weight Bearing: Touchdown weight bearing General: General OT Amount of Missed Time: 32 Minutes Vital Signs: Therapy Vitals BP: 123/64 Patient Position (if appropriate): Sitting Pain:  No c/o pain but did c/o not feeling well due to blood sugar episodes yesterday and this morning     Therapy/Group: Individual Therapy  Willeen Cass [facility] 04/17/2022, 9:05 AM

## 2022-04-17 NOTE — Progress Notes (Signed)
Occupational Therapy Session Note  Patient Details  Name: Diana Frost MRN: [id number] Date of Birth: Jan 21, 1946  Today's Date: 04/17/2022 OT Individual Time: 1300-1333 OT Individual Time Calculation (min): 33 min    Short Term Goals: Week 1:  OT Short Term Goal 1 (Week 1): Pt will complete 1/3 toileting steps with CGA for standing balance using RW OT Short Term Goal 1 - Progress (Week 1): Met OT Short Term Goal 2 (Week 1): Pt will complete toilet transfer with CGA using RW OT Short Term Goal 2 - Progress (Week 1): Met OT Short Term Goal 3 (Week 1): Pt will use AE PRN during LB clothing with min cues needed for technique OT Short Term Goal 3 - Progress (Week 1): Met  Skilled Therapeutic Interventions/Progress Updates:    Pt received in w/c with no pain   Therapeutic exercise Pt completes w/c propulsion part way to/from gym for activity tolerance improvement.  Pt completes 3x1 min beach ball volley in supported sitting position with 2 # dowel rod for dynamic balance, postural control, BUE strengthening and endurance required for BADLs and functional transfers.  Pt stating, "I can tell I worked my arms earlier because they are sore doing this. But I need to get strong."  Pt reporting fatigeu and requesting to go back to bed d/t having complex night and no sleep.  Pt left at end of session in bed with exit alarm on, call light in reach and all needs met. MD in room discussing insulin administration and new protocols. Pt missed 12 min d/t fatigue.    Therapy Documentation Precautions:  Precautions Precautions: Fall Restrictions Weight Bearing Restrictions: Yes LLE Weight Bearing: Touchdown weight bearing Therapy/Group: Individual Therapy  Tonny Branch 04/17/2022, 12:48 PM

## 2022-04-17 NOTE — Progress Notes (Addendum)
Patient up resting in bed, PRN pain medication given for generalized pain 6/10.  ABD soft non distended, bowel sounds x 4. Encouraged patient to reposition often and encouraged fluids.

## 2022-04-17 NOTE — Progress Notes (Addendum)
4 sutures removed from patients left hip per order. Patient tolerated well, denies pain. PRN Miralax given for no BM. Patient states she has a BM about once a week. Encouraged fluids.

## 2022-04-18 LAB — GLUCOSE, CAPILLARY
Glucose-Capillary: 160 mg/dL — ABNORMAL HIGH (ref 70–99)
Glucose-Capillary: 162 mg/dL — ABNORMAL HIGH (ref 70–99)
Glucose-Capillary: 186 mg/dL — ABNORMAL HIGH (ref 70–99)
Glucose-Capillary: 207 mg/dL — ABNORMAL HIGH (ref 70–99)
Glucose-Capillary: 233 mg/dL — ABNORMAL HIGH (ref 70–99)
Glucose-Capillary: 285 mg/dL — ABNORMAL HIGH (ref 70–99)
Glucose-Capillary: 302 mg/dL — ABNORMAL HIGH (ref 70–99)
Glucose-Capillary: 33 mg/dL — CL (ref 70–99)
Glucose-Capillary: 333 mg/dL — ABNORMAL HIGH (ref 70–99)
Glucose-Capillary: 35 mg/dL — CL (ref 70–99)
Glucose-Capillary: 430 mg/dL — ABNORMAL HIGH (ref 70–99)

## 2022-04-18 LAB — GLUCOSE, RANDOM: Glucose, Bld: 438 mg/dL — ABNORMAL HIGH (ref 70–99)

## 2022-04-18 MED ORDER — INSULIN PUMP
Freq: Three times a day (TID) | SUBCUTANEOUS | Status: DC
  Filled 2022-04-18: qty 1

## 2022-04-18 MED ORDER — DEXTROSE 50 % IV SOLN
INTRAVENOUS | Status: AC
  Administered 2022-04-18: 50 mL
  Filled 2022-04-18: qty 50

## 2022-04-18 NOTE — Progress Notes (Signed)
Hypoglycemic Event  CBG: 35  Treatment: 8 oz juice/soda  Symptoms: None  Follow-up CBG: MBPJ:1216 CBG Result:33  Possible Reasons for Event: Unknown  Comments/MD notified:yes    Diana Frost

## 2022-04-18 NOTE — Progress Notes (Signed)
Hypoglycemic Event  CBG: 33  Treatment: D50 25 mL (12.5 gm)  Symptoms: None  Follow-up CBG: Time:0117 CBG Result:160  Possible Reasons for Event: Unknown  Comments/MD notified:yes    Diana Frost

## 2022-04-18 NOTE — Progress Notes (Signed)
Patient and patients daughter applied insulin pump today at 1130. Patient understands how to apply and program in carbohydrates to get the proper amount of insulin dose. Patient pump delivered 3.45 units of insulin for lunch. Snacks at bedside.

## 2022-04-18 NOTE — Progress Notes (Signed)
Called to patients room. Patient states that her blood sugar is dropping. Per patients glucometer BS is 136. Graham crackers, fruit cup, peanut butter, tuna and ensure given to patient. Patient had few bites of tuna, 1 package of graham crackers and the whole fruit cup. Will recheck patients BS at 4:30. Patient resting in bed.

## 2022-04-18 NOTE — Progress Notes (Signed)
Occupational Therapy Session Note  Patient Details  Name: Diana Frost MRN: [id number] Date of Birth: 15-Sep-1945  Today's Date: 04/18/2022 OT Individual Time: 1305-1400 OT Individual Time Calculation (min): 55 min    Short Term Goals: Week 2:  OT Short Term Goal 1 (Week 2): STG = LTG due to ELOS  Skilled Therapeutic Interventions/Progress Updates:  Skilled OT intervention completed with focus on tub/shower transfer and education, cognitive sequencing/problem solving, dynamic balance. Pt received seated in w/c, agreeable to session. No pain reported.  Pt declined self-care needs as pt reported her daughter helped her get up and get ready for the day this morning as pt did not have therapy scheduled until PM. OT encouraged pt to have staff member present for assist until family education occurs with hands on training with [name], the daughter. OT also suggested that Janet's comfort level with assisting pt at her CLOF might be greater than what was indicated over the phone yesterday due to her willingness to assist pt without staff present.   Transported dependently in w/c > ADL bathroom for time. Pt indicated she has tub shower available at home and at this time this OT advised that this transfer would be more functional/safe for her vs having to step over a threshold. Education provided on DME available such as tub bench that can be purchased for use at home, suggested use of [facility] for ease of bathing, shower curtain management to prevent water spillage, minimizing stands via lateral leans for pericare, use of grab bars/installation as well as effective safety strategies for exiting the shower to eliminate falls. Pt was able to stand using RW with CGA, then CGA stand pivot to TTB. Good TTWB adherence on LLE. Min A needed to get LLE over threshold despite hooking method, but able to get legs out/over the threshold with time and supervision with hook method. CGA sit > stand and stand pivot to w/c  using RW.  Transported in w/c > gym. CGA sit > stand then CGA for balance during the following at BITS: Bells cancellation- 5 mins, 3 misses with seated rest break needed after.  Remained seated for concentration vs dual tasking for the following at [facility] with cues needed for anterior weight shifting and back of back rest: Trail making A- 2 mins, 1 error, supervision Trail making B- 9 mins, 20 errors, max-mod A throughout  Transported dependently in w/c back to room, then remained upright in w/c chair pad alarm on/activated, and with all needs in reach at end of session.   Therapy Documentation Precautions:  Precautions Precautions: Fall Restrictions Weight Bearing Restrictions: Yes LLE Weight Bearing: Touchdown weight bearing    Therapy/Group: Individual Therapy  Blase Mess, MS, OTR/L  04/18/2022, 3:42 PM

## 2022-04-18 NOTE — Inpatient Diabetes Management (Signed)
CBG 285 at 11:50am  Spoke w/ Dr. Joan Mayans by phone earlier this AM about this pt.    Daughter helped pt resume her pump prior to 12pm today.  Per RN, pt currently eating fruit and will let RN know how much insulin she will bolus herself with on the pump.  Asked RN to please check CBG at 2pm and again at 4pm to make sure pt not having Hypoglycemia issues.   Will follow  --Will follow patient during hospitalization--  Wyn Quaker RN, MSN, [place] Diabetes Coordinator Inpatient Glycemic Control Team Team Pager: [phone] (8a-5p)

## 2022-04-18 NOTE — Progress Notes (Signed)
Pt's blood sugar this AM was 430.  This LPN called the on call, Dr. Letta Pate, and he stated to get a glucose random and give her 6 units of insulin.

## 2022-04-18 NOTE — Progress Notes (Signed)
Patients 2 PM BS 207. Patient checked her BS with he glucometer registered 206. Daughter aware

## 2022-04-18 NOTE — Progress Notes (Signed)
PROGRESS NOTE   Subjective/Complaints:  Appreciate DM coordinator note  Spoke with daughter as well as DM nurse coordinator   ROS: pt somnolent but awakens to voice  No pain or breathing c/os Objective:   DG HIP UNILAT WITH PELVIS 2-3 VIEWS LEFT  Result Date: 04/16/2022 CLINICAL DATA:  Pain EXAM: DG HIP (WITH OR WITHOUT PELVIS) 3V LEFT COMPARISON:  None Available. FINDINGS: Osseous structures are osteopenic. Pelvic ring is intact. Lumbosacral degenerative changes identified. Patient is status post bilateral femoral neck ORIF with compression screws. There is grossly anatomic alignment. IMPRESSION: Bilateral femoral neck fractures status post ORIF. Osteopenia. Degenerative changes. Electronically Signed   By: Sammie Bench M.D.   On: 04/16/2022 15:31   No results for input(s): "WBC", "HGB", "HCT", "PLT" in the last 72 hours.    Recent Labs    04/15/22 1937 04/16/22 2235 04/18/22 0649  NA 136  --   --   K 3.6  --   --   CL 98  --   --   CO2 28  --   --   GLUCOSE 223* 142* 438*  BUN 19  --   --   CREATININE 0.95  --   --   CALCIUM 8.7*  --   --        Intake/Output Summary (Last 24 hours) at 04/18/2022 0912 Last data filed at 04/17/2022 1829 Gross per 24 hour  Intake 236 ml  Output --  Net 236 ml         Physical Exam: Vital Signs Blood pressure (!) 113/54, pulse (!) 106, temperature 99.3 F (37.4 C), temperature source Oral, resp. rate 16, height '5\' 2"'$  (1.575 m), weight 50.2 kg, SpO2 94 %.  General: No acute distress Mood and affect are appropriate Heart: Regular rate and rhythm no rubs murmurs or extra sounds Lungs: Clear to auscultation, breathing unlabored, no rales or wheezes Abdomen: Positive bowel sounds, soft nontender to palpation, nondistended Extremities: No clubbing, cyanosis, or edema Skin: No evidence of breakdown, no evidence of rash, left hip incision closed    Neuro:  Follows  commands, normal speech and language, CN 2-12 grossly intact, CN 2-12 grossly intact, improved alertness Sensation intact to LT in all 4 extremities Strength 5/5 in b/l UE Strength 4+/5 in RLE Unable to lift LLE to gravity at hip or knee, strength 4/5 ankle PF and DF Squat pivot transfer CG Musculoskeletal: L hip appropriately tender, appropriate swelling,    Assessment/Plan: 1. Functional deficits which require 3+ hours per day of interdisciplinary therapy in a comprehensive inpatient rehab setting. Physiatrist is providing close team supervision and 24 hour management of active medical problems listed below. Physiatrist and rehab team continue to assess barriers to discharge/monitor patient progress toward functional and medical goals  Care Tool:  Bathing    Body parts bathed by patient: Right arm, Left arm, Chest, Abdomen, Front perineal area, Buttocks, Right upper leg, Left upper leg, Face   Body parts bathed by helper: Right lower leg, Left lower leg     Bathing assist Assist Level: Contact Guard/Touching assist     Upper Body Dressing/Undressing Upper body dressing   What is the patient wearing?:  Pull over shirt    Upper body assist Assist Level: Set up assist    Lower Body Dressing/Undressing Lower body dressing      What is the patient wearing?: Incontinence brief, Pants     Lower body assist Assist for lower body dressing: Minimal Assistance - Patient > 75%     Toileting Toileting    Toileting assist Assist for toileting: Minimal Assistance - Patient > 75%     Transfers Chair/bed transfer  Transfers assist     Chair/bed transfer assist level: Minimal Assistance - Patient > 75%     Locomotion Ambulation   Ambulation assist   Ambulation activity did not occur: Safety/medical concerns (fatigue, poor adherance to TDWB precautions)  Assist level: Contact Guard/Touching assist Assistive device: Walker-rolling Max distance: 39f   Walk 10 feet  activity   Assist  Walk 10 feet activity did not occur: Safety/medical concerns (fatigue, poor adherance to TDWB precautions)  Assist level: Contact Guard/Touching assist Assistive device: Walker-rolling   Walk 50 feet activity   Assist Walk 50 feet with 2 turns activity did not occur: Safety/medical concerns (fatigue, poor adherance to TDWB precautions)         Walk 150 feet activity   Assist Walk 150 feet activity did not occur: Safety/medical concerns (fatigue, poor adherance to TDWB precautions)         Walk 10 feet on uneven surface  activity   Assist Walk 10 feet on uneven surfaces activity did not occur: Safety/medical concerns (fatigue, poor adherance to TDWB precautions)         Wheelchair     Assist Is the patient using a wheelchair?: Yes Type of Wheelchair: Manual    Wheelchair assist level: Supervision/Verbal cueing Max wheelchair distance: 1551f   Wheelchair 50 feet with 2 turns activity    Assist        Assist Level: Supervision/Verbal cueing   Wheelchair 150 feet activity     Assist      Assist Level: Supervision/Verbal cueing   Blood pressure (!) 113/54, pulse (!) 106, temperature 99.3 F (37.4 C), temperature source Oral, resp. rate 16, height '5\' 2"'$  (1.575 m), weight 50.2 kg, SpO2 94 %.  Medical Problem List and Plan: 1. Functional deficits secondary to Left femoral neck fracture s/p percutaneous fixation on 03/29/22             -patient may shower, please cover incision             -ELOS/Goals: 12-14 days , PT/OT supervision to mod I            Continue CIR- PT and OT-   Vitamin D supplement started 2.  Impaired mobility: [place] and is negative for clots. Continue Lovenox             -antiplatelet therapy: ASA '81mg'$  3. Pain from hip fracture: decrease Norco to '5mg'$  q12H prn due to hypotension. Scheduled tylenol increased to 1,'000mg'$  TID.  4. Daytime somnolence: decrease melatonin to '3mg'$  HS 5.  Neuropsych/cognition: This patient is capable of making decisions on her own behalf. 6. Skin/Wound Care: Routine pressure relief measures.  7. Fluids/Electrolytes/Nutrition: Monitor I/O. Check CMET in am.  8. Left subcapital femur Fx s/p fixation: TDWB per Dr. HoEarnestine Leys.T1DM with prior episodes of hypoglycemia: continue omnipod and dexcom --will consult diabetic coordinator to assist with management.    At this point duaghter will review insulin pump instructions with pt this am , place the device before she leaves  at noon. If pt unable to manage will need to cont  novalog and low dose levimir               10. Orthostatic hypotension: abdominal binder ordered. Hydrocodone dose lowered and frequency decreased to qH prn --Also reports of diplopia per optho notes.  - Continue Midodrine for BP support. Compression garments ordered for during therapy. Improved.  11. CAD/Blood pressure: Denies chest pain 12. Depression/anxiety?: Continue Zoloft and Bupropion. Supplement magnesium to goal of 2 13. Acute blood loss anemia: Stable around 9.0. Recheck CBC in am.              -Continue iron supplementation 14. Hypokalemia: Was supplemented X 1 today. K+ 3.4 12/28. Recheck in am.   12/30- K+ 3.5- will recheck Monday 15. Constipation: changed senna to HS  12/30- LBM 2 days ago- is normal for her to go q2-3 days.  16. Fatigue: checked B12 level and is normal 17. Suboptimal magnesium: supplemented 2grams IV on 12/28, 1gm ordered 1/3.  18. Radiation induced nausea: discussed hyperbaric oxygen therapy outpatient. Scheduled compazine prior to breakfast. Resolved.  19. Suboptimal vitamin D level: continue ergocalciferol 50,000U ordered q7 days 20. Tachycardia: EKG ordered and shows sinus tachycardia, discussed with patient. Improved.  21. Lethargy: CBC and BMP reviewed and stable, improved, discussed was likely due to elevated CBG that morning.    LOS: 16 days A FACE TO FACE EVALUATION WAS  PERFORMED  Charlett Blake 04/18/2022, 9:12 AM

## 2022-04-18 NOTE — Inpatient Diabetes Management (Addendum)
Inpatient Diabetes Program Recommendations  AACE/ADA: New Consensus Statement on Inpatient Glycemic Control (2015)  Target Ranges:  Prepandial:   less than 140 mg/dL      Peak postprandial:   less than 180 mg/dL (1-2 hours)      Critically ill patients:  140 - 180 mg/dL    Latest Reference Range & Units 04/17/22 00:04 04/17/22 01:16 04/17/22 02:11 04/17/22 03:09 04/17/22 06:15 04/17/22 06:56 04/17/22 09:45 04/17/22 11:33 04/17/22 16:32  Glucose-Capillary 70 - 99 mg/dL 148 (H) 321 (H) 376 (H) 378 (H) 401 (H) 432 (H)  2 units Novolog '@0645'$   4 units units Novolog '@0734'$   17 units Semglee '@0804'$  118 (H) 141 (H) 270 (H)  3 units Novolog '@1809'$   (H): Data is abnormally high  Latest Reference Range & Units 04/17/22 21:00 04/18/22 00:31 04/18/22 00:49 04/18/22 01:17 04/18/22 03:01 04/18/22 06:14 04/18/22 08:04  Glucose-Capillary 70 - 99 mg/dL 273 (H)  3 units Novolog '@2217'$  35 (LL)  8 oz Juice/Soda given 33 (LL)  50 ml D50% 160 (H) 333 (H) 430 (H)  6 units Novolog '@0640'$  302 (H)    7 units Levemir  (LL): Data is critically low (H): Data is abnormally high   Diabetes history: DM1 (does NOT make any insulin; requires basal, correction, and meal coverage insulin)   Outpatient Meds: OmniPod insulin pump with Humalog, Dexcom CGM   Current orders: Levemir 7 units BID     Novolog 0-6 units TID AC + HS     Note extensive assessment done by the Diabetes RN yesterday 04/17/2022  Pt has been having extremely labile glucose levels  I suspect the Severe Hypoglycemia at [place] last PM was due to being given too much Basal insulin (Semglee) yest AM and perhaps the Novolog she received at 10pm  Pt has now received lower dose of Basal insulin this AM (7 units Levemir) along with 6 units Novolog to correct the CBG at 6am (CBG was 430)  Note plan to have pt resume her home insulin pump once her daughter brings to pump later this afternoon  Recommend we check a CBG between 9-9:30am to  make sure she is stable and please encourage pt to eat her breakfast--Message sent to RN via [place]   MD---  Pt will likely benefit from very low dose Novolog meal coverage at 12pm today with Lunch (Novolog 2 units at [facility] + Correction if needed based on the ordered SSI)  If pump has not arrived by [employer] time, may need to give Novolog meal Coverage with dinner as well  Once Home Insulin Pump is on, Please d/c all SQ Insulin orders and Place the Insulin Pump order set     --Will follow patient during hospitalization--  Wyn Quaker RN, MSN, [place] Diabetes Coordinator Inpatient Glycemic Control Team Team Pager: [phone] (8a-5p)

## 2022-04-19 LAB — GLUCOSE, CAPILLARY
Glucose-Capillary: 118 mg/dL — ABNORMAL HIGH (ref 70–99)
Glucose-Capillary: 144 mg/dL — ABNORMAL HIGH (ref 70–99)
Glucose-Capillary: 181 mg/dL — ABNORMAL HIGH (ref 70–99)
Glucose-Capillary: 117 mg/dL — ABNORMAL HIGH (ref 70–99)
Glucose-Capillary: 142 mg/dL — ABNORMAL HIGH (ref 70–99)

## 2022-04-19 MED ORDER — SORBITOL 70 % SOLN
45.0000 mL | Freq: Once | Status: AC
  Administered 2022-04-19: 45 mL via ORAL
  Filled 2022-04-19: qty 60

## 2022-04-19 NOTE — Progress Notes (Addendum)
Sorbitol 45 ml given as ordered. Patient had emesis x 1 within 10 minutes of taking Sorbitol. Patient agreed to try MOM.  1700 BS 116, no insulin indicated at this time.   MOM given. Patient had emesis x 1. Declines Fleet enema at this time.

## 2022-04-19 NOTE — Progress Notes (Signed)
Lunch BS 181 no insulin indicated at this time per patient

## 2022-04-19 NOTE — Progress Notes (Signed)
Current BS 135. No Insulin administered.

## 2022-04-19 NOTE — Progress Notes (Signed)
PROGRESS NOTE   Subjective/Complaints:  Pt reports LBM 1/3- nursing agrees- pt doesn't  want enema.  But willing to do Sorbitol.   Didn't have low BG's last night- nursing gave her snacks intermittently overnight per pt/nurse.     ROS: limited by sleepiness Objective:   No results found. No results for input(s): "WBC", "HGB", "HCT", "PLT" in the last 72 hours.    Recent Labs    04/16/22 2235 04/18/22 0649  GLUCOSE 142* 438*      Intake/Output Summary (Last 24 hours) at 04/19/2022 1243 Last data filed at 04/19/2022 0941 Gross per 24 hour  Intake 576 ml  Output --  Net 576 ml        Physical Exam: Vital Signs Blood pressure 112/63, pulse 95, temperature 98.4 F (36.9 C), temperature source Oral, resp. rate 18, height '5\' 2"'$  (1.575 m), weight 50.2 kg, SpO2 97 %.   General: woke to stimuli; supine in bed; awake, alert, appropriate, NAD HENT: conjugate gaze; oropharynx a little dry CV: regular to borderline tachycardic rate regular rhythm; no JVD Pulmonary: CTA B/L; no W/R/R- good air movement GI: soft, NT, slightly distended; and protuberant; hypoactive BS Psychiatric: appropriate but sleepy Neurological: sleepy- doesn't want ot wake up Skin: No evidence of breakdown, no evidence of rash, left hip incision closed    Neuro:  Follows commands, normal speech and language, CN 2-12 grossly intact, CN 2-12 grossly intact, improved alertness Sensation intact to LT in all 4 extremities Strength 5/5 in b/l UE Strength 4+/5 in RLE Unable to lift LLE to gravity at hip or knee, strength 4/5 ankle PF and DF Squat pivot transfer CG Musculoskeletal: L hip appropriately tender, appropriate swelling,    Assessment/Plan: 1. Functional deficits which require 3+ hours per day of interdisciplinary therapy in a comprehensive inpatient rehab setting. Physiatrist is providing close team supervision and 24 hour management of  active medical problems listed below. Physiatrist and rehab team continue to assess barriers to discharge/monitor patient progress toward functional and medical goals  Care Tool:  Bathing    Body parts bathed by patient: Right arm, Left arm, Chest, Abdomen, Front perineal area, Buttocks, Right upper leg, Left upper leg, Face   Body parts bathed by helper: Right lower leg, Left lower leg     Bathing assist Assist Level: Contact Guard/Touching assist     Upper Body Dressing/Undressing Upper body dressing   What is the patient wearing?: Pull over shirt    Upper body assist Assist Level: Set up assist    Lower Body Dressing/Undressing Lower body dressing      What is the patient wearing?: Incontinence brief, Pants     Lower body assist Assist for lower body dressing: Minimal Assistance - Patient > 75%     Toileting Toileting    Toileting assist Assist for toileting: Minimal Assistance - Patient > 75%     Transfers Chair/bed transfer  Transfers assist     Chair/bed transfer assist level: Minimal Assistance - Patient > 75%     Locomotion Ambulation   Ambulation assist   Ambulation activity did not occur: Safety/medical concerns (fatigue, poor adherance to TDWB precautions)  Assist level: Contact Guard/Touching  assist Assistive device: Walker-rolling Max distance: 51f   Walk 10 feet activity   Assist  Walk 10 feet activity did not occur: Safety/medical concerns (fatigue, poor adherance to TDWB precautions)  Assist level: Contact Guard/Touching assist Assistive device: Walker-rolling   Walk 50 feet activity   Assist Walk 50 feet with 2 turns activity did not occur: Safety/medical concerns (fatigue, poor adherance to TDWB precautions)         Walk 150 feet activity   Assist Walk 150 feet activity did not occur: Safety/medical concerns (fatigue, poor adherance to TDWB precautions)         Walk 10 feet on uneven surface  activity   Assist  Walk 10 feet on uneven surfaces activity did not occur: Safety/medical concerns (fatigue, poor adherance to TDWB precautions)         Wheelchair     Assist Is the patient using a wheelchair?: Yes Type of Wheelchair: Manual    Wheelchair assist level: Supervision/Verbal cueing Max wheelchair distance: 1521f   Wheelchair 50 feet with 2 turns activity    Assist        Assist Level: Supervision/Verbal cueing   Wheelchair 150 feet activity     Assist      Assist Level: Supervision/Verbal cueing   Blood pressure 112/63, pulse 95, temperature 98.4 F (36.9 C), temperature source Oral, resp. rate 18, height '5\' 2"'$  (1.575 m), weight 50.2 kg, SpO2 97 %.  Medical Problem List and Plan: 1. Functional deficits secondary to Left femoral neck fracture s/p percutaneous fixation on 03/29/22             -patient may shower, please cover incision             -ELOS/Goals: 12-14 days , PT/OT supervision to mod I            Con't CIR- PT and OT  Vitamin D supplement started 2.  Impaired mobility: [place] and is negative for clots. Continue Lovenox             -antiplatelet therapy: ASA '81mg'$  3. Pain from hip fracture: decrease Norco to '5mg'$  q12H prn due to hypotension. Scheduled tylenol increased to 1,'000mg'$  TID.   1/14- pain "tolerable" - con't regimen 4. Daytime somnolence: decrease melatonin to '3mg'$  HS 5. Neuropsych/cognition: This patient is capable of making decisions on her own behalf. 6. Skin/Wound Care: Routine pressure relief measures.  7. Fluids/Electrolytes/Nutrition: Monitor I/O. Check CMET in am.  8. Left subcapital femur Fx s/p fixation: TDWB per Dr. HoEarnestine Leys.T1DM with prior episodes of hypoglycemia: continue omnipod and dexcom --will consult diabetic coordinator to assist with management.    At this point duaghter will review insulin pump instructions with pt this am , place the device before she leaves at noon. If pt unable to manage will need to cont   novalog and low dose levimir              1/14- Cbgs doing OK usually- 1 episode of 400s yesterday AM, but otherwise, looked good in last 24 hours- nursing having to give snacks to get pt to not be hypoglycemic at night.  10. Orthostatic hypotension: abdominal binder ordered. Hydrocodone dose lowered and frequency decreased to qH prn --Also reports of diplopia per optho notes.  - Continue Midodrine for BP support. Compression garments ordered for during therapy. Improved.  11. CAD/Blood pressure: Denies chest pain 12. Depression/anxiety?: Continue Zoloft and Bupropion. Supplement magnesium to goal of 2 13. Acute blood loss anemia:  Stable around 9.0. Recheck CBC in am.              -Continue iron supplementation 14. Hypokalemia: Was supplemented X 1 today. K+ 3.4 12/28. Recheck in am.   12/30- K+ 3.5- will recheck Monday  1/14- K+ 3.6 on 1/10 15. Constipation: changed senna to HS  12/30- LBM 2 days ago- is normal for her to go q2-3 days. 1/14- no BM since 1/3- pt agreed ot take Sorbitol- will give 45cc today- refuses enema of any kind.   16. Fatigue: checked B12 level and is normal 17. Suboptimal magnesium: supplemented 2grams IV on 12/28, 1gm ordered 1/3.  18. Radiation induced nausea: discussed hyperbaric oxygen therapy outpatient. Scheduled compazine prior to breakfast. Resolved.  19. Suboptimal vitamin D level: continue ergocalciferol 50,000U ordered q7 days 20. Tachycardia: EKG ordered and shows sinus tachycardia, discussed with patient. Improved.  21. Lethargy: CBC and BMP reviewed and stable, improved, discussed was likely due to elevated CBG that morning.    I spent a total of 40   minutes on total care today- >50% coordination of care- due to d/w nursing x2 as well as pt about bowels and CBGs.    LOS: 17 days A FACE TO FACE EVALUATION WAS PERFORMED  Diana Frost 04/19/2022, 12:43 PM

## 2022-04-20 DIAGNOSIS — E109 Type 1 diabetes mellitus without complications: Secondary | ICD-10-CM

## 2022-04-20 DIAGNOSIS — Z9641 Presence of insulin pump (external) (internal): Secondary | ICD-10-CM

## 2022-04-20 DIAGNOSIS — K5901 Slow transit constipation: Secondary | ICD-10-CM

## 2022-04-20 LAB — CBC
HCT: 31.7 % — ABNORMAL LOW (ref 36.0–46.0)
Hemoglobin: 10.2 g/dL — ABNORMAL LOW (ref 12.0–15.0)
MCH: 31.8 pg (ref 26.0–34.0)
MCHC: 32.2 g/dL (ref 30.0–36.0)
MCV: 98.8 fL (ref 80.0–100.0)
Platelets: 262 10*3/uL (ref 150–400)
RBC: 3.21 MIL/uL — ABNORMAL LOW (ref 3.87–5.11)
RDW: 14.6 % (ref 11.5–15.5)
WBC: 5.9 10*3/uL (ref 4.0–10.5)
nRBC: 0 % (ref 0.0–0.2)

## 2022-04-20 LAB — BASIC METABOLIC PANEL
Anion gap: 9 (ref 5–15)
BUN: 17 mg/dL (ref 8–23)
CO2: 29 mmol/L (ref 22–32)
Calcium: 8.9 mg/dL (ref 8.9–10.3)
Chloride: 100 mmol/L (ref 98–111)
Creatinine, Ser: 0.83 mg/dL (ref 0.44–1.00)
GFR, Estimated: >60 mL/min (ref >60)
Glucose, Bld: 220 mg/dL — ABNORMAL HIGH (ref 70–99)
Potassium: 3.9 mmol/L (ref 3.5–5.1)
Sodium: 138 mmol/L (ref 135–145)

## 2022-04-20 LAB — GLUCOSE, CAPILLARY
Glucose-Capillary: 126 mg/dL — ABNORMAL HIGH (ref 70–99)
Glucose-Capillary: 222 mg/dL — ABNORMAL HIGH (ref 70–99)
Glucose-Capillary: 238 mg/dL — ABNORMAL HIGH (ref 70–99)
Glucose-Capillary: 140 mg/dL — ABNORMAL HIGH (ref 70–99)
Glucose-Capillary: 168 mg/dL — ABNORMAL HIGH (ref 70–99)

## 2022-04-20 MED ORDER — SENNOSIDES-DOCUSATE SODIUM 8.6-50 MG PO TABS: 2.0000 | ORAL_TABLET | Freq: Two times a day (BID) | ORAL | Status: DC

## 2022-04-20 MED ORDER — SENNOSIDES-DOCUSATE SODIUM 8.6-50 MG PO TABS
2.0000 | ORAL_TABLET | Freq: Two times a day (BID) | ORAL | Status: DC
  Administered 2022-04-20 – 2022-04-23 (×6): 2 via ORAL
  Filled 2022-04-20 (×6): qty 2

## 2022-04-20 NOTE — Progress Notes (Signed)
No bowel movement last night. Patient refused enema. Patient offered warm prune juice with milk of magnesia this morning with result.

## 2022-04-20 NOTE — Progress Notes (Addendum)
Patient ID: [name], female   DOB: 1945/09/15, 77 y.o.   MRN: [id number]  Sw met with patient, daughter during PT session to discuss [place] follow up DME and FU appointments. Sw will provide daughter with a Medicare [place] agency list to review. Sw will add a TTB bench to DME per daughter request. No additional questions or concerns.   Please call Earnestine Leys, MD Ortho office post discharge to arrange follow up appointment at [phone]. [place] [address]

## 2022-04-20 NOTE — Progress Notes (Signed)
Patient ID: Diana Frost, female   DOB: 06-Apr-1946, 77 y.o.   MRN: [id number]   SW received recommendations for [facility] PT OT SLP. BSC and WC & ELR ordered through [employer].

## 2022-04-20 NOTE — Progress Notes (Signed)
Speech Language Pathology Daily Session Note  Patient Details  Name: Diana Frost MRN: [id number] Date of Birth: 1946-03-16  Today's Date: 04/20/2022 SLP Individual Time: 0900-0930 SLP Individual Time Calculation (min): 30 min  Short Term Goals: Week 2: SLP Short Term Goal 1 (Week 2): STG's = LTG's due to ELOS  Skilled Therapeutic Interventions: Skilled treatment session focused on family education with the patient and her daughter. SLP facilitated session by providing education regarding the patient's current cognitive functioning and strategies to utilize at home to maximize recall, attention, problem solving, and overall safety at home. SLP also reinforced the importance of staying cognitively engaged at home and provided a list of functional tasks/activities patient can utilize to do so. Both verbalized understanding and handouts were also given to reinforce information. Patient handed off to PT. Continue with current plan of care.      Pain Pain Assessment Pain Scale: 0-10 Pain Score: 0-No pain  Therapy/Group: Individual Therapy  Bayard More 04/20/2022, 9:46 AM

## 2022-04-20 NOTE — Progress Notes (Signed)
Physical Therapy Session Note  Patient Details  Name: Diana Frost MRN: [id number] Date of Birth: 01-Feb-1946  Today's Date: 04/20/2022 PT Individual Time: [phone] and [id number] PT Individual Time Calculation (min): 58 min and 42 min  Short Term Goals: Week 2:  PT Short Term Goal 1 (Week 2): pt will transfer bed<>chair with LRAD and supervision PT Short Term Goal 1 - Progress (Week 2): Progressing toward goal PT Short Term Goal 2 (Week 2): pt will transfer sit<>stand with LRAD and supervision PT Short Term Goal 2 - Progress (Week 2): Progressing toward goal PT Short Term Goal 3 (Week 2): pt will ambulate 15f with LRAD and supervision while adhering to TDWB precautions PT Short Term Goal 3 - Progress (Week 2): Progressing toward goal Week 3:  PT Short Term Goal 1 (Week 3): STG=LTG due to LOS  Skilled Therapeutic Interventions/Progress Updates:   Treatment Session 1 Received pt semi-reclined in bed with daughter present for family education training. Pt agreeable to PT treatment and denied any pain during session other than when lifting LLE into car. Session with emphasis on discharge planning, functional mobility/transfers, dynamic standing balance/coordination, simulated car transfers, and gait training. Donned ted hose in supine dependently for BP management and pt transferred semi-reclined<>sitting EOB with HOB elevated with supervision (simulated pt's bed at home). Discussed that due to low BP, pt will require compression socks and abdominal binder at discharge.  Donned pants sitting EOB with supervision and increased time and R shoe with max A - informed pt's daughter that this helps pt to maintain LLE TDWB precautions. Pt transferred bed<>WC stand<>pivot with RW and CGA provided by daughter; demonstrating good adherence to LLE TDWB precautions. Educated daughter on proper positioning and hand placement with transfers. [facility] and [facility]  simulated car transfer with RW and CGA provided by daughter - cues to scoot hips further back in seat prior to getting legs in due to increased pain. Pt's daughter demonstrated confidence with WC parts management as well as understanding of appropriate cues to provide to pt (reach back for arm rests prior to sitting, remain closer to RW, and push up from [facility]).   Demonstrated ambulation technique to pt's daughter and pt (again) and pt's daughter practiced with technique to gain better understanding. Pt then stood with RW and CGA and ambulated 338fwith RW and CGA. Pt demonstrating good adherence to TDWB precautions initially, but with increased fatigue began placing more weight on L foot - educated pt's daughter on looking out for this and encouraging pt to sit when she can no longer adhere to precautions. CSW arrived to speak with daughter while pt performed WC pushups x10 with emphasis on UE strength - plan to provide pt with HEP prior to discharge.   Pt performed WC mobility ~7063fsing BUE and supervision back towards room - transported remainder of way dependently due to time restrictions. Concluded session with [facility] and daughter/RN present at bedside. Pt's daughter verbalized and demonstrated confidence with all tasks to ensure safe discharge home.   Treatment Session 2 Received pt sitting in WC, pt agreeable to PT treatment, and denied any pain during session. Session with emphasis on functional mobility/transfers, generalized strengthening and endurance, and gait training. Pt [facility] and [facility] - cues to push up from [facility] and ambulated [id number] RW and CGA with close WC follow. Pt initially demonstrated poor adherence to  LLE TDWB precautions, but with increased cues able to maintain but limited by fatigue overall. Pt performed WC mobility 67f using BUE and increased time with supervision back to room (noted poor carry over with cues for  propulsion technique) and requested to return to bed. Pt transferred WC<>bed stand<>pivot with RW and min A (due to increased posterior lean) and doffed abdominal binder and R shoe with max A. Pt transferred sit<>supine with supervision and performed the following exercises with emphasis on LE strength/ROM: -SLR 2x10 bilaterally  -hip abduction 2x10 bilaterally (decreased ROM on LLE) -alternating hooklying marches 2x20 -hip adduction pillow squeezes 2x10 with 5 second isometric hold Removed ted hose with total A and concluded session with pt semi-reclined in bed, needs within reach, and bed alarm on.   Therapy Documentation Precautions:  Precautions Precautions: Fall Restrictions Weight Bearing Restrictions: Yes LLE Weight Bearing: Touchdown weight bearing   Therapy/Group: Individual Therapy AAlfonse AlpersPT, DPT  04/20/2022, 7:00 AM

## 2022-04-20 NOTE — Progress Notes (Addendum)
Patient ID: Diana Frost, female   DOB: [birth year], 77 y.o.   MRN: [id number]  SW spoke with daughter and provided [facility] agencies. Daughter has made a selection of [facility] for FU. SW will submit referral and follow up with determination. No additional questions or concerns.   3:42PM: Patient approved. Orders sent to [facility] with [facility].

## 2022-04-20 NOTE — Progress Notes (Signed)
PROGRESS NOTE   Subjective/Complaints:  Had bm early this morning and another around 0900. Didn't sleep all that well. Pain is well controlled.   ROS: Patient denies fever, rash, sore throat, blurred vision, dizziness, nausea, vomiting, diarrhea, cough, shortness of breath or chest pain, joint or back/neck pain, headache, or mood change.    Objective:   No results found. Recent Labs    04/20/22 0538  WBC 5.9  HGB 10.2*  HCT 31.7*  PLT 262      Recent Labs    04/18/22 0649 04/20/22 0538  NA  --  138  K  --  3.9  CL  --  100  CO2  --  29  GLUCOSE 438* 220*  BUN  --  17  CREATININE  --  0.83  CALCIUM  --  8.9      Intake/Output Summary (Last 24 hours) at 04/20/2022 1018 Last data filed at 04/20/2022 1700 Gross per 24 hour  Intake 720 ml  Output --  Net 720 ml        Physical Exam: Vital Signs Blood pressure 110/76, pulse 93, temperature 98.4 F (36.9 C), temperature source Oral, resp. rate 18, height '5\' 2"'$  (1.575 m), weight 50.2 kg, SpO2 97 %.   Constitutional: No distress . Vital signs reviewed. HEENT: NCAT, EOMI, oral membranes moist Neck: supple Cardiovascular: RRR without murmur. No JVD    Respiratory/Chest: CTA Bilaterally without wheezes or rales. Normal effort    GI/Abdomen: BS +, non-tender, non-distended Ext: no clubbing, cyanosis, or edema Psych: pleasant and cooperative  Skin: No evidence of breakdown, no evidence of rash, left hip incision closed, clean. Neuro:  Follows commands, normal speech and language, CN 2-12 grossly intact, CN 2-12 grossly intact, improved alertness Sensation intact to LT in all 4 extremities Strength 5/5 in b/l UE Strength 4+/5 in RLE 2-3/5 left hip or knee, strength 4/5 ankle PF and DF Squat pivot transfer CG Musculoskeletal: L hip appropriately tender, appropriate swelling,    Assessment/Plan: 1. Functional deficits which require 3+ hours per day of  interdisciplinary therapy in a comprehensive inpatient rehab setting. Physiatrist is providing close team supervision and 24 hour management of active medical problems listed below. Physiatrist and rehab team continue to assess barriers to discharge/monitor patient progress toward functional and medical goals  Care Tool:  Bathing    Body parts bathed by patient: Right arm, Left arm, Chest, Abdomen, Front perineal area, Buttocks, Right upper leg, Left upper leg, Face   Body parts bathed by helper: Right lower leg, Left lower leg     Bathing assist Assist Level: Contact Guard/Touching assist     Upper Body Dressing/Undressing Upper body dressing   What is the patient wearing?: Pull over shirt    Upper body assist Assist Level: Set up assist    Lower Body Dressing/Undressing Lower body dressing      What is the patient wearing?: Incontinence brief, Pants     Lower body assist Assist for lower body dressing: Minimal Assistance - Patient > 75%     Toileting Toileting    Toileting assist Assist for toileting: Minimal Assistance - Patient > 75%     Transfers  Chair/bed transfer  Transfers assist     Chair/bed transfer assist level: Minimal Assistance - Patient > 75%     Locomotion Ambulation   Ambulation assist   Ambulation activity did not occur: Safety/medical concerns (fatigue, poor adherance to TDWB precautions)  Assist level: Contact Guard/Touching assist Assistive device: Walker-rolling Max distance: 67f   Walk 10 feet activity   Assist  Walk 10 feet activity did not occur: Safety/medical concerns (fatigue, poor adherance to TDWB precautions)  Assist level: Contact Guard/Touching assist Assistive device: Walker-rolling   Walk 50 feet activity   Assist Walk 50 feet with 2 turns activity did not occur: Safety/medical concerns (fatigue, poor adherance to TDWB precautions)         Walk 150 feet activity   Assist Walk 150 feet activity did not  occur: Safety/medical concerns (fatigue, poor adherance to TDWB precautions)         Walk 10 feet on uneven surface  activity   Assist Walk 10 feet on uneven surfaces activity did not occur: Safety/medical concerns (fatigue, poor adherance to TDWB precautions)         Wheelchair     Assist Is the patient using a wheelchair?: Yes Type of Wheelchair: Manual    Wheelchair assist level: Supervision/Verbal cueing Max wheelchair distance: 156f   Wheelchair 50 feet with 2 turns activity    Assist        Assist Level: Supervision/Verbal cueing   Wheelchair 150 feet activity     Assist      Assist Level: Supervision/Verbal cueing   Blood pressure 110/76, pulse 93, temperature 98.4 F (36.9 C), temperature source Oral, resp. rate 18, height '5\' 2"'$  (1.575 m), weight 50.2 kg, SpO2 97 %.  Medical Problem List and Plan: 1. Functional deficits secondary to Left femoral neck fracture s/p percutaneous fixation on 03/29/22             -patient may shower, please cover incision             -ELOS/Goals: 12-14 days , PT/OT supervision to mod I          -Continue CIR therapies including PT, OT  2.  Impaired mobility: [place] and is negative for clots. Continue Lovenox             -antiplatelet therapy: ASA '81mg'$  3. Pain from hip fracture: decrease Norco to '5mg'$  q12H prn due to hypotension. Scheduled tylenol increased to 1,'000mg'$  TID.   1/14- pain "tolerable" - con't regimen 4. Daytime somnolence: decrease melatonin to '3mg'$  HS 5. Neuropsych/cognition: This patient is capable of making decisions on her own behalf. 6. Skin/Wound Care: Routine pressure relief measures.  7. Fluids/Electrolytes/Nutrition: Monitor I/O. Check CMET in am.  8. Left subcapital femur Fx s/p fixation: TDWB per Dr. HoEarnestine Leys.T1DM with prior episodes of hypoglycemia: continue omnipod and dexcom --   At this point duaghter will review insulin pump instructions with pt this am , place the device  before she leaves at noon. CBG (last 3)  Recent Labs    04/19/22 2054 04/20/22 0212 04/20/22 0558  GLUCAP 142* 126* 222*   -improving control. Continue with pump per pt 10. Orthostatic hypotension: abdominal binder ordered. Hydrocodone dose lowered and frequency decreased to qH prn --Also reports of diplopia per optho notes.  - Continue Midodrine for BP support. Compression garments ordered for during therapy.  -appears improved 11. CAD/Blood pressure: Denies chest pain 12. Depression/anxiety?: Continue Zoloft and Bupropion. Supplement magnesium to goal of 2 13.  Acute blood loss anemia: Stable around 9.0. Recheck CBC in am.              -Continue iron supplementation 14. Hypokalemia: Was supplemented X 1 today. K+ 3.4 12/28. Recheck in am.   12/30- K+ 3.5- will recheck Monday  1/14- K+ 3.6 on 1/10 15. Constipation: changed senna to HS  12/30- LBM 2 days ago- is normal for her to go q2-3 days. 1/15 2 large bm's after sorbitol  16. Fatigue: checked B12 level and is normal 17. Suboptimal magnesium: supplemented 2grams IV on 12/28, 1gm ordered 1/3.  18. Radiation induced nausea: discussed hyperbaric oxygen therapy outpatient. Scheduled compazine prior to breakfast. Resolved.  19. Suboptimal vitamin D level: continue ergocalciferol 50,000U ordered q7 days 20. Tachycardia: EKG ordered and shows sinus tachycardia, discussed with patient. Improved.  21. Lethargy: resolved   LOS: 18 days A FACE TO [place] 04/20/2022, 10:18 AM

## 2022-04-20 NOTE — Progress Notes (Signed)
Occupational Therapy Session Note  Patient Details  Name: Diana Frost MRN: [id number] Date of Birth: [birth year]  Today's Date: 04/20/2022 OT Individual Time: 3159-4585 OT Individual Time Calculation (min): 55 min    Short Term Goals: Week 2:  OT Short Term Goal 1 (Week 2): STG = LTG due to ELOS  Skilled Therapeutic Interventions/Progress Updates:     Pt received sitting up in wc with DTR present in room. Pt presenting to be in good spirits and receptive to skilled OT session. Pt and wife motivated for family education session. Beginning of session focused on answering question from DTR regarding DME, follow up services, and Pt supervision. Pt and DTR educated on need for pt to have 24/7 supervision following d/c and HHOT services to continue to increase Pt safety/independence with Pt and DTR receptive to education. Education provided on DME- tub bench and BSC- with emphasis on purpose to keep Pt safe. Pt and DTR educated on fall prevention and energy conservation principles to implement at home following d/c.  Pt and DTR educated on Pt current functional status regarding BADLs and transfer. Pt initially completed transfers with therapist to provided demonstration for tub bench and BSC transfers with hands-on practice following. Dtr provided skilled feedback on body mechanics, safety, technique following practice with DTR demonstrating teach back, competence, and safety awareness following practice. Dtr very supportive of Pt and receptive to all education provided. Pt was left resting in wc with call bell in reach, chair alarm on, and all needs met.   Therapy Documentation Precautions:  Precautions Precautions: Fall Restrictions Weight Bearing Restrictions: Yes LLE Weight Bearing: Touchdown weight bearing General:   Vital Signs: Therapy Vitals Temp: (Abnormal) 97.5 F (36.4 C) Pulse Rate: 97 Resp: 17 BP: 118/69 Patient Position (if appropriate): Sitting Oxygen Therapy SpO2:  100 % O2 Device: Room Air Pain:   ADL: ADL Eating: Set up Where Assessed-Eating: Wheelchair Grooming: Setup Where Assessed-Grooming: Wheelchair Upper Body Bathing: Supervision/safety Where Assessed-Upper Body Bathing: Wheelchair (simulated) Lower Body Bathing: Maximal assistance (simulated) Where Assessed-Lower Body Bathing: Wheelchair Upper Body Dressing: Supervision/safety Where Assessed-Upper Body Dressing: Wheelchair Lower Body Dressing: Dependent (simulated) Where Assessed-Lower Body Dressing: Edge of bed Toileting: Dependent Where Assessed-Toileting: Bed level Toilet Transfer: Moderate assistance Toilet Transfer Method: Stand pivot Tub/Shower Transfer: Unable to assess Tub/Shower Transfer Method: Unable to assess Gaffer Transfer: Unable to assess [employer] Method: Unable to assess   Therapy/Group: Individual Therapy  [name] 04/20/2022, 4:28 PM

## 2022-04-21 LAB — GLUCOSE, CAPILLARY
Glucose-Capillary: 128 mg/dL — ABNORMAL HIGH (ref 70–99)
Glucose-Capillary: 138 mg/dL — ABNORMAL HIGH (ref 70–99)
Glucose-Capillary: 196 mg/dL — ABNORMAL HIGH (ref 70–99)
Glucose-Capillary: 251 mg/dL — ABNORMAL HIGH (ref 70–99)
Glucose-Capillary: 191 mg/dL — ABNORMAL HIGH (ref 70–99)

## 2022-04-21 MED ORDER — HYDROCODONE-ACETAMINOPHEN 5-325 MG PO TABS: 1.0000 | ORAL_TABLET | Freq: Every day | ORAL | Status: DC | PRN

## 2022-04-21 NOTE — Progress Notes (Signed)
Physical Therapy Session Note  Patient Details  Name: Diana Frost MRN: [id number] Date of Birth: May 21, 1945  Today's Date: 04/21/2022 PT Individual Time: 2878-6767 PT Individual Time Calculation (min): 70 min   Short Term Goals: Week 2:  PT Short Term Goal 1 (Week 2): pt will transfer bed<>chair with LRAD and supervision PT Short Term Goal 1 - Progress (Week 2): Progressing toward goal PT Short Term Goal 2 (Week 2): pt will transfer sit<>stand with LRAD and supervision PT Short Term Goal 2 - Progress (Week 2): Progressing toward goal PT Short Term Goal 3 (Week 2): pt will ambulate 15f with LRAD and supervision while adhering to TDWB precautions PT Short Term Goal 3 - Progress (Week 2): Progressing toward goal Week 3:  PT Short Term Goal 1 (Week 3): STG=LTG due to LOS  Skilled Therapeutic Interventions/Progress Updates:   Received pt semi-reclined in bed, pt agreeable to PT treatment, and reported "very little" pain and soreness in L leg with mobility. Session with emphasis on functional mobility/transfers, dressing, generalized strengthening and endurance, dynamic standing balance/coordination, and gait training. Donned ted hose in supine with total A and pt transferred semi-reclined<>sitting EOB with HOB elevated and supervision. Doffed dirty shirt and donned clean one with supervision. Donned abdominal binder, R tennis shoe, and L slipper sock with max A. Pt stood from EOB with RW and CGA and required min A for balance while pulling pants over hips due to posterior lean. Pt transferred bed<>WC stand<>pivot with RW and CGA while demonstrating good adherence to LLE TDWB precautions. Pt sat in [place] and brushed teeth, washed face, and combed hair with set up assist. Went through sensation, MMT, and pain interference questionnaire.   Provided pt with HEP and educated pt on frequency/duration/technique for the following exercises: - Wheelchair Push-Up - 1 x daily - 7 x weekly - 3 sets  - 10 reps - Seated Long Arc Quad  - 1 x daily - 7 x weekly - 3 sets - 10 reps - Seated March  - 1 x daily - 7 x weekly - 3 sets - 10 reps - Seated Hip Adduction Isometrics with Ball  - 1 x daily - 7 x weekly - 3 sets - 10 reps - 5 hold - Seated Hip Abduction with Resistance  - 1 x daily - 7 x weekly - 3 sets - 10 reps - Active Straight Leg Raise with Quad Set  - 1 x daily - 7 x weekly - 3 sets - 10 reps  Pt performed WC mobility 1530fusing BUE and supervision with significantly increased time and cues for propulsion technique; then transported remainder of way [facility] due to fatigue. Pt stood x 2 reps from [facility]St[facility]ith R[facility] and CGA/supervision with cues to push up from [facility]rests and ambulated [id number] 1 and [id number] with RW and CGA fading to close supervision with close WC follow. Of note, pt demonstrates improvements with adherence to LLE TDWB precautions with visual demonstration prior to ambulating. PA, Pam, present for brief assessment, then pt transported back to [facility]. Concluded session with pt sitting in [facility] all needs within reach.   Therapy Documentation Precautions:  Precautions Precautions: Fall Restrictions Weight Bearing Restrictions: No LLE Weight Bearing: Touchdown weight bearing  Therapy/Group: Individual Therapy AnnaAlfonse Alpers DPT  04/21/2022, 7:04 AM

## 2022-04-21 NOTE — Progress Notes (Signed)
Occupational Therapy Session Note  Patient Details  Name: Diana Frost MRN: [id number] Date of Birth: 1945-08-14  Today's Date: 04/21/2022 OT Individual Time: 1120-1200 & 1417-1530 OT Individual Time Calculation (min): 40 min & 73 min   Short Term Goals: Week 2:  OT Short Term Goal 1 (Week 2): STG = LTG due to ELOS  Skilled Therapeutic Interventions/Progress Updates:  Session 1 Skilled OT intervention completed with focus on insulin pump management, BUE endurance. Pt received seated in w/c, agreeable to session. Soreness but no pain reported in bilateral shoulders; pre-medicated. OT offered rest breaks and repositioning throughout for pain reduction.   Pt reported her insulin pump pod was expired and needed to be changed. Nursing notified for supervision, however pt able to manage independently with pt actually teaching nurse and OT each step with accurate recall; memory has improved since admission as pt had trouble with this in the beginning.   Transported dependently in w/c <> gym for time. Pt's daughter requested that pt be sent home with HEP for BUE strengthening therefore rest of session focused on exercises and education on HEP regarding the following: (With yellow theraband) x12 reps Horizontal abduction Self-anchored shoulder flexion each arm Self-anchored bicep flexion each arm Self-anchored tricep extension each arm Therapist anchored scapular retraction each arm Alternating chest presses Shoulder external rotation Shoulder extension Shoulder diagonal pulls -Cues needed for form and technique with red band issued as well for progression of strength as time progresses at home.  Pt remained seated in w/c, with direct care hand off to NT present to assess blood sugar.  Session 2 Skilled OT intervention completed with focus on functional endurance, safety education and ADL retraining within a shower context. Pt received seated upright in bed, agreeable to session. No  pain reported.  Pt had just woken from a nap, therefore OT asked pt to check her CBG on her Dexcom since it had been a few hours after lunch. CBG reading indicated 376. Pt indicated she needed to give herself 4.2 units of insulin via the pump, with nursing notified of pt status and record of her insulin.  Pt transitioned to EOB with supervision with heavy use of bed rails. Completed CGA sit > stand and stand pivot using RW, with cues needed for TDWB on LLE. Pt self-propelled in w/c to pick out her own clothing, and then dependent transport into bathroom to tub bench. Cues needed for hand placement and locking w/c breaks prior to standing. CGA stand pivot using grab bar to tub bench with pt able to doff brief/pants in stance with min A.   Waterproof cover applied to IV on LUE. Completed bathing with supervision including at the sit > stand level with no LOB however cues needed for TDWB on LLE while dual tasking with washing peri-areas. No verbalized dizziness or BP symptoms throughout session. Pt did require safety cues on 2 occasions as pt would stand without therapist readiness or OT supervision in prep for transfer, and education was provided on waiting for supervision from daughter especially while at home. CGA stand pivot to w/c. Donned shirt with set up A, a pre-threaded brief with supervision (though increased time due to lack of recall for donning weaker side first) and then pants with good recall of L side first. CGA sit > stand using sink for balance with cues needed for using one hand on sink (CGA) vs taking both hands off (min A) due to increased safety issue with balance. Hair grooming with set up  A.  Assessed pt's BP as pt did not wear TEDS/binder for duration of session with the following readings: -after shower, seated; 125/73; asymptomatic -in stance; 82/53; asymptomatic -seated after 3 min rest break; 133/69; asymptomatic Discussed ways to manage BP during shower/ADL when binder and TEDS  are not on to prevent orthostatic hypotension and risks for fainting with pt receptive.  Completed CGA sit > stand and stand pivot to EOB using RW, then supervision bed mobility to upright in bed. Pt remained upright in bed, with bed alarm on/activated, and with all needs in reach at end of session.   Therapy Documentation Precautions:  Precautions Precautions: Fall Restrictions Weight Bearing Restrictions: No LLE Weight Bearing: Touchdown weight bearing    Therapy/Group: Individual Therapy  Blase Mess, MS, OTR/L  04/21/2022, 3:51 PM

## 2022-04-21 NOTE — Progress Notes (Signed)
PROGRESS NOTE   Subjective/Complaints: Pain well controlled, says she does not like to use pain medications much Commended on her excellent control of her blood sugars!  ROS: Patient denies fever, rash, sore throat, blurred vision, dizziness, nausea, vomiting, diarrhea, cough, shortness of breath or chest pain, headache, or mood change. Hip pain is well controlled   Objective:   No results found. Recent Labs    04/20/22 0538  WBC 5.9  HGB 10.2*  HCT 31.7*  PLT 262      Recent Labs    04/20/22 0538  NA 138  K 3.9  CL 100  CO2 29  GLUCOSE 220*  BUN 17  CREATININE 0.83  CALCIUM 8.9      Intake/Output Summary (Last 24 hours) at 04/21/2022 0942 Last data filed at 04/21/2022 0721 Gross per 24 hour  Intake 780 ml  Output --  Net 780 ml        Physical Exam: Vital Signs Blood pressure 102/69, pulse 91, temperature 98.1 F (36.7 C), temperature source Oral, resp. rate 18, height '5\' 2"'$  (1.575 m), weight 50.2 kg, SpO2 98 %.   Constitutional: No distress . Vital signs reviewed. HEENT: NCAT, EOMI, oral membranes moist Neck: supple Cardiovascular: RRR without murmur. No JVD    Respiratory/Chest: CTA Bilaterally without wheezes or rales. Normal effort    GI/Abdomen: BS +, non-tender, non-distended Ext: no clubbing, cyanosis, or edema Psych: pleasant and cooperative  Skin: No evidence of breakdown, no evidence of rash, left hip incision closed, clean. Neuro:  Follows commands, normal speech and language, CN 2-12 grossly intact, CN 2-12 grossly intact, improved alertness Sensation intact to LT in all 4 extremities Strength 5/5 in b/l UE Strength 4+/5 in RLE 2-3/5 left hip or knee, strength 4/5 ankle PF and DF Squat pivot transfer CG Musculoskeletal: L hip appropriately tender, appropriate swelling Donning pants with supervision    Assessment/Plan: 1. Functional deficits which require 3+ hours per day of  interdisciplinary therapy in a comprehensive inpatient rehab setting. Physiatrist is providing close team supervision and 24 hour management of active medical problems listed below. Physiatrist and rehab team continue to assess barriers to discharge/monitor patient progress toward functional and medical goals  Care Tool:  Bathing    Body parts bathed by patient: Right arm, Left arm, Chest, Abdomen, Front perineal area, Buttocks, Right upper leg, Left upper leg, Face   Body parts bathed by helper: Right lower leg, Left lower leg     Bathing assist Assist Level: Contact Guard/Touching assist     Upper Body Dressing/Undressing Upper body dressing   What is the patient wearing?: Pull over shirt    Upper body assist Assist Level: Set up assist    Lower Body Dressing/Undressing Lower body dressing      What is the patient wearing?: Incontinence brief, Pants     Lower body assist Assist for lower body dressing: Minimal Assistance - Patient > 75%     Toileting Toileting    Toileting assist Assist for toileting: Minimal Assistance - Patient > 75%     Transfers Chair/bed transfer  Transfers assist     Chair/bed transfer assist level: Contact Guard/Touching assist  Locomotion Ambulation   Ambulation assist   Ambulation activity did not occur: Safety/medical concerns (fatigue, poor adherance to TDWB precautions)  Assist level: Contact Guard/Touching assist Assistive device: Walker-rolling Max distance: 56f   Walk 10 feet activity   Assist  Walk 10 feet activity did not occur: Safety/medical concerns (fatigue, poor adherance to TDWB precautions)  Assist level: Contact Guard/Touching assist Assistive device: Walker-rolling   Walk 50 feet activity   Assist Walk 50 feet with 2 turns activity did not occur: Safety/medical concerns (fatigue, poor adherance to TDWB precautions)         Walk 150 feet activity   Assist Walk 150 feet activity did not  occur: Safety/medical concerns (fatigue, poor adherance to TDWB precautions)         Walk 10 feet on uneven surface  activity   Assist Walk 10 feet on uneven surfaces activity did not occur: Safety/medical concerns (fatigue, poor adherance to TDWB precautions)         Wheelchair     Assist Is the patient using a wheelchair?: Yes Type of Wheelchair: Manual    Wheelchair assist level: Supervision/Verbal cueing Max wheelchair distance: 1558f   Wheelchair 50 feet with 2 turns activity    Assist        Assist Level: Supervision/Verbal cueing   Wheelchair 150 feet activity     Assist      Assist Level: Supervision/Verbal cueing   Blood pressure 102/69, pulse 91, temperature 98.1 F (36.7 C), temperature source Oral, resp. rate 18, height '5\' 2"'$  (1.575 m), weight 50.2 kg, SpO2 98 %.  Medical Problem List and Plan: 1. Functional deficits secondary to Left femoral neck fracture s/p percutaneous fixation on 03/29/22             -patient may shower, please cover incision             -ELOS/Goals: 12-14 days , PT/OT supervision to mod I          -Continue CIR therapies including PT, OT  2.  Impaired mobility: [place] and is negative for clots. Continue Lovenox             -antiplatelet therapy: ASA '81mg'$  3. Pain from hip fracture: decrease Norco to '5mg'$  q12H prn due to hypotension. Scheduled tylenol increased to 1,'000mg'$  TID.   1/14- pain "tolerable" - con't regimen 4. Daytime somnolence: decrease melatonin to '3mg'$  HS 5. Neuropsych/cognition: This patient is capable of making decisions on her own behalf. 6. Skin/Wound Care: Routine pressure relief measures.  7. Fluids/Electrolytes/Nutrition: Monitor I/O. Check CMET in am.  8. Left subcapital femur Fx s/p fixation: TDWB per Dr. HoEarnestine Leys.T1DM with prior episodes of hypoglycemia: continue omnipod and dexcom --   At this point duaghter will review insulin pump instructions with pt this am , place the device  before she leaves at noon. CBG (last 3)  Recent Labs    04/20/22 2200 04/21/22 0219 04/21/22 0616  GLUCAP 168* 128* 138*   -improving control. Continue with pump per pt 10. Orthostatic hypotension: abdominal binder ordered. Hydrocodone dose lowered and frequency decreased to qH prn --Also reports of diplopia per optho notes.  - Continue Midodrine for BP support. Compression garments ordered for during therapy.  -appears improved 11. CAD/Blood pressure: Denies chest pain 12. Depression/anxiety?: Continue Zoloft and Bupropion. Supplement magnesium to goal of 2 13. Acute blood loss anemia: Stable around 9.0. Recheck CBC in am.              -  Continue iron supplementation 14. Hypokalemia: Was supplemented X 1 today. K+ 3.4 12/28. Recheck in am.   12/30- K+ 3.5- will recheck Monday  1/14- K+ 3.6 on 1/10 15. Constipation: changed senna to HS  12/30- LBM 2 days ago- is normal for her to go q2-3 days. 1/15 2 large bm's after sorbitol  16. Fatigue: checked B12 level and is normal 17. Suboptimal magnesium: supplemented 2grams IV on 12/28, 1gm ordered 1/3.  18. Radiation induced nausea: discussed hyperbaric oxygen therapy outpatient. Scheduled compazine prior to breakfast. Resolved.  19. Suboptimal vitamin D level: continue ergocalciferol 50,000U ordered q7 days 20. Tachycardia: EKG ordered and shows sinus tachycardia, discussed with patient. Improved.  21. Lethargy: resolved   LOS: 19 days A FACE TO FACE EVALUATION WAS PERFORMED  Diana Frost 04/21/2022, 9:42 AM

## 2022-04-22 LAB — GLUCOSE, CAPILLARY
Glucose-Capillary: 200 mg/dL — ABNORMAL HIGH (ref 70–99)
Glucose-Capillary: 155 mg/dL — ABNORMAL HIGH (ref 70–99)
Glucose-Capillary: 231 mg/dL — ABNORMAL HIGH (ref 70–99)
Glucose-Capillary: 363 mg/dL — ABNORMAL HIGH (ref 70–99)
Glucose-Capillary: 138 mg/dL — ABNORMAL HIGH (ref 70–99)

## 2022-04-22 NOTE — Group Note (Signed)
Patient Details Name: Diana Frost MRN: [id number] DOB: Aug 01, 1945 Today's Date: 04/22/2022 Time: 245-330    Group Description: Stress management: Pt participated in group session with a focus on stress mgmt, education provided on healthy coping strategies, and social interaction. Focus of session on providing coping strategies to manage new diagnosis to allow for improved mental health to increase overall quality of life . Discussed how to break down stressors into "daily hassles," "major life stressors" and "life circumstances" in an effort to allow pts to chunk their stressors into groups and determine where to best put their efforts/time when dealing with stress. Provided active listening, emotional support and therapeutic use of self. Offered education on factors that protect [name] against stress such as "daily uplifts," "healthy coping strategies" and "protective factors." Encouraged all group members to make an effort to actively recall one event from their day that was a daily uplift in an effort to protect their mindset from stressors as well as sharing this information with their caregivers to facilitate improved caregiver communication and decrease overall burden of care.  Issued pt handouts on healthy coping strategies to implement into routine.   Individual level documentation: Patient participated with full collaboration during session.   Pain:  No c/o   Diana Frost 04/22/2022, 3:56 PM

## 2022-04-22 NOTE — Progress Notes (Signed)
Occupational Therapy Session Note  Patient Details  Name: Diana Frost MRN: [id number] Date of Birth: [birth year]  Today's Date: 04/22/2022 OT Individual Time: 4098-1191 OT Individual Time Calculation (min): 40 min    Short Term Goals: Week 2:  OT Short Term Goal 1 (Week 2): STG = LTG due to ELOS  Skilled Therapeutic Interventions/Progress Updates:  Skilled OT intervention completed with focus on ADL retraining, functional transfers, and d/c planning. Pt received in R side-lying asleep, easily woken and agreeable to session. Un-rated pain reported in L hip- nursing made aware of medication request. OT offered rest breaks and repositioning throughout for pain reduction.  Cues needed for pt to check blood sugar at start of session due to indeterminate knowledge of how long pt had been asleep without checking her Dexcom. Per reading at 7:52 AM; 153 CBG. Then pt's daughter called shortly into session stating that her monitor she uses to check on pt indicated 109 reading with pt's Dexcom showing no alert about low CBG; nurse notified of status and of OT getting pt graham crackers and peanut butter for blood sugar management.  Transitioned to EOB with HOB elevated, and increased time but supervision A. Pt declined toileting as she toileted via bed pan prior to OT arrival. Reviewed with pt her plans for toileting at home via [facility] vs bed pan and ways to manage urgency for incontinence management.   Donned knee high TEDS with total A for time, then threaded pants with supervision; good recall of L side first. Donned R shoe in prep for stand with min A, then CGA sit > stand using RW with cues needed for keeping heel up to maintain TTWB. Supervision stand pivot to w/c. Seated at sink, pt completed oral care, facial hygiene and grooming with set up A.  Pt remained seated in w/c, with chair alarm on/activated, eating her snack for blood sugar control and with all needs in reach at end of  session.   Therapy Documentation Precautions:  Precautions Precautions: Fall Restrictions Weight Bearing Restrictions: Yes LLE Weight Bearing: Touchdown weight bearing    Therapy/Group: Individual Therapy  Blase Mess, MS, OTR/L  04/22/2022, 9:58 AM

## 2022-04-22 NOTE — Progress Notes (Signed)
Patient ID: Diana Frost, female   DOB: 09/13/1945, 77 y.o.   MRN: [id number]  Adapt contact information provided to pt's daughter to may co-payment and process DME.

## 2022-04-22 NOTE — Progress Notes (Addendum)
Speech Language Pathology Discharge Summary  Patient Details  Name: Diana Frost MRN: [id number] Date of Birth: 1945-11-12  Date of Discharge from [place] service:April 22, 2022  Today's Date: 04/22/2022    Patient has met 2 of 2 long term goals.  Patient to discharge at overall Supervision;Min level.  Reasons goals not met: N/A   Clinical Impression/Discharge Summary:    Pt has demonstrated slow progress towards meeting long-term cognitive goals outlined in care plan. At time of discharge from [place] intervention, pt currently participating in functional recall at the Sup A level, and able to complete basic sequencing and verbal problem-solving tasks with minimal assistance. Pt and family education completed re: living a brain healthy lifestyle and compensatory memory strategies. Pt effectively utilizing external aids to monitor blood glucose levels via application on smart phone with set-up assistance.   Upon re-administration of various subtest's from the COGNISTAT, pt achieved a score of 11 out of 12 on the orientation subtest [facility]), 11 out of 12 on the delayed recall subtest [facility]), 5 out of 8 on the verbal abstract reasoning subtest [facility]), and a 4 out of 6 on the verbal judgment subtest [facility]). In comparison to performance on 04/09/2022 in which pt score within the mildly impaired range re: verbal abstract reasoning, verbal judgment, and delayed recall; borderline impairment in orientation.   Prior to admission, all IADL tasks were completed by pt's daughter to include medication and financial management, cooking (other than light/simple meal prep), driving, and scheduling appointments. Recommend 24/7 supervision and assistance at time of d/c; however, do not anticipate need for ST intervention at next venue of care given it appears that pt's cognitive skills are consistent with baseline functioning at time of discharge.   Care Partner:  Caregiver Able to Provide Assistance: Yes  Type of  Caregiver Assistance: Physical;Cognitive  Recommendation:  24 hour supervision/assistance      Equipment: N/A   Reasons for discharge: Discharged from hospital   Patient/Family Agrees with Progress Made and Goals Achieved: Yes    Yazmyne Sara A Sue Mcalexander 04/22/2022, 8:06 PM

## 2022-04-22 NOTE — Plan of Care (Signed)
  Problem: RH Problem Solving Goal: LTG Patient will demonstrate problem solving for (SLP) Description: LTG:  Patient will demonstrate problem solving for basic/complex daily situations with cues  (SLP) Outcome: Completed/Met   Problem: RH Memory Goal: LTG Patient will use memory compensatory aids to (SLP) Description: LTG:  Patient will use memory compensatory aids to recall biographical/new, daily complex information with cues (SLP) Outcome: Completed/Met   

## 2022-04-22 NOTE — Progress Notes (Signed)
Inpatient Rehabilitation Care Coordinator Discharge Note   Patient Details  Name: LAQUANDRA CARRILLO MRN: [id number] Date of Birth: 09-22-1945   Discharge location: Home with daughter  Length of Stay: 21 Days  Discharge activity level: Sup/cga  Home/community participation: daughter  Patient response GE:XBMWUX Literacy - How often do you need to have someone help you when you read instructions, pamphlets, or other written material from your doctor or pharmacy?: Often  Patient response LK:GMWNUU Isolation - How often do you feel lonely or isolated from those around you?: Never  Services provided included: SW, Neuropsych, Pharmacy, CM, TR, RN, SLP, OT, PT, RD, MD  Financial Services:  Financial Services Utilized: Medicare    Choices offered to/list presented to: patient and daughter  Follow-up services arranged:  [place]: [facility]         Patient response to transportation need: Is the patient able to respond to transportation needs?: Yes In the past 12 months, has lack of transportation kept you from medical appointments or from getting medications?: No In the past 12 months, has lack of transportation kept you from meetings, work, or from getting things needed for daily living?: No    Comments (or additional information):  Patient/Family verbalized understanding of follow-up arrangements:  Yes  Individual responsible for coordination of the follow-up plan: [name] (daughter)  Confirmed correct DME delivered: [name] 04/22/2022    [name]

## 2022-04-22 NOTE — Group Note (Addendum)
Patient Details Name: Diana Frost MRN: [id number] DOB: Mar 11, 1946 Today's Date: 04/22/2022  Time Calculation: OT Group Time Calculation OT Group Start Time: 6701 OT Group Stop Time: 4103 OT Group Time Calculation (min): 45 [id number]      Group Description: Stress management: Pt participated in group session with a focus on stress mgmt, education provided on healthy coping strategies, and social interaction. Focus of session on providing coping strategies to manage new diagnosis to allow for improved mental health to increase overall quality of life . Discussed how to break down stressors into "daily hassles," "major life stressors" and "life circumstances" in an effort to allow pts to chunk their stressors into groups and determine where to best put their efforts/time when dealing with stress. Provided active listening, emotional support and therapeutic use of self. Offered education on factors that protect [name] against stress such as "daily uplifts," "healthy coping strategies" and "protective factors." Encouraged all group members to make an effort to actively recall one event from their day that was a daily uplift in an effort to protect their mindset from stressors as well as sharing this information with their caregivers to facilitate improved caregiver communication and decrease overall burden of care.  Issued pt handouts on healthy coping strategies to implement into routine.   Individual level documentation: Patient participated with full collaboration during session.   Pain: 0/10    Precautions: TTWB LLE    [name] 04/22/2022, 4:11 PM

## 2022-04-22 NOTE — Progress Notes (Signed)
Physical Therapy Discharge Summary  Patient Details  Name: Diana Frost MRN: [id number] Date of Birth: 12-Jan-1946  Date of Discharge from PT service:April 22, 2021  Today's Date: 04/22/2022 PT Individual Time: 1030-1125 PT Individual Time Calculation (min): 55 min   Patient has met 9 of 9 long term goals due to improved activity tolerance, improved balance, improved postural control, increased strength, decreased pain, ability to compensate for deficits, improved awareness, and improved coordination. Patient to discharge at a wheelchair level Supervision using RW. Patient's care partner is independent to provide the necessary physical and cognitive assistance at discharge. Pt's daughter attended family education training on 1/5 and verbalized and demonstrated confidence with all tasks to ensure safe discharge home.   All goals met   Recommendation:  Patient will benefit from ongoing skilled PT services in home health setting to continue to advance safe functional mobility, address ongoing impairments in transfers, generalized strengthening and endurance dynamic standing balance/coordination, gait training, adherence to precautions, and to minimize fall risk.  Equipment: 16x16 manual WC with L elevating legrest  Reasons for discharge: treatment goals met  Patient/family agrees with progress made and goals achieved: Yes  Today's Interventions: Received pt sitting in WC without abdominal binder on Pt agreeable to PT treatment and denied any pain during session, other than when practicing car transfer. Session with emphasis on functional mobility/transfers, generalized strengthening and endurance, dynamic standing balance/coordination, simulated car transfers, and gait training. Pt transported to/from room in [facility] dependently for time management purposes. Pt performed simulated car transfer with RW and close supervision demonstrating good adherence to TDWB precautions, but reporting  increased L hip pain. Upon returning to [facility], pt demonstrated posterior LOB backwards into WC - reminded pt importance of reaching back prior to sitting. In dayroom, pt stood with RW and close supervision and used reacher to pick up tissue box from floor with supervision and increased time. Stood again with RW and supervision and ambulated 4f x 1 and 352fx 1 with RW and close supervision. Pt with 1 instance of posterior LOB requiring min A to correct - educated pt on decreasing cadence for safety. Pt then performed seated WC pushups 4x5, LAQ 3x10 bilaterally with 1.5lb ankle weight, and hip flexion 3x10 on RLE and 2x10 on LLE with 1.5lb ankle weight on RLE only during active rest break. Returned to room and concluded session with [facility] all needs within reach.   PT Discharge Precautions/Restrictions Precautions Precautions: Fall Restrictions Weight Bearing Restrictions: Yes LLE Weight Bearing: Touchdown weight bearing Pain Interference Pain Interference Pain Effect on Sleep: 0. Does not apply - I have not had any pain or hurting in the past 5 days Pain Interference with Therapy Activities: 0. Does not apply - I have not received rehabilitationtherapy in the past 5 days Pain Interference with Day-to-Day Activities: 1. Rarely or not at all Cognition Overall Cognitive Status: Impaired/Different from baseline Arousal/Alertness: Awake/alert Orientation Level: Oriented X4 Memory: Impaired Awareness: Appears intact Problem Solving: Impaired Safety/Judgment: Appears intact Comments: continued difficulty with LLE TDWB precautions - improved since eval Sensation Sensation Light Touch: Appears Intact Hot/Cold: Appears Intact (pt sensitive to hot tempatures on bilateral feet and legs) Proprioception: Appears Intact Coordination Gross Motor Movements are Fluid and Coordinated: No Fine Motor Movements are Fluid and Coordinated: Yes Coordination and Movement Description: altered  balance strateiges 2/2 LLE TDWB precautions, generalized weakness, and deconditioning - improved since eval Finger Nose Finger Test: [facility] slow Heel Shin Test: [facility]olf[facility]ilaterally Motor  Motor Motor: Other (comment) Motor - Skilled Clinical Observations: altered balance strateiges 2/2 LLE TDWB precautions, generalized weakness, and deconditioning - improved since eval  Mobility Bed Mobility Bed Mobility: Rolling Right;Rolling Left;Sit to Supine;Supine to Sit Rolling Right: Supervision/verbal cueing Rolling Left: Supervision/Verbal cueing Supine to Sit: Supervision/Verbal cueing Sit to Supine: Supervision/Verbal cueing Transfers Transfers: Sit to Stand;Stand to Sit;Stand Pivot Transfers Sit to Stand: Supervision/Verbal cueing Stand to Sit: Supervision/Verbal cueing Stand Pivot Transfers: Supervision/Verbal cueing Stand Pivot Transfer Details: Verbal cues for precautions/safety;Verbal cues for safe use of DME/AE Stand Pivot Transfer Details (indicate cue type and reason): verbal cues for adherance to LLE TDWB precautions and to keep RW underneath BOS Transfer (Assistive device): Rolling walker Locomotion  Gait Ambulation: Yes Gait Assistance: Supervision/Verbal cueing Gait Distance (Feet): 30 Feet Assistive device: Rolling walker Gait Assistance Details: Verbal cues for safe use of DME/AE;Verbal cues for precautions/safety Gait Assistance Details: verbal cues to maintain LLE TDWB precautions Gait Gait: Yes Gait Pattern: Impaired Gait Pattern: Step-to pattern;Decreased step length - right;Decreased stride length;Poor foot clearance - right;Narrow base of support;Trunk flexed Gait velocity: decreased Stairs / Additional Locomotion Stairs: No Pick up small object from the floor assist level: Supervision/Verbal cueing Pick up small object from the floor assistive device: tissue box with Art gallery manager Mobility: Yes Wheelchair Assistance: Dentist: Both upper extremities Wheelchair Parts Management: Needs assistance Distance: 168f  Trunk/Postural Assessment  Cervical Assessment Cervical Assessment: Exceptions to [facility](forward head) Thoracic Assessment Thoracic Assessment: Exceptions to [facility]) Lumbar Assessment Lumbar Assessment: Exceptions to [facility](posterior pelvic tilt) Postural Control Postural Control: Deficits on evaluation Righting Reactions: altered due to LLE TDWB precautions  Balance Balance Balance Assessed: Yes Static Sitting Balance Static Sitting - Balance Support: Feet supported;Bilateral upper extremity supported Static Sitting - Level of Assistance: 7: Independent Dynamic Sitting Balance Dynamic Sitting - Balance Support: Feet supported;No upper extremity supported Dynamic Sitting - Level of Assistance: 6: Modified independent (Device/Increase time) Static Standing Balance Static Standing - Balance Support: Bilateral upper extremity supported;During functional activity (RW) Static Standing - Level of Assistance: 5: Stand by assistance (supervision) Dynamic Standing Balance Dynamic Standing - Balance Support: Bilateral upper extremity supported;During functional activity (RW) Dynamic Standing - Level of Assistance: 5: Stand by assistance (supervision) Dynamic Standing - Comments: with transfers and gait Extremity Assessment  RLE Assessment RLE Assessment: Exceptions to W[facility] CampusGeneral Strength Comments: tested in sitting RLE Strength Right Hip Flexion: 4/5 Right Hip ABduction: 4-/5 Right Hip ADduction: 4-/5 Right Knee Flexion: 4-/5 Right Knee Extension: 4/5 Right Ankle Dorsiflexion: 4/5 Right Ankle Plantar Flexion: 4/5 LLE Assessment LLE Assessment: Exceptions to [facility] St[facility] JosephGeneral Strength Comments: tested in sitting LLE Strength Left Hip Flexion: 3+/5 Left Hip ABduction: 4-/5 Left Hip ADduction: 4-/5 Left Knee Flexion: 3+/5 Left Knee Extension: 4-/5 Left Ankle  Dorsiflexion: 4-/5   AAlfonse AlpersPT, DPT 04/22/2022, 7:24 AM

## 2022-04-22 NOTE — Patient Care Conference (Signed)
Inpatient RehabilitationTeam Conference and Plan of Care Update Date: 04/22/2022   Time: 11:47 AM    Patient Name: Diana Frost      Medical Record Number: [id number]  Date of Birth: 07-Jul-1945 Sex: Female         Room/Bed: 4M06C/4M06C-01 Payor Info: Payor: MEDICARE / Plan: MEDICARE PART A AND B / Product Type: *No Product type* /    Admit Date/Time:  04/02/2022  2:27 PM  Primary Diagnosis:  Femur fracture, left [facility])  Hospital Problems: Principal Problem:   Femur fracture, left [facility])    Expected Discharge Date: Expected Discharge Date: 04/23/22  Team Members Present: Physician leading conference: Dr. Leeroy Cha Social Worker Present: Erlene Quan, BSW Nurse Present: Dorien Chihuahua, RN PT Present: Becky Sax, PT OT Present: Jennefer Bravo, OT SLP Present: Weston Anna, SLP     Current Status/Progress Goal Weekly Team Focus  Bowel/Bladder   Pt is continent/incontinent of bowel/bladder   Pt will gain full continence of bowel/bladder   Will assess qshift and PRN    Swallow/Nutrition/ Hydration               ADL's   Supervision UB, CGA LB, min A toileting   Supervision   DC planning, HEP, balance, TDWB adherence on LLE, strengthening    Mobility   bed mobility supervision, transfers with RW CGA, gait 63f with RW CGA, supervision WC mobility 1584f Improvements with adherance to LLE TDWB precautions and daughter attended family education on 1/15.   supervision overall  functional mobility/transfers, generalized strengthening and endurance, dynamic standing balance/coordination, D/C planning, equipment management, family education, pain management, and adherance to LLE TDWB precautions.    Communication                Safety/Cognition/ Behavioral Observations  Supervision-Min A   Supervision-Min A   Family Education complete    Pain   Pt states pain is 0/10   Pt will remain pain free   Will assess qshift and PRN    Skin   Pt's skin  is intact   Pt's skin will remain intact  Will assess qshift and PRN      Discharge Planning:  Family education complated on yesterday. Patient discharging home with daughter on 1/18. HH arranged with [facility].   Team Discussion: Patient is doing better and has met her goals.  Patient on target to meet rehab goals: yes  *See Care Plan and progress notes for long and short-term goals.   Revisions to Treatment Plan:  N/a  Teaching Needs: Safety, medications, transfers, toileting, etc.   Current Barriers to Discharge: Decreased caregiver support  Possible Resolutions to Barriers: Family education [place] [facility] D[facility]: 3N1, TTB, W/C     Medical Summary               I attest that I was present, lead the team conference, and concur with the assessment and plan of the team.   ShDorien Chihuahua 04/22/2022, 1:45 PM

## 2022-04-22 NOTE — Progress Notes (Addendum)
Speech Language Pathology Daily Session Note  Patient Details  Name: DWAN FENNEL MRN: [id number] Date of Birth: [birth year]  Today's Date: 04/22/2022 SLP Individual Time: 27-1430 SLP Individual Time Calculation (min): 45 min  Short Term Goals: Week 2: SLP Short Term Goal 1 (Week 2): STG's = LTG's due to ELOS  Skilled Therapeutic Interventions: Pt seen this date for skilled ST intervention targeting cognitive goals outlined in care plan. Pt received awake/alert and OOB in w/c talking to her daughter on the phone. Agreeable to intervention in hospital room. Pleasant and participatory throughout. Spoke with pt's daughter re: her concerns re: having to be "hands on" when her mother is walking.  Today's session with emphasis on re-assessment of cognitive skills to prepare for pending d/c and provide pt with insight into her progress and current cognitive abilities. COGNISTAT subtest's re-administered with scoring noted within functional limits for most categories. Scores are as follows: COGNISTAT Orientation subtest: 11/12 - WFL - improvement from evaluation Attention subtest: 8/8 - WFL Delayed Recall subtest: 11/12- WFL - improvement from evaluation  Mental Math Calculation: 2/4 - mild impairment Verbal Abstract Reasoning: 5/8  - WFL - improvement from evaluation  Verbal Judgment: 4/6 - WFL - improvement from evaluation  5-step sequencing (informal): 100% accuracy given Min-Mod A verbal and visual cues. Recalled TDWB precautions on LLE with Sup A.  At this time, pt appears to be functioning at her cognitive baseline; therefore, further ST services do not appear warranted upon d/c. Reinforced need for 24/7 supervision and assistance at time of discharge, particularly with mobility and completion of IADL tasks; pt verbalized understanding and demonstrated good understanding of recommendations. Pt's daughter has been in [place] sessions and has received education and training to prepare for her  mother's d/c home.   Pt left in room and OOB in w/c with all safety measures activated and call bell within reach. Continue per current ST POC.  Pain No pain reported; NAD  Therapy/Group: Individual Therapy  Jawaan Adachi A Slater Mcmanaman 04/22/2022, 3:32 PM

## 2022-04-22 NOTE — Progress Notes (Signed)
Occupational Therapy Discharge Summary  Patient Details  Name: Diana Frost MRN: [id number] Date of Birth: 1945-12-12  Date of Discharge from [place] service:April 22, 2022  Patient has met 9 of 9 long term goals due to improved activity tolerance, improved balance, ability to compensate for deficits, improved awareness, and improved coordination.  Patient to discharge at overall Supervision level.  Patient's daughter is independent to provide the necessary physical and cognitive assistance at discharge and has been a part of pt's entire rehab stay and has participated in hands on training on multiple accounts with both OT/PT regarding self-care and functional transfers and has verbalized her readiness to assist pt at her CLOF.    All goals met  Recommendation:  Patient will benefit from ongoing skilled OT services in home health setting to continue to advance functional skills in the area of BADL, iADL, and Reduce care partner burden.  Equipment: 3 in 1 BSC & TTB  Reasons for discharge: treatment goals met  Patient/family agrees with progress made and goals achieved: Yes  OT Discharge Precautions/Restrictions  Precautions Precautions: Fall Restrictions Weight Bearing Restrictions: Yes LLE Weight Bearing: Touchdown weight bearing ADL ADL Equipment Provided: Long-handled sponge Eating: Modified independent Where Assessed-Eating: Wheelchair Grooming: Modified independent Where Assessed-Grooming: Sitting at sink Upper Body Bathing: Modified independent Where Assessed-Upper Body Bathing: Shower Lower Body Bathing: Supervision/safety Where Assessed-Lower Body Bathing: Shower Upper Body Dressing: Setup Where Assessed-Upper Body Dressing: Wheelchair Lower Body Dressing: Supervision/safety Where Assessed-Lower Body Dressing: Sitting at sink, Standing at sink Toileting: Contact guard Where Assessed-Toileting: Bedside Commode, Toilet Toilet Transfer: Close supervision Toilet  Transfer Method: Arts development officer: Bedside commode, Raised toilet seat Tub/Shower Transfer: Metallurgist Method: Stand pivot, Sit pivot Tub/Shower Equipment: Facilities manager: Curator Method: Radiographer, therapeutic: Radio broadcast assistant, Grab bars Vision Baseline Vision/History: 1 Wears glasses Patient Visual Report: No change from baseline Vision Assessment?: No apparent visual deficits Perception  Perception: Within Functional Limits Praxis Praxis: Intact Cognition Cognition Overall Cognitive Status: Impaired/Different from baseline Arousal/Alertness: Awake/alert Orientation Level: Person;Place;Situation Person: Oriented Place: Oriented Situation: Oriented Memory: Impaired Awareness: Appears intact Problem Solving: Impaired Safety/Judgment: Appears intact Comments: continued difficulty with LLE TDWB precautions - improved since eval Brief Interview for Mental Status (BIMS) Repetition of Three Words (First Attempt): 3 Temporal Orientation: Year: Correct Temporal Orientation: Month: Accurate within 5 days Temporal Orientation: Day: Correct Recall: "Sock": No, could not recall Recall: "Blue": Yes, no cue required Recall: "Bed": Yes, no cue required BIMS Summary Score: 13 Sensation Sensation Light Touch: Appears Intact Hot/Cold: Appears Intact (pt sensitive to hot tempatures on bilateral feet and legs) Proprioception: Appears Intact Coordination Gross Motor Movements are Fluid and Coordinated: No Fine Motor Movements are Fluid and Coordinated: Yes Coordination and Movement Description: altered balance strateiges 2/2 LLE TDWB precautions, generalized weakness, and deconditioning - improved since eval Finger Nose Finger Test: [facility] but slow Heel Shin Test: [facility] bilaterally Motor  Motor Motor: Other (comment) Motor - Skilled Clinical Observations: altered balance  strateiges 2/2 LLE TDWB precautions, generalized weakness, and deconditioning - improved since eval Mobility  Bed Mobility Bed Mobility: Rolling Right;Rolling Left;Sit to Supine;Supine to Sit Rolling Right: Supervision/verbal cueing Rolling Left: Supervision/Verbal cueing Supine to Sit: Supervision/Verbal cueing Sit to Supine: Supervision/Verbal cueing Transfers Sit to Stand: Supervision/Verbal cueing Stand to Sit: Supervision/Verbal cueing  Trunk/Postural Assessment  Cervical Assessment Cervical Assessment: Exceptions to [facility] (forward head) Thoracic Assessment Thoracic Assessment: Exceptions to [facility] (rounded  shoulders) Lumbar Assessment Lumbar Assessment: Exceptions to [facility] (posterior pelvic tilt) Postural Control Postural Control: Deficits on evaluation Righting Reactions: altered due to LLE TDWB precautions  Balance Balance Balance Assessed: Yes Static Sitting Balance Static Sitting - Balance Support: Feet supported;Bilateral upper extremity supported Static Sitting - Level of Assistance: 7: Independent Dynamic Sitting Balance Dynamic Sitting - Balance Support: Feet supported;No upper extremity supported Dynamic Sitting - Level of Assistance: 6: Modified independent (Device/Increase time) Static Standing Balance Static Standing - Balance Support: Bilateral upper extremity supported;During functional activity (RW) Static Standing - Level of Assistance: 5: Stand by assistance (supervision) Dynamic Standing Balance Dynamic Standing - Balance Support: Bilateral upper extremity supported;During functional activity (RW) Dynamic Standing - Level of Assistance: 5: Stand by assistance (supervision) Dynamic Standing - Comments: with transfers and gait Extremity/Trunk Assessment RUE Assessment RUE Assessment: Within Functional Limits Active Range of Motion (AROM) Comments: WFL General Strength Comments: 4-/5 triceps, otherwise 5/5 LUE Assessment LUE Assessment: Within Functional  Limits Active Range of Motion (AROM) Comments: WFL General Strength Comments: 4-/5 triceps, otherwise 5/5   Diana Frost Diana Crisanto, MS, OTR/L  04/22/2022, 3:40 PM

## 2022-04-22 NOTE — Progress Notes (Signed)
PROGRESS NOTE   Subjective/Complaints: Commended on excellent job controlling her blood sugars! She denies pain Excited to go home tomorrow  ROS: Patient denies fever, rash, sore throat, blurred vision, dizziness, nausea, vomiting, diarrhea, cough, shortness of breath or chest pain, headache, or mood change. Hip pain is well controlled   Objective:   No results found. Recent Labs    04/20/22 0538  WBC 5.9  HGB 10.2*  HCT 31.7*  PLT 262      Recent Labs    04/20/22 0538  NA 138  K 3.9  CL 100  CO2 29  GLUCOSE 220*  BUN 17  CREATININE 0.83  CALCIUM 8.9      Intake/Output Summary (Last 24 hours) at 04/22/2022 1044 Last data filed at 04/22/2022 0803 Gross per 24 hour  Intake 660 ml  Output --  Net 660 ml        Physical Exam: Vital Signs Blood pressure 115/67, pulse 99, temperature 98.8 F (37.1 C), temperature source Oral, resp. rate 16, height '5\' 2"'$  (1.575 m), weight 50.2 kg, SpO2 96 %.   Constitutional: No distress . Vital signs reviewed. HEENT: NCAT, EOMI, oral membranes moist Neck: supple Cardiovascular: RRR without murmur. No JVD    Respiratory/Chest: CTA Bilaterally without wheezes or rales. Normal effort    GI/Abdomen: BS +, non-tender, non-distended Ext: no clubbing, cyanosis, or edema Psych: pleasant and cooperative  Skin: No evidence of breakdown, no evidence of rash, left hip incision closed, clean. Neuro:  Follows commands, normal speech and language, CN 2-12 grossly intact, CN 2-12 grossly intact, improved alertness Sensation intact to LT in all 4 extremities Strength 5/5 in b/l UE Strength 4+/5 in RLE 2-3/5 left hip or knee, strength 4/5 ankle PF and DF Squat pivot transfer CG Musculoskeletal: L hip appropriately tender, appropriate swelling Donning pants with supervision CG sit to stand    Assessment/Plan: 1. Functional deficits which require 3+ hours per day of  interdisciplinary therapy in a comprehensive inpatient rehab setting. Physiatrist is providing close team supervision and 24 hour management of active medical problems listed below. Physiatrist and rehab team continue to assess barriers to discharge/monitor patient progress toward functional and medical goals  Care Tool:  Bathing    Body parts bathed by patient: Right arm, Left arm, Chest, Abdomen, Front perineal area, Buttocks, Right upper leg, Left upper leg, Face, Right lower leg, Left lower leg   Body parts bathed by helper: Right lower leg, Left lower leg     Bathing assist Assist Level: Supervision/Verbal cueing     Upper Body Dressing/Undressing Upper body dressing   What is the patient wearing?: Pull over shirt    Upper body assist Assist Level: Set up assist    Lower Body Dressing/Undressing Lower body dressing      What is the patient wearing?: Incontinence brief, Pants     Lower body assist Assist for lower body dressing: Contact Guard/Touching assist     Toileting Toileting    Toileting assist Assist for toileting: Minimal Assistance - Patient > 75%     Transfers Chair/bed transfer  Transfers assist     Chair/bed transfer assist level: Contact Guard/Touching assist  Locomotion Ambulation   Ambulation assist   Ambulation activity did not occur: Safety/medical concerns (fatigue, poor adherance to TDWB precautions)  Assist level: Supervision/Verbal cueing Assistive device: Walker-rolling Max distance: 110f   Walk 10 feet activity   Assist  Walk 10 feet activity did not occur: Safety/medical concerns (fatigue, poor adherance to TDWB precautions)  Assist level: Supervision/Verbal cueing Assistive device: Walker-rolling   Walk 50 feet activity   Assist Walk 50 feet with 2 turns activity did not occur: Safety/medical concerns (fatigue, poor adherance to TDWB precautions)         Walk 150 feet activity   Assist Walk 150 feet  activity did not occur: Safety/medical concerns (fatigue, poor adherance to TDWB precautions)         Walk 10 feet on uneven surface  activity   Assist Walk 10 feet on uneven surfaces activity did not occur: Safety/medical concerns (fatigue, poor adherance to TDWB precautions)         Wheelchair     Assist Is the patient using a wheelchair?: Yes Type of Wheelchair: Manual    Wheelchair assist level: Supervision/Verbal cueing Max wheelchair distance: 1565f   Wheelchair 50 feet with 2 turns activity    Assist        Assist Level: Supervision/Verbal cueing   Wheelchair 150 feet activity     Assist      Assist Level: Supervision/Verbal cueing   Blood pressure 115/67, pulse 99, temperature 98.8 F (37.1 C), temperature source Oral, resp. rate 16, height '5\' 2"'$  (1.575 m), weight 50.2 kg, SpO2 96 %.  Medical Problem List and Plan: 1. Functional deficits secondary to Left femoral neck fracture s/p percutaneous fixation on 03/29/22             -patient may shower, please cover incision             -ELOS/Goals: 12-14 days , PT/OT supervision to mod I          -Continue CIR therapies including PT, OT   -Interdisciplinary Team Conference today   2.  Impaired mobility: [place] and is negative for clots. Continue Lovenox             -antiplatelet therapy: ASA '81mg'$  3. Pain from hip fracture: d/c Norco. Scheduled tylenol increased to 1,'000mg'$  TID. Discussed how to wean at home. 4. Daytime somnolence: decrease melatonin to '3mg'$  HS 5. Neuropsych/cognition: This patient is capable of making decisions on her own behalf. 6. Skin/Wound Care: Routine pressure relief measures.  7. Fluids/Electrolytes/Nutrition: Monitor I/O.  8. Left subcapital femur Fx s/p fixation: TDWB per Dr. HoEarnestine Leys.T1DM with prior episodes of hypoglycemia: continue omnipod and dexcom --   At this point duaghter will review insulin pump instructions with pt this am , place the device before  she leaves at noon. CBG (last 3)  Recent Labs    04/21/22 2035 04/22/22 0304 04/22/22 0601  GLUCAP 191* 200* 155*   -improving control. Continue with pump per pt 10. Orthostatic hypotension: abdominal binder ordered. Hydrocodone dose lowered and frequency decreased to qH prn --Also reports of diplopia per optho notes.  - Continue Midodrine for BP support. Compression garments ordered for during therapy.  -appears improved 11. CAD/Blood pressure: Denies chest pain 12. Depression/anxiety?: Continue Zoloft and Bupropion. Supplement magnesium to goal of 2 13. Acute blood loss anemia: Stable around 9.0. Recheck CBC in am.              -Continue iron supplementation 14. Hypokalemia: Was supplemented  X 1 today. K+ 3.4 12/28. Recheck in am.   12/30- K+ 3.5- will recheck Monday  1/14- K+ 3.6 on 1/10 15. Constipation: changed senna to HS  12/30- LBM 2 days ago- is normal for her to go q2-3 days. 1/15 2 large bm's after sorbitol  16. Fatigue: checked B12 level and is normal 17. Suboptimal magnesium: supplemented 2grams IV on 12/28, 1gm ordered 1/3.  18. Radiation induced nausea: discussed hyperbaric oxygen therapy outpatient. Scheduled compazine prior to breakfast. Resolved.  19. Suboptimal vitamin D level: continue ergocalciferol 50,000U ordered q7 days 20. Tachycardia: EKG ordered and shows sinus tachycardia, discussed with patient. Improved.  21. Lethargy: resolved   LOS: 20 days A FACE TO FACE EVALUATION WAS PERFORMED  Clide Deutscher Slater Mcmanaman 04/22/2022, 10:44 AM

## 2022-04-22 NOTE — Progress Notes (Signed)
Patient ID: Diana Frost, female   DOB: 1946-01-30, 77 y.o.   MRN: [id number]  Team Conference Report to Patient/Family  Team Conference discussion was reviewed with the patient and caregiver, including goals, any changes in plan of care and target discharge date.  Patient and caregiver express understanding and are in agreement.  The patient has a target discharge date of 04/23/22.  Sw met with patient and provided conference updates. Patient aware that she is medically stable for d/c tomorrow. Daughter completed family education on Monday. No additional questions or concerns.   [name] 04/22/2022, 1:55 PM

## 2022-04-22 NOTE — Plan of Care (Signed)
  Problem: RH Balance Goal: LTG Patient will maintain dynamic standing with ADLs (OT) Description: LTG:  Patient will maintain dynamic standing balance with assist during activities of daily living (OT)  Outcome: Completed/Met   Problem: Sit to Stand Goal: LTG:  Patient will perform sit to stand in prep for activites of daily living with assistance level (OT) Description: LTG:  Patient will perform sit to stand in prep for activites of daily living with assistance level (OT) Outcome: Completed/Met   Problem: RH Bathing Goal: LTG Patient will bathe all body parts with assist levels (OT) Description: LTG: Patient will bathe all body parts with assist levels (OT) Outcome: Completed/Met   Problem: RH Dressing Goal: LTG Patient will perform upper body dressing (OT) Description: LTG Patient will perform upper body dressing with assist, with/without cues (OT). Outcome: Completed/Met Goal: LTG Patient will perform lower body dressing w/assist (OT) Description: LTG: Patient will perform lower body dressing with assist, with/without cues in positioning using equipment (OT) Outcome: Completed/Met   Problem: RH Toileting Goal: LTG Patient will perform toileting task (3/3 steps) with assistance level (OT) Description: LTG: Patient will perform toileting task (3/3 steps) with assistance level (OT)  Outcome: Completed/Met   Problem: RH Simple Meal Prep Goal: LTG Patient will perform simple meal prep w/assist (OT) Description: LTG: Patient will perform simple meal prep with assistance, with/without cues (OT). Outcome: Completed/Met   Problem: RH Toilet Transfers Goal: LTG Patient will perform toilet transfers w/assist (OT) Description: LTG: Patient will perform toilet transfers with assist, with/without cues using equipment (OT) Outcome: Completed/Met   Problem: RH Tub/Shower Transfers Goal: LTG Patient will perform tub/shower transfers w/assist (OT) Description: LTG: Patient will perform  tub/shower transfers with assist, with/without cues using equipment (OT) Outcome: Completed/Met

## 2022-04-22 NOTE — Progress Notes (Signed)
Inpatient Rehabilitation Discharge Medication Review by a Pharmacist  A complete drug regimen review was completed for this patient to identify any potential clinically significant medication issues.  High Risk Drug Classes Is patient taking? Indication by Medication  Antipsychotic No   Anticoagulant No   Antibiotic No   Opioid No   Antiplatelet No   Hypoglycemics/insulin Yes   Vasoactive Medication Yes Midodrine - hypotension   Chemotherapy No   Other Yes Aspirin - stroke ppx Alendronate - bone health Bupropion, sertraline - mood Docusate, Senokot-S - constipation Insulin pump - DM  Melatonin - PRN sleep Prochlorperazine - nausea  Rosuvastatin - HLD Ferrous sulfate, Vitamin D, Zinc gluconate - supplement      Type of Medication Issue Identified Description of Issue Recommendation(s)  Drug Interaction(s) (clinically significant)     Duplicate Therapy     Allergy     No Medication Administration End Date     Incorrect Dose     Additional Drug Therapy Needed     Significant med changes from prior encounter (inform family/care partners about these prior to discharge). Insulin pump continued at discharge Communicate medication changes with patient/family at discharge  Other       Clinically significant medication issues were identified that warrant physician communication and completion of prescribed/recommended actions by midnight of the next day:  Yes discussed and resolved  Name of provider notified for urgent issues identified: Pam Love   Provider Method of Notification: phone/secure chat    Pharmacist comments:  Insulin lispro discontinued > clarified need for insulin to refill insulin pump, none currently ordered for discharge medications PA to reorder and contact facility to continue insulin for insulin pump   Time spent performing this drug regimen review (minutes): 20   Thank you for allowing pharmacy to be a part of this patient's care.  Ardyth Harps, PharmD Clinical Pharmacist

## 2022-04-23 DIAGNOSIS — S728X2S Other fracture of left femur, sequela: Secondary | ICD-10-CM

## 2022-04-23 LAB — GLUCOSE, CAPILLARY
Glucose-Capillary: 115 mg/dL — ABNORMAL HIGH (ref 70–99)
Glucose-Capillary: 136 mg/dL — ABNORMAL HIGH (ref 70–99)

## 2022-04-23 MED ORDER — ASPIRIN 81 MG PO TABS: 81.0000 mg | ORAL_TABLET | Freq: Every day | ORAL | 0 refills | Status: DC

## 2022-04-23 MED ORDER — DOCUSATE SODIUM 100 MG PO CAPS: 200.0000 mg | ORAL_CAPSULE | Freq: Every day | ORAL | 0 refills | Status: DC

## 2022-04-23 MED ORDER — VITAMIN D (ERGOCALCIFEROL) 1.25 MG (50000 UNIT) PO CAPS: 50000.0000 [IU] | ORAL_CAPSULE | ORAL | 0 refills | Status: DC

## 2022-04-23 MED ORDER — ACETAMINOPHEN 500 MG PO TABS: 1000.0000 mg | ORAL_TABLET | Freq: Three times a day (TID) | ORAL | 0 refills | Status: AC

## 2022-04-23 MED ORDER — MIDODRINE HCL 5 MG PO TABS: 5.0000 mg | ORAL_TABLET | Freq: Three times a day (TID) | ORAL | 0 refills | Status: AC

## 2022-04-23 MED ORDER — ZINC GLUCONATE 50 MG PO TABS: 50.0000 mg | ORAL_TABLET | Freq: Every day | ORAL | 0 refills | Status: AC

## 2022-04-23 MED ORDER — BUPROPION HCL ER (XL) 150 MG PO TB24: 150.0000 mg | ORAL_TABLET | Freq: Every morning | ORAL | 0 refills | Status: AC

## 2022-04-23 MED ORDER — MELATONIN 3 MG PO TABS: 3.0000 mg | ORAL_TABLET | Freq: Every evening | ORAL | 0 refills | Status: AC | PRN

## 2022-04-23 MED ORDER — SERTRALINE HCL 100 MG PO TABS: 100.0000 mg | ORAL_TABLET | Freq: Every day | ORAL | 0 refills | Status: AC

## 2022-04-23 MED ORDER — SENNOSIDES-DOCUSATE SODIUM 8.6-50 MG PO TABS: 2.0000 | ORAL_TABLET | Freq: Two times a day (BID) | ORAL | 0 refills | Status: AC

## 2022-04-23 MED ORDER — ROSUVASTATIN CALCIUM 10 MG PO TABS: 10.0000 mg | ORAL_TABLET | Freq: Every day | ORAL | 0 refills | Status: AC

## 2022-04-23 NOTE — Progress Notes (Signed)
Patient being DC home today with her daughter. Patient nor daughter [name] have any questions or concerns at this time regarding being DC home. Patient and daughter understand to continue taking prescribed medications and to attend all follow up appointments.

## 2022-04-23 NOTE — Progress Notes (Signed)
PROGRESS NOTE   Subjective/Complaints: No new complaints this morning [place] Patient's chart reviewed- No issues reported overnight Vitals signs stable   ROS: Patient denies fever, rash, sore throat, blurred vision, dizziness, nausea, vomiting, diarrhea, cough, shortness of breath or chest pain, headache, or mood change. Hip pain is well controlled   Objective:   No results found. No results for input(s): "WBC", "HGB", "HCT", "PLT" in the last 72 hours.     No results for input(s): "NA", "K", "CL", "CO2", "GLUCOSE", "BUN", "CREATININE", "CALCIUM" in the last 72 hours.     Intake/Output Summary (Last 24 hours) at 04/23/2022 0927 Last data filed at 04/23/2022 0836 Gross per 24 hour  Intake 477 ml  Output --  Net 477 ml        Physical Exam: Vital Signs Blood pressure 110/60, pulse 96, temperature 99.4 F (37.4 C), temperature source Oral, resp. rate 16, height '5\' 2"'$  (1.575 m), weight 50.2 kg, SpO2 97 %. Gen: no distress, normal appearing HEENT: oral mucosa pink and moist, NCAT Cardio: Reg rate Chest: normal effort, normal rate of breathing Abd: soft, non-distended Ext: no edema Psych: pleasant, normal affect Skin: No evidence of breakdown, no evidence of rash, left hip incision closed, clean. Neuro:  Follows commands, normal speech and language, CN 2-12 grossly intact, CN 2-12 grossly intact, improved alertness Sensation intact to LT in all 4 extremities Strength 5/5 in b/l UE Strength 4+/5 in RLE 2-3/5 left hip or knee, strength 4/5 ankle PF and DF Squat pivot transfer CG Musculoskeletal: L hip appropriately tender, appropriate swelling Donning pants with supervision CG sit to stand    Assessment/Plan: 1. Functional deficits which require 3+ hours per day of interdisciplinary therapy in a comprehensive inpatient rehab setting. Physiatrist is providing close team supervision and 24 hour management of  active medical problems listed below. Physiatrist and rehab team continue to assess barriers to discharge/monitor patient progress toward functional and medical goals  Care Tool:  Bathing    Body parts bathed by patient: Right arm, Left arm, Chest, Abdomen, Front perineal area, Buttocks, Right upper leg, Left upper leg, Face, Right lower leg, Left lower leg   Body parts bathed by helper: Right lower leg, Left lower leg     Bathing assist Assist Level: Supervision/Verbal cueing     Upper Body Dressing/Undressing Upper body dressing   What is the patient wearing?: Pull over shirt    Upper body assist Assist Level: Set up assist    Lower Body Dressing/Undressing Lower body dressing      What is the patient wearing?: Incontinence brief, Pants     Lower body assist Assist for lower body dressing: Supervision/Verbal cueing     Toileting Toileting    Toileting assist Assist for toileting: Contact Guard/Touching assist     Transfers Chair/bed transfer  Transfers assist     Chair/bed transfer assist level: Supervision/Verbal cueing     Locomotion Ambulation   Ambulation assist   Ambulation activity did not occur: Safety/medical concerns (fatigue, poor adherance to TDWB precautions)  Assist level: Supervision/Verbal cueing Assistive device: Walker-rolling Max distance: 66f   Walk 10 feet activity   Assist  Walk 10 feet activity  did not occur: Safety/medical concerns (fatigue, poor adherance to TDWB precautions)  Assist level: Supervision/Verbal cueing Assistive device: Walker-rolling   Walk 50 feet activity   Assist Walk 50 feet with 2 turns activity did not occur: Safety/medical concerns (fatigue, poor adherance to WC precautions, decreased balance)         Walk 150 feet activity   Assist Walk 150 feet activity did not occur: Safety/medical concerns (fatigue, poor adherance to WC precautions, decreased balance)         Walk 10 feet on  uneven surface  activity   Assist Walk 10 feet on uneven surfaces activity did not occur: Safety/medical concerns (fatigue, poor adherance to WC precautions, decreased balance)         Wheelchair     Assist Is the patient using a wheelchair?: Yes Type of Wheelchair: Manual    Wheelchair assist level: Supervision/Verbal cueing Max wheelchair distance: 134f    Wheelchair 50 feet with 2 turns activity    Assist        Assist Level: Supervision/Verbal cueing   Wheelchair 150 feet activity     Assist      Assist Level: Supervision/Verbal cueing   Blood pressure 110/60, pulse 96, temperature 99.4 F (37.4 C), temperature source Oral, resp. rate 16, height '5\' 2"'$  (1.575 m), weight 50.2 kg, SpO2 97 %.  Medical Problem List and Plan: 1. Functional deficits secondary to Left femoral neck fracture s/p percutaneous fixation on 03/29/22             -patient may shower, please cover incision             -ELOS/Goals: 12-14 days , PT/OT supervision to mod I         D/c today 2.  Impaired mobility: [place] and is negative for clots. D/c lovenox upon discharge             -antiplatelet therapy: ASA '81mg'$  3. Pain from hip fracture: d/c Norco. Scheduled tylenol increased to 1,'000mg'$  TID. Discussed how to wean at home. 4. Daytime somnolence: continue melatonin to '3mg'$  HS 5. Neuropsych/cognition: This patient is capable of making decisions on her own behalf. 6. Skin/Wound Care: Routine pressure relief measures.  7. Fluids/Electrolytes/Nutrition: Monitor I/O.  8. Left subcapital femur Fx s/p fixation: TDWB per Dr. HEarnestine Leys9.T1DM with prior episodes of hypoglycemia: continue omnipod and dexcom --  CBG (last 3)  Recent Labs    04/22/22 2046 04/23/22 0307 04/23/22 0620  GLUCAP 138* 115* 136*   -improving control. Continue with pump per pt 10. Orthostatic hypotension: abdominal binder ordered. Hydrocodone dose lowered and frequency decreased to qH prn --Also reports  of diplopia per optho notes.  - Continue Midodrine for BP support. Compression garments ordered for during therapy.  -appears improved 11. CAD/Blood pressure: Denies chest pain 12. Depression/anxiety?: Continue Zoloft and Bupropion. Supplement magnesium to goal of 2 13. Acute blood loss anemia: Stable around 9.0. Recheck CBC in am.              -Continue iron supplementation 14. Hypokalemia: Was supplemented X 1 today. K+ 3.4 12/28. Recheck in am.   12/30- K+ 3.5- will recheck Monday  1/14- K+ 3.6 on 1/10 15. Constipation: changed senna to HS  12/30- LBM 2 days ago- is normal for her to go q2-3 days. 1/15 2 large bm's after sorbitol  16. Fatigue: checked B12 level and is normal 17. Suboptimal magnesium: supplemented 2grams IV on 12/28, 1gm ordered 1/3.  18. Radiation induced nausea:  discussed hyperbaric oxygen therapy outpatient. Scheduled compazine prior to breakfast. Resolved.  19. Suboptimal vitamin D level: continue ergocalciferol 50,000U ordered q7 days 20. Tachycardia: EKG ordered and shows sinus tachycardia, discussed with patient. Improved.  21. Lethargy: resolved   >30 minutes spent in discharge of patient including review of medications and follow-up appointments, physical examination, and in answering all patient's questions  LOS: 21 days A FACE TO FACE EVALUATION WAS [place] 04/23/2022, 9:27 AM

## 2022-04-23 NOTE — Progress Notes (Signed)
Recreational Therapy Discharge Summary Patient Details  Name: Diana Frost MRN: [id number] Date of Birth: 06-Nov-1945 Today's Date: 04/23/2022  Comments on progress toward goals: Pt made good progress during LOS and is discharging home today with daughter to provide/coordinate the needed supervision/assistance.  TR sessions focused on pt education on leisure education, activity analysis with identification of modifications, and stress management.    Reasons for discharge: discharge from hospital  Follow-up: [place] agrees with progress made and goals achieved: Yes  Desiree Fleming 04/23/2022, 3:47 PM

## 2022-04-24 ENCOUNTER — Emergency Department: Payer: Medicare Other

## 2022-04-24 ENCOUNTER — Other Ambulatory Visit: Payer: Self-pay

## 2022-04-24 ENCOUNTER — Inpatient Hospital Stay
Admission: EM | Admit: 2022-04-24 | Discharge: 2022-05-04 | DRG: 481 | Disposition: A | Discharge status: Skilled Nursing Facility | Payer: Medicare Other | Attending: Internal Medicine | Admitting: Internal Medicine

## 2022-04-24 DIAGNOSIS — S7222XA Displaced subtrochanteric fracture of left femur, initial encounter for closed fracture: Secondary | ICD-10-CM | POA: Diagnosis present

## 2022-04-24 DIAGNOSIS — Z794 Long term (current) use of insulin: Secondary | ICD-10-CM | POA: Diagnosis not present

## 2022-04-24 DIAGNOSIS — W19XXXD Unspecified fall, subsequent encounter: Secondary | ICD-10-CM | POA: Diagnosis present

## 2022-04-24 DIAGNOSIS — R000 Tachycardia, unspecified: Secondary | ICD-10-CM | POA: Diagnosis not present

## 2022-04-24 DIAGNOSIS — Z955 Presence of coronary angioplasty implant and graft: Secondary | ICD-10-CM | POA: Diagnosis not present

## 2022-04-24 DIAGNOSIS — Z66 Do not resuscitate: Secondary | ICD-10-CM | POA: Diagnosis present

## 2022-04-24 DIAGNOSIS — Z888 Allergy status to other drugs, medicaments and biological substances status: Secondary | ICD-10-CM

## 2022-04-24 DIAGNOSIS — Z751 Person awaiting admission to adequate facility elsewhere: Secondary | ICD-10-CM | POA: Diagnosis not present

## 2022-04-24 DIAGNOSIS — E1010 Type 1 diabetes mellitus with ketoacidosis without coma: Secondary | ICD-10-CM | POA: Diagnosis not present

## 2022-04-24 DIAGNOSIS — W19XXXA Unspecified fall, initial encounter: Secondary | ICD-10-CM | POA: Diagnosis present

## 2022-04-24 DIAGNOSIS — F32A Depression, unspecified: Secondary | ICD-10-CM | POA: Diagnosis present

## 2022-04-24 DIAGNOSIS — Z7982 Long term (current) use of aspirin: Secondary | ICD-10-CM

## 2022-04-24 DIAGNOSIS — Z79899 Other long term (current) drug therapy: Secondary | ICD-10-CM

## 2022-04-24 DIAGNOSIS — I10 Essential (primary) hypertension: Secondary | ICD-10-CM | POA: Diagnosis present

## 2022-04-24 DIAGNOSIS — Z87891 Personal history of nicotine dependence: Secondary | ICD-10-CM

## 2022-04-24 DIAGNOSIS — I2510 Atherosclerotic heart disease of native coronary artery without angina pectoris: Secondary | ICD-10-CM | POA: Diagnosis present

## 2022-04-24 DIAGNOSIS — E109 Type 1 diabetes mellitus without complications: Secondary | ICD-10-CM | POA: Diagnosis present

## 2022-04-24 DIAGNOSIS — R0902 Hypoxemia: Secondary | ICD-10-CM | POA: Diagnosis not present

## 2022-04-24 DIAGNOSIS — I959 Hypotension, unspecified: Secondary | ICD-10-CM | POA: Diagnosis not present

## 2022-04-24 DIAGNOSIS — Z79890 Hormone replacement therapy (postmenopausal): Secondary | ICD-10-CM

## 2022-04-24 DIAGNOSIS — I444 Left anterior fascicular block: Secondary | ICD-10-CM | POA: Diagnosis present

## 2022-04-24 DIAGNOSIS — D62 Acute posthemorrhagic anemia: Secondary | ICD-10-CM | POA: Diagnosis not present

## 2022-04-24 DIAGNOSIS — Z85820 Personal history of malignant melanoma of skin: Secondary | ICD-10-CM

## 2022-04-24 DIAGNOSIS — E1059 Type 1 diabetes mellitus with other circulatory complications: Secondary | ICD-10-CM | POA: Diagnosis not present

## 2022-04-24 DIAGNOSIS — N179 Acute kidney failure, unspecified: Secondary | ICD-10-CM | POA: Diagnosis not present

## 2022-04-24 DIAGNOSIS — E876 Hypokalemia: Secondary | ICD-10-CM | POA: Diagnosis present

## 2022-04-24 DIAGNOSIS — F05 Delirium due to known physiological condition: Secondary | ICD-10-CM | POA: Diagnosis not present

## 2022-04-24 DIAGNOSIS — I25118 Atherosclerotic heart disease of native coronary artery with other forms of angina pectoris: Secondary | ICD-10-CM | POA: Diagnosis not present

## 2022-04-24 DIAGNOSIS — R296 Repeated falls: Secondary | ICD-10-CM | POA: Diagnosis present

## 2022-04-24 DIAGNOSIS — I252 Old myocardial infarction: Secondary | ICD-10-CM | POA: Diagnosis not present

## 2022-04-24 DIAGNOSIS — Z1152 Encounter for screening for COVID-19: Secondary | ICD-10-CM

## 2022-04-24 DIAGNOSIS — Z801 Family history of malignant neoplasm of trachea, bronchus and lung: Secondary | ICD-10-CM

## 2022-04-24 DIAGNOSIS — S72302A Unspecified fracture of shaft of left femur, initial encounter for closed fracture: Principal | ICD-10-CM

## 2022-04-24 DIAGNOSIS — Z882 Allergy status to sulfonamides status: Secondary | ICD-10-CM | POA: Diagnosis not present

## 2022-04-24 DIAGNOSIS — S72302K Unspecified fracture of shaft of left femur, subsequent encounter for closed fracture with nonunion: Secondary | ICD-10-CM | POA: Diagnosis present

## 2022-04-24 DIAGNOSIS — Z803 Family history of malignant neoplasm of breast: Secondary | ICD-10-CM

## 2022-04-24 DIAGNOSIS — Z90710 Acquired absence of both cervix and uterus: Secondary | ICD-10-CM

## 2022-04-24 DIAGNOSIS — R54 Age-related physical debility: Secondary | ICD-10-CM | POA: Diagnosis present

## 2022-04-24 LAB — BASIC METABOLIC PANEL
Anion gap: 9 (ref 5–15)
BUN: 24 mg/dL — ABNORMAL HIGH (ref 8–23)
CO2: 24 mmol/L (ref 22–32)
Calcium: 8.5 mg/dL — ABNORMAL LOW (ref 8.9–10.3)
Chloride: 105 mmol/L (ref 98–111)
Creatinine, Ser: 0.9 mg/dL (ref 0.44–1.00)
GFR, Estimated: >60 mL/min (ref >60)
Glucose, Bld: 162 mg/dL — ABNORMAL HIGH (ref 70–99)
Potassium: 4.3 mmol/L (ref 3.5–5.1)
Sodium: 138 mmol/L (ref 135–145)

## 2022-04-24 LAB — CBG MONITORING, ED: Glucose-Capillary: 140 mg/dL — ABNORMAL HIGH (ref 70–99)

## 2022-04-24 LAB — CBC WITH DIFFERENTIAL/PLATELET
Abs Immature Granulocytes: 0.02 10*3/uL (ref 0.00–0.07)
Basophils Absolute: 0 10*3/uL (ref 0.0–0.1)
Basophils Relative: 1 %
Eosinophils Absolute: 0.1 10*3/uL (ref 0.0–0.5)
Eosinophils Relative: 1 %
HCT: 31.3 % — ABNORMAL LOW (ref 36.0–46.0)
Hemoglobin: 10 g/dL — ABNORMAL LOW (ref 12.0–15.0)
Immature Granulocytes: 0 %
Lymphocytes Relative: 13 %
Lymphs Abs: 0.9 10*3/uL (ref 0.7–4.0)
MCH: 31.4 pg (ref 26.0–34.0)
MCHC: 31.9 g/dL (ref 30.0–36.0)
MCV: 98.4 fL (ref 80.0–100.0)
Monocytes Absolute: 0.4 10*3/uL (ref 0.1–1.0)
Monocytes Relative: 6 %
Neutro Abs: 5.3 10*3/uL (ref 1.7–7.7)
Neutrophils Relative %: 79 %
Platelets: 246 10*3/uL (ref 150–400)
RBC: 3.18 MIL/uL — ABNORMAL LOW (ref 3.87–5.11)
RDW: 14.8 % (ref 11.5–15.5)
WBC: 6.7 10*3/uL (ref 4.0–10.5)
nRBC: 0 % (ref 0.0–0.2)

## 2022-04-24 LAB — CK: Total CK: 187 U/L (ref 38–234)

## 2022-04-24 LAB — GLUCOSE, CAPILLARY: Glucose-Capillary: 134 mg/dL — ABNORMAL HIGH (ref 70–99)

## 2022-04-24 MED ORDER — VITAMIN D (ERGOCALCIFEROL) 1.25 MG (50000 UNIT) PO CAPS
50000.0000 [IU] | ORAL_CAPSULE | ORAL | Status: DC
  Administered 2022-04-24 – 2022-05-01 (×2): 50000 [IU] via ORAL
  Filled 2022-04-24 (×3): qty 1

## 2022-04-24 MED ORDER — ZINC SULFATE 220 (50 ZN) MG PO CAPS: 220.0000 mg | ORAL_CAPSULE | Freq: Every day | ORAL | Status: DC

## 2022-04-24 MED ORDER — MIDODRINE HCL 5 MG PO TABS
5.0000 mg | ORAL_TABLET | Freq: Three times a day (TID) | ORAL | Status: DC
  Administered 2022-04-24 – 2022-05-04 (×25): 5 mg via ORAL
  Filled 2022-04-24 (×26): qty 1

## 2022-04-24 MED ORDER — METHOCARBAMOL 1000 MG/10ML IJ SOLN: 500.0000 mg | Freq: Four times a day (QID) | INTRAVENOUS | Status: DC | PRN

## 2022-04-24 MED ORDER — CEFAZOLIN SODIUM-DEXTROSE 2-4 GM/100ML-% IV SOLN
2.0000 g | INTRAVENOUS | Status: AC
  Administered 2022-04-25: 2 g via INTRAVENOUS
  Filled 2022-04-24: qty 100

## 2022-04-24 MED ORDER — HYDROCODONE-ACETAMINOPHEN 5-325 MG PO TABS
1.0000 | ORAL_TABLET | Freq: Four times a day (QID) | ORAL | Status: DC | PRN
  Administered 2022-04-24: 1 via ORAL
  Administered 2022-04-26 – 2022-05-01 (×4): 2 via ORAL
  Filled 2022-04-24: qty 2
  Filled 2022-04-24: qty 1
  Filled 2022-04-24 (×2): qty 2
  Filled 2022-04-24: qty 1
  Filled 2022-04-24: qty 2

## 2022-04-24 MED ORDER — MORPHINE SULFATE (PF) 2 MG/ML IV SOLN
0.5000 mg | INTRAVENOUS | Status: DC | PRN
  Administered 2022-04-24 – 2022-04-25 (×2): 0.5 mg via INTRAVENOUS
  Filled 2022-04-24 (×3): qty 1

## 2022-04-24 MED ORDER — SENNOSIDES-DOCUSATE SODIUM 8.6-50 MG PO TABS
2.0000 | ORAL_TABLET | Freq: Two times a day (BID) | ORAL | Status: DC
  Administered 2022-04-24 – 2022-05-04 (×19): 2 via ORAL
  Filled 2022-04-24 (×19): qty 2

## 2022-04-24 MED ORDER — MUPIROCIN 2 % EX OINT
1.0000 | TOPICAL_OINTMENT | Freq: Two times a day (BID) | CUTANEOUS | Status: AC
  Administered 2022-04-24 – 2022-04-28 (×8): 1 via NASAL
  Filled 2022-04-24 (×2): qty 22

## 2022-04-24 MED ORDER — ONDANSETRON HCL 4 MG/2ML IJ SOLN
4.0000 mg | Freq: Four times a day (QID) | INTRAMUSCULAR | Status: DC | PRN
  Administered 2022-04-24 – 2022-04-28 (×2): 4 mg via INTRAVENOUS
  Filled 2022-04-24 (×3): qty 2

## 2022-04-24 MED ORDER — BUPROPION HCL ER (XL) 150 MG PO TB24
150.0000 mg | ORAL_TABLET | Freq: Every morning | ORAL | Status: DC
  Administered 2022-04-26 – 2022-05-04 (×9): 150 mg via ORAL
  Filled 2022-04-24 (×9): qty 1

## 2022-04-24 MED ORDER — METHOCARBAMOL 500 MG PO TABS
500.0000 mg | ORAL_TABLET | Freq: Four times a day (QID) | ORAL | Status: DC | PRN
  Administered 2022-04-24: 500 mg via ORAL
  Filled 2022-04-24: qty 1

## 2022-04-24 MED ORDER — ROSUVASTATIN CALCIUM 10 MG PO TABS
10.0000 mg | ORAL_TABLET | Freq: Every day | ORAL | Status: DC
  Administered 2022-04-24 – 2022-05-03 (×10): 10 mg via ORAL
  Filled 2022-04-24 (×11): qty 1

## 2022-04-24 MED ORDER — MORPHINE SULFATE (PF) 4 MG/ML IV SOLN
4.0000 mg | Freq: Once | INTRAVENOUS | Status: AC
  Administered 2022-04-24: 4 mg via INTRAVENOUS
  Filled 2022-04-24: qty 1

## 2022-04-24 MED ORDER — ACETAMINOPHEN 500 MG PO TABS
1000.0000 mg | ORAL_TABLET | Freq: Three times a day (TID) | ORAL | Status: DC
  Administered 2022-04-24 – 2022-05-04 (×27): 1000 mg via ORAL
  Filled 2022-04-24 (×29): qty 2

## 2022-04-24 MED ORDER — SODIUM CHLORIDE 0.9 % IV SOLN: INTRAVENOUS | Status: AC

## 2022-04-24 MED ORDER — MELATONIN 5 MG PO TABS: 2.5000 mg | ORAL_TABLET | Freq: Every evening | ORAL | Status: DC | PRN

## 2022-04-24 MED ORDER — FERROUS SULFATE 325 (65 FE) MG PO TABS
325.0000 mg | ORAL_TABLET | Freq: Every day | ORAL | Status: DC
  Administered 2022-04-26 – 2022-05-04 (×9): 325 mg via ORAL
  Filled 2022-04-24 (×9): qty 1

## 2022-04-24 MED ORDER — SERTRALINE HCL 50 MG PO TABS
100.0000 mg | ORAL_TABLET | Freq: Every day | ORAL | Status: DC
  Administered 2022-04-26 – 2022-05-04 (×9): 100 mg via ORAL
  Filled 2022-04-24 (×9): qty 2

## 2022-04-24 MED ORDER — INSULIN ASPART 100 UNIT/ML IJ SOLN
0.0000 [IU] | Freq: Three times a day (TID) | INTRAMUSCULAR | Status: DC
  Administered 2022-04-24: 2 [IU] via SUBCUTANEOUS
  Filled 2022-04-24: qty 1

## 2022-04-24 MED ORDER — TRANEXAMIC ACID-NACL 1000-0.7 MG/100ML-% IV SOLN
1000.0000 mg | INTRAVENOUS | Status: AC
  Administered 2022-04-25: 1000 mg via INTRAVENOUS
  Filled 2022-04-24: qty 100

## 2022-04-24 NOTE — ED Provider Notes (Signed)
[facility] Provider Note    Event Date/Time   First MD Initiated Contact with Patient 04/24/22 1252     (approximate)   History   Fall   HPI  Diana Frost is a 77 y.o. female   Past medical history of type I diabetic on insulin pump, CAD, hypertension, gait disorder with frequent falls, recent left femur fracture status post surgical fixation who presents to the emergency department with another fall and deformity to the left hip/upper femur.  She was at home after being discharged from rehab and was helping her husband with his chronic medical problems when she had a mechanical slip and fall onto her left side denies head strike or loss of consciousness no blood thinners but did sustain significant injury to the left hip/upper leg.    No other acute medical complaints.  No other injuries reported and no other pain reported. She has 75 fentanyl which helped with pain by EMS.  Independent Historian contributed to assessment above: EMS  External Medical Documents Reviewed: Discharge summary written yesterday by physician assistant after operative fixation of the left hip fracture      Physical Exam   Triage Vital Signs: ED Triage Vitals  Enc Vitals Group     BP 04/24/22 1249 (!) 152/86     Pulse Rate 04/24/22 1249 97     Resp 04/24/22 1249 16     Temp 04/24/22 1249 97.8 F (36.6 C)     Temp Source 04/24/22 1249 Oral     SpO2 04/24/22 1249 93 %     Weight 04/24/22 1250 110 lb (49.9 kg)     Height 04/24/22 1250 '5\' 2"'$  (1.575 m)     Head Circumference --      Peak Flow --      Pain Score 04/24/22 1250 6     Pain Loc --      Pain Edu? --      Excl. in [place]? --     Most recent vital signs: Vitals:   04/24/22 1249  BP: (!) 152/86  Pulse: 97  Resp: 16  Temp: 97.8 F (36.6 C)  SpO2: 93%    General: Awake, no distress.  CV:  Good peripheral perfusion.  Resp:  Normal effort.  Abd:  No distention.  Other:  Awake alert appears to be in  moderate pain, deformity to the left upper thigh, neurovascular intact, no other signs of trauma noted   ED Results / Procedures / Treatments   Labs (all labs ordered are listed, but only abnormal results are displayed) Labs Reviewed  BASIC METABOLIC PANEL - Abnormal; Notable for the following components:      Result Value   Glucose, Bld 162 (*)    BUN 24 (*)    Calcium 8.5 (*)    All other components within normal limits  CBC WITH DIFFERENTIAL/PLATELET - Abnormal; Notable for the following components:   RBC 3.18 (*)    Hemoglobin 10.0 (*)    HCT 31.3 (*)    All other components within normal limits     I ordered and reviewed the above labs they are notable for Hgb 10 stable, glucose 160s normal AG  EKG  ED ECG REPORT I, Lucillie Garfinkel, the attending physician, personally viewed and interpreted this ECG.   Date: 04/24/2022  EKG Time: 1315  Rate: 94  Rhythm: nsr  Axis: nl  Intervals:lafb  ST&T Change: no stemi    RADIOLOGY I independently reviewed and  interpreted XR hip and see prox femur fracture   PROCEDURES:  Critical Care performed: No  Procedures   MEDICATIONS ORDERED IN ED: Medications  morphine (PF) 4 MG/ML injection 4 mg (4 mg Intravenous Given 04/24/22 1404)    External physician / consultants:  I spoke with Dr Harlow Mares of Orthopedics regarding care plan for this patient.   IMPRESSION / MDM / ASSESSMENT AND PLAN / ED COURSE  I reviewed the triage vital signs and the nursing notes.                                Patient's presentation is most consistent with acute presentation with potential threat to life or bodily function.  Differential diagnosis includes, but is not limited to, fracture or dislocation of the femur or hip on the left side, given age and fall without obvious head strike will rule out intracranial bleeding with CT scan of the head.   The patient is on the cardiac monitor to evaluate for evidence of arrhythmia and/or significant  heart rate changes.  MDM: She sustained a proximal femur fracture just distal to the operative site, contacted Dr. Harlow Mares of orthopedics who is aware.  Given medical comorbidities of type I diabetic on insulin pump will admit to hospitalist service orthopedics to consult and on further operative management.         FINAL CLINICAL IMPRESSION(S) / ED DIAGNOSES   Final diagnoses:  Closed fracture of shaft of left femur, unspecified fracture morphology, initial encounter ([place])     Rx / DC Orders   ED Discharge Orders     None        Note:  This document was prepared using Dragon voice recognition software and may include unintentional dictation errors.    Lucillie Garfinkel, MD 04/24/22 1409

## 2022-04-24 NOTE — Assessment & Plan Note (Signed)
Patient has a history of coronary artery disease Aspirin is on hold for planned surgery Continue Crestor

## 2022-04-24 NOTE — ED Triage Notes (Signed)
Pt arrived to ED from home d/t fall and deformity to her left femur. Pt was just discharged home yesterday from rehab after having surgery on her left hip from a fall. Pt was given 61mg of fentanyl prior to arrival to ED

## 2022-04-24 NOTE — Consult Note (Addendum)
ORTHOPAEDIC CONSULTATION  REQUESTING PHYSICIAN: Lorella Nimrod, MD  Chief Complaint: Left hip pain  HPI: Diana Frost is a 77 y.o. female who complains of left hip pain after fall. The pain is sharp in character. The pain is severe and 10/10. The pain is worse with movement and better with rest. Denies any numbness, tingling or constitutional symptoms.  Past Medical History:  Diagnosis Date   Coronary artery disease    Diabetes mellitus without complication ([place])    insulin pump   Headache    since ear problem started   Hypertension    Myocardial infarction ([place]) 11/01/13   Past Surgical History:  Procedure Laterality Date   ABDOMINAL HYSTERECTOMY     BACK SURGERY     tail bone removed after fall/fracture   CATARACT EXTRACTION BILATERAL W/ ANTERIOR VITRECTOMY     CORONARY ANGIOPLASTY WITH STENT PLACEMENT  11/01/2013   [facility], stent x2   EYE SURGERY     HIP PINNING,CANNULATED Right 01/07/2019   Procedure: CANNULATED HIP PINNING;  Surgeon: Corky Mull, MD;  Location: [facility];  Service: Orthopedics;  Laterality: Right;   HIP PINNING,CANNULATED Left 03/29/2022   Procedure: PERCUTANEOUS FIXATION OF FEMORAL NECK;  Surgeon: Earnestine Leys, MD;  Location: [facility];  Service: Orthopedics;  Laterality: Left;   INTRAMEDULLARY (IM) NAIL INTERTROCHANTERIC Left 04/25/2022   Procedure: INTRAMEDULLARY (IM) NAIL INTERTROCHANTERIC; HARDWARE REMOVAL;  Surgeon: Lovell Sheehan, MD;  Location: [facility];  Service: Orthopedics;  Laterality: Left;   LEFT HEART CATH AND CORONARY ANGIOGRAPHY N/A 08/14/2016   Procedure: Left Heart Cath and Coronary Angiography;  Surgeon: Minna Merritts, MD;  Location: [facility];  Service: Cardiovascular;  Laterality: N/A;   MELANOMA EXCISION Right    arm with skin graft   MYRINGOTOMY WITH TUBE PLACEMENT Left 08/21/2015   Procedure: MYRINGOTOMY WITH TUBE PLACEMENT;  Surgeon: Carloyn Manner, MD;  Location: [facility];  Service: ENT;   Laterality: Left;   NASOPHARYNGEAL BIOPSY N/A 11/07/2015   Procedure: NASOPHARYNGEAL BIOPSY;  Surgeon: Carloyn Manner, MD;  Location: [facility];  Service: ENT;  Laterality: N/A;   TONSILLECTOMY     Social History   Socioeconomic History   Marital status: Married    Spouse name: Not on file   Number of children: Not on file   Years of education: Not on file   Highest education level: Not on file  Occupational History   Not on file  Tobacco Use   Smoking status: Former    Types: Cigarettes    Quit date: 04/30/1998    Years since quitting: 24.0   Smokeless tobacco: Never  Vaping Use   Vaping Use: Never used  Substance and Sexual Activity   Alcohol use: Yes    Alcohol/week: 5.0 standard drinks of alcohol    Types: 5 Glasses of wine per week    Comment:     Drug use: No   Sexual activity: Not on file  Other Topics Concern   Not on file  Social History Narrative   Not on file   Social Determinants of Health   Financial Resource Strain: Not on file  Food Insecurity: No Food Insecurity (04/24/2022)   Hunger Vital Sign    Worried About Running Out of Food in the Last Year: Never true    Ran Out of Food in the Last Year: Never true  Transportation Needs: No Transportation Needs (04/24/2022)   PRAPARE - Hydrologist (Medical): No  Lack of Transportation (Non-Medical): No  Physical Activity: Not on file  Stress: Not on file  Social Connections: Not on file   Family History  Problem Relation Age of Onset   Lung cancer Father    Breast cancer Maternal Aunt    Allergies  Allergen Reactions   Bupropion     Other reaction(s): Other (See Comments) Constipation Tolerating low dose   Percocet [Oxycodone-Acetaminophen] Nausea And Vomiting   Statins     Joint pain   Sulfa Antibiotics Nausea And Vomiting   Prior to Admission medications   Medication Sig Start Date End Date Taking? Authorizing Provider  acetaminophen (TYLENOL) 500 MG tablet Take  2 tablets (1,000 mg total) by mouth 3 (three) times daily. 04/23/22  Yes Love, Ivan Anchors, PA-C  aspirin 81 MG tablet Take 1 tablet (81 mg total) by mouth daily. 04/23/22  Yes Love, Ivan Anchors, PA-C  buPROPion (WELLBUTRIN XL) 150 MG 24 hr tablet Take 1 tablet (150 mg total) by mouth every morning. 04/23/22 04/23/23 Yes Love, Ivan Anchors, PA-C  docusate sodium (COLACE) 100 MG capsule Take 2 capsules (200 mg total) by mouth daily. 04/23/22  Yes Love, Ivan Anchors, PA-C  ferrous sulfate 325 (65 FE) MG tablet Take 1 tablet (325 mg total) by mouth daily with breakfast. 04/03/22  Yes Hosie Poisson, MD  Glucagon, rDNA, (GLUCAGON EMERGENCY IJ) Inject as directed as needed (for low blood sugar).   Yes [provider]  Insulin Human (INSULIN PUMP) SOLN INSULIN PUMP THREE TIMES DAILY. 04/02/22  Yes Hosie Poisson, MD  melatonin 3 MG TABS tablet Take 1 tablet (3 mg total) by mouth at bedtime as needed. 04/23/22  Yes Love, Ivan Anchors, PA-C  menthol-cetylpyridinium (CEPACOL) 3 MG lozenge Take 1 lozenge (3 mg total) by mouth as needed for sore throat. 04/15/22  Yes Love, Ivan Anchors, PA-C  midodrine (PROAMATINE) 5 MG tablet Take 1 tablet (5 mg total) by mouth 3 (three) times daily. 04/23/22  Yes Love, Ivan Anchors, PA-C  rosuvastatin (CRESTOR) 10 MG tablet Take 1 tablet (10 mg total) by mouth at bedtime. 04/23/22  Yes Love, Ivan Anchors, PA-C  senna-docusate (SENOKOT-S) 8.6-50 MG tablet Take 2 tablets by mouth 2 (two) times daily. 04/23/22  Yes Love, Ivan Anchors, PA-C  sertraline (ZOLOFT) 100 MG tablet Take 1 tablet (100 mg total) by mouth daily. 04/23/22  Yes Love, Ivan Anchors, PA-C  zinc gluconate 50 MG tablet Take 1 tablet (50 mg total) by mouth daily. 04/23/22  Yes Love, Ivan Anchors, PA-C  alendronate (FOSAMAX) 70 MG tablet Take 70 mg by mouth once a week. Patient not taking: Reported on 04/24/2022 11/23/20   [provider]  aspirin EC 81 MG tablet Take 1 tablet (81 mg total) by mouth 2 (two) times daily. Swallow whole. 04/29/22   Carlynn Spry, PA-C  docusate sodium (COLACE) 100 MG capsule Take 1 capsule (100 mg total) by mouth 2 (two) times daily. 04/29/22   Carlynn Spry, PA-C  HYDROcodone-acetaminophen (NORCO/VICODIN) 5-325 MG tablet Take 1 tablet by mouth every 4 (four) hours as needed for moderate pain. 04/29/22   Carlynn Spry, PA-C  Vitamin D, Ergocalciferol, (DRISDOL) 1.25 MG (50000 UNIT) CAPS capsule Take 1 capsule (50,000 Units total) by mouth every 7 (seven) days. Patient not taking: Reported on 04/24/2022 04/23/22   Bary Leriche, PA-C   No results found.  Positive ROS: All other systems have been reviewed and were otherwise negative with the exception of those mentioned in the HPI and as  above.  Physical Exam: General: Alert, no acute distress Cardiovascular: No pedal edema Respiratory: No cyanosis, no use of accessory musculature GI: No organomegaly, abdomen is soft and non-tender Skin: No lesions in the area of chief complaint Neurologic: Sensation intact distally Psychiatric: Patient is competent for consent with normal mood and affect Lymphatic: No axillary or cervical lymphadenopathy  MUSCULOSKELETAL: left leg short, externally rotated. Compartments soft. Good cap refill. Motor and sensory intact distally.  Assessment: Left subtrochanteric femur fracture with recent history of hip pinning  Plan: Plan removal of hardware and intramedullary nailing of the left femur.  The diagnosis, risks, benefits and alternatives to treatment are all discussed in detail with the patient and family. Risks include but are not limited to bleeding, infection, deep vein thrombosis, pulmonary embolism, nerve or vascular injury, non-union, repeat operation, persistent pain, weakness, stiffness and death. She understands and is eager to proceed.     Lovell Sheehan, MD    05/01/2022 9:05 AM

## 2022-04-24 NOTE — Assessment & Plan Note (Addendum)
Patient has a history of type 1 diabetes mellitus and has an insulin pump which has been switched off Place patient on consistent carbohydrate diet Glycemic control with sliding scale insulin Check blood sugars AC meals and every 4 while NPO

## 2022-04-24 NOTE — Assessment & Plan Note (Signed)
Continue sertraline and bupropion

## 2022-04-24 NOTE — Assessment & Plan Note (Signed)
Status post mechanical fall with an acute fracture deformity involving the proximal left femur just distal to the lesser trochanter. Patient is status post recent ORIF for a left femoral neck fracture on 03/29/22 Immobilize left lower extremity Pain control Muscle relaxants Consult orthopedic surgery

## 2022-04-24 NOTE — H&P (Signed)
History and Physical    Patient: Diana Frost ATF:[id number] DOB: 1945-12-12 DOA: 04/24/2022 DOS: the patient was seen and examined on 04/24/2022 PCP: Latanya Maudlin, NP  Patient coming from: Home  Chief Complaint:  Chief Complaint  Patient presents with   Fall   HPI: Diana Frost is a 77 y.o. female with medical history significant for insulin-dependent diabetes mellitus, hypertension, coronary artery disease, status post recent left femoral neck fracture with ORIF on 03/29/22 and discharged to acute rehab on 04/02/22.  Patient was discharged home from acute rehab 1 day prior to admission and states that she had gotten up on the day of admission with her walker when her legs tangled up and she fell landing on her left side.  She was unable to get up due to severe pain in her left hip and so EMS was called.  Patient noted to have shortening of her left leg.  She denies any loss of consciousness or any head trauma. X-ray of the left femur shows an acute fracture deformity involves the proximal left femur just distal to the lesser trochanter. Status post ORIF of recent subcapital left femoral neck fracture. She denies having any chest pain, no shortness of breath, no nausea, no vomiting, no abdominal pain, no changes in bowel habits, no fever, no chills, no cough, no dizziness, no lightheadedness, no focal deficit or blurred vision. She received a dose of fentanyl 75 mcg by EMS Orthopedic surgery was consulted in the ER and plan is for surgical repair in a.m. She will be admitted to the hospital for further evaluation  Review of Systems: As mentioned in the history of present illness. All other systems reviewed and are negative. Past Medical History:  Diagnosis Date   Coronary artery disease    Diabetes mellitus without complication ([place])    insulin pump   Headache    since ear problem started   Hypertension    Myocardial infarction ([place]) 11/01/13   Past Surgical History:   Procedure Laterality Date   ABDOMINAL HYSTERECTOMY     BACK SURGERY     tail bone removed after fall/fracture   CATARACT EXTRACTION BILATERAL W/ ANTERIOR VITRECTOMY     CORONARY ANGIOPLASTY WITH STENT PLACEMENT  11/01/2013   [facility], stent x2   EYE SURGERY     HIP PINNING,CANNULATED Right 01/07/2019   Procedure: CANNULATED HIP PINNING;  Surgeon: Corky Mull, MD;  Location: [facility];  Service: Orthopedics;  Laterality: Right;   HIP PINNING,CANNULATED Left 03/29/2022   Procedure: PERCUTANEOUS FIXATION OF FEMORAL NECK;  Surgeon: Earnestine Leys, MD;  Location: [facility];  Service: Orthopedics;  Laterality: Left;   LEFT HEART CATH AND CORONARY ANGIOGRAPHY N/A 08/14/2016   Procedure: Left Heart Cath and Coronary Angiography;  Surgeon: Minna Merritts, MD;  Location: [facility];  Service: Cardiovascular;  Laterality: N/A;   MELANOMA EXCISION Right    arm with skin graft   MYRINGOTOMY WITH TUBE PLACEMENT Left 08/21/2015   Procedure: MYRINGOTOMY WITH TUBE PLACEMENT;  Surgeon: Carloyn Manner, MD;  Location: [facility];  Service: ENT;  Laterality: Left;   NASOPHARYNGEAL BIOPSY N/A 11/07/2015   Procedure: NASOPHARYNGEAL BIOPSY;  Surgeon: Carloyn Manner, MD;  Location: [facility];  Service: ENT;  Laterality: N/A;   TONSILLECTOMY     Social History:  reports that she quit smoking about 24 years ago. Her smoking use included cigarettes. She has never used smokeless tobacco. She reports current alcohol use of about 5.0 standard drinks  of alcohol per week. She reports that she does not use drugs.  Allergies  Allergen Reactions   Bupropion     Other reaction(s): Other (See Comments) Constipation Tolerating low dose   Percocet [Oxycodone-Acetaminophen] Nausea And Vomiting   Statins     Joint pain   Sulfa Antibiotics Nausea And Vomiting    Family History  Problem Relation Age of Onset   Lung cancer Father    Breast cancer Maternal Aunt     Prior to Admission  medications   Medication Sig Start Date End Date Taking? Authorizing Provider  acetaminophen (TYLENOL) 500 MG tablet Take 2 tablets (1,000 mg total) by mouth 3 (three) times daily. 04/23/22  Yes Love, Ivan Anchors, PA-C  aspirin 81 MG tablet Take 1 tablet (81 mg total) by mouth daily. 04/23/22  Yes Love, Ivan Anchors, PA-C  buPROPion (WELLBUTRIN XL) 150 MG 24 hr tablet Take 1 tablet (150 mg total) by mouth every morning. 04/23/22 04/23/23 Yes Love, Ivan Anchors, PA-C  docusate sodium (COLACE) 100 MG capsule Take 2 capsules (200 mg total) by mouth daily. 04/23/22  Yes Love, Ivan Anchors, PA-C  ferrous sulfate 325 (65 FE) MG tablet Take 1 tablet (325 mg total) by mouth daily with breakfast. 04/03/22  Yes Hosie Poisson, MD  Glucagon, rDNA, (GLUCAGON EMERGENCY IJ) Inject as directed as needed (for low blood sugar).   Yes [provider]  Insulin Human (INSULIN PUMP) SOLN INSULIN PUMP THREE TIMES DAILY. 04/02/22  Yes Hosie Poisson, MD  melatonin 3 MG TABS tablet Take 1 tablet (3 mg total) by mouth at bedtime as needed. 04/23/22  Yes Love, Ivan Anchors, PA-C  menthol-cetylpyridinium (CEPACOL) 3 MG lozenge Take 1 lozenge (3 mg total) by mouth as needed for sore throat. 04/15/22  Yes Love, Ivan Anchors, PA-C  midodrine (PROAMATINE) 5 MG tablet Take 1 tablet (5 mg total) by mouth 3 (three) times daily. 04/23/22  Yes Love, Ivan Anchors, PA-C  rosuvastatin (CRESTOR) 10 MG tablet Take 1 tablet (10 mg total) by mouth at bedtime. 04/23/22  Yes Love, Ivan Anchors, PA-C  senna-docusate (SENOKOT-S) 8.6-50 MG tablet Take 2 tablets by mouth 2 (two) times daily. 04/23/22  Yes Love, Ivan Anchors, PA-C  sertraline (ZOLOFT) 100 MG tablet Take 1 tablet (100 mg total) by mouth daily. 04/23/22  Yes Love, Ivan Anchors, PA-C  zinc gluconate 50 MG tablet Take 1 tablet (50 mg total) by mouth daily. 04/23/22  Yes Love, Ivan Anchors, PA-C  alendronate (FOSAMAX) 70 MG tablet Take 70 mg by mouth once a week. Patient not taking: Reported on 04/24/2022 11/23/20   [provider]  Vitamin D, Ergocalciferol, (DRISDOL) 1.25 MG (50000 UNIT) CAPS capsule Take 1 capsule (50,000 Units total) by mouth every 7 (seven) days. Patient not taking: Reported on 04/24/2022 04/23/22   Bary Leriche, PA-C    Physical Exam: Vitals:   04/24/22 1249 04/24/22 1250 04/24/22 1500  BP: (!) 152/86  106/71  Pulse: 97  93  Resp: 16  12  Temp: 97.8 F (36.6 C)    TempSrc: Oral    SpO2: 93%  100%  Weight:  49.9 kg   Height:  '5\' 2"'$  (1.575 m)    Physical Exam Vitals and nursing note reviewed.  Constitutional:      Appearance: Normal appearance.     Comments: Thin and frail.  Appears to be in painful distress  HENT:     Head: Normocephalic and atraumatic.     Nose: Nose normal.  Mouth/Throat:     Mouth: Mucous membranes are dry.  Eyes:     Conjunctiva/sclera: Conjunctivae normal.  Cardiovascular:     Rate and Rhythm: Normal rate and regular rhythm.  Pulmonary:     Effort: Pulmonary effort is normal.     Breath sounds: Normal breath sounds.  Abdominal:     General: Abdomen is flat. Bowel sounds are normal.     Palpations: Abdomen is soft.  Musculoskeletal:     Cervical back: Normal range of motion and neck supple.     Comments: Decreased range of motion left hip.  Left leg is shortened compared to right leg  Skin:    General: Skin is warm and dry.  Neurological:     General: No focal deficit present.     Mental Status: She is alert and oriented to person, place, and time.  Psychiatric:        Mood and Affect: Mood normal.        Behavior: Behavior normal.     Data Reviewed: Relevant notes from primary care and specialist visits, past discharge summaries as available in EHR, including Care Everywhere. Prior diagnostic testing as pertinent to current admission diagnoses Updated medications and problem lists for reconciliation ED course, including vitals, labs, imaging, treatment and response to treatment Triage notes, nursing and pharmacy notes and ED  provider's notes Notable results as noted in HPI Labs reviewed.  Sodium 138, potassium 4.3, chloride 105, bicarb 24, glucose 162, BUN 24, creatinine 0.90, calcium 8.5, white count 6.7, hemoglobin 10.0, hematocrit 31.3, platelet count 246 Chest x-ray reviewed by me shows no evidence of acute cardiopulmonary disease X-ray of the pelvis shows Interval development of a new fracture involving the proximal left femoral shaft just distal to the trochanteric region at the base of the femoral neck pins CT scan of the head without contrast shows no acute intracranial process. Atrophy and chronic microvascular ischemic changes. Twelve-lead EKG reviewed by me shows sinus rhythm, left anterior fascicular block, low voltage QRS There are no new results to review at this time.  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 Patient is status post recent ORIF for a left femoral neck fracture on 03/29/22 Immobilize left lower extremity Pain control Muscle relaxants Consult orthopedic surgery  Coronary artery disease Patient has a history of coronary artery disease Aspirin is on hold for planned surgery Continue Crestor  Depression Continue sertraline and bupropion  Type 1 diabetes mellitus ([place]) Patient has a history of type 1 diabetes mellitus and has an insulin pump which has been switched off Place patient on consistent carbohydrate diet Glycemic control with sliding scale insulin Check blood sugars AC meals and every 4 while NPO      Advance Care Planning:   Code Status: DNR   Consults: Orthopedic surgery  Family Communication: Greater than 50% of time was spent discussing patient's condition and plan of care with her at the bedside.  All questions and concerns have been  addressed.  She verbalizes understanding and agrees to the plan.  CODE STATUS was discussed and she wishes to be a DO NOT RESUSCITATE  Severity of Illness: The appropriate patient status for this patient is INPATIENT. Inpatient status is judged to be reasonable and necessary in order to provide the required intensity of service to ensure the patient's  safety. The patient's presenting symptoms, physical exam findings, and initial radiographic and laboratory data in the context of their chronic comorbidities is felt to place them at high risk for further clinical deterioration. Furthermore, it is not anticipated that the patient will be medically stable for discharge from the hospital within 2 midnights of admission.   * I certify that at the point of admission it is my clinical judgment that the patient will require inpatient hospital care spanning beyond 2 midnights from the point of admission due to high intensity of service, high risk for further deterioration and high frequency of surveillance required.*  Author: Collier Bullock, MD 04/24/2022 4:07 PM  For on call review [email or web address].

## 2022-04-24 NOTE — ED Notes (Signed)
RN to bedside to introduce self to pt. Pt is CAOx4 and in no acute distress. This RN finished cutting her pants off her and placed a purewick. Pt asking for more pain meds. MD already aware and placing admission orders currently.

## 2022-04-24 NOTE — ED Notes (Signed)
Pt attempted to removed soiled brief. Pt adamantly refused due to significant pain.

## 2022-04-25 ENCOUNTER — Inpatient Hospital Stay: Payer: Medicare Other | Admitting: Anesthesiology

## 2022-04-25 ENCOUNTER — Other Ambulatory Visit: Payer: Self-pay

## 2022-04-25 ENCOUNTER — Encounter
Admission: EM | Disposition: A | Discharge status: Skilled Nursing Facility | Payer: Self-pay | Source: Home / Self Care | Attending: Internal Medicine

## 2022-04-25 ENCOUNTER — Inpatient Hospital Stay: Payer: Medicare Other

## 2022-04-25 DIAGNOSIS — S72302K Unspecified fracture of shaft of left femur, subsequent encounter for closed fracture with nonunion: Secondary | ICD-10-CM | POA: Diagnosis not present

## 2022-04-25 HISTORY — PX: INTRAMEDULLARY (IM) NAIL INTERTROCHANTERIC: SHX5875

## 2022-04-25 LAB — BASIC METABOLIC PANEL
Anion gap: 20 — ABNORMAL HIGH (ref 5–15)
BUN: 33 mg/dL — ABNORMAL HIGH (ref 8–23)
CO2: 14 mmol/L — ABNORMAL LOW (ref 22–32)
Calcium: 8.5 mg/dL — ABNORMAL LOW (ref 8.9–10.3)
Chloride: 105 mmol/L (ref 98–111)
Creatinine, Ser: 1.4 mg/dL — ABNORMAL HIGH (ref 0.44–1.00)
GFR, Estimated: 39 mL/min — ABNORMAL LOW (ref >60)
Glucose, Bld: 408 mg/dL — ABNORMAL HIGH (ref 70–99)
Potassium: 4.5 mmol/L (ref 3.5–5.1)
Sodium: 139 mmol/L (ref 135–145)

## 2022-04-25 LAB — CBC
HCT: 29.7 % — ABNORMAL LOW (ref 36.0–46.0)
Hemoglobin: 9.1 g/dL — ABNORMAL LOW (ref 12.0–15.0)
MCH: 31.7 pg (ref 26.0–34.0)
MCHC: 30.6 g/dL (ref 30.0–36.0)
MCV: 103.5 fL — ABNORMAL HIGH (ref 80.0–100.0)
Platelets: 271 10*3/uL (ref 150–400)
RBC: 2.87 MIL/uL — ABNORMAL LOW (ref 3.87–5.11)
RDW: 14.8 % (ref 11.5–15.5)
WBC: 13.3 10*3/uL — ABNORMAL HIGH (ref 4.0–10.5)
nRBC: 0 % (ref 0.0–0.2)

## 2022-04-25 LAB — GLUCOSE, CAPILLARY
Glucose-Capillary: 366 mg/dL — ABNORMAL HIGH (ref 70–99)
Glucose-Capillary: 349 mg/dL — ABNORMAL HIGH (ref 70–99)
Glucose-Capillary: 126 mg/dL — ABNORMAL HIGH (ref 70–99)
Glucose-Capillary: 338 mg/dL — ABNORMAL HIGH (ref 70–99)

## 2022-04-25 SURGERY — FIXATION, FRACTURE, INTERTROCHANTERIC, WITH INTRAMEDULLARY ROD
Anesthesia: General | Site: Hip | Laterality: Left

## 2022-04-25 MED ORDER — CEFAZOLIN SODIUM-DEXTROSE 1-4 GM/50ML-% IV SOLN
1.0000 g | Freq: Four times a day (QID) | INTRAVENOUS | Status: AC
  Administered 2022-04-25 – 2022-04-26 (×3): 1 g via INTRAVENOUS
  Filled 2022-04-25 (×3): qty 50

## 2022-04-25 MED ORDER — ACETAMINOPHEN 10 MG/ML IV SOLN
INTRAVENOUS | Status: DC | PRN
  Administered 2022-04-25: 1000 mg via INTRAVENOUS

## 2022-04-25 MED ORDER — ACETAMINOPHEN 10 MG/ML IV SOLN
INTRAVENOUS | Status: AC
  Filled 2022-04-25: qty 100

## 2022-04-25 MED ORDER — DEXAMETHASONE SODIUM PHOSPHATE 10 MG/ML IJ SOLN
INTRAMUSCULAR | Status: DC | PRN
  Administered 2022-04-25: 5 mg via INTRAVENOUS

## 2022-04-25 MED ORDER — ONDANSETRON HCL 4 MG/2ML IJ SOLN
INTRAMUSCULAR | Status: DC | PRN
  Administered 2022-04-25: 4 mg via INTRAVENOUS

## 2022-04-25 MED ORDER — METOCLOPRAMIDE HCL 5 MG/ML IJ SOLN: 5.0000 mg | Freq: Three times a day (TID) | INTRAMUSCULAR | Status: DC | PRN

## 2022-04-25 MED ORDER — SODIUM CHLORIDE 0.9 % IV SOLN: INTRAVENOUS | Status: DC | PRN

## 2022-04-25 MED ORDER — PROPOFOL 10 MG/ML IV BOLUS
INTRAVENOUS | Status: AC
  Filled 2022-04-25: qty 20

## 2022-04-25 MED ORDER — ACETAMINOPHEN 10 MG/ML IV SOLN: 15.0000 mg/kg | Freq: Once | INTRAVENOUS | Status: DC | PRN

## 2022-04-25 MED ORDER — LIDOCAINE HCL (CARDIAC) PF 100 MG/5ML IV SOSY
PREFILLED_SYRINGE | INTRAVENOUS | Status: DC | PRN
  Administered 2022-04-25: 50 mg via INTRAVENOUS

## 2022-04-25 MED ORDER — ZINC SULFATE 220 (50 ZN) MG PO CAPS
220.0000 mg | ORAL_CAPSULE | Freq: Every day | ORAL | Status: DC
  Administered 2022-04-26 – 2022-05-04 (×9): 220 mg via ORAL
  Filled 2022-04-25 (×10): qty 1

## 2022-04-25 MED ORDER — CEFAZOLIN SODIUM-DEXTROSE 2-4 GM/100ML-% IV SOLN
INTRAVENOUS | Status: AC
  Filled 2022-04-25: qty 100

## 2022-04-25 MED ORDER — SUGAMMADEX SODIUM 200 MG/2ML IV SOLN
INTRAVENOUS | Status: DC | PRN
  Administered 2022-04-25: 99.8 mg via INTRAVENOUS

## 2022-04-25 MED ORDER — PHENYLEPHRINE HCL (PRESSORS) 10 MG/ML IV SOLN
INTRAVENOUS | Status: DC | PRN
  Administered 2022-04-25: 80 ug via INTRAVENOUS
  Administered 2022-04-25: 160 ug via INTRAVENOUS
  Administered 2022-04-25: 80 ug via INTRAVENOUS
  Administered 2022-04-25 (×2): 160 ug via INTRAVENOUS

## 2022-04-25 MED ORDER — LACTATED RINGERS IV SOLN: INTRAVENOUS | Status: DC

## 2022-04-25 MED ORDER — METOCLOPRAMIDE HCL 5 MG PO TABS: 5.0000 mg | ORAL_TABLET | Freq: Three times a day (TID) | ORAL | Status: DC | PRN

## 2022-04-25 MED ORDER — DOCUSATE SODIUM 100 MG PO CAPS
100.0000 mg | ORAL_CAPSULE | Freq: Two times a day (BID) | ORAL | Status: DC
  Administered 2022-04-25 – 2022-05-04 (×18): 100 mg via ORAL
  Filled 2022-04-25 (×18): qty 1

## 2022-04-25 MED ORDER — PROPOFOL 10 MG/ML IV BOLUS
INTRAVENOUS | Status: DC | PRN
  Administered 2022-04-25: 100 mg via INTRAVENOUS

## 2022-04-25 MED ORDER — FENTANYL CITRATE (PF) 100 MCG/2ML IJ SOLN
INTRAMUSCULAR | Status: AC
  Filled 2022-04-25: qty 2

## 2022-04-25 MED ORDER — TRANEXAMIC ACID-NACL 1000-0.7 MG/100ML-% IV SOLN
INTRAVENOUS | Status: AC
  Filled 2022-04-25: qty 100

## 2022-04-25 MED ORDER — ONDANSETRON HCL 4 MG/2ML IJ SOLN: 4.0000 mg | Freq: Once | INTRAMUSCULAR | Status: DC | PRN

## 2022-04-25 MED ORDER — BUPIVACAINE-EPINEPHRINE (PF) 0.25% -1:200000 IJ SOLN
INTRAMUSCULAR | Status: DC | PRN
  Administered 2022-04-25: 30 mL via PERINEURAL

## 2022-04-25 MED ORDER — INSULIN ASPART 100 UNIT/ML IJ SOLN
INTRAMUSCULAR | Status: AC
  Administered 2022-04-25: 15 [IU] via SUBCUTANEOUS
  Filled 2022-04-25: qty 1

## 2022-04-25 MED ORDER — ENOXAPARIN SODIUM 40 MG/0.4ML IJ SOSY
40.0000 mg | PREFILLED_SYRINGE | INTRAMUSCULAR | Status: DC
  Administered 2022-04-26 – 2022-05-04 (×8): 40 mg via SUBCUTANEOUS
  Filled 2022-04-25 (×8): qty 0.4

## 2022-04-25 MED ORDER — INSULIN ASPART 100 UNIT/ML IJ SOLN
0.0000 [IU] | INTRAMUSCULAR | Status: DC
  Administered 2022-04-25 – 2022-04-26 (×2): 11 [IU] via SUBCUTANEOUS
  Administered 2022-04-26: 3 [IU] via SUBCUTANEOUS
  Administered 2022-04-26: 8 [IU] via SUBCUTANEOUS
  Administered 2022-04-26: 2 [IU] via SUBCUTANEOUS
  Administered 2022-04-26: 3 [IU] via SUBCUTANEOUS
  Administered 2022-04-27: 11 [IU] via SUBCUTANEOUS
  Administered 2022-04-27: 2 [IU] via SUBCUTANEOUS
  Administered 2022-04-27: 5 [IU] via SUBCUTANEOUS
  Filled 2022-04-25 (×9): qty 1

## 2022-04-25 MED ORDER — ONDANSETRON HCL 4 MG PO TABS
4.0000 mg | ORAL_TABLET | Freq: Four times a day (QID) | ORAL | Status: DC | PRN
  Administered 2022-05-02: 4 mg via ORAL
  Filled 2022-04-25: qty 1

## 2022-04-25 MED ORDER — 0.9 % SODIUM CHLORIDE (POUR BTL) OPTIME
TOPICAL | Status: DC | PRN
  Administered 2022-04-25: 1000 mL

## 2022-04-25 MED ORDER — FENTANYL CITRATE (PF) 100 MCG/2ML IJ SOLN
INTRAMUSCULAR | Status: DC | PRN
  Administered 2022-04-25 (×2): 50 ug via INTRAVENOUS

## 2022-04-25 MED ORDER — ROCURONIUM BROMIDE 100 MG/10ML IV SOLN
INTRAVENOUS | Status: DC | PRN
  Administered 2022-04-25: 10 mg via INTRAVENOUS
  Administered 2022-04-25: 40 mg via INTRAVENOUS

## 2022-04-25 MED ORDER — ONDANSETRON HCL 4 MG/2ML IJ SOLN: 4.0000 mg | Freq: Four times a day (QID) | INTRAMUSCULAR | Status: DC | PRN

## 2022-04-25 MED ORDER — FENTANYL CITRATE (PF) 100 MCG/2ML IJ SOLN: 25.0000 ug | INTRAMUSCULAR | Status: DC | PRN

## 2022-04-25 SURGICAL SUPPLY — 47 items
BIT DRILL CANN 16 HIP (BIT) IMPLANT
BIT DRILL CANN STP 6/9 HIP (BIT) IMPLANT
BIT DRILL SHORT 4.2 (BIT) IMPLANT
BLADE TFNA HELICAL 75 (Anchor) IMPLANT
BNDG COHESIVE 4X5 TAN STRL LF (GAUZE/BANDAGES/DRESSINGS) IMPLANT
BRUSH SCRUB EZ  4% CHG (MISCELLANEOUS) ×2
BRUSH SCRUB EZ 4% CHG (MISCELLANEOUS) ×2 IMPLANT
CHLORAPREP W/TINT 26 (MISCELLANEOUS) ×1 IMPLANT
DRAPE 3/4 80X56 (DRAPES) ×1 IMPLANT
DRAPE IMP U-DRAPE 54X76 (DRAPES) IMPLANT
DRAPE U-SHAPE 47X51 STRL (DRAPES) ×1 IMPLANT
DRILL BIT SHORT 4.2 (BIT) ×1
DRSG AQUACEL AG ADV 3.5X 4 (GAUZE/BANDAGES/DRESSINGS) IMPLANT
DRSG AQUACEL AG ADV 3.5X10 (GAUZE/BANDAGES/DRESSINGS) ×1 IMPLANT
ELECT REM PT RETURN 9FT ADLT (ELECTROSURGICAL) ×1
ELECTRODE REM PT RTRN 9FT ADLT (ELECTROSURGICAL) ×1 IMPLANT
GAUZE XEROFORM 1X8 LF (GAUZE/BANDAGES/DRESSINGS) ×1 IMPLANT
GLOVE BIOGEL PI IND STRL 8.5 (GLOVE) ×1 IMPLANT
GLOVE SURG ORTHO 8.5 STRL (GLOVE) ×1 IMPLANT
GOWN STRL REUS W/ TWL LRG LVL3 (GOWN DISPOSABLE) ×1 IMPLANT
GOWN STRL REUS W/ TWL XL LVL3 (GOWN DISPOSABLE) ×1 IMPLANT
GOWN STRL REUS W/TWL LRG LVL3 (GOWN DISPOSABLE)
GOWN STRL REUS W/TWL XL LVL3 (GOWN DISPOSABLE) ×3
GUIDEWIRE 3.2X400 (WIRE) IMPLANT
KIT PATIENT CARE HANA TABLE (KITS) ×1 IMPLANT
KIT TURNOVER CYSTO (KITS) ×1 IMPLANT
MANIFOLD NEPTUNE II (INSTRUMENTS) ×1 IMPLANT
MAT ABSORB  FLUID 56X50 GRAY (MISCELLANEOUS) ×1
MAT ABSORB FLUID 56X50 GRAY (MISCELLANEOUS) ×1 IMPLANT
NAIL 9MM/130 TI CANN TFNA 340 (Nail) IMPLANT
NDL SPNL 20GX3.5 QUINCKE YW (NEEDLE) ×1 IMPLANT
NEEDLE SPNL 20GX3.5 QUINCKE YW (NEEDLE) ×1 IMPLANT
NS IRRIG 1000ML POUR BTL (IV SOLUTION) ×1 IMPLANT
PACK HIP COMPR (MISCELLANEOUS) ×1 IMPLANT
REAMER ROD DEEP FLUTE 2.5X950 (INSTRUMENTS) IMPLANT
SCREW LOCK STAR 5X38 (Screw) IMPLANT
SCREW LOCK STAR 5X42 (Screw) IMPLANT
STAPLER SKIN PROX 35W (STAPLE) ×1 IMPLANT
SUT VIC AB 0 CT1 36 (SUTURE) ×1 IMPLANT
SUT VIC AB 2-0 CT1 27 (SUTURE) ×2
SUT VIC AB 2-0 CT1 TAPERPNT 27 (SUTURE) ×2 IMPLANT
SUT VIC AB 2-0 CT2 27 (SUTURE) IMPLANT
SYR 30ML LL (SYRINGE) ×1 IMPLANT
TFN Advanced Helical blades
TOWEL OR 17X26 4PK STRL BLUE (TOWEL DISPOSABLE) ×1 IMPLANT
TRAP FLUID SMOKE EVACUATOR (MISCELLANEOUS) ×1 IMPLANT
WATER STERILE IRR 500ML POUR (IV SOLUTION) ×1 IMPLANT

## 2022-04-25 NOTE — Progress Notes (Signed)
Gave surgical report to Huntley Estelle, RN in [place].

## 2022-04-25 NOTE — Progress Notes (Signed)
Patient alert and oriented x4. Gave patient pain med po robaxin last night and IV morphine this morning. Patient refuses mobility. Left hip swollen and has bruises generally over body. Performed CHG bath, new gown, pads changed and SCDs placed. Consents signed and ANCEF at bedside for surgery.

## 2022-04-25 NOTE — Transfer of Care (Signed)
Immediate Anesthesia Transfer of Care Note  Patient: Diana Frost  Procedure(s) Performed: Procedure(s): INTRAMEDULLARY (IM) NAIL INTERTROCHANTERIC; HARDWARE REMOVAL (Left)  Patient Location: PACU  Anesthesia Type:General  Level of Consciousness: sedated  Airway & Oxygen Therapy: Patient Spontanous Breathing and Patient connected to face mask oxygen  Post-op Assessment: Report given to RN and Post -op Vital signs reviewed and stable  Post vital signs: Reviewed and stable  Last Vitals:  Vitals:   04/25/22 0453 04/25/22 1012  BP: 128/60 (!) 128/58  Pulse: (!) 109 (!) 105  Resp: 20 16  Temp: 36.9 C (!) 36.3 C  SpO2: 60% 165%    Complications: No apparent anesthesia complications

## 2022-04-25 NOTE — Anesthesia Postprocedure Evaluation (Signed)
Anesthesia Post Note  Patient: Diana Frost  Procedure(s) Performed: INTRAMEDULLARY (IM) NAIL INTERTROCHANTERIC; HARDWARE REMOVAL (Left: Hip)  Patient location during evaluation: PACU Anesthesia Type: General Level of consciousness: awake and alert, oriented and patient cooperative Pain management: pain level controlled Vital Signs Assessment: post-procedure vital signs reviewed and stable Respiratory status: spontaneous breathing, nonlabored ventilation and respiratory function stable Cardiovascular status: blood pressure returned to baseline and stable Postop Assessment: adequate PO intake Anesthetic complications: no   No notable events documented.   Last Vitals:  Vitals:   04/25/22 1045 04/25/22 1100  BP: (!) 120/55 (!) 109/47  Pulse: (!) 109 (!) 109  Resp: 14 17  Temp:    SpO2: 98% 99%    Last Pain:  Vitals:   04/25/22 1030  TempSrc:   PainSc: 0-No pain                 Darrin Nipper

## 2022-04-25 NOTE — Op Note (Signed)
DATE OF SURGERY:  04/25/2022  TIME: 10:17 AM  PATIENT NAME:  Diana Frost  AGE: 77 y.o.  PRE-OPERATIVE DIAGNOSIS:  Fracture  POST-OPERATIVE DIAGNOSIS:  SAME  PROCEDURE:  Left INTRAMEDULLARY (IM) NAIL INTERTROCHANTERIC; HARDWARE REMOVAL  SURGEON:  Lovell Sheehan  EBL:  267 cc  COMPLICATIONS:  none apparent  OPERATIVE IMPLANTS: Synthes trochanteric femoral nail  9 mm by 340 mm  with interlocking helical blade  75 mm and two 5.0 mm distal interlocking screws.  EXPLANTED: Four 6.5 mm cannulated screws removed  PREOPERATIVE INDICATIONS:  Diana Frost is a 77 y.o. year old who fell and suffered a hip fracture. She was brought into the ER and then admitted and optimized and then elected for surgical intervention.    The risks benefits and alternatives were discussed with the patient including but not limited to the risks of nonoperative treatment, versus surgical intervention including infection, bleeding, nerve injury, malunion, nonunion, hardware prominence, hardware failure, need for hardware removal, blood clots, cardiopulmonary complications, morbidity, mortality, among others, and they were willing to proceed.    OPERATIVE PROCEDURE:  The patient was brought to the operating room and placed in the supine position.  General anesthesia was administered. She was placed on the fracture table.  Closed reduction was performed under C-arm guidance. The length of the femur was also measured using fluoroscopy. Time out was then performed after sterile prep and drape. She received preoperative antibiotics.  Through a lateral 4 cm incision, four partially threaded 6.5 mm screws were removed and passed from the field.  Incision was made proximal to the greater trochanter. A guidewire was placed in the appropriate position. Confirmation was made on AP and lateral views. The above-named nail was opened. I opened the proximal femur with a reamer. I then placed the nail by hand easily down.  I did not need to ream the femur.  Once the nail was completely seated, I placed a guidepin into the femoral head into the center center position through a second incision.  I measured the length, and then reamed the lateral cortex and up into the head. I then placed the helical blade. Anatomic fixation achieved. Bone quality was poor.  I then secured the proximal interlock.  Using a perfect circle technique two distal 5.0 mm interlocks were placed.    I then removed the instruments, and took final C-arm pictures AP and lateral the entire length of the leg. Anatomic reconstruction was achieved, and the wounds were irrigated copiously and closed with Vicryl  followed by staples and dry sterile dressing. Sponge and needle count were correct.   The patient was awakened and returned to PACU in stable and satisfactory condition. There no complications and the patient tolerated the procedure well.  She will be 50% PARTIAL WEIGHT BEARING    Lovell Sheehan

## 2022-04-25 NOTE — Progress Notes (Signed)
PROGRESS NOTE    Diana Frost  OMB:[id number]  DOB: 01-24-1946  DOA: 04/24/2022 PCP: Latanya Maudlin, NP Outpatient Specialists:   Hospital course:  77 year old female with DM2, HTN, CAD s/p recent left femoral neck fracture s/p ORIF 03/29/2022 was readmitted yesterday with another fall again on her left hip and new left proximal femur fracture.   Subjective:  Patient states that she feels much better after surgery "my left hip is not hurting anymore".   Objective: Vitals:   04/25/22 1100 04/25/22 1115 04/25/22 1150 04/25/22 1227  BP: (!) 109/47 (!) 99/56 (!) 96/50 (!) 94/52  Pulse: (!) 109 (!) 109 (!) 109 99  Resp: '17 14 16 16  '$ Temp:  98 F (36.7 C) 98.1 F (36.7 C) 97.6 F (36.4 C)  TempSrc:      SpO2: 99% 97% 97% 98%  Weight:      Height:        Intake/Output Summary (Last 24 hours) at 04/25/2022 1339 Last data filed at 04/25/2022 1011 Gross per 24 hour  Intake 940 ml  Output 300 ml  Net 640 ml   Filed Weights   04/24/22 1250  Weight: 49.9 kg     Exam:  General: Sleepy female lying in bed in NAD, arousable to voice alone Eyes: sclera anicteric, conjuctiva mild injection bilaterally CVS: S1-S2, regular  Respiratory:  decreased air entry bilaterally secondary to decreased inspiratory effort, rales at bases  GI: NABS, soft, NT  LE: Warm and well-perfused Neuro: A/O x 3,  grossly nonfocal.  Psych: patient is logical and coherent, judgement and insight appear normal, mood and affect appropriate to situation.  Data Reviewed:  Basic Metabolic Panel: Recent Labs  Lab 04/20/22 0538 04/24/22 1309 04/25/22 1156  NA 138 138 139  K 3.9 4.3 4.5  CL 100 105 105  CO2 29 24 14*  GLUCOSE 220* 162* 408*  BUN 17 24* 33*  CREATININE 0.83 0.90 1.40*  CALCIUM 8.9 8.5* 8.5*    CBC: Recent Labs  Lab 04/20/22 0538 04/24/22 1309 04/25/22 1156  WBC 5.9 6.7 13.3*  NEUTROABS  --  5.3  --   HGB 10.2* 10.0* 9.1*  HCT 31.7* 31.3* 29.7*  MCV 98.8 98.4  103.5*  PLT 262 246 271     Scheduled Meds:  acetaminophen  1,000 mg Oral TID   buPROPion  150 mg Oral q morning   docusate sodium  100 mg Oral BID   [START ON 04/26/2022] enoxaparin (LOVENOX) injection  40 mg Subcutaneous Q24H   ferrous sulfate  325 mg Oral Q breakfast   insulin aspart  0-15 Units Subcutaneous TID WC   midodrine  5 mg Oral TID with meals   mupirocin ointment  1 Application Nasal BID   rosuvastatin  10 mg Oral QHS   senna-docusate  2 tablet Oral BID   sertraline  100 mg Oral Daily   Vitamin D (Ergocalciferol)  50,000 Units Oral Q7 days   zinc sulfate  220 mg Oral Daily   Continuous Infusions:   ceFAZolin (ANCEF) IV     methocarbamol (ROBAXIN) IV       Assessment & Plan:   Recurrent left hip fracture Patient is s/p left IM nail removal earlier today Patient states her pain is much improved Further management per orthopedics  DM 1 Patient is usually on an insulin pump which has presently been switched off Will start patient back on her pump once she is awake and eating better Blood sugars are markedly  elevated today compared to yesterday, in the 300s Will change CBG monitoring and SSI insulin to every 4 hours to avoid hyperglycemia/DKA  CAD Restart aspirin when deemed safe by orthopedics  Depression Continue sertraline and bupropion    DVT prophylaxis: SCD Code Status: DNR    Studies: DG HIP UNILAT WITH PELVIS 2-3 VIEWS LEFT  Result Date: 04/25/2022 CLINICAL DATA:  98921 Fracture [id number] EXAM: DG HIP (WITH OR WITHOUT PELVIS) 2-3V LEFT COMPARISON:  04/24/2022 FINDINGS: Intraoperative imaging demonstrates internal fixation across the proximal femoral shaft fracture. Anatomic alignment. No visible hardware bony complicating feature. IMPRESSION: Internal fixation.  No visible complicating feature. Electronically Signed   By: Rolm Baptise M.D.   On: 04/25/2022 10:09   DG C-Arm 1-60 Min-No Report  Result Date: 04/25/2022 Fluoroscopy was utilized by the  requesting physician.  No radiographic interpretation.   CT Head Wo Contrast  Result Date: 04/24/2022 CLINICAL DATA:  Patient with head trauma EXAM: CT HEAD WITHOUT CONTRAST TECHNIQUE: Contiguous axial images were obtained from the base of the skull through the vertex without intravenous contrast. RADIATION DOSE REDUCTION: This exam was performed according to the departmental dose-optimization program which includes automated exposure control, adjustment of the mA and/or kV according to patient size and/or use of iterative reconstruction technique. COMPARISON:  Brain CT 10/10/2021 FINDINGS: Brain: Atrophy. Periventricular and subcortical white matter hypodensities compatible with chronic microvascular ischemic changes. No evidence for acute cortically based infarct, intracranial hemorrhage, mass lesion or mass-effect. No evidence for acute cortically based infarct, intracranial hemorrhage, mass lesion or mass-effect. Vascular: No hyperdense vessel or unexpected calcification. Skull: Intact. Sinuses/Orbits: Mucosal thickening involving the maxillary sinuses. Small amount of fluid within the left mastoid air cells. Small amount of fluid within the sphenoid sinus. Orbits are unremarkable. Other: Soft tissue swelling overlying the right calvarium. IMPRESSION: 1. No acute intracranial process. 2. Atrophy and chronic microvascular ischemic changes. Electronically Signed   By: Lovey Newcomer M.D.   On: 04/24/2022 13:51   DG Femur Min 2 Views Left  Result Date: 04/24/2022 CLINICAL DATA:  Status post fall.  Recent left hip pinning. EXAM: LEFT FEMUR 2 VIEWS COMPARISON:  04/16/2022 FINDINGS: Postoperative changes from recent ORIF of recent subcapital left femoral neck fracture. Three cannulated screws are noted reducing the hip fracture. There is an acute fracture deformity involving the proximal left femur just distal to the lesser trochanter. There is may scratch set there is medial angulation of the distal fracture  fragments. The mid and distal femur appear intact. IMPRESSION: 1. Acute fracture deformity involves the proximal left femur just distal to the lesser trochanter. 2. Status post ORIF of recent subcapital left femoral neck fracture. Electronically Signed   By: Kerby Moors M.D.   On: 04/24/2022 13:45   DG Pelvis 1-2 Views  Result Date: 04/24/2022 CLINICAL DATA:  Pain after fall EXAM: PELVIS - 1VIEW COMPARISON:  Fluoroscopy 03/28/2022 and older FINDINGS: Osteopenia. Bilateral oblique femoral neck screws are in place. The femoral neck fracture on the left has patent lucency. Stable alignment. However there is a new oblique angulated fracture noted along the proximal femoral shaft just distal to the trochanteric region at the level of the base of the screws on the left. Severe underlying osteopenia pre vascular calcifications. Mild multilevel joint space loss particularly of the hip joints. IMPRESSION: Interval development of a new fracture involving the proximal left femoral shaft just distal to the trochanteric region at the base of the femoral neck pins. Electronically Signed   By: Roosevelt Locks  Lyndel Safe M.D.   On: 04/24/2022 13:42   DG Chest 1 View  Result Date: 04/24/2022 CLINICAL DATA:  Recent fall EXAM: CHEST  1 VIEW COMPARISON:  Chest x-ray March 28, 2022 FINDINGS: The cardiomediastinal silhouette is unchanged in contour. No focal pulmonary opacity. No pleural effusion or pneumothorax. The visualized upper abdomen is unremarkable. No acute osseous abnormality. IMPRESSION: No acute cardiopulmonary abnormality. Electronically Signed   By: Beryle Flock M.D.   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

## 2022-04-25 NOTE — OR Nursing (Signed)
2 41m 6.568m32 thread explaned

## 2022-04-25 NOTE — Progress Notes (Signed)
Initial Nutrition Assessment  DOCUMENTATION CODES:  Not applicable  INTERVENTION:  Advance to regular diet after surgery.  No need for carb modified as pt is a type 1 diabetic with insulin pump and good baseline glycemic control Added stop date to ordered zinc to prevent over-supplementing and causing a copper deficiency  NUTRITION DIAGNOSIS:   Increased nutrient needs related to post-op healing as evidenced by estimated needs.  GOAL:   Patient will meet greater than or equal to 90% of their needs  MONITOR:   PO intake, Diet advancement, Labs, Weight trends  REASON FOR ASSESSMENT:   Consult Hip fracture protocol  ASSESSMENT:   Pt with hx of DM type 1, CAD with hx of MI, and HTN presented to ED with pain after a fall at home. Pt was discharged from [facility] the day prior after receiving rehab from a fall and left femur fracture. Imaging in ED showed a new proximal femur fracture just distal to the operative site  Noted that pt was seen by [facility] RD 5 years ago and met criteria for malnutrition at that time.   Pt in OR at the time of assessment. Unable to obtain a nutrition hx over the phone. Noted that pt is a type 1 DM with insulin pump. Last HgbA1c 12/27 was 7.2% which is in desirable range for her age. After surgery, recommend a regular diet.   Reviewed weight hx, some gain and then loss noted, but overall weight has been stable for ~5 years. Will follow-up on meal intake trends to determine if nutrition supplements are needed to meet needs.   Vitamin D checked 1/2 and was 35.4 (WNL). Noted Ergocalciferol and zinc ordered with no stop date.   Nutritionally Relevant Medications: Scheduled Meds:  ferrous sulfate  325 mg Oral Q breakfast   insulin aspart  0-15 Units Subcutaneous TID WC   rosuvastatin  10 mg Oral QHS   senna-docusate  2 tablet Oral BID   Vitamin D (Ergocalciferol)  50,000 Units Oral Q7 days   zinc sulfate  220 mg Oral Daily   PRN Meds: ondansetron   Labs  Reviewed HgbA1c 7.2% 12/27 Vitamin D 35.4 1/2  NUTRITION - FOCUSED PHYSICAL EXAM: Defer to in-person assessment  Diet Order:   Diet Order             Diet NPO time specified  Diet effective midnight                   EDUCATION NEEDS:  Not appropriate for education at this time  Skin:  Skin Assessment: Reviewed RN Assessment  Last BM:  prior to admission  Height:  Ht Readings from Last 1 Encounters:  04/24/22 '5\' 2"'$  (1.575 m)    Weight:  Wt Readings from Last 1 Encounters:  04/24/22 49.9 kg    Ideal Body Weight:  50 kg  BMI:  Body mass index is 20.12 kg/m.  Estimated Nutritional Needs:  Kcal:  1500-1700 kcal/d Protein: 70-90g/d Fluid:  >/=1.5L/d    Ranell Patrick, RD, LDN Clinical Dietitian RD pager # available in [facility]  After hours/weekend pager # available in [place]

## 2022-04-25 NOTE — Anesthesia Procedure Notes (Signed)
Procedure Name: Intubation Date/Time: 04/25/2022 8:07 AM  Performed by: Aline Brochure, CRNAPre-anesthesia Checklist: Patient identified, Patient being monitored, Timeout performed, Emergency Drugs available and Suction available Patient Re-evaluated:Patient Re-evaluated prior to induction Oxygen Delivery Method: Circle system utilized Preoxygenation: Pre-oxygenation with 100% oxygen Induction Type: IV induction Ventilation: Mask ventilation without difficulty Laryngoscope Size: Mac, 3 and McGraph Grade View: Grade I Tube type: Oral Tube size: 7.0 mm Number of attempts: 1 Airway Equipment and Method: Stylet Placement Confirmation: ETT inserted through vocal cords under direct vision, positive ETCO2 and breath sounds checked- equal and bilateral Secured at: 21 cm Tube secured with: Tape Dental Injury: Teeth and Oropharynx as per pre-operative assessment

## 2022-04-25 NOTE — Anesthesia Preprocedure Evaluation (Signed)
Anesthesia Evaluation  Patient identified by MRN, date of birth, ID band Patient awake    Reviewed: Allergy & Precautions, NPO status , Patient's Chart, lab work & pertinent test results  History of Anesthesia Complications Negative for: history of anesthetic complications  Airway Mallampati: IV   Neck ROM: Full    Dental  (+) Missing   Pulmonary former smoker   Pulmonary exam normal breath sounds clear to auscultation       Cardiovascular hypertension, + CAD (s/p MI and stents)  Normal cardiovascular exam Rhythm:Regular Rate:Normal  ECG 01/15/22:  NORMAL SINUS RHYTHM  LOW VOLTAGE QRS    Neuro/Psych  Headaches    GI/Hepatic negative GI ROS,,,  Endo/Other  diabetes, Type 1, Insulin Dependent    Renal/GU Renal disease     Musculoskeletal   Abdominal   Peds  Hematology  (+) Blood dyscrasia, anemia   Anesthesia Other Findings   Reproductive/Obstetrics                              Anesthesia Physical Anesthesia Plan  ASA: 3  Anesthesia Plan: General   Post-op Pain Management:    Induction: Intravenous  PONV Risk Score and Plan: 3 and Ondansetron, Dexamethasone and Treatment may vary due to age or medical condition  Airway Management Planned: Oral ETT  Additional Equipment:   Intra-op Plan:   Post-operative Plan: Extubation in OR  Informed Consent: I have reviewed the patients History and Physical, chart, labs and discussed the procedure including the risks, benefits and alternatives for the proposed anesthesia with the patient or authorized representative who has indicated his/her understanding and acceptance.   Patient has DNR.  Discussed DNR with patient and Suspend DNR.   Dental advisory given  Plan Discussed with: CRNA  Anesthesia Plan Comments: (Patient desires GETA over spinal.  Patient consented for risks of anesthesia including but not limited to:  - adverse  reactions to medications - damage to eyes, teeth, lips or other oral mucosa - nerve damage due to positioning  - sore throat or hoarseness - damage to heart, brain, nerves, lungs, other parts of body or loss of life  Informed patient about role of CRNA in peri- and intra-operative care.  Patient voiced understanding.)         Anesthesia Quick Evaluation

## 2022-04-26 DIAGNOSIS — S72302K Unspecified fracture of shaft of left femur, subsequent encounter for closed fracture with nonunion: Secondary | ICD-10-CM | POA: Diagnosis not present

## 2022-04-26 LAB — CBC
HCT: 23.1 % — ABNORMAL LOW (ref 36.0–46.0)
Hemoglobin: 7.3 g/dL — ABNORMAL LOW (ref 12.0–15.0)
MCH: 31.5 pg (ref 26.0–34.0)
MCHC: 31.6 g/dL (ref 30.0–36.0)
MCV: 99.6 fL (ref 80.0–100.0)
Platelets: 186 K/uL (ref 150–400)
RBC: 2.32 MIL/uL — ABNORMAL LOW (ref 3.87–5.11)
RDW: 15 % (ref 11.5–15.5)
WBC: 11.3 K/uL — ABNORMAL HIGH (ref 4.0–10.5)
nRBC: 0 % (ref 0.0–0.2)

## 2022-04-26 LAB — BASIC METABOLIC PANEL WITH GFR
Anion gap: 10 (ref 5–15)
BUN: 35 mg/dL — ABNORMAL HIGH (ref 8–23)
CO2: 23 mmol/L (ref 22–32)
Calcium: 8.3 mg/dL — ABNORMAL LOW (ref 8.9–10.3)
Chloride: 105 mmol/L (ref 98–111)
Creatinine, Ser: 1.16 mg/dL — ABNORMAL HIGH (ref 0.44–1.00)
GFR, Estimated: 49 mL/min — ABNORMAL LOW (ref >60)
Glucose, Bld: 147 mg/dL — ABNORMAL HIGH (ref 70–99)
Potassium: 3.8 mmol/L (ref 3.5–5.1)
Sodium: 138 mmol/L (ref 135–145)

## 2022-04-26 LAB — GLUCOSE, CAPILLARY
Glucose-Capillary: 149 mg/dL — ABNORMAL HIGH (ref 70–99)
Glucose-Capillary: 172 mg/dL — ABNORMAL HIGH (ref 70–99)
Glucose-Capillary: 186 mg/dL — ABNORMAL HIGH (ref 70–99)
Glucose-Capillary: 267 mg/dL — ABNORMAL HIGH (ref 70–99)
Glucose-Capillary: 308 mg/dL — ABNORMAL HIGH (ref 70–99)
Glucose-Capillary: 78 mg/dL (ref 70–99)

## 2022-04-26 MED ORDER — SODIUM CHLORIDE 0.9 % IV SOLN: INTRAVENOUS | Status: AC

## 2022-04-26 NOTE — Progress Notes (Signed)
Subjective:  Patient reports pain as moderate.  Family is in the room.   Objective:   VITALS:   Vitals:   04/26/22 0440 04/26/22 0639 04/26/22 0757 04/26/22 1249  BP: (!) 100/46 (!) 100/56 (!) 97/52 100/63  Pulse: (!) 106 (!) 101 (!) 109 99  Resp:   17 16  Temp:   99.7 F (37.6 C) 97.8 F (36.6 C)  TempSrc:      SpO2:   97% 100%  Weight:      Height:        PHYSICAL EXAM:  ABD soft Sensation intact distally Dorsiflexion/Plantar flexion intact Incision: dressing C/D/I No cellulitis present Compartment soft  LABS  Results for orders placed or performed during the hospital encounter of 04/24/22 (from the past 24 hour(s))  Glucose, capillary     Status: Abnormal   Collection Time: 04/25/22  3:37 PM  Result Value Ref Range   Glucose-Capillary 126 (H) 70 - 99 mg/dL  Glucose, capillary     Status: Abnormal   Collection Time: 04/25/22  7:57 PM  Result Value Ref Range   Glucose-Capillary 338 (H) 70 - 99 mg/dL   Comment 1 Notify RN   Glucose, capillary     Status: Abnormal   Collection Time: 04/26/22 12:04 AM  Result Value Ref Range   Glucose-Capillary 149 (H) 70 - 99 mg/dL   Comment 1 Notify RN   Glucose, capillary     Status: None   Collection Time: 04/26/22  3:58 AM  Result Value Ref Range   Glucose-Capillary 78 70 - 99 mg/dL   Comment 1 Notify RN   Basic metabolic panel     Status: Abnormal   Collection Time: 04/26/22  4:56 AM  Result Value Ref Range   Sodium 138 135 - 145 mmol/L   Potassium 3.8 3.5 - 5.1 mmol/L   Chloride 105 98 - 111 mmol/L   CO2 23 22 - 32 mmol/L   Glucose, Bld 147 (H) 70 - 99 mg/dL   BUN 35 (H) 8 - 23 mg/dL   Creatinine, Ser 1.16 (H) 0.44 - 1.00 mg/dL   Calcium 8.3 (L) 8.9 - 10.3 mg/dL   GFR, Estimated 49 (L) >60 mL/min   Anion gap 10 5 - 15  CBC     Status: Abnormal   Collection Time: 04/26/22  4:56 AM  Result Value Ref Range   WBC 11.3 (H) 4.0 - 10.5 K/uL   RBC 2.32 (L) 3.87 - 5.11 MIL/uL   Hemoglobin 7.3 (L) 12.0 - 15.0 g/dL    HCT 23.1 (L) 36.0 - 46.0 %   MCV 99.6 80.0 - 100.0 fL   MCH 31.5 26.0 - 34.0 pg   MCHC 31.6 30.0 - 36.0 g/dL   RDW 15.0 11.5 - 15.5 %   Platelets 186 150 - 400 K/uL   nRBC 0.0 0.0 - 0.2 %  Glucose, capillary     Status: Abnormal   Collection Time: 04/26/22  7:34 AM  Result Value Ref Range   Glucose-Capillary 308 (H) 70 - 99 mg/dL  Glucose, capillary     Status: Abnormal   Collection Time: 04/26/22 11:40 AM  Result Value Ref Range   Glucose-Capillary 172 (H) 70 - 99 mg/dL    DG HIP UNILAT WITH PELVIS 2-3 VIEWS LEFT  Result Date: 04/25/2022 CLINICAL DATA:  [id number] Fracture 96051 EXAM: DG HIP (WITH OR WITHOUT PELVIS) 2-3V LEFT COMPARISON:  04/24/2022 FINDINGS: Intraoperative imaging demonstrates internal fixation across the proximal femoral shaft fracture. Anatomic alignment.  No visible hardware bony complicating feature. IMPRESSION: Internal fixation.  No visible complicating feature. Electronically Signed   By: Rolm Baptise M.D.   On: 04/25/2022 10:09   DG C-Arm 1-60 Min-No Report  Result Date: 04/25/2022 Fluoroscopy was utilized by the requesting physician.  No radiographic interpretation.    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

## 2022-04-26 NOTE — Evaluation (Signed)
Occupational Therapy Evaluation Patient Details Name: Diana Frost MRN: [id number] DOB: [birth year] Today's Date: 04/26/2022   History of Present Illness Pt is a 77 y/o F admitted on 04/24/22 following a fall. Pt with recent L femoral neck fx & s/p ORIF 03/29/22. Pt now with L proximal femur fx. Pt underwent L IM placement & hardware removal by Dr. Harlow Mares on 04/25/22. PMH: DM1 on insulin pump, CAD, HTN   Clinical Impression   Patient presenting with decreased independence in self care, balance, functional mobility/transfers, endurance, and safety awareness. Pt recently D/C from CIR and was supervision level overall for ADL tasks. Pt lives with spouse who is disabled and has a daughter who is able to assist PRN with ADLs/IADLs. Pt A&Ox3 this date. She was able to follow commands with increased time and required VC for safety awareness t/o. Pt currently functioning at [facility] for bed mobility, Mod A for LB dressing, and set up-supervision for seated grooming tasks. Pt deferred standing this date 2/2 dizziness. Dizziness improved with time, however, pt afraid of having another episode of emesis (occurred during PT eval). Pt c/o significant pain in LLE, RN aware. Pt will benefit from acute OT to increase overall independence in the areas of ADLs and functional mobility in order to safely discharge to next venue of care. Upon hospital discharge, recommend STR to maximize pt safety and return to PLOF.      Recommendations for follow up therapy are one component of a multi-disciplinary discharge planning process, led by the attending physician.  Recommendations may be updated based on patient status, additional functional criteria and insurance authorization.   Follow Up Recommendations  Skilled nursing-short term rehab (<3 hours/day)     Assistance Recommended at Discharge Frequent or constant Supervision/Assistance  Patient can return home with the following A lot of help with  bathing/dressing/bathroom;A lot of help with walking and/or transfers;Assistance with cooking/housework;Assist for transportation;Help with stairs or ramp for entrance;Direct supervision/assist for financial management;Direct supervision/assist for medications management    Functional Status Assessment  Patient has had a recent decline in their functional status and demonstrates the ability to make significant improvements in function in a reasonable and predictable amount of time.  Equipment Recommendations  Other (comment) (defer to next venue of care)    Recommendations for Other Services       Precautions / Restrictions Precautions Precautions: Fall Restrictions Weight Bearing Restrictions: Yes LLE Weight Bearing: Partial weight bearing LLE Partial Weight Bearing Percentage or Pounds: 50%      Mobility Bed Mobility Overal bed mobility: Needs Assistance Bed Mobility: Supine to Sit, Sit to Supine     Supine to sit: Mod assist, HOB elevated (Increased time/effort required, use of bed rails, assist to guide LLE to EOB and for trunk elevation) Sit to supine: Min assist (assist to manage LLE)   General bed mobility comments: Pt unable to complete lateral scoot at EOB before returning to supine.    Transfers                   General transfer comment: NT, pt deferred 2/2 dizziness (improved with time, but was afraid of having another episode of vomiting as in PT session earlier)      Balance Overall balance assessment: Needs assistance Sitting-balance support: Feet supported, Bilateral upper extremity supported Sitting balance-Leahy Scale: Fair  ADL either performed or assessed with clinical judgement   ADL Overall ADL's : Needs assistance/impaired Eating/Feeding: Sitting;Modified independent   Grooming: Set up;Supervision/safety;Sitting;Wash/dry face;Brushing hair               Lower Body Dressing:  Moderate assistance Lower Body Dressing Details (indicate cue type and reason): able to adjust R sock at bed level before completing OOB mobility, assist for LLE                     Vision Baseline Vision/History: 1 Wears glasses Patient Visual Report: No change from baseline       Perception     Praxis      Pertinent Vitals/Pain Pain Assessment Pain Assessment: Faces Faces Pain Scale: Hurts whole lot Pain Location: LLE Pain Descriptors / Indicators: Discomfort, Grimacing Pain Intervention(s): Monitored during session, Premedicated before session, Repositioned     Hand Dominance Right   Extremity/Trunk Assessment Upper Extremity Assessment Upper Extremity Assessment: Generalized weakness   Lower Extremity Assessment Lower Extremity Assessment: Generalized weakness;LLE deficits/detail LLE Deficits / Details: PWB LLE: Unable to fully assess due to pain       Communication Communication Communication: No difficulties   Cognition Arousal/Alertness: Awake/alert Behavior During Therapy: WFL for tasks assessed/performed Overall Cognitive Status: No family/caregiver present to determine baseline cognitive functioning                                 General Comments: Pt is AxOx3 (disoriented to location). Pt follows simple commands with extra time throughout session. Demonstrates decreased safety awareness.     General Comments       Exercises Other Exercises Other Exercises: OT provided education re: role of OT, OT POC, post acute recs, sitting up for all meals, EOB/OOB mobility with assistance, home/fall safety, LLE PWB    Shoulder Instructions      Home Living Family/patient expects to be discharged to:: Private residence Living Arrangements: Spouse/significant other Available Help at Discharge: Family;Available PRN/intermittently (pt's spouse is non ambulatory 2/2 hx of CVA, daughter helping PRN) Type of Home: House Home Access: Ramped  entrance     Home Layout: Two level;Able to live on main level with bedroom/bathroom;Full bath on main level     Bathroom Shower/Tub: Walk-in shower;Tub/shower unit;Door   Bathroom Toilet: Handicapped height     Home Equipment: Conservation officer, nature (2 wheels);Rollator (4 wheels);Cane - single point          Prior Functioning/Environment Prior Level of Function : Needs assist;History of Falls (last six months)             Mobility Comments: Pt d/c home from CIR 1 day prior to admission at w/c level, ambulating very short distances with RW & supervision. ADLs Comments: Normally IND. Pt recently D/C from CIR and was supervision level for ADL tasks, daughter assisting PRN with IADLs        OT Problem List: Decreased strength;Decreased range of motion;Decreased activity tolerance;Impaired balance (sitting and/or standing);Pain;Decreased safety awareness;Decreased knowledge of use of DME or AE;Decreased knowledge of precautions;Decreased cognition      OT Treatment/Interventions: Self-care/ADL training;Therapeutic exercise;Therapeutic activities;Energy conservation;Cognitive remediation/compensation;DME and/or AE instruction;Patient/family education;Balance training    OT Goals(Current goals can be found in the care plan section) Acute Rehab OT Goals Patient Stated Goal: reduce pain OT Goal Formulation: With patient Time For Goal Achievement: 05/10/22 Potential to Achieve Goals: Fair   OT Frequency: Min 2X/week  Co-evaluation              AM-PAC OT "6 Clicks" Daily Activity     Outcome Measure Help from another person eating meals?: None Help from another person taking care of personal grooming?: A Little Help from another person toileting, which includes using toliet, bedpan, or urinal?: A Lot Help from another person bathing (including washing, rinsing, drying)?: A Lot Help from another person to put on and taking off regular upper body clothing?: A Little Help from  another person to put on and taking off regular lower body clothing?: A Lot 6 Click Score: 16   End of Session Nurse Communication: Mobility status;Precautions;Weight bearing status  Activity Tolerance: Patient limited by fatigue;Patient limited by pain Patient left: in bed;with call bell/phone within reach;with bed alarm set  OT Visit Diagnosis: Pain;Other abnormalities of gait and mobility (R26.89);Muscle weakness (generalized) (M62.81);History of falling (Z91.81) Pain - Right/Left: Left Pain - part of body: Hip                Time: 9774-1423 OT Time Calculation (min): 24 min Charges:  OT General Charges $OT Visit: 1 Visit OT Evaluation $OT Eval Moderate Complexity: 1 Mod  [facility] MS, OTR/L ascom [phone]  04/26/22, 11:54 AM

## 2022-04-26 NOTE — Plan of Care (Signed)
  Problem: Activity: ?Goal: Risk for activity intolerance will decrease ?Outcome: Progressing ?  ?Problem: Nutrition: ?Goal: Adequate nutrition will be maintained ?Outcome: Progressing ?  ?Problem: Elimination: ?Goal: Will not experience complications related to urinary retention ?Outcome: Progressing ?  ?Problem: Pain Managment: ?Goal: General experience of comfort will improve ?Outcome: Progressing ?  ?

## 2022-04-26 NOTE — Evaluation (Signed)
Physical Therapy Evaluation Patient Details Name: Diana Frost MRN: [id number] DOB: [birth year] Today's Date: 04/26/2022  History of Present Illness  Pt is a 77 y/o F admitted on 04/24/22 following a fall. Pt with recent L femoral neck fx & s/p ORIF 03/29/22. Pt now with L proximal femur fx. Pt underwent L IM placement & hardware removal by Dr. Harlow Mares on 04/25/22. PMH: DM1 on insulin pump, CAD, HTN  Clinical Impression  Pt seen for PT evaluation with pt agreeable. Pt reports she recently d/c from CIR to home & was attempting to walk with RW while carrying two 6 packs of drinks under either arm when she fell. Pt is AxOx3 during session, initially believing herself to be at [place] requesting to go to [place] club with PT orienting pt & pt able to recall by end or session. Pt requires mod assist to complete supine>sit but max assist to fully scoot to sitting EOB. Pt initially reports dizziness that improves, worsens again with standing but then improves with sitting. Pt attempts STS x 2-3 times with RW & mod assist with pt demonstrating significant pain in LLE with weight bearing. Pt unable to tolerate standing, nor take steps. PT attempted to initiate lateral scoot to recliner but pt begins vomiting. Nurse called to room & pt assisted to bed with +2 assist. Nurse made aware of events of session. Pt would benefit from STR upon d/c to maximize independence with functional mobility & reduce fall risk prior to return home.   Recommendations for follow up therapy are one component of a multi-disciplinary discharge planning process, led by the attending physician.  Recommendations may be updated based on patient status, additional functional criteria and insurance authorization.  Follow Up Recommendations Skilled nursing-short term rehab (<3 hours/day) Can patient physically be transported by private vehicle: No    Assistance Recommended at Discharge Frequent or constant Supervision/Assistance   Patient can return home with the following  Direct supervision/assist for medications management;Help with stairs or ramp for entrance;A lot of help with walking and/or transfers;A lot of help with bathing/dressing/bathroom;Assist for transportation;Assistance with cooking/housework;Direct supervision/assist for financial management    Equipment Recommendations None recommended by PT (TBD in next venue)  Recommendations for Other Services  OT consult    Functional Status Assessment       Precautions / Restrictions Precautions Precautions: Fall Restrictions Weight Bearing Restrictions: Yes LLE Weight Bearing: Partial weight bearing LLE Partial Weight Bearing Percentage or Pounds: 50%      Mobility  Bed Mobility Overal bed mobility: Needs Assistance Bed Mobility: Supine to Sit, Sit to Supine     Supine to sit: Mod assist, HOB elevated (Extra time to complete, cuing to continue participating in movement, bed rails.) Sit to supine: Total assist, +2 for physical assistance (Total assist +2 2/2 pt vomiting)        Transfers Overall transfer level: Needs assistance Equipment used: Rolling walker (2 wheels) Transfers: Sit to/from Stand Sit to Stand: Mod assist           General transfer comment: STS from EOB x 2-3 times with cuing for hand placement and pushing to stand, assistance to power up to standing.    Ambulation/Gait                  Stairs            Wheelchair Mobility    Modified Rankin (Stroke Patients Only)       Balance Overall balance assessment: Needs assistance Sitting-balance  support: Feet supported, Bilateral upper extremity supported Sitting balance-Leahy Scale: Poor   Postural control: Left lateral lean Standing balance support: During functional activity, Bilateral upper extremity supported, Reliant on assistive device for balance Standing balance-Leahy Scale: Poor                               Pertinent  Vitals/Pain Pain Assessment Pain Assessment: Faces Faces Pain Scale: Hurts whole lot Pain Location: LLE Pain Descriptors / Indicators: Discomfort, Grimacing Pain Intervention(s): Monitored during session, Repositioned    Home Living Family/patient expects to be discharged to:: Private residence Living Arrangements: Spouse/significant other Available Help at Discharge: Family;Available PRN/intermittently (pt's spouse is non ambulatory 2/2 hx of CVA, daughter helping PRN) Type of Home: House Home Access: Ramped entrance       Home Layout: Two level;Able to live on main level with bedroom/bathroom;Full bath on main level Home Equipment: Rolling Walker (2 wheels);Rollator (4 wheels);Cane - single point      Prior Function               Mobility Comments: Pt d/c home from CIR 1 day prior to admission at w/c level, ambulating very short distances with RW & supervision.       Hand Dominance        Extremity/Trunk Assessment   Upper Extremity Assessment Upper Extremity Assessment: Generalized weakness    Lower Extremity Assessment Lower Extremity Assessment: Generalized weakness;LLE deficits/detail LLE Deficits / Details: 3-/5 knee extension in sitting       Communication   Communication: No difficulties  Cognition Arousal/Alertness: Awake/alert Behavior During Therapy: WFL for tasks assessed/performed Overall Cognitive Status: No family/caregiver present to determine baseline cognitive functioning                                 General Comments: Pt is AxOx3, upon PT arrival pt asking to be wheeled out the door so she can join her [place] group, believing she's at [employer]. PT educated pt on location with pt able to recall by end of session. Pt follows simple commands with extra time throughout session. Demonstrates decreased safety awareness.        General Comments      Exercises     Assessment/Plan    PT Assessment Patient needs continued  PT services  PT Problem List Decreased strength;Decreased coordination;Pain;Decreased range of motion;Decreased activity tolerance;Decreased cognition;Decreased knowledge of use of DME;Decreased balance;Decreased safety awareness;Decreased mobility;Decreased knowledge of precautions;Decreased skin integrity       PT Treatment Interventions DME instruction;Therapeutic exercise;Gait training;Balance training;Wheelchair mobility training;Stair training;Neuromuscular re-education;Functional mobility training;Cognitive remediation;Therapeutic activities;Patient/family education;Manual techniques;Modalities    PT Goals (Current goals can be found in the Care Plan section)  Acute Rehab PT Goals Patient Stated Goal: decreased pain PT Goal Formulation: With patient Time For Goal Achievement: 05/10/22 Potential to Achieve Goals: Fair    Frequency BID     Co-evaluation               AM-PAC PT "6 Clicks" Mobility  Outcome Measure Help needed turning from your back to your side while in a flat bed without using bedrails?: A Lot Help needed moving from lying on your back to sitting on the side of a flat bed without using bedrails?: Total Help needed moving to and from a bed to a chair (including a wheelchair)?: Total Help needed standing up from a chair  using your arms (e.g., wheelchair or bedside chair)?: Total Help needed to walk in hospital room?: Total Help needed climbing 3-5 steps with a railing? : Total 6 Click Score: 7    End of Session   Activity Tolerance: Treatment limited secondary to medical complications (Comment) Patient left: in bed;with nursing/sitter in room Nurse Communication: Mobility status (events of session) PT Visit Diagnosis: Pain;Difficulty in walking, not elsewhere classified (R26.2);Other abnormalities of gait and mobility (R26.89);Muscle weakness (generalized) (M62.81);History of falling (Z91.81);Unsteadiness on feet (R26.81) Pain - Right/Left: Left Pain -  part of body: Hip    Time: 4628-6381 PT Time Calculation (min) (ACUTE ONLY): 24 min   Charges:   PT Evaluation $PT Eval Moderate Complexity: 1 Mod PT Treatments $Therapeutic Activity: 8-22 mins        [name], PT, DPT 04/26/22, 9:33 AM   [name] 04/26/2022, 9:30 AM

## 2022-04-26 NOTE — Progress Notes (Signed)
PROGRESS NOTE    Diana Frost  [id number]  DOB: 01-28-1946  DOA: 04/24/2022 PCP: Latanya Maudlin, NP Outpatient Specialists:   Hospital course:  77 year old female with DM2, HTN, CAD s/p recent left femoral neck fracture s/p ORIF 03/29/2022 was readmitted yesterday with another fall again on her left hip and new left proximal femur fracture.   Subjective:  Informed by nurse that patient is somewhat confused this morning--has been talking about her cats and quilts.  Patient herself denies confusion but then states "I thought you had taken me to a quilt show where they were showing 3 of my best quilts but then when just told me that I was at the hospital, I know that I am in the hospital now.  But I did see my friend just in the cafeteria earlier today".  Patient denies being in pain   Objective: Vitals:   04/26/22 0354 04/26/22 0440 04/26/22 0639 04/26/22 0757  BP: (!) 97/53 (!) 100/46 (!) 100/56 (!) 97/52  Pulse: (!) 106 (!) 106 (!) 101 (!) 109  Resp: 16   17  Temp: 98.5 F (36.9 C)   99.7 F (37.6 C)  TempSrc: Oral     SpO2: 97%   97%  Weight:      Height:        Intake/Output Summary (Last 24 hours) at 04/26/2022 8016 Last data filed at 04/26/2022 0740 Gross per 24 hour  Intake 650 ml  Output 950 ml  Net -300 ml    Filed Weights   04/24/22 1250  Weight: 49.9 kg     Exam:  General: Patient is more awake than yesterday, speaks to me coherently but as per history clearly has some delirium. Eyes: sclera anicteric, conjuctiva mild injection bilaterally CVS: S1-S2, regular  Respiratory:  decreased air entry bilaterally secondary to decreased inspiratory effort, rales at bases  GI: NABS, soft, NT  LE: Warm and well-perfused  Data Reviewed:  Basic Metabolic Panel: Recent Labs  Lab 04/20/22 0538 04/24/22 1309 04/25/22 1156 04/26/22 0456  NA 138 138 139 138  K 3.9 4.3 4.5 3.8  CL 100 105 105 105  CO2 29 24 14* 23  GLUCOSE 220* 162* 408* 147*   BUN 17 24* 33* 35*  CREATININE 0.83 0.90 1.40* 1.16*  CALCIUM 8.9 8.5* 8.5* 8.3*     CBC: Recent Labs  Lab 04/20/22 0538 04/24/22 1309 04/25/22 1156 04/26/22 0456  WBC 5.9 6.7 13.3* 11.3*  NEUTROABS  --  5.3  --   --   HGB 10.2* 10.0* 9.1* 7.3*  HCT 31.7* 31.3* 29.7* 23.1*  MCV 98.8 98.4 103.5* 99.6  PLT 262 246 271 186      Scheduled Meds:  acetaminophen  1,000 mg Oral TID   buPROPion  150 mg Oral q morning   docusate sodium  100 mg Oral BID   enoxaparin (LOVENOX) injection  40 mg Subcutaneous Q24H   ferrous sulfate  325 mg Oral Q breakfast   insulin aspart  0-15 Units Subcutaneous Q4H   midodrine  5 mg Oral TID with meals   mupirocin ointment  1 Application Nasal BID   rosuvastatin  10 mg Oral QHS   senna-docusate  2 tablet Oral BID   sertraline  100 mg Oral Daily   Vitamin D (Ergocalciferol)  50,000 Units Oral Q7 days   zinc sulfate  220 mg Oral Daily   Continuous Infusions:  methocarbamol (ROBAXIN) IV       Assessment & Plan:  Recurrent left hip fracture Patient is s/p left IM nail removal earlier today Patient states her pain is much improved Further management per orthopedics  ABLA Hemoglobin is down to 7.3 today, down from 9-10. Blood pressure was also relatively lower than previously Will treat with gentle normal saline at 100 x 24 hours and recheck CBC in the morning Would have low threshold for transfusion if hemoglobin is less than 7.3 tomorrow  Delirium Patient with some postop delirium, no evidence of infection, no recent use of IV pain management although she is on p.o. hydrocodone. Lower BP and anemia may also be contributory--spoke to see improvement with IV fluid resuscitation overnight and possible transfusion tomorrow Patient is placed on delirium precautions  AKI Creatinine is improving, anticipate further improvement with gentle IV fluids overnight  DM 1 Given patient's clear delirium, will not start patient on insulin pump  today Continue close monitoring of patient's blood sugars with fingersticks every 4 hours Goal is for reasonable control to avoid blood sugars that are too high or too low rather than tight control until patient is placed back on her pump. Will continue present management with fingersticks every 4 with SSI as needed  CAD Restart aspirin when deemed safe by orthopedics  Depression Continue sertraline and bupropion    DVT prophylaxis: SCD Code Status: DNR    Studies: DG HIP UNILAT WITH PELVIS 2-3 VIEWS LEFT  Result Date: 04/25/2022 CLINICAL DATA:  91916 Fracture [id number] EXAM: DG HIP (WITH OR WITHOUT PELVIS) 2-3V LEFT COMPARISON:  04/24/2022 FINDINGS: Intraoperative imaging demonstrates internal fixation across the proximal femoral shaft fracture. Anatomic alignment. No visible hardware bony complicating feature. IMPRESSION: Internal fixation.  No visible complicating feature. Electronically Signed   By: Rolm Baptise M.D.   On: 04/25/2022 10:09   DG C-Arm 1-60 Min-No Report  Result Date: 04/25/2022 Fluoroscopy was utilized by the requesting physician.  No radiographic interpretation.   CT Head Wo Contrast  Result Date: 04/24/2022 CLINICAL DATA:  Patient with head trauma EXAM: CT HEAD WITHOUT CONTRAST TECHNIQUE: Contiguous axial images were obtained from the base of the skull through the vertex without intravenous contrast. RADIATION DOSE REDUCTION: This exam was performed according to the departmental dose-optimization program which includes automated exposure control, adjustment of the mA and/or kV according to patient size and/or use of iterative reconstruction technique. COMPARISON:  Brain CT 10/10/2021 FINDINGS: Brain: Atrophy. Periventricular and subcortical white matter hypodensities compatible with chronic microvascular ischemic changes. No evidence for acute cortically based infarct, intracranial hemorrhage, mass lesion or mass-effect. No evidence for acute cortically based infarct,  intracranial hemorrhage, mass lesion or mass-effect. Vascular: No hyperdense vessel or unexpected calcification. Skull: Intact. Sinuses/Orbits: Mucosal thickening involving the maxillary sinuses. Small amount of fluid within the left mastoid air cells. Small amount of fluid within the sphenoid sinus. Orbits are unremarkable. Other: Soft tissue swelling overlying the right calvarium. IMPRESSION: 1. No acute intracranial process. 2. Atrophy and chronic microvascular ischemic changes. Electronically Signed   By: Lovey Newcomer M.D.   On: 04/24/2022 13:51   DG Femur Min 2 Views Left  Result Date: 04/24/2022 CLINICAL DATA:  Status post fall.  Recent left hip pinning. EXAM: LEFT FEMUR 2 VIEWS COMPARISON:  04/16/2022 FINDINGS: Postoperative changes from recent ORIF of recent subcapital left femoral neck fracture. Three cannulated screws are noted reducing the hip fracture. There is an acute fracture deformity involving the proximal left femur just distal to the lesser trochanter. There is may scratch set there is medial angulation of the  distal fracture fragments. The mid and distal femur appear intact. IMPRESSION: 1. Acute fracture deformity involves the proximal left femur just distal to the lesser trochanter. 2. Status post ORIF of recent subcapital left femoral neck fracture. Electronically Signed   By: Kerby Moors M.D.   On: 04/24/2022 13:45   DG Pelvis 1-2 Views  Result Date: 04/24/2022 CLINICAL DATA:  Pain after fall EXAM: PELVIS - 1VIEW COMPARISON:  Fluoroscopy 03/28/2022 and older FINDINGS: Osteopenia. Bilateral oblique femoral neck screws are in place. The femoral neck fracture on the left has patent lucency. Stable alignment. However there is a new oblique angulated fracture noted along the proximal femoral shaft just distal to the trochanteric region at the level of the base of the screws on the left. Severe underlying osteopenia pre vascular calcifications. Mild multilevel joint space loss particularly  of the hip joints. IMPRESSION: Interval development of a new fracture involving the proximal left femoral shaft just distal to the trochanteric region at the base of the femoral neck pins. Electronically Signed   By: Jill Side M.D.   On: 04/24/2022 13:42   DG Chest 1 View  Result Date: 04/24/2022 CLINICAL DATA:  Recent fall EXAM: CHEST  1 VIEW COMPARISON:  Chest x-ray March 28, 2022 FINDINGS: The cardiomediastinal silhouette is unchanged in contour. No focal pulmonary opacity. No pleural effusion or pneumothorax. The visualized upper abdomen is unremarkable. No acute osseous abnormality. IMPRESSION: No acute cardiopulmonary abnormality. Electronically Signed   By: Beryle Flock M.D.   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

## 2022-04-26 NOTE — Plan of Care (Signed)
  Problem: Education: Goal: Verbalization of understanding the information provided (i.e., activity precautions, restrictions, etc) will improve Outcome: Progressing   Problem: Activity: Goal: Ability to ambulate and perform ADLs will improve Outcome: Progressing   Problem: Clinical Measurements: Goal: Postoperative complications will be avoided or minimized Outcome: Progressing   Problem: Self-Concept: Goal: Ability to maintain and perform role responsibilities to the fullest extent possible will improve Outcome: Progressing   Problem: Pain Management: Goal: Pain level will decrease Outcome: Progressing   Problem: Education: Goal: Ability to describe self-care measures that may prevent or decrease complications (Diabetes Survival Skills Education) will improve Outcome: Progressing   Problem: Coping: Goal: Ability to adjust to condition or change in health will improve Outcome: Progressing   Problem: Fluid Volume: Goal: Ability to maintain a balanced intake and output will improve Outcome: Progressing   Problem: Health Behavior/Discharge Planning: Goal: Ability to identify and utilize available resources and services will improve Outcome: Progressing Goal: Ability to manage health-related needs will improve Outcome: Progressing   Problem: Metabolic: Goal: Ability to maintain appropriate glucose levels will improve Outcome: Progressing   Problem: Nutritional: Goal: Maintenance of adequate nutrition will improve Outcome: Progressing Goal: Progress toward achieving an optimal weight will improve Outcome: Progressing   Problem: Skin Integrity: Goal: Risk for impaired skin integrity will decrease Outcome: Progressing   Problem: Tissue Perfusion: Goal: Adequacy of tissue perfusion will improve Outcome: Progressing   Problem: Education: Goal: Knowledge of General Education information will improve Description: Including pain rating scale, medication(s)/side effects  and non-pharmacologic comfort measures Outcome: Progressing   Problem: Health Behavior/Discharge Planning: Goal: Ability to manage health-related needs will improve Outcome: Progressing   Problem: Clinical Measurements: Goal: Ability to maintain clinical measurements within normal limits will improve Outcome: Progressing Goal: Will remain free from infection Outcome: Progressing Goal: Diagnostic test results will improve Outcome: Progressing Goal: Respiratory complications will improve Outcome: Progressing Goal: Cardiovascular complication will be avoided Outcome: Progressing   Problem: Activity: Goal: Risk for activity intolerance will decrease Outcome: Progressing   Problem: Nutrition: Goal: Adequate nutrition will be maintained Outcome: Progressing   Problem: Coping: Goal: Level of anxiety will decrease Outcome: Progressing   Problem: Elimination: Goal: Will not experience complications related to bowel motility Outcome: Progressing Goal: Will not experience complications related to urinary retention Outcome: Progressing   Problem: Pain Managment: Goal: General experience of comfort will improve Outcome: Progressing   Problem: Safety: Goal: Ability to remain free from injury will improve Outcome: Progressing   Problem: Skin Integrity: Goal: Risk for impaired skin integrity will decrease Outcome: Progressing

## 2022-04-26 NOTE — Progress Notes (Deleted)
PROGRESS NOTE    Diana Frost  YJE:[id number]  DOB: [birth year]  DOA: 04/24/2022 PCP: Latanya Maudlin, NP Outpatient Specialists:   Hospital course:  77 year old female with DM2, HTN, CAD s/p recent left femoral neck fracture s/p ORIF 03/29/2022 was readmitted yesterday with another fall again on her left hip and new left proximal femur fracture.   Subjective:  Patient states she is sleepy.  Denies any pain.   Exam:  General: Sleepy patient lying in bed, arousable by voice alone, answers yes no questions appropriately. Eyes: sclera anicteric, conjuctiva mild injection bilaterally CVS: S1-S2, regular  Respiratory:  decreased air entry bilaterally secondary to decreased inspiratory effort, rales at bases  GI: NABS, soft, NT  LE: Warm and well-perfused  Data Reviewed:  Basic Metabolic Panel: Recent Labs  Lab 04/20/22 0538 04/24/22 1309 04/25/22 1156 04/26/22 0456  NA 138 138 139 138  K 3.9 4.3 4.5 3.8  CL 100 105 105 105  CO2 29 24 14* 23  GLUCOSE 220* 162* 408* 147*  BUN 17 24* 33* 35*  CREATININE 0.83 0.90 1.40* 1.16*  CALCIUM 8.9 8.5* 8.5* 8.3*    CBC: Recent Labs  Lab 04/20/22 0538 04/24/22 1309 04/25/22 1156 04/26/22 0456  WBC 5.9 6.7 13.3* 11.3*  NEUTROABS  --  5.3  --   --   HGB 10.2* 10.0* 9.1* 7.3*  HCT 31.7* 31.3* 29.7* 23.1*  MCV 98.8 98.4 103.5* 99.6  PLT 262 246 271 186     Scheduled Meds:  acetaminophen  1,000 mg Oral TID   buPROPion  150 mg Oral q morning   docusate sodium  100 mg Oral BID   enoxaparin (LOVENOX) injection  40 mg Subcutaneous Q24H   ferrous sulfate  325 mg Oral Q breakfast   insulin aspart  0-15 Units Subcutaneous Q4H   midodrine  5 mg Oral TID with meals   mupirocin ointment  1 Application Nasal BID   rosuvastatin  10 mg Oral QHS   senna-docusate  2 tablet Oral BID   sertraline  100 mg Oral Daily   Vitamin D (Ergocalciferol)  50,000 Units Oral Q7 days   zinc sulfate  220 mg Oral Daily   Continuous  Infusions:  sodium chloride     methocarbamol (ROBAXIN) IV       Assessment & Plan:   Expand All Collapse All     PROGRESS NOTE       Diana Frost  OVZ:[id number]  DOB: April 30, 1945  DOA: 04/24/2022 PCP: Latanya Maudlin, NP Outpatient Specialists:     Hospital course:   77 year old female with DM2, HTN, CAD s/p recent left femoral neck fracture s/p ORIF 03/29/2022 was readmitted yesterday with another fall again on her left hip and new left proximal femur fracture.     Subjective:   Informed by nurse that patient is somewhat confused this morning--has been talking about her cats and quilts.  Patient herself denies confusion but then states "I thought you had taken me to a quilt show where they were showing 3 of my best quilts but then when just told me that I was at the hospital, I know that I am in the hospital now.  But I did see my friend just in the cafeteria earlier today".  Patient denies being in pain      Exam:   General: Patient is more awake than yesterday, speaks to me coherently but as per history clearly has some delirium. Eyes: sclera anicteric, conjuctiva mild injection  bilaterally CVS: S1-S2, regular  Respiratory:  decreased air entry bilaterally secondary to decreased inspiratory effort, rales at bases  GI: NABS, soft, NT  LE: Warm and well-perfused       Scheduled Meds:  acetaminophen  1,000 mg Oral TID   buPROPion  150 mg Oral q morning   docusate sodium  100 mg Oral BID   enoxaparin (LOVENOX) injection  40 mg Subcutaneous Q24H   ferrous sulfate  325 mg Oral Q breakfast   insulin aspart  0-15 Units Subcutaneous Q4H   midodrine  5 mg Oral TID with meals   mupirocin ointment  1 Application Nasal BID   rosuvastatin  10 mg Oral QHS   senna-docusate  2 tablet Oral BID   sertraline  100 mg Oral Daily   Vitamin D (Ergocalciferol)  50,000 Units Oral Q7 days   zinc sulfate  220 mg Oral Daily    Continuous Infusions:  methocarbamol (ROBAXIN) IV           Assessment & Plan:   Recurrent left hip fracture Patient is s/p left IM nail removal earlier today Patient states her pain is much improved Further management per orthopedics   AKI Creatinine increased, will need to follow closely Patient is not on any nephrotoxins   DM 1 Goals are somewhat labile, patient likely has brittle diabetes given DM1  Will want to avoid not only hypoglycemia as well as significant hyperglycemia as would like to avoid DKA. Will increase fingerstick checks to every 4 hours and follow closely Patient can be restarted back on her insulin pump when she is awake and alert enough to manage on her own per protocol   CAD Restart aspirin when deemed safe by orthopedics   Depression Continue sertraline and bupropion       DVT prophylaxis: SCD Code Status: DNR        Studies: DG HIP UNILAT WITH PELVIS 2-3 VIEWS LEFT  Result Date: 04/25/2022 CLINICAL DATA:  93810 Fracture [id number] EXAM: DG HIP (WITH OR WITHOUT PELVIS) 2-3V LEFT COMPARISON:  04/24/2022 FINDINGS: Intraoperative imaging demonstrates internal fixation across the proximal femoral shaft fracture. Anatomic alignment. No visible hardware bony complicating feature. IMPRESSION: Internal fixation.  No visible complicating feature. Electronically Signed   By: Rolm Baptise M.D.   On: 04/25/2022 10:09   DG C-Arm 1-60 Min-No Report  Result Date: 04/25/2022 Fluoroscopy was utilized by the requesting physician.  No radiographic interpretation.   CT Head Wo Contrast  Result Date: 04/24/2022 CLINICAL DATA:  Patient with head trauma EXAM: CT HEAD WITHOUT CONTRAST TECHNIQUE: Contiguous axial images were obtained from the base of the skull through the vertex without intravenous contrast. RADIATION DOSE REDUCTION: This exam was performed according to the departmental dose-optimization program which includes automated exposure control, adjustment of the mA and/or kV according to patient size and/or use of  iterative reconstruction technique. COMPARISON:  Brain CT 10/10/2021 FINDINGS: Brain: Atrophy. Periventricular and subcortical white matter hypodensities compatible with chronic microvascular ischemic changes. No evidence for acute cortically based infarct, intracranial hemorrhage, mass lesion or mass-effect. No evidence for acute cortically based infarct, intracranial hemorrhage, mass lesion or mass-effect. Vascular: No hyperdense vessel or unexpected calcification. Skull: Intact. Sinuses/Orbits: Mucosal thickening involving the maxillary sinuses. Small amount of fluid within the left mastoid air cells. Small amount of fluid within the sphenoid sinus. Orbits are unremarkable. Other: Soft tissue swelling overlying the right calvarium. IMPRESSION: 1. No acute intracranial process. 2. Atrophy and chronic microvascular ischemic changes. Electronically Signed  By: Lovey Newcomer M.D.   On: 04/24/2022 13:51   DG Femur Min 2 Views Left  Result Date: 04/24/2022 CLINICAL DATA:  Status post fall.  Recent left hip pinning. EXAM: LEFT FEMUR 2 VIEWS COMPARISON:  04/16/2022 FINDINGS: Postoperative changes from recent ORIF of recent subcapital left femoral neck fracture. Three cannulated screws are noted reducing the hip fracture. There is an acute fracture deformity involving the proximal left femur just distal to the lesser trochanter. There is may scratch set there is medial angulation of the distal fracture fragments. The mid and distal femur appear intact. IMPRESSION: 1. Acute fracture deformity involves the proximal left femur just distal to the lesser trochanter. 2. Status post ORIF of recent subcapital left femoral neck fracture. Electronically Signed   By: Kerby Moors M.D.   On: 04/24/2022 13:45   DG Pelvis 1-2 Views  Result Date: 04/24/2022 CLINICAL DATA:  Pain after fall EXAM: PELVIS - 1VIEW COMPARISON:  Fluoroscopy 03/28/2022 and older FINDINGS: Osteopenia. Bilateral oblique femoral neck screws are in place.  The femoral neck fracture on the left has patent lucency. Stable alignment. However there is a new oblique angulated fracture noted along the proximal femoral shaft just distal to the trochanteric region at the level of the base of the screws on the left. Severe underlying osteopenia pre vascular calcifications. Mild multilevel joint space loss particularly of the hip joints. IMPRESSION: Interval development of a new fracture involving the proximal left femoral shaft just distal to the trochanteric region at the base of the femoral neck pins. Electronically Signed   By: Jill Side M.D.   On: 04/24/2022 13:42   DG Chest 1 View  Result Date: 04/24/2022 CLINICAL DATA:  Recent fall EXAM: CHEST  1 VIEW COMPARISON:  Chest x-ray March 28, 2022 FINDINGS: The cardiomediastinal silhouette is unchanged in contour. No focal pulmonary opacity. No pleural effusion or pneumothorax. The visualized upper abdomen is unremarkable. No acute osseous abnormality. IMPRESSION: No acute cardiopulmonary abnormality. Electronically Signed   By: Beryle Flock M.D.   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

## 2022-04-27 ENCOUNTER — Encounter: Payer: Self-pay | Admitting: Orthopedic Surgery

## 2022-04-27 DIAGNOSIS — F32A Depression, unspecified: Secondary | ICD-10-CM | POA: Diagnosis not present

## 2022-04-27 DIAGNOSIS — E1059 Type 1 diabetes mellitus with other circulatory complications: Secondary | ICD-10-CM

## 2022-04-27 DIAGNOSIS — I25118 Atherosclerotic heart disease of native coronary artery with other forms of angina pectoris: Secondary | ICD-10-CM

## 2022-04-27 DIAGNOSIS — S72302K Unspecified fracture of shaft of left femur, subsequent encounter for closed fracture with nonunion: Secondary | ICD-10-CM | POA: Diagnosis not present

## 2022-04-27 LAB — URINALYSIS, COMPLETE (UACMP) WITH MICROSCOPIC
Bilirubin Urine: NEGATIVE
Glucose, UA: >=500 mg/dL — AB
Hgb urine dipstick: NEGATIVE
Ketones, ur: 80 mg/dL — AB
Leukocytes,Ua: NEGATIVE
Nitrite: NEGATIVE
Protein, ur: NEGATIVE mg/dL
Specific Gravity, Urine: 1.015 (ref 1.005–1.030)
pH: 5 (ref 5.0–8.0)

## 2022-04-27 LAB — BASIC METABOLIC PANEL
Anion gap: 7 (ref 5–15)
BUN: 26 mg/dL — ABNORMAL HIGH (ref 8–23)
CO2: 23 mmol/L (ref 22–32)
Calcium: 7.9 mg/dL — ABNORMAL LOW (ref 8.9–10.3)
Chloride: 107 mmol/L (ref 98–111)
Creatinine, Ser: 0.8 mg/dL (ref 0.44–1.00)
GFR, Estimated: >60 mL/min (ref >60)
Glucose, Bld: 121 mg/dL — ABNORMAL HIGH (ref 70–99)
Potassium: 3.4 mmol/L — ABNORMAL LOW (ref 3.5–5.1)
Sodium: 137 mmol/L (ref 135–145)

## 2022-04-27 LAB — PREPARE RBC (CROSSMATCH)

## 2022-04-27 LAB — CBC
HCT: 21 % — ABNORMAL LOW (ref 36.0–46.0)
Hemoglobin: 6.8 g/dL — ABNORMAL LOW (ref 12.0–15.0)
MCH: 31.9 pg (ref 26.0–34.0)
MCHC: 32.4 g/dL (ref 30.0–36.0)
MCV: 98.6 fL (ref 80.0–100.0)
Platelets: 189 10*3/uL (ref 150–400)
RBC: 2.13 MIL/uL — ABNORMAL LOW (ref 3.87–5.11)
RDW: 14.9 % (ref 11.5–15.5)
WBC: 9.8 10*3/uL (ref 4.0–10.5)
nRBC: 0 % (ref 0.0–0.2)

## 2022-04-27 LAB — GLUCOSE, CAPILLARY
Glucose-Capillary: 225 mg/dL — ABNORMAL HIGH (ref 70–99)
Glucose-Capillary: 121 mg/dL — ABNORMAL HIGH (ref 70–99)
Glucose-Capillary: 106 mg/dL — ABNORMAL HIGH (ref 70–99)
Glucose-Capillary: 332 mg/dL — ABNORMAL HIGH (ref 70–99)
Glucose-Capillary: 126 mg/dL — ABNORMAL HIGH (ref 70–99)
Glucose-Capillary: 275 mg/dL — ABNORMAL HIGH (ref 70–99)

## 2022-04-27 LAB — HEMOGLOBIN AND HEMATOCRIT, BLOOD
HCT: 26.6 % — ABNORMAL LOW (ref 36.0–46.0)
Hemoglobin: 8.6 g/dL — ABNORMAL LOW (ref 12.0–15.0)

## 2022-04-27 MED ORDER — ADULT MULTIVITAMIN W/MINERALS CH
1.0000 | ORAL_TABLET | Freq: Every day | ORAL | Status: DC
  Administered 2022-04-27 – 2022-05-04 (×8): 1 via ORAL
  Filled 2022-04-27 (×8): qty 1

## 2022-04-27 MED ORDER — SODIUM CHLORIDE 0.9% IV SOLUTION: Freq: Once | INTRAVENOUS | Status: AC

## 2022-04-27 MED ORDER — INSULIN ASPART 100 UNIT/ML IJ SOLN
0.0000 [IU] | Freq: Every day | INTRAMUSCULAR | Status: DC
  Administered 2022-04-27: 3 [IU] via SUBCUTANEOUS
  Filled 2022-04-27: qty 1

## 2022-04-27 MED ORDER — ASPIRIN 81 MG PO TBEC
81.0000 mg | DELAYED_RELEASE_TABLET | Freq: Every day | ORAL | Status: DC
  Administered 2022-04-28 – 2022-05-04 (×7): 81 mg via ORAL
  Filled 2022-04-27 (×7): qty 1

## 2022-04-27 MED ORDER — INSULIN ASPART 100 UNIT/ML IJ SOLN
0.0000 [IU] | Freq: Three times a day (TID) | INTRAMUSCULAR | Status: DC
  Administered 2022-04-27: 1 [IU] via SUBCUTANEOUS
  Administered 2022-04-28: 7 [IU] via SUBCUTANEOUS
  Administered 2022-04-28: 3 [IU] via SUBCUTANEOUS
  Filled 2022-04-27 (×3): qty 1

## 2022-04-27 MED ORDER — POTASSIUM CHLORIDE 20 MEQ PO PACK
40.0000 meq | PACK | Freq: Once | ORAL | Status: AC
  Administered 2022-04-27: 40 meq via ORAL
  Filled 2022-04-27: qty 2

## 2022-04-27 NOTE — Progress Notes (Signed)
PROGRESS NOTE    Diana Frost  CNO:[id number]  DOB: 11/07/1945  DOA: 04/24/2022 PCP: Latanya Maudlin, NP Outpatient Specialists:   Hospital course:  77 year old female with DM2, HTN, CAD s/p recent left femoral neck fracture s/p ORIF 03/29/2022 was readmitted yesterday with another fall again on her left hip and new left proximal femur fracture.  1/22: Hemodynamically stable, s/p IM nail removal by orthopedic surgery.  PT/OT recommending SNF. Concern of some intermittent confusion, daughter asking to check UA as she becomes confused with UTI- Hemoglobin decreased to 6.8-ordered 1 unit of PRBC   Subjective: Patient was sitting in chair comfortably when seen today.  Denies any urinary symptoms.  No chest pain or shortness of breath.  No pain while resting but to interfere with ambulation.   Objective: Vitals:   04/27/22 0215 04/27/22 0742 04/27/22 1338 04/27/22 1401  BP: 113/64 (!) 107/58 137/67 137/62  Pulse: (!) 107 (!) 104 (!) 116 (!) 116  Resp: '16 18 18 17  '$ Temp: 98.4 F (36.9 C) 98 F (36.7 C) 98.2 F (36.8 C) 98.6 F (37 C)  TempSrc: Oral  Oral Oral  SpO2: 100% 100% 99% 100%  Weight:      Height:        Intake/Output Summary (Last 24 hours) at 04/27/2022 1429 Last data filed at 04/27/2022 2947 Gross per 24 hour  Intake 2507.41 ml  Output 200 ml  Net 2307.41 ml    Filed Weights   04/24/22 1250  Weight: 49.9 kg     Exam:  General.  Frail elderly lady, in no acute distress. Pulmonary.  Lungs clear bilaterally, normal respiratory effort. CV.  Regular rate and rhythm, no JVD, rub or murmur. Abdomen.  Soft, nontender, nondistended, BS positive. CNS.  Alert and oriented .  No focal neurologic deficit. Extremities.  No edema, no cyanosis, pulses intact and symmetrical. Psychiatry.  Judgment and insight appears normal.   Data Reviewed:  Basic Metabolic Panel: Recent Labs  Lab 04/24/22 1309 04/25/22 1156 04/26/22 0456 04/27/22 0420  NA 138  139 138 137  K 4.3 4.5 3.8 3.4*  CL 105 105 105 107  CO2 24 14* 23 23  GLUCOSE 162* 408* 147* 121*  BUN 24* 33* 35* 26*  CREATININE 0.90 1.40* 1.16* 0.80  CALCIUM 8.5* 8.5* 8.3* 7.9*     CBC: Recent Labs  Lab 04/24/22 1309 04/25/22 1156 04/26/22 0456 04/27/22 0420  WBC 6.7 13.3* 11.3* 9.8  NEUTROABS 5.3  --   --   --   HGB 10.0* 9.1* 7.3* 6.8*  HCT 31.3* 29.7* 23.1* 21.0*  MCV 98.4 103.5* 99.6 98.6  PLT 246 271 186 189      Scheduled Meds:  acetaminophen  1,000 mg Oral TID   buPROPion  150 mg Oral q morning   docusate sodium  100 mg Oral BID   enoxaparin (LOVENOX) injection  40 mg Subcutaneous Q24H   ferrous sulfate  325 mg Oral Q breakfast   insulin aspart  0-15 Units Subcutaneous Q4H   midodrine  5 mg Oral TID with meals   mupirocin ointment  1 Application Nasal BID   rosuvastatin  10 mg Oral QHS   senna-docusate  2 tablet Oral BID   sertraline  100 mg Oral Daily   Vitamin D (Ergocalciferol)  50,000 Units Oral Q7 days   zinc sulfate  220 mg Oral Daily   Continuous Infusions:  methocarbamol (ROBAXIN) IV       Assessment & Plan:  Recurrent left hip fracture Patient is s/p left IM nail removal  Patient states her pain is controlled at rest, get worse with ambulation Further management per orthopedics PT/OT are recommending SNF  ABLA Hemoglobin is down to 6.8 today, down from her baseline of 9-10. Ordered 1 unit of PRBC -Continue iron supplement. Monitor hemoglobin Transfuse if below 7  Delirium Patient with some postop delirium, no evidence of infection, no recent use of IV pain management although she is on p.o. hydrocodone. Daughter is very concerned about UTI, UA was not consistent with UTI, no significant urinary symptoms. -Delirium precautions  AKI Resolved with IV fluid  DM 1 Patient was on an insulin pump at home. -SSI-switched her to with meal and nighttime coverage.  CAD No chest pain. -Restart home aspirin  Depression Continue  sertraline and bupropion   DVT prophylaxis: SCD Code Status: DNR   Studies: No results found.  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 Errors have been sought and corrected,but may not always be located. Such creation errors do not reflect on the standard of care.   Lorella Nimrod, [facility]  If 7PM-7AM, please contact night-coverage [email or web address]   LOS: 3 days

## 2022-04-27 NOTE — TOC Progression Note (Signed)
Transition of Care [facility]) - Progression Note    Patient Details  Name: Diana Frost MRN: [id number] Date of Birth: 1946/01/13  Transition of Care [facility]) CM/SW [place], RN Phone Number: 04/27/2022, 10:57 AM  Clinical Narrative:    Met with the patient, she is confused and thinks she is at home I called her daughter [name] to talk about STR placement, she stated the patient has not been confused until now. She stated she was at [facility] for about 3 weeks, she stated that she is aware of the copay days,  She gives permission for a bedsearch but only if 4 stars or higher, she prefers [facility], I explained that I would check to see if [facility] has a bed but if not they will need to be open to other options, I asked what would be the plan if we do not find a 4 star facility, she stated that they would stay in the hospital, I explained that medicare would not cover a hospital stay for that reason, She stated that they are willing to go outside [facility] pending   Expected Discharge Plan: [place] Barriers to Discharge: SNF Pending bed offer  Expected Discharge Plan and Services   Discharge Planning Services: CM Consult   Living arrangements for the past 2 months: [place] (has been at [facility])                 DME Arranged: N/A                     Social Determinants of Health (SDOH) Interventions SDOH Screenings   Food Insecurity: No Food Insecurity (04/24/2022)  Housing: Low Risk  (04/24/2022)  Transportation Needs: No Transportation Needs (04/24/2022)  Utilities: Not At Risk (04/24/2022)  Tobacco Use: Medium Risk (04/24/2022)    Readmission Risk Interventions     No data to display

## 2022-04-27 NOTE — Progress Notes (Signed)
Nutrition Follow-up  DOCUMENTATION CODES:   Not applicable  INTERVENTION:   -Magic cup BID with meals, each supplement provides 290 kcal and 9 grams of protein  -MVI with minerals daily -Liberalize diet to regular for widest variety of meal selections  NUTRITION DIAGNOSIS:   Increased nutrient needs related to post-op healing as evidenced by estimated needs.  Ongoing  GOAL:   Patient will meet greater than or equal to 90% of their needs  Progressing   MONITOR:   PO intake  REASON FOR ASSESSMENT:   Consult Hip fracture protocol  ASSESSMENT:   Pt with hx of DM type 1, CAD with hx of MI, and HTN presented to ED with pain after a fall at home. Pt was discharged from CIR the day prior after receiving rehab from a fall and left femur fracture. Imaging in ED showed a new proximal femur fracture just distal to the operative site  1/20- s/p rt IMN  Reviewed I/O's: +1.7 L x 24 hours and +1.9 L since admission  UOP: 400 ml x 24 hours  Spoke with pt and daughter at bedside. Pt is confused, but easily redirected. Pt daughter provides most of the history. Pt was recent discharged from CIR, where pt did quite well. Pt with good appetite and consuming 100% of meals, however, daughter reports concern of intermittent vomiting with meals, which has been happening over the past 1-2 months. Timing is often erratic, but often occurs in the morning after pt has taken her medications. Per daughter, pt has never been worked up for this.   Reviewed wt hx; pt has experienced a 7.2% wt loss over the past month. Per daughter, pt's UBW is around 130# and suspects pt has lost about 10-15# over the past month. Weight obtained on bedscale at 123#, however, however unsure how accurate wt is.   Case discussed with MD; reported above concerns with vomiting, recommending possible GI consult or motility agent. Per MD, pt to follow-up with GI as an outpatient.   Medications reviewed and include colace,  ferrous sulfate, senokot, vitamin D, and zinc sulfate.   Labs reviewed: CBGS: 106-332 (inpatient orders for glycemic control are 0-5 units insulin aspart daily at bedtime, 0-9 units insulin aspart TID with meals). Per daughter, pt with good glucose control PTA and uses the Omnipod- she has seen great improvement in glycemic control since using.   NUTRITION - FOCUSED PHYSICAL EXAM:  Flowsheet Row Most Recent Value  Orbital Region No depletion  Upper Arm Region No depletion  Thoracic and Lumbar Region No depletion  Buccal Region No depletion  Temple Region No depletion  Clavicle Bone Region No depletion  Clavicle and Acromion Bone Region No depletion  Scapular Bone Region No depletion  Dorsal Hand No depletion  Patellar Region No depletion  Anterior Thigh Region No depletion  Posterior Calf Region No depletion  Edema (RD Assessment) None  Hair Reviewed  Eyes Reviewed  Mouth Reviewed  Skin Reviewed  Nails Reviewed       Diet Order:   Diet Order             Diet Carb Modified Fluid consistency: Thin; Room service appropriate? Yes  Diet effective now                   EDUCATION NEEDS:   Education needs have been addressed  Skin:  Skin Assessment: Skin Integrity Issues: Skin Integrity Issues:: Incisions Incisions: closed lt hip  Last BM:  04/23/22  Height:  Ht Readings from Last 1 Encounters:  04/24/22 '5\' 2"'$  (1.575 m)    Weight:   Wt Readings from Last 1 Encounters:  04/24/22 49.9 kg    Ideal Body Weight:  50 kg  BMI:  Body mass index is 20.12 kg/m.  Estimated Nutritional Needs:   Kcal:  1500-1700 kcal/d  Protein:  70-90g/d  Fluid:  >/=1.5L/d    Loistine Chance, RD, LDN, [place] Registered Dietitian II Certified Diabetes Care and Education Specialist Please refer to [facility] for RD and/or RD on-call/weekend/after hours pager

## 2022-04-27 NOTE — Progress Notes (Signed)
Hbg was 6.8 this AM. MD ordered blood. 1 unit of blood was given, no allergic reaction was noted. Post H&H ordered.

## 2022-04-27 NOTE — Care Management Important Message (Signed)
Important Message  Patient Details  Name: Diana Frost MRN: [id number] Date of Birth: 12-04-1945   Medicare Important Message Given:  N/A - LOS <3 / Initial given by admissions     [name] A [facility] [place] 04/27/2022, 10:29 AM

## 2022-04-27 NOTE — TOC Progression Note (Addendum)
Transition of Care [facility]) - Progression Note    Patient Details  Name: Diana Frost MRN: [id number] Date of Birth: [birth year]  Transition of Care [facility]) CM/SW [place], RN Phone Number: 04/27/2022, 11:29 AM  Clinical Narrative:   Daughter Izora Gala called and stated that she would like me to fax the SNF referral to [facility] in [place] to [phone] Sent fax Did bedsearch to all 4 star facilities in [place], waiting to hear    Expected Discharge Plan: [place] Barriers to Discharge: SNF Pending bed offer  Expected Discharge Plan and Services   Discharge Planning Services: CM Consult   Living arrangements for the past 2 months: [place] (has been at [facility])                 DME Arranged: N/A                     Social Determinants of Health (SDOH) Interventions SDOH Screenings   Food Insecurity: No Food Insecurity (04/24/2022)  Housing: Low Risk  (04/24/2022)  Transportation Needs: No Transportation Needs (04/24/2022)  Utilities: Not At Risk (04/24/2022)  Tobacco Use: Medium Risk (04/27/2022)    Readmission Risk Interventions     No data to display

## 2022-04-27 NOTE — Progress Notes (Signed)
Physical Therapy Treatment Patient Details Name: Diana Frost MRN: [id number] DOB: 1945/11/14 Today's Date: 04/27/2022   History of Present Illness Pt is a 76 y/o F admitted on 04/24/22 following a fall. Pt with recent L femoral neck fx & s/p ORIF 03/29/22. Pt now with L proximal femur fx. Pt underwent L IM placement & hardware removal by Dr. Harlow Mares on 04/25/22. PMH: DM1 on insulin pump, CAD, HTN    PT Comments    Pt with low Hgb and symptomatic up in chair. Currently awaiting transfusion. Therefore, session modified to Seated ROM and strengthening exercises as tolerated. Pt remains confused and oriented to self and place.  Will continue to progress as able. Continue to recommend SNF once medically cleared.   Recommendations for follow up therapy are one component of a multi-disciplinary discharge planning process, led by the attending physician.  Recommendations may be updated based on patient status, additional functional criteria and insurance authorization.  Follow Up Recommendations  Skilled nursing-short term rehab (<3 hours/day) Can patient physically be transported by private vehicle: No   Assistance Recommended at Discharge Frequent or constant Supervision/Assistance  Patient can return home with the following Direct supervision/assist for medications management;Help with stairs or ramp for entrance;A lot of help with walking and/or transfers;A lot of help with bathing/dressing/bathroom;Assist for transportation;Assistance with cooking/housework;Direct supervision/assist for financial management   Equipment Recommendations  None recommended by PT    Recommendations for Other Services OT consult     Precautions / Restrictions Precautions Precautions: Fall Restrictions Weight Bearing Restrictions: Yes LLE Weight Bearing: Partial weight bearing LLE Partial Weight Bearing Percentage or Pounds: 50% Other Position/Activity Restrictions:  (Pt needs reminder)     Mobility   Bed Mobility                    Transfers                        Ambulation/Gait                   Stairs             Wheelchair Mobility    Modified Rankin (Stroke Patients Only)       Balance                                            Cognition Arousal/Alertness: Awake/alert Behavior During Therapy: WFL for tasks assessed/performed Overall Cognitive Status: No family/caregiver present to determine baseline cognitive functioning                                 General Comments: Pt is AxOx3 (disoriented to location). Pt follows simple commands with extra time throughout session. Demonstrates decreased safety awareness.        Exercises General Exercises - Lower Extremity Ankle Circles/Pumps: AROM, Both, 10 reps Long Arc Quad: AAROM, Left, AROM, Right, 10 reps Hip Flexion/Marching: AAROM, Both, 10 reps    General Comments General comments (skin integrity, edema, etc.): Pt very confused and requires constant redirection      Pertinent Vitals/Pain Pain Assessment Pain Assessment: Faces Faces Pain Scale: Hurts little more    Home Living  Prior Function            PT Goals (current goals can now be found in the care plan section)      Frequency    BID      PT Plan Current plan remains appropriate    Co-evaluation              AM-PAC PT "6 Clicks" Mobility   Outcome Measure  Help needed turning from your back to your side while in a flat bed without using bedrails?: A Lot Help needed moving from lying on your back to sitting on the side of a flat bed without using bedrails?: Total Help needed moving to and from a bed to a chair (including a wheelchair)?: Total Help needed standing up from a chair using your arms (e.g., wheelchair or bedside chair)?: Total Help needed to walk in hospital room?: Total Help needed climbing 3-5 steps with a  railing? : Total 6 Click Score: 7    End of Session   Activity Tolerance: Treatment limited secondary to medical complications (Comment) (c/o dizziness and nausea) Patient left: in chair;with call bell/phone within reach;with chair alarm set;with nursing/sitter in room Nurse Communication: Mobility status (pt c/o dizziness) PT Visit Diagnosis: Pain;Difficulty in walking, not elsewhere classified (R26.2);Other abnormalities of gait and mobility (R26.89);Muscle weakness (generalized) (M62.81);History of falling (Z91.81);Unsteadiness on feet (R26.81) Pain - Right/Left: Left Pain - part of body: Hip     Time: 1130-1140 PT Time Calculation (min) (ACUTE ONLY): 10 min  Charges:  $Therapeutic Exercise: 8-22 mins                    Mikel Cella, PTA    Josie Dixon 04/27/2022, 3:21 PM

## 2022-04-27 NOTE — Inpatient Diabetes Management (Addendum)
Inpatient Diabetes Program Recommendations  AACE/ADA: New Consensus Statement on Inpatient Glycemic Control (2015)  Target Ranges:  Prepandial:   less than 140 mg/dL      Peak postprandial:   less than 180 mg/dL (1-2 hours)      Critically ill patients:  140 - 180 mg/dL   Lab Results  Component Value Date   GLUCAP 106 (H) 04/27/2022   HGBA1C 7.2 (H) 04/01/2022    Review of Glycemic Control  Latest Reference Range & Units 04/26/22 07:34 04/26/22 11:40 04/26/22 16:44 04/26/22 20:04 04/27/22 01:16 04/27/22 04:17 04/27/22 07:58 04/27/22 11:36  Glucose-Capillary 70 - 99 mg/dL 308 (H) 172 (H) 267 (H) 186 (H) 225 (H) 121 (H) 106 (H) 332 (H)  (H): Data is abnormally high  Diabetes history: DM1(does not make insulin.  Needs correction, basal and meal coverage)   Outpatient Diabetes medications:  Omnipod insulin pump with the Dexcom G6 CGM Dr. Honor Junes is her endocrinologist  Basal rates 12 AM = 0.3 9 AM = 0.5 12 PM = 0.45 6 PM = 0.4 9 PM = 0.2 TDD basal: 8.7 units  Bolus settings I/C: 13. ISF: 60 Target Glucose: 120 Active insulin time: 5 hours  Current orders for Inpatient glycemic control:  Novolog 0-15 units Q4H Decadron 5 mg on 04/25/21  Inpatient Diabetes Program Recommendations:    Patient is well known to the diabetes team.  She is very sensitive to insulin (1 units brings her down 60 mg/dL).   Recent discharge from CIR.  Uses an insulin pump at home.    She has a diet order.  Please consider:  Novolog 0-6 units TID and 0-5 units QHS Novolog 2 units TID with meals  Semglee 5 units QD (0.1 units x 49.9 kg)  Will continue to follow while inpatient.  Thank you, [name], MSN, [place] Diabetes Coordinator Inpatient Diabetes Program [phone] (team pager from 8a-5p)

## 2022-04-28 DIAGNOSIS — F32A Depression, unspecified: Secondary | ICD-10-CM | POA: Diagnosis not present

## 2022-04-28 DIAGNOSIS — I25118 Atherosclerotic heart disease of native coronary artery with other forms of angina pectoris: Secondary | ICD-10-CM | POA: Diagnosis not present

## 2022-04-28 DIAGNOSIS — S72302K Unspecified fracture of shaft of left femur, subsequent encounter for closed fracture with nonunion: Secondary | ICD-10-CM | POA: Diagnosis not present

## 2022-04-28 DIAGNOSIS — E1010 Type 1 diabetes mellitus with ketoacidosis without coma: Secondary | ICD-10-CM

## 2022-04-28 DIAGNOSIS — E1059 Type 1 diabetes mellitus with other circulatory complications: Secondary | ICD-10-CM | POA: Diagnosis not present

## 2022-04-28 LAB — TYPE AND SCREEN
ABO/RH(D): O POS
Antibody Screen: NEGATIVE
Unit division: 0

## 2022-04-28 LAB — CBC
HCT: 30.5 % — ABNORMAL LOW (ref 36.0–46.0)
Hemoglobin: 9.9 g/dL — ABNORMAL LOW (ref 12.0–15.0)
MCH: 30.7 pg (ref 26.0–34.0)
MCHC: 32.5 g/dL (ref 30.0–36.0)
MCV: 94.4 fL (ref 80.0–100.0)
Platelets: 226 10*3/uL (ref 150–400)
RBC: 3.23 MIL/uL — ABNORMAL LOW (ref 3.87–5.11)
RDW: 16.2 % — ABNORMAL HIGH (ref 11.5–15.5)
WBC: 9.9 10*3/uL (ref 4.0–10.5)
nRBC: 0 % (ref 0.0–0.2)

## 2022-04-28 LAB — BASIC METABOLIC PANEL
Anion gap: 16 — ABNORMAL HIGH (ref 5–15)
Anion gap: 9 (ref 5–15)
Anion gap: 9 (ref 5–15)
Anion gap: 7 (ref 5–15)
BUN: 17 mg/dL (ref 8–23)
BUN: 17 mg/dL (ref 8–23)
BUN: 16 mg/dL (ref 8–23)
BUN: 14 mg/dL (ref 8–23)
CO2: 13 mmol/L — ABNORMAL LOW (ref 22–32)
CO2: 20 mmol/L — ABNORMAL LOW (ref 22–32)
CO2: 22 mmol/L (ref 22–32)
CO2: 21 mmol/L — ABNORMAL LOW (ref 22–32)
Calcium: 7.9 mg/dL — ABNORMAL LOW (ref 8.9–10.3)
Calcium: 7.9 mg/dL — ABNORMAL LOW (ref 8.9–10.3)
Calcium: 8 mg/dL — ABNORMAL LOW (ref 8.9–10.3)
Calcium: 7.7 mg/dL — ABNORMAL LOW (ref 8.9–10.3)
Chloride: 107 mmol/L (ref 98–111)
Chloride: 107 mmol/L (ref 98–111)
Chloride: 106 mmol/L (ref 98–111)
Chloride: 107 mmol/L (ref 98–111)
Creatinine, Ser: 0.9 mg/dL (ref 0.44–1.00)
Creatinine, Ser: 0.83 mg/dL (ref 0.44–1.00)
Creatinine, Ser: 0.65 mg/dL (ref 0.44–1.00)
Creatinine, Ser: 0.72 mg/dL (ref 0.44–1.00)
GFR, Estimated: >60 mL/min (ref >60)
GFR, Estimated: >60 mL/min (ref >60)
GFR, Estimated: >60 mL/min (ref >60)
GFR, Estimated: >60 mL/min (ref >60)
Glucose, Bld: 298 mg/dL — ABNORMAL HIGH (ref 70–99)
Glucose, Bld: 190 mg/dL — ABNORMAL HIGH (ref 70–99)
Glucose, Bld: 76 mg/dL (ref 70–99)
Glucose, Bld: 229 mg/dL — ABNORMAL HIGH (ref 70–99)
Potassium: 3.2 mmol/L — ABNORMAL LOW (ref 3.5–5.1)
Potassium: 3.4 mmol/L — ABNORMAL LOW (ref 3.5–5.1)
Potassium: 3.8 mmol/L (ref 3.5–5.1)
Potassium: 4.1 mmol/L (ref 3.5–5.1)
Sodium: 136 mmol/L (ref 135–145)
Sodium: 136 mmol/L (ref 135–145)
Sodium: 137 mmol/L (ref 135–145)
Sodium: 135 mmol/L (ref 135–145)

## 2022-04-28 LAB — BPAM RBC
Blood Product Expiration Date: 202402272359
ISSUE DATE / TIME: 202401221331
Unit Type and Rh: 5100

## 2022-04-28 LAB — GLUCOSE, CAPILLARY
Glucose-Capillary: 341 mg/dL — ABNORMAL HIGH (ref 70–99)
Glucose-Capillary: 201 mg/dL — ABNORMAL HIGH (ref 70–99)
Glucose-Capillary: 76 mg/dL (ref 70–99)
Glucose-Capillary: 140 mg/dL — ABNORMAL HIGH (ref 70–99)
Glucose-Capillary: 203 mg/dL — ABNORMAL HIGH (ref 70–99)

## 2022-04-28 LAB — BETA-HYDROXYBUTYRIC ACID
Beta-Hydroxybutyric Acid: 1.67 mmol/L — ABNORMAL HIGH (ref 0.05–0.27)
Beta-Hydroxybutyric Acid: 1.42 mmol/L — ABNORMAL HIGH (ref 0.05–0.27)

## 2022-04-28 MED ORDER — INSULIN ASPART 100 UNIT/ML IJ SOLN: 3.0000 [IU] | Freq: Three times a day (TID) | INTRAMUSCULAR | Status: DC

## 2022-04-28 MED ORDER — INSULIN ASPART 100 UNIT/ML IJ SOLN
0.0000 [IU] | Freq: Three times a day (TID) | INTRAMUSCULAR | Status: DC
  Administered 2022-04-29 (×2): 1 [IU] via SUBCUTANEOUS
  Administered 2022-04-30: 5 [IU] via SUBCUTANEOUS
  Administered 2022-04-30 (×2): 2 [IU] via SUBCUTANEOUS
  Administered 2022-05-01: 3 [IU] via SUBCUTANEOUS
  Administered 2022-05-01: 1 [IU] via SUBCUTANEOUS
  Administered 2022-05-01: 7 [IU] via SUBCUTANEOUS
  Administered 2022-05-02: 3 [IU] via SUBCUTANEOUS
  Administered 2022-05-02: 5 [IU] via SUBCUTANEOUS
  Administered 2022-05-02: 2 [IU] via SUBCUTANEOUS
  Administered 2022-05-03: 1 [IU] via SUBCUTANEOUS
  Administered 2022-05-03: 5 [IU] via SUBCUTANEOUS
  Administered 2022-05-04: 1 [IU] via SUBCUTANEOUS
  Administered 2022-05-04: 3 [IU] via SUBCUTANEOUS
  Filled 2022-04-28 (×15): qty 1

## 2022-04-28 MED ORDER — LACTATED RINGERS IV BOLUS
20.0000 mL/kg | Freq: Once | INTRAVENOUS | Status: AC
  Administered 2022-04-28: 1000 mL via INTRAVENOUS

## 2022-04-28 MED ORDER — LACTATED RINGERS IV SOLN: INTRAVENOUS | Status: DC

## 2022-04-28 MED ORDER — INSULIN ASPART 100 UNIT/ML IJ SOLN
0.0000 [IU] | Freq: Every day | INTRAMUSCULAR | Status: DC
  Administered 2022-04-28 – 2022-04-30 (×2): 2 [IU] via SUBCUTANEOUS
  Administered 2022-05-01: 3 [IU] via SUBCUTANEOUS
  Filled 2022-04-28 (×3): qty 1

## 2022-04-28 MED ORDER — INSULIN GLARGINE-YFGN 100 UNIT/ML ~~LOC~~ SOLN
5.0000 [IU] | Freq: Two times a day (BID) | SUBCUTANEOUS | Status: DC
  Administered 2022-04-28: 5 [IU] via SUBCUTANEOUS
  Filled 2022-04-28: qty 0.05

## 2022-04-28 MED ORDER — INSULIN GLARGINE-YFGN 100 UNIT/ML ~~LOC~~ SOLN
5.0000 [IU] | Freq: Every day | SUBCUTANEOUS | Status: DC
  Administered 2022-04-29 – 2022-05-02 (×4): 5 [IU] via SUBCUTANEOUS
  Filled 2022-04-28 (×5): qty 0.05

## 2022-04-28 MED ORDER — DEXTROSE IN LACTATED RINGERS 5 % IV SOLN: INTRAVENOUS | Status: DC

## 2022-04-28 MED ORDER — LACTATED RINGERS IV SOLN: INTRAVENOUS | Status: AC

## 2022-04-28 MED ORDER — INSULIN REGULAR(HUMAN) IN NACL 100-0.9 UT/100ML-% IV SOLN
INTRAVENOUS | Status: DC
  Filled 2022-04-28: qty 100

## 2022-04-28 MED ORDER — METOPROLOL TARTRATE 25 MG PO TABS
12.5000 mg | ORAL_TABLET | Freq: Two times a day (BID) | ORAL | Status: DC
  Administered 2022-04-28 – 2022-05-04 (×11): 12.5 mg via ORAL
  Filled 2022-04-28 (×14): qty 1

## 2022-04-28 MED ORDER — DEXTROSE 50 % IV SOLN: 0.0000 mL | INTRAVENOUS | Status: DC | PRN

## 2022-04-28 MED ORDER — INSULIN GLARGINE-YFGN 100 UNIT/ML ~~LOC~~ SOLN
5.0000 [IU] | Freq: Two times a day (BID) | SUBCUTANEOUS | Status: DC
  Filled 2022-04-28: qty 0.05

## 2022-04-28 MED ORDER — POTASSIUM CHLORIDE 10 MEQ/100ML IV SOLN
10.0000 meq | INTRAVENOUS | Status: AC
  Administered 2022-04-28 (×4): 10 meq via INTRAVENOUS
  Filled 2022-04-28: qty 200
  Filled 2022-04-28 (×2): qty 100

## 2022-04-28 NOTE — TOC Progression Note (Signed)
Transition of Care [facility]) - Progression Note    Patient Details  Name: Diana Frost MRN: [id number] Date of Birth: 06-12-1945  Transition of Care [facility]) CM/SW [place], RN Phone Number: 04/28/2022, 11:07 AM  Clinical Narrative:   received a call from Loiza at [facility] She requested PT notes, I faxed the PT notes to [phone]    Expected Discharge Plan: [place] Barriers to Discharge: SNF Pending bed offer  Expected Discharge Plan and Services   Discharge Planning Services: CM Consult   Living arrangements for the past 2 months: [place] (has been at [facility])                 DME Arranged: N/A                     Social Determinants of Health ([place]) Interventions SDOH Screenings   Food Insecurity: No Food Insecurity (04/24/2022)  Housing: Low Risk  (04/24/2022)  Transportation Needs: No Transportation Needs (04/24/2022)  Utilities: Not At Risk (04/24/2022)  Tobacco Use: Medium Risk (04/27/2022)    Readmission Risk Interventions    04/28/2022   10:10 AM  Readmission Risk Prevention Plan  Transportation Screening Complete  Medication Review (RN Care Manager) Referral to Pharmacy  PCP or Specialist appointment within 3-5 days of discharge Complete  HRI or Home Care Consult Complete  SW Recovery Care/Counseling Consult Complete  [place] Complete

## 2022-04-28 NOTE — TOC Progression Note (Signed)
Transition of Care [facility]) - Progression Note    Patient Details  Name: Diana Frost MRN: [id number] Date of Birth: July 06, 1945  Transition of Care [facility]) CM/SW [place], RN Phone Number: 04/28/2022, 9:53 AM  Clinical Narrative:     Eden Lathe the daughter and explained that [facility] was not able to make a bed offer at this time, she asked if I had heard from [facility], I explained that I sent the fax to them with the referral and that I have a confirmation that it went thru but I have not heard from them,, I let her know we do have a bed offer from [facility] in [place] and provided the [employer], she stated that they would wait to hear back from [facility], I explained mer Medicare we can not hold a patient waiting on a specific bed it often means that they will not offer a bed if they do not call, I let her know that the patient is Medically ready to DC and that we have a bed offer, She stated that she would call [facility], I encouraged her to review the facility [facility] and she stated she would  Expected Discharge Plan: [place] Barriers to Discharge: SNF Pending bed offer  Expected Discharge Plan and Services   Discharge Planning Services: CM Consult   Living arrangements for the past 2 months: [place] (has been at [facility])                 DME Arranged: N/A                     Social Determinants of Health (SDOH) Interventions SDOH Screenings   Food Insecurity: No Food Insecurity (04/24/2022)  Housing: Low Risk  (04/24/2022)  Transportation Needs: No Transportation Needs (04/24/2022)  Utilities: Not At Risk (04/24/2022)  Tobacco Use: Medium Risk (04/27/2022)    Readmission Risk Interventions     No data to display

## 2022-04-28 NOTE — TOC Progression Note (Addendum)
Transition of Care [facility]) - Progression Note    Patient Details  Name: Diana Frost MRN: [id number] Date of Birth: 1945-06-13  Transition of Care [facility]) CM/SW Contact  Ross Ludwig, [place] Phone Number: 04/28/2022, 10:50 AM  Clinical Narrative:     10:45am [facility]ed [facility] and spoke to the admissions worker Benay Pillow, to ask if they have had a chance to review referral.  Per Johnette Abraham they are still reviewing, and will contact this Probation officer as soon as she has an answer.  This writer attempted to contact patient's daughter Leverne Humbles [phone] had to leave a message waiting for a call back.  11:45am This Probation officer spoke to patient's daughter and explained to her that [facility] has not made a decision on if they can accept patient or not.  This Probation officer also expressed to her that if [facility] says no per Medicare guidelines patient has to go to the first available facility that has accepted patient.  This Probation officer also informed her that Medicare EMS transport is only covered by up to 50 miles and if patient goes out of county it will be challenging to find an EMS that can transfer patient.  Per daughter she said she would transport patient herself in her vehicle if needed.  Daughter requested only 4 star facilities per [facility] note and what daughter is requesting.  Daughter asked for [facility] to be contacted, this Probation officer told her that they are not a 80 star facility, daughter said that is okay she only wants the best care for her mother.  This Probation officer expressed understanding and send referral to [facility].  Referral also made to other Medicare 4 star ratings facilities.  Patient does have a bed offer from [facility] and daughter was made aware of this.  TOC team to continue to follow patient's progress throughout discharge planning.   Expected Discharge Plan: Skilled Nursing Facility Barriers to Discharge: SNF Pending bed offer  Expected Discharge Plan and Services   Discharge  Planning Services: CM Consult   Living arrangements for the past 2 months: [place] (has been at [facility])                 DME Arranged: N/A                     Social Determinants of Health (SDOH) Interventions SDOH Screenings   Food Insecurity: No Food Insecurity (04/24/2022)  Housing: Low Risk  (04/24/2022)  Transportation Needs: No Transportation Needs (04/24/2022)  Utilities: Not At Risk (04/24/2022)  Tobacco Use: Medium Risk (04/27/2022)    Readmission Risk Interventions    04/28/2022   10:10 AM  Readmission Risk Prevention Plan  Transportation Screening Complete  Medication Review (RN Care Manager) Referral to Pharmacy  PCP or Specialist appointment within 3-5 days of discharge Complete  HRI or Home Care Consult Complete  SW Recovery Care/Counseling Consult Complete  Palliative Care Screening Not [place] Complete

## 2022-04-28 NOTE — TOC Progression Note (Signed)
Transition of Care [facility]) - Progression Note    Patient Details  Name: Diana Frost MRN: [id number] Date of Birth: 1945/05/02  Transition of Care [facility]) CM/SW [place], RN Phone Number: 04/28/2022, 10:33 AM  Clinical Narrative:   The patient's daughter [name]  called me and stated that she was having problems finding [facility] on [email or web address], I met her in the patient's room and pulled up Medicare.gov on the computer and showed her the star rating and she reviewed, she stated that she was not sending her mom there because they have staff turn over, I explained that she will find that in most facilities throughout [facility], I explained she wanted a 4 star facility and this is the only 4 star facility offering a bed and that the patient is medically Stable, per Medicare that patient is not able to stay in the hospital awaiting a specific facility to offer a bed once they have a bed offer, She asked me if I have talked to [facility], I explained that they have not called me, She stated that they have called her.  I asked if they offered a bed, she said they are looking, I explained that they would have to let me know if they are going to offer a bed.  She then asked me what my problem is I explained I was letting her know the process that I do not have problem, she asked me for my name and looked at my name tag and said she would not talk to me anymore that she wanted my Supervisor I notified my Supervisor    Expected Discharge Plan: [place] Barriers to Discharge: SNF Pending bed offer  Expected Discharge Plan and Services   Discharge Planning Services: CM Consult   Living arrangements for the past 2 months: [place] (has been at [facility])                 DME Arranged: N/A                     Social Determinants of Health (SDOH) Interventions SDOH Screenings   Food Insecurity: No Food Insecurity (04/24/2022)  Housing: Low  Risk  (04/24/2022)  Transportation Needs: No Transportation Needs (04/24/2022)  Utilities: Not At Risk (04/24/2022)  Tobacco Use: Medium Risk (04/27/2022)    Readmission Risk Interventions    04/28/2022   10:10 AM  Readmission Risk Prevention Plan  Transportation Screening Complete  Medication Review (RN Care Manager) Referral to Pharmacy  PCP or Specialist appointment within 3-5 days of discharge Complete  HRI or Home Care Consult Complete  SW Recovery Care/Counseling Consult Complete  Palliative Care Screening Not [place] Complete

## 2022-04-28 NOTE — Progress Notes (Signed)
Physical Therapy Treatment Patient Details Name: Diana Frost MRN: [id number] DOB: [birth year] Today's Date: 04/28/2022   History of Present Illness Pt is a 77 y/o F admitted on 04/24/22 following a fall. Pt with recent L femoral neck fx & s/p ORIF 03/29/22. Pt now with L proximal femur fx. Pt underwent L IM placement & hardware removal by Dr. Harlow Mares on 04/25/22. PMH: DM1 on insulin pump, CAD, HTN    PT Comments    Pt received in bed, mobility deferred due to awaiting transfer to ICU for insulin drip. Discussed POC with Nursing and MD, will continue to follow for acute PT needs. Today's session focused on L LE strengthening and ROM, education provided to daughter on performing with pt. With good understanding. Continue to recommend to SNF once medically stable. Will hold PT session this pm and continue 1/24.   Recommendations for follow up therapy are one component of a multi-disciplinary discharge planning process, led by the attending physician.  Recommendations may be updated based on patient status, additional functional criteria and insurance authorization.  Follow Up Recommendations  Skilled nursing-short term rehab (<3 hours/day) Can patient physically be transported by private vehicle: No   Assistance Recommended at Discharge Frequent or constant Supervision/Assistance  Patient can return home with the following Direct supervision/assist for medications management;Help with stairs or ramp for entrance;A lot of help with walking and/or transfers;A lot of help with bathing/dressing/bathroom;Assist for transportation;Assistance with cooking/housework;Direct supervision/assist for financial management   Equipment Recommendations  None recommended by PT    Recommendations for Other Services OT consult     Precautions / Restrictions Precautions Precautions: Fall Restrictions Weight Bearing Restrictions: Yes LLE Weight Bearing: Partial weight bearing LLE Partial Weight Bearing  Percentage or Pounds: 50     Mobility  Bed Mobility               General bed mobility comments:  (deferred)    Transfers                   General transfer comment: deferred    Ambulation/Gait               General Gait Details: deferred   Stairs             Wheelchair Mobility    Modified Rankin (Stroke Patients Only)       Balance                                            Cognition Arousal/Alertness: Awake/alert Behavior During Therapy: WFL for tasks assessed/performed Overall Cognitive Status: Impaired/Different from baseline Area of Impairment: Orientation, Memory, Safety/judgement, Awareness, Problem solving                 Orientation Level: Situation, Time   Memory: Decreased recall of precautions, Decreased short-term memory   Safety/Judgement: Decreased awareness of safety, Decreased awareness of deficits Awareness: Intellectual Problem Solving: Slow processing, Requires verbal cues, Requires tactile cues General Comments: Pt's daughter states she did not have cognitive impairments prior to her surgery        Exercises General Exercises - Lower Extremity Ankle Circles/Pumps: AROM, Both, 10 reps, Supine Heel Slides: AAROM, Left, 10 reps Hip ABduction/ADduction: AAROM, Left, 10 reps Other Exercises Other Exercises: Left hip IR/ER x 10    General Comments General comments (skin integrity, edema, etc.):  (Pt and pt's daughter  educated on current POC and set goals with anticipated transition to SNF once medically stable. Educated on Supine ROM exercises for daughter to assist with as well as safe car transfers at d/c.)      Pertinent Vitals/Pain Pain Assessment Pain Assessment: Faces Faces Pain Scale: Hurts little more Pain Location: LLE Pain Descriptors / Indicators: Discomfort, Grimacing Pain Intervention(s): Monitored during session, Repositioned    Home Living                           Prior Function            PT Goals (current goals can now be found in the care plan section) Acute Rehab PT Goals Patient Stated Goal: decreased pain    Frequency    BID      PT Plan Current plan remains appropriate    Co-evaluation              AM-PAC PT "6 Clicks" Mobility   Outcome Measure  Help needed turning from your back to your side while in a flat bed without using bedrails?: A Lot Help needed moving from lying on your back to sitting on the side of a flat bed without using bedrails?: A Lot Help needed moving to and from a bed to a chair (including a wheelchair)?: A Lot Help needed standing up from a chair using your arms (e.g., wheelchair or bedside chair)?: A Lot Help needed to walk in hospital room?: A Lot Help needed climbing 3-5 steps with a railing? : Total 6 Click Score: 11    End of Session   Activity Tolerance: Treatment limited secondary to medical complications (Comment) (Pt awaiting transfer to step down for insulin drip) Patient left: in bed;with call bell/phone within reach;with nursing/sitter in room;with family/visitor present;with bed alarm set   PT Visit Diagnosis: Pain;Difficulty in walking, not elsewhere classified (R26.2);Other abnormalities of gait and mobility (R26.89);Muscle weakness (generalized) (M62.81);History of falling (Z91.81);Unsteadiness on feet (R26.81) Pain - Right/Left: Left Pain - part of body: Hip     Time: 7096-2836 PT Time Calculation (min) (ACUTE ONLY): 25 min  Charges:  $Therapeutic Exercise: 8-22 mins $Therapeutic Activity: 8-22 mins                    Mikel Cella, PTA    Josie Dixon 04/28/2022, 1:49 PM

## 2022-04-28 NOTE — Progress Notes (Signed)
PROGRESS NOTE    Diana Frost  XIP:[id number]  DOB: 03-17-1946  DOA: 04/24/2022 PCP: Latanya Maudlin, NP Outpatient Specialists:   Hospital course:  77 year old female with DM2, HTN, CAD s/p recent left femoral neck fracture s/p ORIF 03/29/2022 was readmitted yesterday with another fall again on her left hip and new left proximal femur fracture.  1/22: Hemodynamically stable, s/p IM nail removal by orthopedic surgery.  PT/OT recommending SNF. Concern of some intermittent confusion, daughter asking to check UA as she becomes confused with UTI- Hemoglobin decreased to 6.8-ordered 1 unit of PRBC  1/23: Patient was feeling little more lethargic.  Vital stable.  Labs with improving hemoglobin to 9.9 but did show anion gap metabolic acidosis consistent with DKA with elevated blood glucose level.  Endo tool per DKA protocol ordered but unable to transfer patient to stepdown due to no bed availability.  Anion gap resolved with insulin and IV fluid. Transfer was canceled and patient was restarted on basal and short-acting.  Also giving some extra fluid. Daughter was concerned about restarting insulin pump, stating that is the only way to keep her blood glucose level well-controlled.  Message sent to diabetes coordinator and pharmacy to help. Also starting her on low-dose metoprolol for tachycardia   Subjective: Patient was feeling more lethargic with mild nausea when seen today.   Objective: Vitals:   04/28/22 0751 04/28/22 0843 04/28/22 1016 04/28/22 1141  BP: (!) 149/82 (!) 145/82 108/62 (!) 110/58  Pulse: (!) 113 (!) 111 97 94  Resp: '18 18 19 20  '$ Temp: 98.8 F (37.1 C)  98.7 F (37.1 C)   TempSrc:   Oral   SpO2: 98% 99% 99% 98%  Weight:      Height:        Intake/Output Summary (Last 24 hours) at 04/28/2022 1518 Last data filed at 04/28/2022 0900 Gross per 24 hour  Intake 620.83 ml  Output --  Net 620.83 ml    Filed Weights   04/24/22 1250  Weight: 49.9 kg      Exam:  General.  Frail elderly lady, in no acute distress. Pulmonary.  Lungs clear bilaterally, normal respiratory effort. CV.  Sinus tachycardia. Abdomen.  Soft, nontender, nondistended, BS positive. CNS.  Alert and oriented .  No focal neurologic deficit. Extremities.  No edema, no cyanosis, pulses intact and symmetrical. Psychiatry.  Judgment and insight appears normal.   Data Reviewed:  Basic Metabolic Panel: Recent Labs  Lab 04/25/22 1156 04/26/22 0456 04/27/22 0420 04/28/22 0936 04/28/22 1246  NA 139 138 137 136 136  K 4.5 3.8 3.4* 3.2* 3.4*  CL 105 105 107 107 107  CO2 14* 23 23 13* 20*  GLUCOSE 408* 147* 121* 298* 190*  BUN 33* 35* 26* 17 17  CREATININE 1.40* 1.16* 0.80 0.90 0.83  CALCIUM 8.5* 8.3* 7.9* 7.9* 7.9*     CBC: Recent Labs  Lab 04/24/22 1309 04/25/22 1156 04/26/22 0456 04/27/22 0420 04/27/22 2132 04/28/22 0936  WBC 6.7 13.3* 11.3* 9.8  --  9.9  NEUTROABS 5.3  --   --   --   --   --   HGB 10.0* 9.1* 7.3* 6.8* 8.6* 9.9*  HCT 31.3* 29.7* 23.1* 21.0* 26.6* 30.5*  MCV 98.4 103.5* 99.6 98.6  --  94.4  PLT 246 271 186 189  --  226      Scheduled Meds:  acetaminophen  1,000 mg Oral TID   aspirin EC  81 mg Oral Daily   buPROPion  150 mg Oral q morning   docusate sodium  100 mg Oral BID   enoxaparin (LOVENOX) injection  40 mg Subcutaneous Q24H   ferrous sulfate  325 mg Oral Q breakfast   insulin aspart  0-5 Units Subcutaneous QHS   insulin aspart  0-9 Units Subcutaneous TID WC   insulin aspart  3 Units Subcutaneous TID WC   insulin glargine-yfgn  5 Units Subcutaneous BID   metoprolol tartrate  12.5 mg Oral BID   midodrine  5 mg Oral TID with meals   multivitamin with minerals  1 tablet Oral Daily   mupirocin ointment  1 Application Nasal BID   rosuvastatin  10 mg Oral QHS   senna-docusate  2 tablet Oral BID   sertraline  100 mg Oral Daily   Vitamin D (Ergocalciferol)  50,000 Units Oral Q7 days   zinc sulfate  220 mg Oral Daily    Continuous Infusions:  lactated ringers     methocarbamol (ROBAXIN) IV     potassium chloride 10 mEq (04/28/22 1455)     Assessment & Plan:   Recurrent left hip fracture Patient is s/p left IM nail removal  Patient states her pain is controlled at rest, get worse with ambulation Further management per orthopedics PT/OT are recommending SNF  DKA.  Patient developed anion gap metabolic acidosis with elevated beta hydroxybutyric acid during morning labs.  Initially ordered insulin infusion with Endo tool. Unable to transfer patient to stepdown due to no bed availability. Anion gap resolved with IV fluid and subcu insulin.  CBG roving. Discontinued insulin infusion and patient was restarted back on basal and short-acting. Message sent to diabetes coordinator regarding insulin pump. Giving some more IV fluid as bicarb remained little low although improved than before.  ABLA Hemoglobin improved to 9.9 after getting 1 unit of PRBC yesterday. -Continue iron supplement. Monitor hemoglobin Transfuse if below 7  Delirium Patient with some postop delirium, no evidence of infection, no recent use of IV pain management although she is on p.o. hydrocodone. Daughter is very concerned about UTI, UA was not consistent with UTI, no significant urinary symptoms. -Delirium precautions  AKI Resolved with IV fluid  DM 1 Patient was on an insulin pump at home. -SSI-switched her to with meal and nighttime coverage.  CAD No chest pain. -Restart home aspirin  Depression Continue sertraline and bupropion   DVT prophylaxis: SCD Code Status: DNR   Studies: No results found.  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 Errors have been  sought and corrected,but may not always be located. Such creation errors do not reflect on the standard of care.   Lorella Nimrod, [facility]  If 7PM-7AM, please contact night-coverage [email or web address]   LOS: 4 days

## 2022-04-28 NOTE — Progress Notes (Signed)
Subjective:  Patient reports pain as mild.    Objective:   VITALS:   Vitals:   04/28/22 0751 04/28/22 0843 04/28/22 1016 04/28/22 1141  BP: (!) 149/82 (!) 145/82 108/62 (!) 110/58  Pulse: (!) 113 (!) 111 97 94  Resp: '18 18 19 20  '$ Temp: 98.8 F (37.1 C)  98.7 F (37.1 C)   TempSrc:   Oral   SpO2: 98% 99% 99% 98%  Weight:      Height:        PHYSICAL EXAM:  Sensation intact distally Dorsiflexion/Plantar flexion intact Incision: dressing C/D/I No cellulitis present Compartment soft  LABS  Results for orders placed or performed during the hospital encounter of 04/24/22 (from the past 24 hour(s))  Urinalysis, Complete w Microscopic Urine, Clean Catch     Status: Abnormal   Collection Time: 04/27/22  2:05 PM  Result Value Ref Range   Color, Urine YELLOW (A) YELLOW   APPearance HAZY (A) CLEAR   Specific Gravity, Urine 1.015 1.005 - 1.030   pH 5.0 5.0 - 8.0   Glucose, UA >=500 (A) NEGATIVE mg/dL   Hgb urine dipstick NEGATIVE NEGATIVE   Bilirubin Urine NEGATIVE NEGATIVE   Ketones, ur 80 (A) NEGATIVE mg/dL   Protein, ur NEGATIVE NEGATIVE mg/dL   Nitrite NEGATIVE NEGATIVE   Leukocytes,Ua NEGATIVE NEGATIVE   WBC, UA 0-5 0 - 5 WBC/hpf   Bacteria, UA RARE (A) NONE SEEN   Squamous Epithelial / HPF 0-5 0 - 5 /HPF   Mucus PRESENT    Hyaline Casts, UA PRESENT   Glucose, capillary     Status: Abnormal   Collection Time: 04/27/22  5:34 PM  Result Value Ref Range   Glucose-Capillary 126 (H) 70 - 99 mg/dL  Hemoglobin and hematocrit, blood     Status: Abnormal   Collection Time: 04/27/22  9:32 PM  Result Value Ref Range   Hemoglobin 8.6 (L) 12.0 - 15.0 g/dL   HCT 26.6 (L) 36.0 - 46.0 %  Glucose, capillary     Status: Abnormal   Collection Time: 04/27/22 10:15 PM  Result Value Ref Range   Glucose-Capillary 275 (H) 70 - 99 mg/dL  Glucose, capillary     Status: Abnormal   Collection Time: 04/28/22  7:57 AM  Result Value Ref Range   Glucose-Capillary 341 (H) 70 - 99 mg/dL   CBC     Status: Abnormal   Collection Time: 04/28/22  9:36 AM  Result Value Ref Range   WBC 9.9 4.0 - 10.5 K/uL   RBC 3.23 (L) 3.87 - 5.11 MIL/uL   Hemoglobin 9.9 (L) 12.0 - 15.0 g/dL   HCT 30.5 (L) 36.0 - 46.0 %   MCV 94.4 80.0 - 100.0 fL   MCH 30.7 26.0 - 34.0 pg   MCHC 32.5 30.0 - 36.0 g/dL   RDW 16.2 (H) 11.5 - 15.5 %   Platelets 226 150 - 400 K/uL   nRBC 0.0 0.0 - 0.2 %  Basic metabolic panel     Status: Abnormal   Collection Time: 04/28/22  9:36 AM  Result Value Ref Range   Sodium 136 135 - 145 mmol/L   Potassium 3.2 (L) 3.5 - 5.1 mmol/L   Chloride 107 98 - 111 mmol/L   CO2 13 (L) 22 - 32 mmol/L   Glucose, Bld 298 (H) 70 - 99 mg/dL   BUN 17 8 - 23 mg/dL   Creatinine, Ser 0.90 0.44 - 1.00 mg/dL   Calcium 7.9 (L) 8.9 - 10.3  mg/dL   GFR, Estimated >60 >60 mL/min   Anion gap 16 (H) 5 - 15  Glucose, capillary     Status: Abnormal   Collection Time: 04/28/22 11:18 AM  Result Value Ref Range   Glucose-Capillary 201 (H) 70 - 99 mg/dL    No results found.  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

## 2022-04-28 NOTE — Inpatient Diabetes Management (Signed)
Inpatient Diabetes Program Recommendations  AACE/ADA: New Consensus Statement on Inpatient Glycemic Control (2015)  Target Ranges:  Prepandial:   less than 140 mg/dL      Peak postprandial:   less than 180 mg/dL (1-2 hours)      Critically ill patients:  140 - 180 mg/dL   Lab Results  Component Value Date   GLUCAP 201 (H) 04/28/2022   HGBA1C 7.2 (H) 04/01/2022    Latest Reference Range & Units 04/28/22 09:36  Sodium 135 - 145 mmol/L 136  Potassium 3.5 - 5.1 mmol/L 3.2 (L)  Chloride 98 - 111 mmol/L 107  CO2 22 - 32 mmol/L 13 (L)  Glucose 70 - 99 mg/dL 298 (H)  BUN 8 - 23 mg/dL 17  Creatinine 0.44 - 1.00 mg/dL 0.90  Calcium 8.9 - 10.3 mg/dL 7.9 (L)  Anion gap 5 - 15  16 (H)  GFR, Estimated >60 mL/min >60  (L): Data is abnormally low (H): Data is abnormally high  Diabetes history: DM1(does not make insulin.  Needs correction, basal and meal coverage)   Outpatient Diabetes medications:  Omnipod insulin pump with the Dexcom G6 CGM Dr. Honor Junes is her endocrinologist  Basal rates 12 AM = 0.3 9 AM = 0.5 12 PM = 0.45 6 PM = 0.4 9 PM = 0.2 TDD basal: 8.7 units  Bolus settings I/C: 13. ISF: 60 Target Glucose: 120 Active insulin time: 5 hours  Current orders for Inpatient glycemic control:    Inpatient Diabetes Program Recommendations:   Received consult.  Patient is well known to the diabetes team.  She is very sensitive to insulin (1 units brings her down 60 mg/dL).   Recent discharge from CIR. Uses an insulin pump at home.   Patient did not receive basal insulin yesterday. Please consider when transitioning off of IV insulin:   Novolog 0-6 units TID and 0-5 units QHS Novolog 2 units TID with meals  (Already given today) Semglee 5 units QD (0.1 units x 49.9 kg)  Thank you, [name] E. Hezekiah Veltre, RN, MSN, CDE  Diabetes Coordinator Inpatient Glycemic Control Team Team Pager [phone] (8am-5pm) 04/28/2022 1:46 PM

## 2022-04-28 NOTE — TOC Progression Note (Signed)
Transition of Care [facility]) - Progression Note    Patient Details  Name: LAZARIAH SAVARD MRN: [id number] Date of Birth: Jan 15, 1946  [facility] CM/SW [place], RN Phone Number: 04/28/2022, 12:06 PM  Clinical Narrative:    The patient is being moved to step down, I notified C[facility] at [facility] that the patient is not medically stable for DC at this time, I let her know that we would reach back out to her at [phone] to see if they can offer a bed.   Expected Discharge Plan: Skilled Nursing Facility Barriers to Discharge: SNF Pending bed offer  Expected Discharge Plan and Services   Discharge Planning Services: CM Consult   Living arrangements for the past 2 months: [place] (has been at [facility])                 DME Arranged: N/A                     Social Determinants of Health (SDOH) Interventions SDOH Screenings   Food Insecurity: No Food Insecurity (04/24/2022)  Housing: Low Risk  (04/24/2022)  Transportation Needs: No Transportation Needs (04/24/2022)  Utilities: Not At Risk (04/24/2022)  Tobacco Use: Medium Risk (04/27/2022)    Readmission Risk Interventions    04/28/2022   10:10 AM  Readmission Risk Prevention Plan  Transportation Screening Complete  Medication Review (RN Care Manager) Referral to Pharmacy  PCP or Specialist appointment within 3-5 days of discharge Complete  HRI or Home Care Consult Complete  SW Recovery Care/Counseling Consult Complete  Palliative Care Screening Not [place] Complete

## 2022-04-28 NOTE — TOC Progression Note (Signed)
Transition of Care [facility]) - Progression Note    Patient Details  Name: Diana Frost MRN: [id number] Date of Birth: May 20, 1945  Transition of Care [facility]) CM/SW [place], RN Phone Number: 04/28/2022, 11:47 AM  Clinical Narrative:     Johnette Abraham at [facility] called and asked me to fax the latest provider notes and labs, faxed those to [phone]  Expected Discharge Plan: [place] Barriers to Discharge: SNF Pending bed offer  Expected Discharge Plan and Services   Discharge Planning Services: CM Consult   Living arrangements for the past 2 months: [place] (has been at [facility])                 DME Arranged: N/A                     Social Determinants of Health ([place]) Interventions SDOH Screenings   Food Insecurity: No Food Insecurity (04/24/2022)  Housing: Low Risk  (04/24/2022)  Transportation Needs: No Transportation Needs (04/24/2022)  Utilities: Not At Risk (04/24/2022)  Tobacco Use: Medium Risk (04/27/2022)    Readmission Risk Interventions    04/28/2022   10:10 AM  Readmission Risk Prevention Plan  Transportation Screening Complete  Medication Review (RN Care Manager) Referral to Pharmacy  PCP or Specialist appointment within 3-5 days of discharge Complete  HRI or Home Care Consult Complete  SW Recovery Care/Counseling Consult Complete  [place] Complete

## 2022-04-29 DIAGNOSIS — E1059 Type 1 diabetes mellitus with other circulatory complications: Secondary | ICD-10-CM | POA: Diagnosis not present

## 2022-04-29 DIAGNOSIS — I25118 Atherosclerotic heart disease of native coronary artery with other forms of angina pectoris: Secondary | ICD-10-CM | POA: Diagnosis not present

## 2022-04-29 DIAGNOSIS — S72302K Unspecified fracture of shaft of left femur, subsequent encounter for closed fracture with nonunion: Secondary | ICD-10-CM | POA: Diagnosis not present

## 2022-04-29 DIAGNOSIS — F32A Depression, unspecified: Secondary | ICD-10-CM | POA: Diagnosis not present

## 2022-04-29 LAB — GLUCOSE, CAPILLARY
Glucose-Capillary: 133 mg/dL — ABNORMAL HIGH (ref 70–99)
Glucose-Capillary: 98 mg/dL (ref 70–99)
Glucose-Capillary: 131 mg/dL — ABNORMAL HIGH (ref 70–99)
Glucose-Capillary: 196 mg/dL — ABNORMAL HIGH (ref 70–99)

## 2022-04-29 LAB — BASIC METABOLIC PANEL
Anion gap: 4 — ABNORMAL LOW (ref 5–15)
Anion gap: 8 (ref 5–15)
BUN: 11 mg/dL (ref 8–23)
BUN: 9 mg/dL (ref 8–23)
CO2: 24 mmol/L (ref 22–32)
CO2: 25 mmol/L (ref 22–32)
Calcium: 7.8 mg/dL — ABNORMAL LOW (ref 8.9–10.3)
Calcium: 7.8 mg/dL — ABNORMAL LOW (ref 8.9–10.3)
Chloride: 106 mmol/L (ref 98–111)
Chloride: 108 mmol/L (ref 98–111)
Creatinine, Ser: 0.57 mg/dL (ref 0.44–1.00)
Creatinine, Ser: 0.69 mg/dL (ref 0.44–1.00)
GFR, Estimated: >60 mL/min (ref >60)
GFR, Estimated: >60 mL/min (ref >60)
Glucose, Bld: 100 mg/dL — ABNORMAL HIGH (ref 70–99)
Glucose, Bld: 118 mg/dL — ABNORMAL HIGH (ref 70–99)
Potassium: 3.4 mmol/L — ABNORMAL LOW (ref 3.5–5.1)
Potassium: 3.5 mmol/L (ref 3.5–5.1)
Sodium: 137 mmol/L (ref 135–145)
Sodium: 138 mmol/L (ref 135–145)

## 2022-04-29 LAB — BETA-HYDROXYBUTYRIC ACID: Beta-Hydroxybutyric Acid: 1.05 mmol/L — ABNORMAL HIGH (ref 0.05–0.27)

## 2022-04-29 MED ORDER — ASPIRIN 81 MG PO TBEC: 81.0000 mg | DELAYED_RELEASE_TABLET | Freq: Two times a day (BID) | ORAL | 0 refills | Status: AC

## 2022-04-29 MED ORDER — HYDROCODONE-ACETAMINOPHEN 5-325 MG PO TABS: 1.0000 | ORAL_TABLET | ORAL | 0 refills | Status: AC | PRN

## 2022-04-29 MED ORDER — DOCUSATE SODIUM 100 MG PO CAPS: 100.0000 mg | ORAL_CAPSULE | Freq: Two times a day (BID) | ORAL | 0 refills | Status: AC

## 2022-04-29 NOTE — Progress Notes (Signed)
Physical Therapy Treatment Patient Details Name: Diana Frost MRN: [id number] DOB: [birth year] Today's Date: 04/29/2022   History of Present Illness Pt is a 77 y/o F admitted on 04/24/22 following a fall. Pt with recent L femoral neck fx & s/p ORIF 03/29/22. Pt now with L proximal femur fx. Pt underwent L IM placement & hardware removal by Dr. Harlow Mares on 04/25/22. PMH: DM1 on insulin pump, CAD, HTN    PT Comments    Pt lethargic and reports being sleepy on arrival but was able to participate relatively well with PT with consistent encouragement.  Pt reports pain as minimal at rest but with essentially any movement L hip pain is a significant limiter.  Pt was able to do some AROM/strengthining exercises with L LE but clearly pain is contributing to her functional weakness.  Pt showed good effort in getting up to sitting EOB and then to standing, ultimately needing modA for most mobility.  Pt able to maintain [place] with transfer to the recliner but hesitant to put much weight at all through L making transfers/stepping to recliner a difficult task. Pt continues to be limited functionally and will need STR at discharge.  Continue PT per POC.   Recommendations for follow up therapy are one component of a multi-disciplinary discharge planning process, led by the attending physician.  Recommendations may be updated based on patient status, additional functional criteria and insurance authorization.  Follow Up Recommendations  Skilled nursing-short term rehab (<3 hours/day) Can patient physically be transported by private vehicle: No   Assistance Recommended at Discharge Frequent or constant Supervision/Assistance  Patient can return home with the following Direct supervision/assist for medications management;Help with stairs or ramp for entrance;A lot of help with walking and/or transfers;A lot of help with bathing/dressing/bathroom;Assist for transportation;Assistance with cooking/housework;Direct  supervision/assist for financial management   Equipment Recommendations  None recommended by PT    Recommendations for Other Services       Precautions / Restrictions Precautions Precautions: Fall Restrictions Weight Bearing Restrictions: Yes LLE Weight Bearing: Partial weight bearing LLE Partial Weight Bearing Percentage or Pounds: 50     Mobility  Bed Mobility Overal bed mobility: Needs Assistance Bed Mobility: Supine to Sit     Supine to sit: Mod assist     General bed mobility comments: Pt showed effort with trying to get to EOB, able to do some movement with LEs toward EOB but ultimately needed considerable assist to turn and to lift trunk up to sitting    Transfers Overall transfer level: Needs assistance Equipment used: Rolling walker (2 wheels) Transfers: Sit to/from Stand Sit to Stand: Mod assist           General transfer comment: Pt showed good effort but did need assist to initiate and maintain upward momentum to get to standing.    Ambulation/Gait Ambulation/Gait assistance: Mod assist, Max assist Gait Distance (Feet): 3 Feet Assistive device: Rolling walker (2 wheels)         General Gait Details: Pt struggled to take any real steps, though she did show ability to slide/shuffle feet to get toward recliner.  Heavy VCs for encouragement and to insure PWBing.   Stairs             Wheelchair Mobility    Modified Rankin (Stroke Patients Only)       Balance Overall balance assessment: Needs assistance Sitting-balance support: Feet supported, Bilateral upper extremity supported Sitting balance-Leahy Scale: Fair     Standing balance support: During functional  activity, Bilateral upper extremity supported, Reliant on assistive device for balance Standing balance-Leahy Scale: Poor Standing balance comment: leaning heavily on walker, lacking standing tolerance/confidence.                            Cognition  Arousal/Alertness: Lethargic Behavior During Therapy: WFL for tasks assessed/performed                                   General Comments: Pt was able to follow simple cues well today, showed good effort and relative awareness with mobility and general conversation despite feeling very tired        Exercises General Exercises - Lower Extremity Ankle Circles/Pumps: AROM, Strengthening, 10 reps Quad Sets: Strengthening, 10 reps Short Arc Quad: AROM, 10 reps, Left Heel Slides: AAROM, 10 reps, Left (limited tolerance for AROM > 50  - lightly resisted leg ext) Hip ABduction/ADduction: AROM, AAROM, 10 reps, Left    General Comments General comments (skin integrity, edema, etc.): Pt showed good genuine effort despite pain and functional limitations      Pertinent Vitals/Pain Pain Assessment Pain Assessment: Faces Faces Pain Scale: Hurts even more Pain Location: L hip and leg    Home Living                          Prior Function            PT Goals (current goals can now be found in the care plan section) Progress towards PT goals: Progressing toward goals    Frequency    BID      PT Plan Current plan remains appropriate    Co-evaluation              AM-PAC PT "6 Clicks" Mobility   Outcome Measure  Help needed turning from your back to your side while in a flat bed without using bedrails?: A Lot Help needed moving from lying on your back to sitting on the side of a flat bed without using bedrails?: A Lot Help needed moving to and from a bed to a chair (including a wheelchair)?: A Lot Help needed standing up from a chair using your arms (e.g., wheelchair or bedside chair)?: A Lot Help needed to walk in hospital room?: A Lot Help needed climbing 3-5 steps with a railing? : Total 6 Click Score: 11    End of Session Equipment Utilized During Treatment: Gait belt Activity Tolerance: Patient limited by lethargy;Patient limited by  pain Patient left: with chair alarm set;with call bell/phone within reach Nurse Communication: Mobility status PT Visit Diagnosis: Pain;Difficulty in walking, not elsewhere classified (R26.2);Other abnormalities of gait and mobility (R26.89);Muscle weakness (generalized) (M62.81);History of falling (Z91.81);Unsteadiness on feet (R26.81) Pain - Right/Left: Left Pain - part of body: Hip     Time: 0762-2633 PT Time Calculation (min) (ACUTE ONLY): 27 min  Charges:  $Therapeutic Exercise: 8-22 mins $Therapeutic Activity: 8-22 mins                     Kreg Shropshire, DPT 04/29/2022, 11:56 AM

## 2022-04-29 NOTE — Plan of Care (Signed)
  Problem: Activity: Goal: Ability to ambulate and perform ADLs will improve Outcome: Progressing   Problem: Skin Integrity: Goal: Risk for impaired skin integrity will decrease Outcome: Progressing   Problem: Clinical Measurements: Goal: Ability to maintain clinical measurements within normal limits will improve Outcome: Progressing Goal: Will remain free from infection Outcome: Progressing   Problem: Activity: Goal: Risk for activity intolerance will decrease Outcome: Progressing   Problem: Elimination: Goal: Will not experience complications related to bowel motility Outcome: Progressing

## 2022-04-29 NOTE — Progress Notes (Signed)
PROGRESS NOTE    NESA DISTEL  QMG:[id number]  DOB: 11-Dec-1945  DOA: 04/24/2022 PCP: Latanya Maudlin, NP Outpatient Specialists:   Hospital course:  77 year old female with DM2, HTN, CAD s/p recent left femoral neck fracture s/p ORIF 03/29/2022 was readmitted yesterday with another fall again on her left hip and new left proximal femur fracture.  1/22: Hemodynamically stable, s/p IM nail removal by orthopedic surgery.  PT/OT recommending SNF. Concern of some intermittent confusion, daughter asking to check UA as she becomes confused with UTI- Hemoglobin decreased to 6.8-ordered 1 unit of PRBC  1/23: Patient was feeling little more lethargic.  Vital stable.  Labs with improving hemoglobin to 9.9 but did show anion gap metabolic acidosis consistent with DKA with elevated blood glucose level.  Endo tool per DKA protocol ordered but unable to transfer patient to stepdown due to no bed availability.  Anion gap resolved with insulin and IV fluid. Transfer was canceled and patient was restarted on basal and short-acting.  Also giving some extra fluid. Daughter was concerned about restarting insulin pump, stating that is the only way to keep her blood glucose level well-controlled.  Message sent to diabetes coordinator and pharmacy to help. Also starting her on low-dose metoprolol for tachycardia.  1/24: Hemodynamically stable.  CBG improved, acidosis resolved and beta-hydroxybutyrate acid improving.  Awaiting SNF placement.   Subjective: Patient was seen and examined today.  She was little somnolent and denies any pain.  Stating that she did not get a good night sleep last night.   Objective: Vitals:   04/28/22 2246 04/28/22 2247 04/28/22 2349 04/29/22 0754  BP: 137/76 136/76 132/74 138/79  Pulse: 95 93 91 92  Resp:   18 16  Temp:   97.8 F (36.6 C) 98.2 F (36.8 C)  TempSrc:      SpO2: 97%  97% 92%  Weight:      Height:        Intake/Output Summary (Last 24 hours) at  04/29/2022 1315 Last data filed at 04/29/2022 1100 Gross per 24 hour  Intake 1325.5 ml  Output 850 ml  Net 475.5 ml    Filed Weights   04/24/22 1250  Weight: 49.9 kg     Exam:  General.  Frail elderly lady, all in no acute distress. Pulmonary.  Lungs clear bilaterally, normal respiratory effort. CV.  Regular rate and rhythm, no JVD, rub or murmur. Abdomen.  Soft, nontender, nondistended, BS positive. CNS.  Alert and oriented .  No focal neurologic deficit. Extremities.  No edema, no cyanosis, pulses intact and symmetrical. Psychiatry.  Judgment and insight appears normal.   Data Reviewed:  Basic Metabolic Panel: Recent Labs  Lab 04/28/22 1246 04/28/22 1612 04/28/22 2009 04/29/22 0026 04/29/22 0452  NA 136 137 135 137 138  K 3.4* 3.8 4.1 3.4* 3.5  CL 107 106 107 108 106  CO2 20* 22 21* 25 24  GLUCOSE 190* 76 229* 118* 100*  BUN '17 16 14 11 9  '$ CREATININE 0.83 0.65 0.72 0.69 0.57  CALCIUM 7.9* 8.0* 7.7* 7.8* 7.8*     CBC: Recent Labs  Lab 04/24/22 1309 04/25/22 1156 04/26/22 0456 04/27/22 0420 04/27/22 2132 04/28/22 0936  WBC 6.7 13.3* 11.3* 9.8  --  9.9  NEUTROABS 5.3  --   --   --   --   --   HGB 10.0* 9.1* 7.3* 6.8* 8.6* 9.9*  HCT 31.3* 29.7* 23.1* 21.0* 26.6* 30.5*  MCV 98.4 103.5* 99.6 98.6  --  94.4  PLT 246 271 186 189  --  226      Scheduled Meds:  acetaminophen  1,000 mg Oral TID   aspirin EC  81 mg Oral Daily   buPROPion  150 mg Oral q morning   docusate sodium  100 mg Oral BID   enoxaparin (LOVENOX) injection  40 mg Subcutaneous Q24H   ferrous sulfate  325 mg Oral Q breakfast   insulin aspart  0-5 Units Subcutaneous QHS   insulin aspart  0-9 Units Subcutaneous TID WC   insulin glargine-yfgn  5 Units Subcutaneous Daily   metoprolol tartrate  12.5 mg Oral BID   midodrine  5 mg Oral TID with meals   multivitamin with minerals  1 tablet Oral Daily   rosuvastatin  10 mg Oral QHS   senna-docusate  2 tablet Oral BID   sertraline  100 mg  Oral Daily   Vitamin D (Ergocalciferol)  50,000 Units Oral Q7 days   zinc sulfate  220 mg Oral Daily   Continuous Infusions:  methocarbamol (ROBAXIN) IV       Assessment & Plan:   Recurrent left hip fracture Patient is s/p left IM nail removal  Patient states her pain is controlled at rest, get worse with ambulation Further management per orthopedics PT/OT are recommending SNF  DKA.  Resolved. Patient developed anion gap metabolic acidosis with elevated beta hydroxybutyric acid during morning labs.  Initially ordered insulin infusion with Endo tool. Unable to transfer patient to stepdown due to no bed availability. Anion gap resolved with IV fluid and subcu insulin.  CBG roving. Discontinued insulin infusion and patient was restarted back on basal and short-acting. Message sent to diabetes coordinator regarding insulin pump. Giving some more IV fluid as bicarb remained little low although improved than before.  ABLA Hemoglobin improved to 9.9 after getting 1 unit of PRBC yesterday. -Continue iron supplement. Monitor hemoglobin Transfuse if below 7  Delirium Patient with some postop delirium, no evidence of infection, no recent use of IV pain management although she is on p.o. hydrocodone. Daughter is very concerned about UTI, UA was not consistent with UTI, no significant urinary symptoms. -Delirium precautions  AKI Resolved with IV fluid  DM 1 Patient was on an insulin pump at home. -SSI-switched her to with meal and nighttime coverage.  CAD No chest pain. -Restart home aspirin  Depression Continue sertraline and bupropion   DVT prophylaxis: SCD Code Status: DNR   Studies: No results found.  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 Errors have been sought and corrected,but may not always be located. Such creation errors do not reflect on the standard of care.   Lorella Nimrod, [facility]  If 7PM-7AM, please contact night-coverage [email or web address]   LOS: 5 days

## 2022-04-29 NOTE — Progress Notes (Signed)
Occupational Therapy Treatment Patient Details Name: Diana Frost MRN: [id number] DOB: 02/05/1946 Today's Date: 04/29/2022   History of present illness Pt is a 77 y/o F admitted on 04/24/22 following a fall. Pt with recent L femoral neck fx & s/p ORIF 03/29/22. Pt now with L proximal femur fx. Pt underwent L IM placement & hardware removal by Dr. Harlow Mares on 04/25/22. PMH: DM1 on insulin pump, CAD, HTN   OT comments  Chart reviewed, pt greeted in bed agreeable to OT tx session. Tx session targeted improving ADL performance via task oriented training. Improvements noted in seated balance, tolerance for seated grooming tasks requiring supervision. Slight improvements also noted in mobility with pt performing STS with MIN-MOD A with RW, three lateral steps up the bed to the R with all step by step multi modal cueing for technique. Pt is left in bed with daughter present, all needs met. Discharge recommendation remains appropriate. OT will follow acutely.    Recommendations for follow up therapy are one component of a multi-disciplinary discharge planning process, led by the attending physician.  Recommendations may be updated based on patient status, additional functional criteria and insurance authorization.    Follow Up Recommendations  Skilled nursing-short term rehab (<3 hours/day)     Assistance Recommended at Discharge Frequent or constant Supervision/Assistance  Patient can return home with the following  A lot of help with bathing/dressing/bathroom;A lot of help with walking and/or transfers;Assistance with cooking/housework;Assist for transportation;Help with stairs or ramp for entrance;Direct supervision/assist for financial management;Direct supervision/assist for medications management   Equipment Recommendations  Other (comment) (per next venue of care)    Recommendations for Other Services      Precautions / Restrictions Precautions Precautions: Fall Restrictions Weight  Bearing Restrictions: Yes LLE Weight Bearing: Partial weight bearing LLE Partial Weight Bearing Percentage or Pounds: 50       Mobility Bed Mobility Overal bed mobility: Needs Assistance Bed Mobility: Supine to Sit, Sit to Supine     Supine to sit: Mod assist Sit to supine: Mod assist        Transfers Overall transfer level: Needs assistance Equipment used: Rolling walker (2 wheels) Transfers: Sit to/from Stand Sit to Stand: Min assist, Mod assist           General transfer comment: step by step vcs for heavy weight bearing through BUE for adherence to Rankin precautions, noted carry over from previous therapy sessions     Balance Overall balance assessment: Needs assistance Sitting-balance support: Feet supported, Bilateral upper extremity supported Sitting balance-Leahy Scale: Good     Standing balance support: During functional activity, Bilateral upper extremity supported, Reliant on assistive device for balance Standing balance-Leahy Scale: Poor                 High Level Balance Comments: three lateral steps up the bed with RW to the R with MOD A, good adherence to PWBing precautions           ADL either performed or assessed with clinical judgement   ADL Overall ADL's : Needs assistance/impaired     Grooming: Supervision/safety;Sitting;Wash/dry face;Oral care;Brushing hair Grooming Details (indicate cue type and reason): seated at edge of bed                                    Extremity/Trunk Assessment              Vision  Perception     Praxis      Cognition Arousal/Alertness: Lethargic Behavior During Therapy: WFL for tasks assessed/performed Overall Cognitive Status: Impaired/Different from baseline Area of Impairment: Memory, Safety/judgement, Awareness, Problem solving, Attention, Following commands                   Current Attention Level: Focused Memory: Decreased recall of precautions,  Decreased short-term memory Following Commands: Follows one step commands with increased time Safety/Judgement: Decreased awareness of safety, Decreased awareness of deficits Awareness: Intellectual Problem Solving: Slow processing, Requires verbal cues, Requires tactile cues          Exercises      Shoulder Instructions       General Comments Pt continues to need plenty of cuing and    Pertinent Vitals/ Pain       Pain Assessment Pain Assessment: Faces Faces Pain Scale: Hurts little more Pain Location: LLE Pain Descriptors / Indicators: Discomfort, Grimacing Pain Intervention(s): Monitored during session, Repositioned  Home Living                                          Prior Functioning/Environment              Frequency  Min 2X/week        Progress Toward Goals  OT Goals(current goals can now be found in the care plan section)  Progress towards OT goals: Progressing toward goals     Plan Discharge plan remains appropriate    Co-evaluation                 AM-PAC OT "6 Clicks" Daily Activity     Outcome Measure   Help from another person eating meals?: None Help from another person taking care of personal grooming?: None Help from another person toileting, which includes using toliet, bedpan, or urinal?: A Lot Help from another person bathing (including washing, rinsing, drying)?: A Lot Help from another person to put on and taking off regular upper body clothing?: A Little Help from another person to put on and taking off regular lower body clothing?: A Lot 6 Click Score: 17    End of Session Equipment Utilized During Treatment: Rolling walker (2 wheels)  OT Visit Diagnosis: Pain;Other abnormalities of gait and mobility (R26.89);Muscle weakness (generalized) (M62.81);History of falling (Z91.81)   Activity Tolerance Patient tolerated treatment well   Patient Left in bed;with call bell/phone within reach;with bed alarm  set;with family/visitor present   Nurse Communication Mobility status        Time: 1450-1510 OT Time Calculation (min): 20 min  Charges: OT General Charges $OT Visit: 1 Visit OT Treatments $Self Care/Home Management : 8-22 mins  Shanon Payor, OTD OTR/L  04/29/22, 3:51 PM

## 2022-04-29 NOTE — Progress Notes (Signed)
Physical Therapy Treatment Patient Details Name: Diana Frost MRN: [id number] DOB: [birth year] Today's Date: 04/29/2022   History of Present Illness Pt is a 77 y/o F admitted on 04/24/22 following a fall. Pt with recent L femoral neck fx & s/p ORIF 03/29/22. Pt now with L proximal femur fx. Pt underwent L IM placement & hardware removal by Dr. Harlow Mares on 04/25/22. PMH: DM1 on insulin pump, CAD, HTN    PT Comments    Pt remains pleasant, motivated but lethargic.  She was better able to tolerate standing this afternoon with increased awareness for PWBing and UE use, better ability to lift and step with b/l LEs and though she is still having pain was not nearly as hesitant or functionally limited due to this.  Overall pt making slow but consistent improvement with PT.  Continue with POC.     Recommendations for follow up therapy are one component of a multi-disciplinary discharge planning process, led by the attending physician.  Recommendations may be updated based on patient status, additional functional criteria and insurance authorization.  Follow Up Recommendations  Skilled nursing-short term rehab (<3 hours/day) Can patient physically be transported by private vehicle: No   Assistance Recommended at Discharge Frequent or constant Supervision/Assistance  Patient can return home with the following Direct supervision/assist for medications management;Help with stairs or ramp for entrance;A lot of help with walking and/or transfers;A lot of help with bathing/dressing/bathroom;Assist for transportation;Assistance with cooking/housework;Direct supervision/assist for financial management   Equipment Recommendations  None recommended by PT    Recommendations for Other Services       Precautions / Restrictions Precautions Precautions: Fall Restrictions Weight Bearing Restrictions: Yes LLE Weight Bearing: Partial weight bearing LLE Partial Weight Bearing Percentage or Pounds: 50      Mobility  Bed Mobility Overal bed mobility: Needs Assistance Bed Mobility: Sit to Supine, Rolling Rolling: Supervision (to R)   Supine to sit: Mod assist Sit to supine: Min assist   General bed mobility comments: Pt did surprisingly well getting LEs up into bed, only light assist with L LE and to help scoot up/over into bed once supine    Transfers Overall transfer level: Needs assistance Equipment used: Rolling walker (2 wheels) Transfers: Sit to/from Stand Sit to Stand: Mod assist           General transfer comment: Pt again showing good effort and with cuing good hand placement but unable to initiate upward motion w/o at least some assist.    Ambulation/Gait Ambulation/Gait assistance: Mod assist, Min assist Gait Distance (Feet): 5 Feet Assistive device: Rolling walker (2 wheels)         General Gait Details: Pt still with hesitancy and need for direct assist with standing/stepping but did show increased confidence, foot clearance and ability to utilize UEs on walker to maintain PWBing on the L.  Still quick to fatigue but took multiple actual steps from recliner to bed needing constant supervision but overall.   Stairs             Wheelchair Mobility    Modified Rankin (Stroke Patients Only)       Balance Overall balance assessment: Needs assistance Sitting-balance support: Feet supported, Bilateral upper extremity supported Sitting balance-Leahy Scale: Fair     Standing balance support: During functional activity, Bilateral upper extremity supported, Reliant on assistive device for balance Standing balance-Leahy Scale: Poor Standing balance comment: leaning heavily on walker, lacking standing tolerance/confidence.  Cognition Arousal/Alertness: Lethargic Behavior During Therapy: WFL for tasks assessed/performed Overall Cognitive Status: Impaired/Different from baseline                                  General Comments: Pt continues to be very lethargic but able to participate well enough with extra time and cuing        Exercises General Exercises - Lower Extremity Ankle Circles/Pumps: AROM, Strengthening, 10 reps Quad Sets: Strengthening, 10 reps Short Arc Quad: AROM, 10 reps, Left Heel Slides: AAROM, 10 reps, Left (lightly resisted leg ext) Hip ABduction/ADduction: AROM, AAROM, 10 reps, Left    General Comments General comments (skin integrity, edema, etc.): Pt continues to need plenty of cuing and encouragement with mobility but did better this afternoon with standing tolerance/WBing management      Pertinent Vitals/Pain Pain Assessment Pain Assessment: Faces Faces Pain Scale: Hurts little more Pain Location: L hip and leg    Home Living                          Prior Function            PT Goals (current goals can now be found in the care plan section) Progress towards PT goals: Progressing toward goals    Frequency    BID      PT Plan Current plan remains appropriate    Co-evaluation              AM-PAC PT "6 Clicks" Mobility   Outcome Measure  Help needed turning from your back to your side while in a flat bed without using bedrails?: A Little Help needed moving from lying on your back to sitting on the side of a flat bed without using bedrails?: A Little Help needed moving to and from a bed to a chair (including a wheelchair)?: A Lot Help needed standing up from a chair using your arms (e.g., wheelchair or bedside chair)?: A Lot Help needed to walk in hospital room?: A Lot Help needed climbing 3-5 steps with a railing? : Total 6 Click Score: 13    End of Session Equipment Utilized During Treatment: Gait belt Activity Tolerance: Patient limited by lethargy;Patient limited by pain Patient left: with chair alarm set;with call bell/phone within reach Nurse Communication: Mobility status PT Visit Diagnosis: Pain;Difficulty in  walking, not elsewhere classified (R26.2);Other abnormalities of gait and mobility (R26.89);Muscle weakness (generalized) (M62.81);History of falling (Z91.81);Unsteadiness on feet (R26.81) Pain - Right/Left: Left Pain - part of body: Hip     Time: 3903-0092 PT Time Calculation (min) (ACUTE ONLY): 25 min  Charges:  $Therapeutic Exercise: 8-22 mins $Therapeutic Activity: 8-22 mins                     Kreg Shropshire, DPT 04/29/2022, 3:51 PM

## 2022-04-29 NOTE — TOC Progression Note (Signed)
Transition of Care [facility]) - Progression Note    Patient Details  Name: Diana Frost MRN: [id number] Date of Birth: 13-Apr-1945  Transition of Care [facility]) CM/SW [place], RN Phone Number: 04/29/2022, 10:19 AM  Clinical Narrative:    TOC continues to follow and will need to refax the referral and Updated progress notes, labs and PT notes to [employer], phone [phone] Fax number [phone]  Expected Discharge Plan: [place] Barriers to Discharge: SNF Pending bed offer  Expected Discharge Plan and Services   Discharge Planning Services: CM Consult   Living arrangements for the past 2 months: [place] (has been at [facility])                 DME Arranged: N/A                     Social Determinants of Health ([place]) Interventions SDOH Screenings   Food Insecurity: No Food Insecurity (04/24/2022)  Housing: Low Risk  (04/24/2022)  Transportation Needs: No Transportation Needs (04/24/2022)  Utilities: Not At Risk (04/24/2022)  Tobacco Use: Medium Risk (04/27/2022)    Readmission Risk Interventions    04/28/2022   10:10 AM  Readmission Risk Prevention Plan  Transportation Screening Complete  Medication Review (RN Care Manager) Referral to Pharmacy  PCP or Specialist appointment within 3-5 days of discharge Complete  HRI or Home Care Consult Complete  SW Recovery Care/Counseling Consult Complete  [place] Complete

## 2022-04-29 NOTE — Progress Notes (Signed)
Subjective:  Patient reports pain as mild.    Objective:   VITALS:   Vitals:   04/28/22 1601 04/28/22 2246 04/28/22 2247 04/28/22 2349  BP: 136/71 137/76 136/76 132/74  Pulse: 98 95 93 91  Resp: 18   18  Temp: 98.2 F (36.8 C)   97.8 F (36.6 C)  TempSrc: Oral     SpO2: 98% 97%  97%  Weight:      Height:        PHYSICAL EXAM:  Neurologically intact ABD soft Neurovascular intact Sensation intact distally Intact pulses distally Dorsiflexion/Plantar flexion intact Incision: dressing C/D/I No cellulitis present Compartment soft  LABS  Results for orders placed or performed during the hospital encounter of 04/24/22 (from the past 24 hour(s))  Glucose, capillary     Status: Abnormal   Collection Time: 04/28/22  7:57 AM  Result Value Ref Range   Glucose-Capillary 341 (H) 70 - 99 mg/dL  CBC     Status: Abnormal   Collection Time: 04/28/22  9:36 AM  Result Value Ref Range   WBC 9.9 4.0 - 10.5 K/uL   RBC 3.23 (L) 3.87 - 5.11 MIL/uL   Hemoglobin 9.9 (L) 12.0 - 15.0 g/dL   HCT 30.5 (L) 36.0 - 46.0 %   MCV 94.4 80.0 - 100.0 fL   MCH 30.7 26.0 - 34.0 pg   MCHC 32.5 30.0 - 36.0 g/dL   RDW 16.2 (H) 11.5 - 15.5 %   Platelets 226 150 - 400 K/uL   nRBC 0.0 0.0 - 0.2 %  Basic metabolic panel     Status: Abnormal   Collection Time: 04/28/22  9:36 AM  Result Value Ref Range   Sodium 136 135 - 145 mmol/L   Potassium 3.2 (L) 3.5 - 5.1 mmol/L   Chloride 107 98 - 111 mmol/L   CO2 13 (L) 22 - 32 mmol/L   Glucose, Bld 298 (H) 70 - 99 mg/dL   BUN 17 8 - 23 mg/dL   Creatinine, Ser 0.90 0.44 - 1.00 mg/dL   Calcium 7.9 (L) 8.9 - 10.3 mg/dL   GFR, Estimated >60 >60 mL/min   Anion gap 16 (H) 5 - 15  Glucose, capillary     Status: Abnormal   Collection Time: 04/28/22 11:18 AM  Result Value Ref Range   Glucose-Capillary 201 (H) 70 - 99 mg/dL  Basic metabolic panel     Status: Abnormal   Collection Time: 04/28/22 12:46 PM  Result Value Ref Range   Sodium 136 135 - 145 mmol/L    Potassium 3.4 (L) 3.5 - 5.1 mmol/L   Chloride 107 98 - 111 mmol/L   CO2 20 (L) 22 - 32 mmol/L   Glucose, Bld 190 (H) 70 - 99 mg/dL   BUN 17 8 - 23 mg/dL   Creatinine, Ser 0.83 0.44 - 1.00 mg/dL   Calcium 7.9 (L) 8.9 - 10.3 mg/dL   GFR, Estimated >60 >60 mL/min   Anion gap 9 5 - 15  Beta-hydroxybutyric acid     Status: Abnormal   Collection Time: 04/28/22 12:46 PM  Result Value Ref Range   Beta-Hydroxybutyric Acid 1.67 (H) 0.05 - 0.27 mmol/L  Glucose, capillary     Status: None   Collection Time: 04/28/22  3:52 PM  Result Value Ref Range   Glucose-Capillary 76 70 - 99 mg/dL  Basic metabolic panel     Status: Abnormal   Collection Time: 04/28/22  4:12 PM  Result Value Ref Range   Sodium  137 135 - 145 mmol/L   Potassium 3.8 3.5 - 5.1 mmol/L   Chloride 106 98 - 111 mmol/L   CO2 22 22 - 32 mmol/L   Glucose, Bld 76 70 - 99 mg/dL   BUN 16 8 - 23 mg/dL   Creatinine, Ser 0.65 0.44 - 1.00 mg/dL   Calcium 8.0 (L) 8.9 - 10.3 mg/dL   GFR, Estimated >60 >60 mL/min   Anion gap 9 5 - 15  Glucose, capillary     Status: Abnormal   Collection Time: 04/28/22  5:03 PM  Result Value Ref Range   Glucose-Capillary 140 (H) 70 - 99 mg/dL  Basic metabolic panel     Status: Abnormal   Collection Time: 04/28/22  8:09 PM  Result Value Ref Range   Sodium 135 135 - 145 mmol/L   Potassium 4.1 3.5 - 5.1 mmol/L   Chloride 107 98 - 111 mmol/L   CO2 21 (L) 22 - 32 mmol/L   Glucose, Bld 229 (H) 70 - 99 mg/dL   BUN 14 8 - 23 mg/dL   Creatinine, Ser 0.72 0.44 - 1.00 mg/dL   Calcium 7.7 (L) 8.9 - 10.3 mg/dL   GFR, Estimated >60 >60 mL/min   Anion gap 7 5 - 15  Beta-hydroxybutyric acid     Status: Abnormal   Collection Time: 04/28/22  8:09 PM  Result Value Ref Range   Beta-Hydroxybutyric Acid 1.42 (H) 0.05 - 0.27 mmol/L  Glucose, capillary     Status: Abnormal   Collection Time: 04/28/22  9:48 PM  Result Value Ref Range   Glucose-Capillary 203 (H) 70 - 99 mg/dL  Basic metabolic panel     Status:  Abnormal   Collection Time: 04/29/22 12:26 AM  Result Value Ref Range   Sodium 137 135 - 145 mmol/L   Potassium 3.4 (L) 3.5 - 5.1 mmol/L   Chloride 108 98 - 111 mmol/L   CO2 25 22 - 32 mmol/L   Glucose, Bld 118 (H) 70 - 99 mg/dL   BUN 11 8 - 23 mg/dL   Creatinine, Ser 0.69 0.44 - 1.00 mg/dL   Calcium 7.8 (L) 8.9 - 10.3 mg/dL   GFR, Estimated >60 >60 mL/min   Anion gap 4 (L) 5 - 15  Basic metabolic panel     Status: Abnormal   Collection Time: 04/29/22  4:52 AM  Result Value Ref Range   Sodium 138 135 - 145 mmol/L   Potassium 3.5 3.5 - 5.1 mmol/L   Chloride 106 98 - 111 mmol/L   CO2 24 22 - 32 mmol/L   Glucose, Bld 100 (H) 70 - 99 mg/dL   BUN 9 8 - 23 mg/dL   Creatinine, Ser 0.57 0.44 - 1.00 mg/dL   Calcium 7.8 (L) 8.9 - 10.3 mg/dL   GFR, Estimated >60 >60 mL/min   Anion gap 8 5 - 15  Beta-hydroxybutyric acid     Status: Abnormal   Collection Time: 04/29/22  4:52 AM  Result Value Ref Range   Beta-Hydroxybutyric Acid 1.05 (H) 0.05 - 0.27 mmol/L    No results found.  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

## 2022-04-30 ENCOUNTER — Encounter: Payer: Self-pay | Admitting: Orthopedic Surgery

## 2022-04-30 DIAGNOSIS — F32A Depression, unspecified: Secondary | ICD-10-CM | POA: Diagnosis not present

## 2022-04-30 DIAGNOSIS — S72302K Unspecified fracture of shaft of left femur, subsequent encounter for closed fracture with nonunion: Secondary | ICD-10-CM | POA: Diagnosis not present

## 2022-04-30 DIAGNOSIS — I25118 Atherosclerotic heart disease of native coronary artery with other forms of angina pectoris: Secondary | ICD-10-CM | POA: Diagnosis not present

## 2022-04-30 DIAGNOSIS — E1059 Type 1 diabetes mellitus with other circulatory complications: Secondary | ICD-10-CM | POA: Diagnosis not present

## 2022-04-30 LAB — CBC
HCT: 30.8 % — ABNORMAL LOW (ref 36.0–46.0)
Hemoglobin: 10.1 g/dL — ABNORMAL LOW (ref 12.0–15.0)
MCH: 30.6 pg (ref 26.0–34.0)
MCHC: 32.8 g/dL (ref 30.0–36.0)
MCV: 93.3 fL (ref 80.0–100.0)
Platelets: 261 10*3/uL (ref 150–400)
RBC: 3.3 MIL/uL — ABNORMAL LOW (ref 3.87–5.11)
RDW: 16.3 % — ABNORMAL HIGH (ref 11.5–15.5)
WBC: 5.9 10*3/uL (ref 4.0–10.5)
nRBC: 0 % (ref 0.0–0.2)

## 2022-04-30 LAB — BASIC METABOLIC PANEL
Anion gap: 6 (ref 5–15)
BUN: 15 mg/dL (ref 8–23)
CO2: 26 mmol/L (ref 22–32)
Calcium: 7.8 mg/dL — ABNORMAL LOW (ref 8.9–10.3)
Chloride: 103 mmol/L (ref 98–111)
Creatinine, Ser: 0.74 mg/dL (ref 0.44–1.00)
GFR, Estimated: >60 mL/min (ref >60)
Glucose, Bld: 199 mg/dL — ABNORMAL HIGH (ref 70–99)
Potassium: 3.3 mmol/L — ABNORMAL LOW (ref 3.5–5.1)
Sodium: 135 mmol/L (ref 135–145)

## 2022-04-30 LAB — GLUCOSE, CAPILLARY
Glucose-Capillary: 259 mg/dL — ABNORMAL HIGH (ref 70–99)
Glucose-Capillary: 176 mg/dL — ABNORMAL HIGH (ref 70–99)
Glucose-Capillary: 152 mg/dL — ABNORMAL HIGH (ref 70–99)
Glucose-Capillary: 206 mg/dL — ABNORMAL HIGH (ref 70–99)

## 2022-04-30 MED ORDER — POTASSIUM CHLORIDE 20 MEQ PO PACK
40.0000 meq | PACK | Freq: Once | ORAL | Status: AC
  Administered 2022-04-30: 40 meq via ORAL
  Filled 2022-04-30: qty 2

## 2022-04-30 MED ORDER — ACETAMINOPHEN 325 MG PO TABS: 650.0000 mg | ORAL_TABLET | Freq: Four times a day (QID) | ORAL | Status: DC | PRN

## 2022-04-30 NOTE — Progress Notes (Signed)
Physical Therapy Treatment Patient Details Name: Diana Frost MRN: [id number] DOB: [birth year] Today's Date: 04/30/2022   History of Present Illness Pt is a 77 y/o F admitted on 04/24/22 following a fall. Pt with recent L femoral neck fx & s/p ORIF 03/29/22. Pt now with L proximal femur fx. Pt underwent L IM placement & hardware removal by Dr. Harlow Mares on 04/25/22. PMH: DM1 on insulin pump, CAD, HTN    PT Comments    Pt continues to c/o fatigue but was better able to keep eyes open, hold conversation and participate with exericses this afternoon.  She had apparently gotten back to bed not long before PT arrived and she pleasantly refused to do mobility this afternoon citing pain and fatigue.  She did show good effort with supine exercises but is still pain hesitant/limited.  Pt with good effort for what she was willing to do, continue with POC per pt tolerance.    Recommendations for follow up therapy are one component of a multi-disciplinary discharge planning process, led by the attending physician.  Recommendations may be updated based on patient status, additional functional criteria and insurance authorization.  Follow Up Recommendations  Skilled nursing-short term rehab (<3 hours/day) Can patient physically be transported by private vehicle: No   Assistance Recommended at Discharge Frequent or constant Supervision/Assistance  Patient can return home with the following Direct supervision/assist for medications management;Help with stairs or ramp for entrance;A lot of help with walking and/or transfers;A lot of help with bathing/dressing/bathroom;Assist for transportation;Assistance with cooking/housework;Direct supervision/assist for financial management   Equipment Recommendations  None recommended by PT    Recommendations for Other Services       Precautions / Restrictions Precautions Precautions: Fall Restrictions LLE Weight Bearing: Partial weight bearing LLE Partial Weight  Bearing Percentage or Pounds: 50     Mobility  Bed Mobility Overal bed mobility: Needs Assistance Bed Mobility: Supine to Sit, Sit to Supine Rolling: Supervision   Supine to sit: Mod assist     General bed mobility comments: Pt reports that she had just recently gotten back into bed from sitting most of the day and asks not to have to get up again today.    Transfers Overall transfer level: Needs assistance Equipment used: Rolling walker (2 wheels) Transfers: Sit to/from Stand Sit to Stand: Mod assist           General transfer comment: Pt struggled to initiate upward movement, needed mod assist to rise and maintain standing, poor tolerance this AM for standing activity.    Ambulation/Gait Ambulation/Gait assistance: Max assist             General Gait Details: Pt with less tolerance in standing and struggled to do much actual stepping/unweighting today.  Heavy assist to help slide/shuffle LEs and walker to safely transition to the recliner.   Stairs             Wheelchair Mobility    Modified Rankin (Stroke Patients Only)       Balance     Sitting balance-Leahy Scale: Good     Standing balance support: During functional activity, Bilateral upper extremity supported, Reliant on assistive device for balance Standing balance-Leahy Scale: Poor Standing balance comment: leaning heavily on walker (even down onto her forearms), lacking standing tolerance/confidence.                            Cognition Arousal/Alertness: Lethargic Behavior During Therapy: WFL for tasks assessed/performed  Overall Cognitive Status: Within Functional Limits for tasks assessed (Pt still sleepy but more interactive than this AM)                                          Exercises General Exercises - Lower Extremity Ankle Circles/Pumps: AROM, 20 reps Quad Sets: Strengthening, 10 reps Short Arc Quad: AROM, Strengthening, 10 reps Long Arc Quad:  AROM, 10 reps Heel Slides: AAROM, 10 reps (lightly resisted leg ext) Hip ABduction/ADduction: AAROM, 10 reps Hip Flexion/Marching: AROM, 10 reps    General Comments        Pertinent Vitals/Pain Pain Assessment Pain Assessment: Faces Faces Pain Scale: Hurts little more Pain Location: L hip.  On arrival pt reports "the worst pain ever" and did show some hesitancy with initiation of exericses but when asked later after multiple sets/reps she states "not bad at all"  Unable to explain further when questioned about this discrepancy    Home Living                          Prior Function            PT Goals (current goals can now be found in the care plan section) Progress towards PT goals: Progressing toward goals    Frequency    BID      PT Plan Current plan remains appropriate    Co-evaluation              AM-PAC PT "6 Clicks" Mobility   Outcome Measure  Help needed turning from your back to your side while in a flat bed without using bedrails?: A Lot Help needed moving from lying on your back to sitting on the side of a flat bed without using bedrails?: A Lot Help needed moving to and from a bed to a chair (including a wheelchair)?: A Lot Help needed standing up from a chair using your arms (e.g., wheelchair or bedside chair)?: A Lot Help needed to walk in hospital room?: Total Help needed climbing 3-5 steps with a railing? : Total 6 Click Score: 10    End of Session   Activity Tolerance: Patient limited by lethargy;Patient limited by pain Patient left: with bed alarm set;with call bell/phone within reach   PT Visit Diagnosis: Pain;Difficulty in walking, not elsewhere classified (R26.2);Other abnormalities of gait and mobility (R26.89);Muscle weakness (generalized) (M62.81);History of falling (Z91.81);Unsteadiness on feet (R26.81) Pain - Right/Left: Left Pain - part of body: Hip     Time: 5027-7412 PT Time Calculation (min) (ACUTE ONLY): 17  min  Charges:  $Therapeutic Exercise: 8-22 mins                     Kreg Shropshire, DPT 04/30/2022, 4:10 PM

## 2022-04-30 NOTE — Plan of Care (Signed)
  Problem: Activity: Goal: Ability to ambulate and perform ADLs will improve Outcome: Progressing   Problem: Pain Managment: Goal: General experience of comfort will improve Outcome: Progressing   Problem: Safety: Goal: Ability to remain free from injury will improve Outcome: Progressing   Problem: Skin Integrity: Goal: Risk for impaired skin integrity will decrease Outcome: Progressing   Problem: Urinary Elimination: Goal: Ability to achieve and maintain adequate renal perfusion and functioning will improve Outcome: Progressing

## 2022-04-30 NOTE — Plan of Care (Signed)
  Problem: Education: Goal: Verbalization of understanding the information provided (i.e., activity precautions, restrictions, etc) will improve Outcome: Progressing   Problem: Self-Concept: Goal: Ability to maintain and perform role responsibilities to the fullest extent possible will improve Outcome: Progressing   Problem: Pain Management: Goal: Pain level will decrease Outcome: Progressing   Problem: Education: Goal: Ability to describe self-care measures that may prevent or decrease complications (Diabetes Survival Skills Education) will improve Outcome: Progressing   Problem: Skin Integrity: Goal: Risk for impaired skin integrity will decrease Outcome: Progressing   Problem: Tissue Perfusion: Goal: Adequacy of tissue perfusion will improve Outcome: Progressing

## 2022-04-30 NOTE — TOC Progression Note (Signed)
Transition of Care [facility]) - Progression Note    Patient Details  Name: Diana Frost MRN: [id number] Date of Birth: 05-20-1945  Transition of [facility]) CM/SW [place], [place] Phone Number: 04/30/2022, 1:23 PM  Clinical Narrative:     CSW spoke with Cari at [facility] at phone [phone] , she reports they are not able to accept patient or make a bed offer and they have updated family.   CSW spoke with patient's daughter [name], she confirms receiving above information. Requests follow up with [facility] in [place] and [facility] in [place].   [place] spoke with Magda Paganini at [facility] they are reviewing patient's chart to identify if they can offer a bed.   CSW has reached out to two [facility] facilities:  [facility] # [phone] Admissions: [facility]: [phone] Referral has been faxed  [place] #: [phone] Admissions: [facility] requesting fax or email to send referral  Expected Discharge Plan: [place] Barriers to Discharge: SNF Pending bed offer  Expected Discharge Plan and Services   Discharge Planning Services: CM Consult   Living arrangements for the past 2 months: [place] (has been at [facility])                 DME Arranged: N/A                     Social Determinants of Health ([place]) Interventions [place]: No Food Insecurity (04/24/2022)  Housing: Low Risk  (04/24/2022)  Transportation Needs: No Transportation Needs (04/24/2022)  Utilities: Not At Risk (04/24/2022)  Tobacco Use: Medium Risk (04/30/2022)    Readmission Risk Interventions    04/28/2022   10:10 AM  Readmission Risk Prevention Plan  Transportation Screening Complete  Medication Review (RN Care Manager) Referral to Pharmacy  PCP or Specialist appointment within 3-5 days of discharge Complete  HRI or Home Care Consult Complete  SW Recovery Care/Counseling  Consult Complete  [place] Complete

## 2022-04-30 NOTE — Progress Notes (Signed)
PROGRESS NOTE    Diana Frost  YWV:[id number]  DOB: 1945/08/06  DOA: 04/24/2022 PCP: Latanya Maudlin, NP Outpatient Specialists:   Hospital course:  77 year old female with DM2, HTN, CAD s/p recent left femoral neck fracture s/p ORIF 03/29/2022 was readmitted yesterday with another fall again on her left hip and new left proximal femur fracture.  1/22: Hemodynamically stable, s/p IM nail removal by orthopedic surgery.  PT/OT recommending SNF. Concern of some intermittent confusion, daughter asking to check UA as she becomes confused with UTI- Hemoglobin decreased to 6.8-ordered 1 unit of PRBC  1/23: Patient was feeling little more lethargic.  Vital stable.  Labs with improving hemoglobin to 9.9 but did show anion gap metabolic acidosis consistent with DKA with elevated blood glucose level.  Endo tool per DKA protocol ordered but unable to transfer patient to stepdown due to no bed availability.  Anion gap resolved with insulin and IV fluid. Transfer was canceled and patient was restarted on basal and short-acting.  Also giving some extra fluid. Daughter was concerned about restarting insulin pump, stating that is the only way to keep her blood glucose level well-controlled.  Message sent to diabetes coordinator and pharmacy to help. Also starting her on low-dose metoprolol for tachycardia.  1/24: Hemodynamically stable.  CBG improved, acidosis resolved and beta-hydroxybutyrate acid improving.  Awaiting SNF placement.  1/25: Patient remained stable.  Awaiting SNF placement   Subjective: Patient was sitting comfortably in chair and doing some coloring bedside today.  Denies any significant pain.  Daughter at bedside.  Had a bowel movement this morning   Objective: Vitals:   04/29/22 1946 04/29/22 2324 04/30/22 0424 04/30/22 0755  BP: 115/63 124/64 121/69 135/77  Pulse: 77 88 87 89  Resp: '20 20 20 18  '$ Temp: 98.1 F (36.7 C) 98.7 F (37.1 C) 98.4 F (36.9 C) 98.8 F  (37.1 C)  TempSrc:    Oral  SpO2: 98% 95% 98% 100%  Weight:      Height:       No intake or output data in the 24 hours ending 04/30/22 1429  Filed Weights   04/24/22 1250  Weight: 49.9 kg     Exam:  General.  Frail elderly lady, in no acute distress. Pulmonary.  Lungs clear bilaterally, normal respiratory effort. CV.  Regular rate and rhythm, no JVD, rub or murmur. Abdomen.  Soft, nontender, nondistended, BS positive. CNS.  Alert and oriented .  No focal neurologic deficit. Extremities.  No edema, no cyanosis, pulses intact and symmetrical. Psychiatry.  Judgment and insight appears normal.    Data Reviewed:  Basic Metabolic Panel: Recent Labs  Lab 04/28/22 1612 04/28/22 2009 04/29/22 0026 04/29/22 0452 04/30/22 1056  NA 137 135 137 138 135  K 3.8 4.1 3.4* 3.5 3.3*  CL 106 107 108 106 103  CO2 22 21* '25 24 26  '$ GLUCOSE 76 229* 118* 100* 199*  BUN '16 14 11 9 15  '$ CREATININE 0.65 0.72 0.69 0.57 0.74  CALCIUM 8.0* 7.7* 7.8* 7.8* 7.8*     CBC: Recent Labs  Lab 04/24/22 1309 04/25/22 1156 04/26/22 0456 04/27/22 0420 04/27/22 2132 04/28/22 0936 04/30/22 1056  WBC 6.7 13.3* 11.3* 9.8  --  9.9 5.9  NEUTROABS 5.3  --   --   --   --   --   --   HGB 10.0* 9.1* 7.3* 6.8* 8.6* 9.9* 10.1*  HCT 31.3* 29.7* 23.1* 21.0* 26.6* 30.5* 30.8*  MCV 98.4 103.5* 99.6 98.6  --  94.4 93.3  PLT 246 271 186 189  --  226 261      Scheduled Meds:  acetaminophen  1,000 mg Oral TID   aspirin EC  81 mg Oral Daily   buPROPion  150 mg Oral q morning   docusate sodium  100 mg Oral BID   enoxaparin (LOVENOX) injection  40 mg Subcutaneous Q24H   ferrous sulfate  325 mg Oral Q breakfast   insulin aspart  0-5 Units Subcutaneous QHS   insulin aspart  0-9 Units Subcutaneous TID WC   insulin glargine-yfgn  5 Units Subcutaneous Daily   metoprolol tartrate  12.5 mg Oral BID   midodrine  5 mg Oral TID with meals   multivitamin with minerals  1 tablet Oral Daily   rosuvastatin  10 mg  Oral QHS   senna-docusate  2 tablet Oral BID   sertraline  100 mg Oral Daily   Vitamin D (Ergocalciferol)  50,000 Units Oral Q7 days   zinc sulfate  220 mg Oral Daily   Continuous Infusions:  methocarbamol (ROBAXIN) IV       Assessment & Plan:   Recurrent left hip fracture Patient is s/p left IM nail removal  Patient states her pain is controlled at rest, get worse with ambulation Further management per orthopedics PT/OT are recommending SNF  DKA.  Resolved. Patient developed anion gap metabolic acidosis with elevated beta hydroxybutyric acid during morning labs.  Initially ordered insulin infusion with Endo tool. Unable to transfer patient to stepdown due to no bed availability. Anion gap resolved with IV fluid and subcu insulin.  CBG roving. Discontinued insulin infusion and patient was restarted back on basal and short-acting. Message sent to diabetes coordinator regarding insulin pump. Giving some more IV fluid as bicarb remained little low although improved than before.  ABLA Hemoglobin improved to 9.9 after getting 1 unit of PRBC yesterday. -Continue iron supplement. Monitor hemoglobin Transfuse if below 7  Delirium Patient with some postop delirium, no evidence of infection, no recent use of IV pain management although she is on p.o. hydrocodone. Daughter is very concerned about UTI, UA was not consistent with UTI, no significant urinary symptoms. -Delirium precautions  AKI Resolved with IV fluid  DM 1 Patient was on an insulin pump at home. -SSI-switched her to with meal and nighttime coverage.  CAD No chest pain. -Restart home aspirin  Depression Continue sertraline and bupropion   DVT prophylaxis: SCD Code Status: DNR   Studies: No results found.  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 Errors have been sought and corrected,but may not always be located. Such creation errors do not reflect on the standard of care.   Lorella Nimrod, [facility]  If 7PM-7AM, please contact night-coverage [email or web address]   LOS: 6 days

## 2022-04-30 NOTE — Progress Notes (Signed)
  Subjective:  Patient reports pain as mild.    Objective:   VITALS:   Vitals:   04/29/22 1602 04/29/22 1946 04/29/22 2324 04/30/22 0424  BP: 114/63 115/63 124/64 121/69  Pulse: 77 77 88 87  Resp: '16 20 20 20  '$ Temp: (!) 97.5 F (36.4 C) 98.1 F (36.7 C) 98.7 F (37.1 C) 98.4 F (36.9 C)  TempSrc:      SpO2: 100% 98% 95% 98%  Weight:      Height:        PHYSICAL EXAM:  Neurologically intact ABD soft Neurovascular intact Sensation intact distally Intact pulses distally Dorsiflexion/Plantar flexion intact Incision: dressing C/D/I No cellulitis present Compartment soft  LABS  Results for orders placed or performed during the hospital encounter of 04/24/22 (from the past 24 hour(s))  Glucose, capillary     Status: Abnormal   Collection Time: 04/29/22  7:53 AM  Result Value Ref Range   Glucose-Capillary 133 (H) 70 - 99 mg/dL  Glucose, capillary     Status: None   Collection Time: 04/29/22 11:52 AM  Result Value Ref Range   Glucose-Capillary 98 70 - 99 mg/dL  Glucose, capillary     Status: Abnormal   Collection Time: 04/29/22  4:38 PM  Result Value Ref Range   Glucose-Capillary 131 (H) 70 - 99 mg/dL  Glucose, capillary     Status: Abnormal   Collection Time: 04/29/22 11:05 PM  Result Value Ref Range   Glucose-Capillary 196 (H) 70 - 99 mg/dL    No results found.  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

## 2022-04-30 NOTE — Progress Notes (Signed)
Physical Therapy Treatment Patient Details Name: Diana Frost MRN: [id number] DOB: [birth year] Today's Date: 04/30/2022   History of Present Illness Pt is a 77 y/o F admitted on 04/24/22 following a fall. Pt with recent L femoral neck fx & s/p ORIF 03/29/22. Pt now with L proximal femur fx. Pt underwent L IM placement & hardware removal by Dr. Harlow Mares on 04/25/22. PMH: DM1 on insulin pump, CAD, HTN    PT Comments    Pt continues to be pleasant and motivated but continues to struggle with fatigue and struggles to keep eyes open despite her clear desire to participate and work hard.  Ultimately fatigue and pain were significant limiters this session but we were able to do some exercises and transition to the recliner - however pt needing more assist and displaying less tolerance with this than she did last session.  Continue with POC per pt tolerance.     Recommendations for follow up therapy are one component of a multi-disciplinary discharge planning process, led by the attending physician.  Recommendations may be updated based on patient status, additional functional criteria and insurance authorization.  Follow Up Recommendations  Skilled nursing-short term rehab (<3 hours/day) Can patient physically be transported by private vehicle: No   Assistance Recommended at Discharge Frequent or constant Supervision/Assistance  Patient can return home with the following Direct supervision/assist for medications management;Help with stairs or ramp for entrance;A lot of help with walking and/or transfers;A lot of help with bathing/dressing/bathroom;Assist for transportation;Assistance with cooking/housework;Direct supervision/assist for financial management   Equipment Recommendations  None recommended by PT    Recommendations for Other Services       Precautions / Restrictions Precautions Precautions: Fall Restrictions LLE Weight Bearing: Partial weight bearing LLE Partial Weight Bearing  Percentage or Pounds: 50     Mobility  Bed Mobility Overal bed mobility: Needs Assistance Bed Mobility: Supine to Sit, Sit to Supine Rolling: Supervision   Supine to sit: Mod assist     General bed mobility comments: Pt could not get LEs/hips toward EOB w/o direct assist this date.  She did show good effort but had very little actual AROM, good effort to assist as she could when PT did ultimately need to give phyiscal assist    Transfers Overall transfer level: Needs assistance Equipment used: Rolling walker (2 wheels) Transfers: Sit to/from Stand Sit to Stand: Mod assist           General transfer comment: Pt struggled to initiate upward movement, needed mod assist to rise and maintain standing, poor tolerance this AM for standing activity.    Ambulation/Gait Ambulation/Gait assistance: Max assist             General Gait Details: Pt with less tolerance in standing and struggled to do much actual stepping/unweighting today.  Heavy assist to help slide/shuffle LEs and walker to safely transition to the recliner.   Stairs             Wheelchair Mobility    Modified Rankin (Stroke Patients Only)       Balance     Sitting balance-Leahy Scale: Good     Standing balance support: During functional activity, Bilateral upper extremity supported, Reliant on assistive device for balance Standing balance-Leahy Scale: Poor Standing balance comment: leaning heavily on walker (even down onto her forearms), lacking standing tolerance/confidence.  Cognition Arousal/Alertness: Lethargic Behavior During Therapy: WFL for tasks assessed/performed Overall Cognitive Status: Difficult to assess                                          Exercises General Exercises - Lower Extremity Ankle Circles/Pumps: AROM, 10 reps Quad Sets: Strengthening, 10 reps Long Arc Quad: AROM, 10 reps Hip ABduction/ADduction: AAROM, 10  reps Hip Flexion/Marching: AROM, 10 reps    General Comments General comments (skin integrity, edema, etc.): Pt continues to struggle with lethargy/sleepiness and again largely kept eyes closed t/o session despite willingness/desire to participate as much as possible.      Pertinent Vitals/Pain Pain Assessment Pain Assessment: Faces Faces Pain Scale: Hurts even more Pain Location: L hip    Home Living                          Prior Function            PT Goals (current goals can now be found in the care plan section) Progress towards PT goals: Progressing toward goals (slow progress)    Frequency    BID      PT Plan Current plan remains appropriate    Co-evaluation              AM-PAC PT "6 Clicks" Mobility   Outcome Measure  Help needed turning from your back to your side while in a flat bed without using bedrails?: A Lot Help needed moving from lying on your back to sitting on the side of a flat bed without using bedrails?: A Lot Help needed moving to and from a bed to a chair (including a wheelchair)?: A Lot Help needed standing up from a chair using your arms (e.g., wheelchair or bedside chair)?: A Lot Help needed to walk in hospital room?: Total Help needed climbing 3-5 steps with a railing? : Total 6 Click Score: 10    End of Session Equipment Utilized During Treatment: Gait belt Activity Tolerance: Patient limited by lethargy;Patient limited by pain Patient left: with chair alarm set;with call bell/phone within reach Nurse Communication: Mobility status PT Visit Diagnosis: Pain;Difficulty in walking, not elsewhere classified (R26.2);Other abnormalities of gait and mobility (R26.89);Muscle weakness (generalized) (M62.81);History of falling (Z91.81);Unsteadiness on feet (R26.81) Pain - Right/Left: Left Pain - part of body: Hip     Time: 3244-0102 PT Time Calculation (min) (ACUTE ONLY): 32 min  Charges:  $Therapeutic Exercise: 8-22  mins $Therapeutic Activity: 8-22 mins                     Kreg Shropshire, DPT 04/30/2022, 10:31 AM

## 2022-04-30 NOTE — Progress Notes (Addendum)
Nutrition Follow-up  DOCUMENTATION CODES:   Not applicable  INTERVENTION:   -Continue Magic cup BID with meals, each supplement provides 290 kcal and 9 grams of protein  -Continue MVI with minerals daily  NUTRITION DIAGNOSIS:   Increased nutrient needs related to post-op healing as evidenced by estimated needs.  Ongoing  GOAL:   Patient will meet greater than or equal to 90% of their needs  Progressing   MONITOR:   PO intake  REASON FOR ASSESSMENT:   Consult Hip fracture protocol  ASSESSMENT:   Pt with hx of DM type 1, CAD with hx of MI, and HTN presented to ED with pain after a fall at home. Pt was discharged from [facility] the day prior after receiving rehab from a fall and left femur fracture. Imaging in ED showed a new proximal femur fracture just distal to the operative site  Reviewed I/O's: -300 ml x 24 hours and +3.8 L since admission  UOP: 300 ml x 24 hours  Pt unavailable at time of visit. Attempted to speak with pt via call to hospital room phone, however, unable to reach.   Pt remains with good appetite. Noted meal completions 25-100%.  Pt awaiting SNF placement for discharge.   Medications reviewed and include colace, lovenox, ferrous sulfate, zinc sulfate, and vitamin D.   Labs reviewed: CBGS: 176-259 (inpatient orders for glycemic control are 0-5 units insulin aspart daily at bedtime, 0-9 units insulin aspart TID with meals, and 5 units insulin glargine-yfgn daily).    Diet Order:   Diet Order             Diet Carb Modified Fluid consistency: Thin; Room service appropriate? Yes  Diet effective now                   EDUCATION NEEDS:   Education needs have been addressed  Skin:  Skin Assessment: Skin Integrity Issues: Skin Integrity Issues:: Incisions Incisions: closed lt hip  Last BM:  04/30/22 (type 4)  Height:   Ht Readings from Last 1 Encounters:  04/24/22 '5\' 2"'$  (1.575 m)    Weight:   Wt Readings from Last 1 Encounters:   04/24/22 49.9 kg    Ideal Body Weight:  50 kg  BMI:  Body mass index is 20.12 kg/m.  Estimated Nutritional Needs:   Kcal:  1500-1700 kcal/d  Protein:  70-90g/d  Fluid:  >/=1.5L/d    Loistine Chance, RD, LDN, [place] Registered Dietitian II Certified Diabetes Care and Education Specialist Please refer to [facility] for RD and/or RD on-call/weekend/after hours pager

## 2022-05-01 DIAGNOSIS — F32A Depression, unspecified: Secondary | ICD-10-CM | POA: Diagnosis not present

## 2022-05-01 DIAGNOSIS — E1059 Type 1 diabetes mellitus with other circulatory complications: Secondary | ICD-10-CM | POA: Diagnosis not present

## 2022-05-01 DIAGNOSIS — S72302K Unspecified fracture of shaft of left femur, subsequent encounter for closed fracture with nonunion: Secondary | ICD-10-CM | POA: Diagnosis not present

## 2022-05-01 DIAGNOSIS — I25118 Atherosclerotic heart disease of native coronary artery with other forms of angina pectoris: Secondary | ICD-10-CM | POA: Diagnosis not present

## 2022-05-01 LAB — GLUCOSE, CAPILLARY
Glucose-Capillary: 330 mg/dL — ABNORMAL HIGH (ref 70–99)
Glucose-Capillary: 203 mg/dL — ABNORMAL HIGH (ref 70–99)
Glucose-Capillary: 149 mg/dL — ABNORMAL HIGH (ref 70–99)
Glucose-Capillary: 271 mg/dL — ABNORMAL HIGH (ref 70–99)

## 2022-05-01 NOTE — Progress Notes (Addendum)
Physical Therapy Treatment Patient Details Name: Diana Frost MRN: [id number] DOB: 06/08/1945 Today's Date: 05/01/2022   History of Present Illness Pt is a 77 y/o F admitted on 04/24/22 following a fall. Pt with recent L femoral neck fx & s/p ORIF 03/29/22. Pt now with L proximal femur fx. Pt underwent L IM placement & hardware removal by Dr. Harlow Frost on 04/25/22. PMH: DM1 on insulin pump, CAD, HTN    PT Comments    Pt seen for PT tx with pt agreeable. Pt performs LLE strengthening exercises with cuing & AAROM PRN without any c/o pain. Pt transferred STS x 2 with max fade to min assist with RW but pt limited by significant pain in L hip with weight bearing & then c/o dizziness. Pt tolerates standing <15 seconds each time before return to sit. Continue to recommend STR upon d/c.  BP in LUE sitting in recliner with BLE elevated after 2 standing attempts: 117/63 mmHg MAP 80, HR 90 bpm Notified nurse of benefit of orthostatic vital sign check.    Recommendations for follow up therapy are one component of a multi-disciplinary discharge planning process, led by the attending physician.  Recommendations may be updated based on patient status, additional functional criteria and insurance authorization.  Follow Up Recommendations  Skilled nursing-short term rehab (<3 hours/day) Can patient physically be transported by private vehicle: No   Assistance Recommended at Discharge Frequent or constant Supervision/Assistance  Patient can return home with the following Direct supervision/assist for medications management;Help with stairs or ramp for entrance;A lot of help with walking and/or transfers;A lot of help with bathing/dressing/bathroom;Assist for transportation;Assistance with cooking/housework;Direct supervision/assist for financial management   Equipment Recommendations  None recommended by PT    Recommendations for Other Services       Precautions / Restrictions Precautions Precautions:  Fall Restrictions Weight Bearing Restrictions: Yes LLE Weight Bearing: Partial weight bearing LLE Partial Weight Bearing Percentage or Pounds: 50     Mobility  Bed Mobility               General bed mobility comments: not tested, pt received & left sitting in recliner    Transfers Overall transfer level: Needs assistance Equipment used: Rolling walker (2 wheels) Transfers: Sit to/from Stand Sit to Stand: Max assist, Min assist, Mod assist           General transfer comment: STS x2 from recliner with cuing to scoot out to edge of seat & to push to standing. Pt requires mod<>max assist for 1st STS, min assist for 2nd. Extra time to transition BUE from chair armrests to RW.    Ambulation/Gait                   Stairs             Wheelchair Mobility    Modified Rankin (Stroke Patients Only)       Balance                                            Cognition Arousal/Alertness: Awake/alert Behavior During Therapy: WFL for tasks assessed/performed Overall Cognitive Status: Within Functional Limits for tasks assessed Area of Impairment: Memory, Safety/judgement, Awareness, Problem solving, Attention, Following commands                     Memory: Decreased short-term memory, Decreased recall of precautions  Following Commands: Follows one step commands with increased time Safety/Judgement: Decreased awareness of safety, Decreased awareness of deficits Awareness: Intellectual Problem Solving: Slow processing, Requires verbal cues, Requires tactile cues General Comments: Pt follows simple commands throughout session.        Exercises General Exercises - Lower Extremity Long Arc Quad: AROM, Strengthening, Left, 10 reps, Seated Heel Slides: AROM, Strengthening, Left, 10 reps Hip ABduction/ADduction: AAROM, Strengthening, Left, 10 reps (hip abduction slides) Straight Leg Raises: AAROM, Strengthening, Left, 10 reps     General Comments        Pertinent Vitals/Pain Pain Assessment Pain Assessment: Faces Faces Pain Scale: Hurts whole lot Pain Location: L thigh in standing/weight bearing Pain Descriptors / Indicators: Discomfort, Grimacing Pain Intervention(s): Repositioned, Monitored during session, Premedicated before session    Home Living                          Prior Function            PT Goals (current goals can now be found in the care plan section) Acute Rehab PT Goals Patient Stated Goal: decreased pain PT Goal Formulation: With patient Time For Goal Achievement: 05/10/22 Potential to Achieve Goals: Fair Progress towards PT goals: Progressing toward goals    Frequency    BID      PT Plan Current plan remains appropriate    Co-evaluation              AM-PAC PT "6 Clicks" Mobility   Outcome Measure  Help needed turning from your back to your side while in a flat bed without using bedrails?: A Lot Help needed moving from lying on your back to sitting on the side of a flat bed without using bedrails?: A Lot Help needed moving to and from a bed to a chair (including a wheelchair)?: A Lot Help needed standing up from a chair using your arms (e.g., wheelchair or bedside chair)?: A Lot Help needed to walk in hospital room?: Total Help needed climbing 3-5 steps with a railing? : Total 6 Click Score: 10    End of Session Equipment Utilized During Treatment: Gait belt Activity Tolerance: Patient limited by pain Patient left: in chair;with chair alarm set;with call bell/phone within reach Nurse Communication: Mobility status PT Visit Diagnosis: Pain;Difficulty in walking, not elsewhere classified (R26.2);Other abnormalities of gait and mobility (R26.89);Muscle weakness (generalized) (M62.81);History of falling (Z91.81);Unsteadiness on feet (R26.81) Pain - Right/Left: Left Pain - part of body: Hip     Time: 5993-5701 PT Time Calculation (min) (ACUTE ONLY):  17 min  Charges:  $Therapeutic Activity: 8-22 mins                      Diana Frost, PT, DPT 05/01/22, 10:31 AM   Diana Frost 05/01/2022, 10:28 AM

## 2022-05-01 NOTE — Plan of Care (Signed)
  Problem: Education: Goal: Verbalization of understanding the information provided (i.e., activity precautions, restrictions, etc) will improve Outcome: Progressing   Problem: Self-Concept: Goal: Ability to maintain and perform role responsibilities to the fullest extent possible will improve Outcome: Progressing   Problem: Pain Management: Goal: Pain level will decrease Outcome: Progressing

## 2022-05-01 NOTE — TOC Progression Note (Addendum)
Transition of Care [facility]) - Progression Note    Patient Details  Name: Diana Frost MRN: [id number] Date of Birth: November 02, 1945  Transition of Care [facility]) CM/SW [place], LCSW Phone Number: 05/01/2022, 9:13 AM  Clinical Narrative:    Called the following SNFS:   *[place] [phone] - No weekend bed availability, could take her Monday.   *[place] [phone] - need to send referral, left another VM for [facility]. *[facility] - Asked to review. Magda Paganini stated patient would be in copay days so would have to pay upfront which daugher is aware of. [phone] for 14 days.  10:12- Called to daughter [name] with update. [name] stated they are not able to pay the copays required upfront for [facility]. [name] stated she would like to proceed with [facility] if we do not hear back from [facility]/if [facility] cannot take her sooner. [name] states she can transport patient to [facility].   Quillian Quince at [facility] confirms patient can come Monday but will need a COVID test on Sunday or Monday prior to coming. They will need DC Summary and orders prior to 12 noon on Monday. Room will be ready around 2pm. Per [facility] Supervisor, family will need to transport. TOC handoff updated.  TOC Supervisor, Quillian Quince at [facility], and MD are aware.  12:10- Attempted another call to [facility] Admissions Worker Mardene Celeste. Per Receptionist, Mardene Celeste is the only person that does Admissions. [phone] is Patricia's direct #. Left another VM for Mardene Celeste.   May throuh July 2-23     Expected Discharge Plan: [place] Barriers to Discharge: SNF Pending bed offer  Expected Discharge Plan and Services   Discharge Planning Services: CM Consult   Living arrangements for the past 2 months: [place] (has been at [facility])                 DME Arranged: N/A                     Social Determinants of Health (SDOH)  Interventions SDOH Screenings   Food Insecurity: No Food Insecurity (04/24/2022)  Housing: Low Risk  (04/24/2022)  Transportation Needs: No Transportation Needs (04/24/2022)  Utilities: Not At Risk (04/24/2022)  Tobacco Use: Medium Risk (04/30/2022)    Readmission Risk Interventions    04/28/2022   10:10 AM  Readmission Risk Prevention Plan  Transportation Screening Complete  Medication Review (RN Care Manager) Referral to Pharmacy  PCP or Specialist appointment within 3-5 days of discharge Complete  HRI or Home Care Consult Complete  SW Recovery Care/Counseling Consult Complete  Palliative Care Screening Not [place] Complete

## 2022-05-01 NOTE — Progress Notes (Signed)
PROGRESS NOTE    Diana Frost  OFB:[id number]  DOB: Apr 11, 1945  DOA: 04/24/2022 PCP: Latanya Maudlin, NP Outpatient Specialists:   Hospital course:  77 year old female with DM2, HTN, CAD s/p recent left femoral neck fracture s/p ORIF 03/29/2022 was readmitted yesterday with another fall again on her left hip and new left proximal femur fracture.  1/22: Hemodynamically stable, s/p IM nail removal by orthopedic surgery.  PT/OT recommending SNF. Concern of some intermittent confusion, daughter asking to check UA as she becomes confused with UTI- Hemoglobin decreased to 6.8-ordered 1 unit of PRBC  1/23: Patient was feeling little more lethargic.  Vital stable.  Labs with improving hemoglobin to 9.9 but did show anion gap metabolic acidosis consistent with DKA with elevated blood glucose level.  Endo tool per DKA protocol ordered but unable to transfer patient to stepdown due to no bed availability.  Anion gap resolved with insulin and IV fluid. Transfer was canceled and patient was restarted on basal and short-acting.  Also giving some extra fluid. Daughter was concerned about restarting insulin pump, stating that is the only way to keep her blood glucose level well-controlled.  Message sent to diabetes coordinator and pharmacy to help. Also starting her on low-dose metoprolol for tachycardia.  1/24: Hemodynamically stable.  CBG improved, acidosis resolved and beta-hydroxybutyrate acid improving.  Awaiting SNF placement.  1/25: Patient remained stable.  Awaiting SNF placement.  1/26: Hemodynamically stable.  Awaiting SNF placement.  Had 1 bed offer for Monday.  Will require a COVID testing on Sunday.   Subjective: Patient was seen and examined today.  Alert and oriented.  Pain seems well-controlled.  No new complaints.  Objective: Vitals:   04/30/22 1512 04/30/22 2058 04/30/22 2321 05/01/22 0812  BP: (!) 101/56 (!) 101/51 111/62 134/60  Pulse: 79 82 83 91  Resp: '18  20 16   '$ Temp: 98.4 F (36.9 C)  98.8 F (37.1 C) 98.4 F (36.9 C)  TempSrc: Oral     SpO2: 99% 97% 99% 98%  Weight:      Height:        Intake/Output Summary (Last 24 hours) at 05/01/2022 1429 Last data filed at 04/30/2022 1802 Gross per 24 hour  Intake 390 ml  Output --  Net 390 ml    Filed Weights   04/24/22 1250  Weight: 49.9 kg     Exam: General.  Frail elderly lady, in no acute distress. Pulmonary.  Lungs clear bilaterally, normal respiratory effort. CV.  Regular rate and rhythm, no JVD, rub or murmur. Abdomen.  Soft, nontender, nondistended, BS positive. CNS.  Alert and oriented .  No focal neurologic deficit. Extremities.  No edema, no cyanosis, pulses intact and symmetrical. Psychiatry.  Judgment and insight appears normal.     Data Reviewed:  Basic Metabolic Panel: Recent Labs  Lab 04/28/22 1612 04/28/22 2009 04/29/22 0026 04/29/22 0452 04/30/22 1056  NA 137 135 137 138 135  K 3.8 4.1 3.4* 3.5 3.3*  CL 106 107 108 106 103  CO2 22 21* '25 24 26  '$ GLUCOSE 76 229* 118* 100* 199*  BUN '16 14 11 9 15  '$ CREATININE 0.65 0.72 0.69 0.57 0.74  CALCIUM 8.0* 7.7* 7.8* 7.8* 7.8*     CBC: Recent Labs  Lab 04/25/22 1156 04/26/22 0456 04/27/22 0420 04/27/22 2132 04/28/22 0936 04/30/22 1056  WBC 13.3* 11.3* 9.8  --  9.9 5.9  HGB 9.1* 7.3* 6.8* 8.6* 9.9* 10.1*  HCT 29.7* 23.1* 21.0* 26.6* 30.5* 30.8*  MCV 103.5* 99.6 98.6  --  94.4 93.3  PLT 271 186 189  --  226 261      Scheduled Meds:  acetaminophen  1,000 mg Oral TID   aspirin EC  81 mg Oral Daily   buPROPion  150 mg Oral q morning   docusate sodium  100 mg Oral BID   enoxaparin (LOVENOX) injection  40 mg Subcutaneous Q24H   ferrous sulfate  325 mg Oral Q breakfast   insulin aspart  0-5 Units Subcutaneous QHS   insulin aspart  0-9 Units Subcutaneous TID WC   insulin glargine-yfgn  5 Units Subcutaneous Daily   metoprolol tartrate  12.5 mg Oral BID   midodrine  5 mg Oral TID with meals   multivitamin  with minerals  1 tablet Oral Daily   rosuvastatin  10 mg Oral QHS   senna-docusate  2 tablet Oral BID   sertraline  100 mg Oral Daily   Vitamin D (Ergocalciferol)  50,000 Units Oral Q7 days   zinc sulfate  220 mg Oral Daily   Continuous Infusions:  methocarbamol (ROBAXIN) IV       Assessment & Plan:   Recurrent left hip fracture Patient is s/p left IM nail removal  Patient states her pain is controlled at rest, get worse with ambulation Further management per orthopedics PT/OT are recommending SNF  DKA.  Resolved. Patient developed anion gap metabolic acidosis with elevated beta hydroxybutyric acid during morning labs.  Initially ordered insulin infusion with Endo tool. Unable to transfer patient to stepdown due to no bed availability. Anion gap resolved with IV fluid and subcu insulin.  CBG roving. Discontinued insulin infusion and patient was restarted back on basal and short-acting. Message sent to diabetes coordinator regarding insulin pump. Giving some more IV fluid as bicarb remained little low although improved than before.  ABLA Hemoglobin improved to 9.9 after getting 1 unit of PRBC yesterday. -Continue iron supplement. Monitor hemoglobin Transfuse if below 7  Delirium Patient with some postop delirium, no evidence of infection, no recent use of IV pain management although she is on p.o. hydrocodone. Daughter is very concerned about UTI, UA was not consistent with UTI, no significant urinary symptoms. -Delirium precautions  AKI Resolved with IV fluid  DM 1 Patient was on an insulin pump at home. -SSI-switched her to with meal and nighttime coverage.  CAD No chest pain. -Restart home aspirin  Depression Continue sertraline and bupropion   DVT prophylaxis: SCD Code Status: DNR   Studies: No results found.  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 Errors have been sought and corrected,but may not always be located. Such creation errors do not reflect on the standard of care.   Lorella Nimrod, [facility]  If 7PM-7AM, please contact night-coverage [email or web address]   LOS: 7 days

## 2022-05-01 NOTE — Progress Notes (Signed)
Occupational Therapy Treatment Patient Details Name: Diana Frost MRN: [id number] DOB: 02-Mar-1946 Today's Date: 05/01/2022   History of present illness Pt is a 77 y/o F admitted on 04/24/22 following a fall. Pt with recent L femoral neck fx & s/p ORIF 03/29/22. Pt now with L proximal femur fx. Pt underwent L IM placement & hardware removal by Dr. Harlow Mares on 04/25/22. PMH: DM1 on insulin pump, CAD, HTN   OT comments  Ms Hunn was seen for OT treatment on this date. Upon arrival to room pt seated in chair, agreeable to tx. Pt requires MIN A + RW sit<>stand x2 from chair. MIN A + RW tooth brushing standing at bed side table. MAX A for LB access sitting. 8/10 pain standing, none at rest, RN in to address. Pt making good progress toward goals, will continue to follow POC. Discharge recommendation remains appropriate.     Recommendations for follow up therapy are one component of a multi-disciplinary discharge planning process, led by the attending physician.  Recommendations may be updated based on patient status, additional functional criteria and insurance authorization.    Follow Up Recommendations  Skilled nursing-short term rehab (<3 hours/day)     Assistance Recommended at Discharge Frequent or constant Supervision/Assistance  Patient can return home with the following  A lot of help with bathing/dressing/bathroom;A lot of help with walking and/or transfers;Assistance with cooking/housework;Assist for transportation;Help with stairs or ramp for entrance;Direct supervision/assist for financial management;Direct supervision/assist for medications management   Equipment Recommendations  Other (comment) (defer)    Recommendations for Other Services      Precautions / Restrictions Precautions Precautions: Fall Restrictions Weight Bearing Restrictions: Yes LLE Weight Bearing: Partial weight bearing LLE Partial Weight Bearing Percentage or Pounds: 50       Mobility Bed Mobility                General bed mobility comments: NT    Transfers Overall transfer level: Needs assistance Equipment used: Rolling walker (2 wheels) Transfers: Sit to/from Stand Sit to Stand: Min assist                 Balance Overall balance assessment: Needs assistance Sitting-balance support: Feet supported, No upper extremity supported Sitting balance-Leahy Scale: Good     Standing balance support: Single extremity supported, During functional activity Standing balance-Leahy Scale: Fair                             ADL either performed or assessed with clinical judgement   ADL Overall ADL's : Needs assistance/impaired                                       General ADL Comments: MIN A tooth brushing standing at bed side table. MAX A for LB access sitting      Cognition Arousal/Alertness: Awake/alert Behavior During Therapy: WFL for tasks assessed/performed Overall Cognitive Status: Within Functional Limits for tasks assessed                                           Pertinent Vitals/ Pain       Pain Assessment Pain Assessment: 0-10 Pain Score: 8  Pain Location: L hip in standing, no pain at rest Pain Descriptors / Indicators:  Discomfort, Grimacing Pain Intervention(s): Limited activity within patient's tolerance, Repositioned, Patient requesting pain meds-RN notified   Frequency  Min 2X/week        Progress Toward Goals  OT Goals(current goals can now be found in the care plan section)  Progress towards OT goals: Progressing toward goals  Acute Rehab OT Goals Patient Stated Goal: to go home OT Goal Formulation: With patient Time For Goal Achievement: 05/10/22 Potential to Achieve Goals: Fair ADL Goals Pt Will Perform Grooming: with modified independence;sitting Pt Will Perform Lower Body Dressing: with modified independence;sit to/from stand Pt Will Transfer to Toilet: with modified  independence;stand pivot transfer;bedside commode Pt Will Perform Toileting - Clothing Manipulation and hygiene: with modified independence;sit to/from stand  Plan Discharge plan remains appropriate;Frequency remains appropriate    Co-evaluation                 AM-PAC OT "6 Clicks" Daily Activity     Outcome Measure   Help from another person eating meals?: None Help from another person taking care of personal grooming?: A Little Help from another person toileting, which includes using toliet, bedpan, or urinal?: A Lot Help from another person bathing (including washing, rinsing, drying)?: A Lot Help from another person to put on and taking off regular upper body clothing?: A Little Help from another person to put on and taking off regular lower body clothing?: A Lot 6 Click Score: 16    End of Session Equipment Utilized During Treatment: Rolling walker (2 wheels)  OT Visit Diagnosis: Pain;Other abnormalities of gait and mobility (R26.89);Muscle weakness (generalized) (M62.81);History of falling (Z91.81) Pain - Right/Left: Left Pain - part of body: Hip   Activity Tolerance Patient tolerated treatment well   Patient Left in chair;with call bell/phone within reach   Nurse Communication          Time: 5638-7564 OT Time Calculation (min): 14 min  Charges: OT General Charges $OT Visit: 1 Visit OT Treatments $Self Care/Home Management : 8-22 mins  Dessie Coma, M.S. OTR/L  05/01/22, 10:08 AM  ascom [phone]

## 2022-05-01 NOTE — Plan of Care (Signed)
Problem: Education: Goal: Verbalization of understanding the information provided (i.e., activity precautions, restrictions, etc) will improve Outcome: Progressing Goal: Individualized Educational Video(s) Outcome: Progressing   Problem: Activity: Goal: Ability to ambulate and perform ADLs will improve Outcome: Progressing   Problem: Clinical Measurements: Goal: Postoperative complications will be avoided or minimized Outcome: Progressing   Problem: Self-Concept: Goal: Ability to maintain and perform role responsibilities to the fullest extent possible will improve Outcome: Progressing   Problem: Pain Management: Goal: Pain level will decrease Outcome: Progressing   Problem: Education: Goal: Ability to describe self-care measures that may prevent or decrease complications (Diabetes Survival Skills Education) will improve Outcome: Progressing Goal: Individualized Educational Video(s) Outcome: Progressing   Problem: Coping: Goal: Ability to adjust to condition or change in health will improve Outcome: Progressing   Problem: Fluid Volume: Goal: Ability to maintain a balanced intake and output will improve Outcome: Progressing   Problem: Health Behavior/Discharge Planning: Goal: Ability to identify and utilize available resources and services will improve Outcome: Progressing Goal: Ability to manage health-related needs will improve Outcome: Progressing   Problem: Metabolic: Goal: Ability to maintain appropriate glucose levels will improve Outcome: Progressing   Problem: Nutritional: Goal: Maintenance of adequate nutrition will improve Outcome: Progressing Goal: Progress toward achieving an optimal weight will improve Outcome: Progressing   Problem: Skin Integrity: Goal: Risk for impaired skin integrity will decrease Outcome: Progressing   Problem: Tissue Perfusion: Goal: Adequacy of tissue perfusion will improve Outcome: Progressing   Problem:  Education: Goal: Knowledge of General Education information will improve Description: Including pain rating scale, medication(s)/side effects and non-pharmacologic comfort measures Outcome: Progressing   Problem: Health Behavior/Discharge Planning: Goal: Ability to manage health-related needs will improve Outcome: Progressing   Problem: Clinical Measurements: Goal: Ability to maintain clinical measurements within normal limits will improve Outcome: Progressing Goal: Will remain free from infection Outcome: Progressing Goal: Diagnostic test results will improve Outcome: Progressing Goal: Respiratory complications will improve Outcome: Progressing Goal: Cardiovascular complication will be avoided Outcome: Progressing   Problem: Activity: Goal: Risk for activity intolerance will decrease Outcome: Progressing   Problem: Nutrition: Goal: Adequate nutrition will be maintained Outcome: Progressing   Problem: Coping: Goal: Level of anxiety will decrease Outcome: Progressing   Problem: Elimination: Goal: Will not experience complications related to bowel motility Outcome: Progressing Goal: Will not experience complications related to urinary retention Outcome: Progressing   Problem: Pain Managment: Goal: General experience of comfort will improve Outcome: Progressing   Problem: Safety: Goal: Ability to remain free from injury will improve Outcome: Progressing   Problem: Skin Integrity: Goal: Risk for impaired skin integrity will decrease Outcome: Progressing   Problem: Education: Goal: Ability to describe self-care measures that may prevent or decrease complications (Diabetes Survival Skills Education) will improve Outcome: Progressing Goal: Individualized Educational Video(s) Outcome: Progressing   Problem: Cardiac: Goal: Ability to maintain an adequate cardiac output will improve Outcome: Progressing   Problem: Health Behavior/Discharge Planning: Goal: Ability  to identify and utilize available resources and services will improve Outcome: Progressing Goal: Ability to manage health-related needs will improve Outcome: Progressing   Problem: Fluid Volume: Goal: Ability to achieve a balanced intake and output will improve Outcome: Progressing   Problem: Metabolic: Goal: Ability to maintain appropriate glucose levels will improve Outcome: Progressing   Problem: Nutritional: Goal: Maintenance of adequate nutrition will improve Outcome: Progressing Goal: Maintenance of adequate weight for body size and type will improve Outcome: Progressing   Problem: Respiratory: Goal: Will regain and/or maintain adequate ventilation Outcome: Progressing  Problem: Urinary Elimination: Goal: Ability to achieve and maintain adequate renal perfusion and functioning will improve Outcome: Progressing

## 2022-05-01 NOTE — Progress Notes (Signed)
Physical Therapy Treatment Patient Details Name: Diana Frost MRN: [id number] DOB: 02-05-1946 Today's Date: 05/01/2022   History of Present Illness Pt is a 77 y/o F admitted on 04/24/22 following a fall. Pt with recent L femoral neck fx & s/p ORIF 03/29/22. Pt now with L proximal femur fx. Pt underwent L IM placement & hardware removal by Dr. Harlow Mares on 04/25/22. PMH: DM1 on insulin pump, CAD, HTN    PT Comments    Pt seen for PT tx with pt agreeable. Pt with decreased recall as she doesn't remember working with this PT earlier. BP checked in sitting in preparation to assess orthostatic vitals but BP noted to be low in chair, pt asymptomatic. Due to low BP in sitting, pt completed squat pivot drop arm recliner>bed with min assist & cuing for technique, assist with pivot. Upon return supine, pt requested to void so provided pt with bed pan 2/2 urgency & low BP. Mishap with bed pan & sheets getting soiled with urine so pt rolled L<>R to allow PT to change linen; pt able to roll with mod I. BP noted to have improved in supine at end of session. (Of note, pt with urinary incontinence upon PT arrival, but notes it occurred this morning & pt sitting in soiled sheets/chuck, to the point they had begun to dry. Educated pt on importance of getting cleaned up.)  BP checked in LUE: Sitting in recliner at beginning of session: 86/55 mmHg MAP 66, HR 83 bpm Semi fowler in bed at end of session: 112/54 mmHg MAP 62, HR 83 bpm    Recommendations for follow up therapy are one component of a multi-disciplinary discharge planning process, led by the attending physician.  Recommendations may be updated based on patient status, additional functional criteria and insurance authorization.  Follow Up Recommendations  Skilled nursing-short term rehab (<3 hours/day) Can patient physically be transported by private vehicle: No   Assistance Recommended at Discharge Frequent or constant Supervision/Assistance  Patient  can return home with the following Direct supervision/assist for medications management;Help with stairs or ramp for entrance;A lot of help with walking and/or transfers;A lot of help with bathing/dressing/bathroom;Assist for transportation;Assistance with cooking/housework;Direct supervision/assist for financial management   Equipment Recommendations  None recommended by PT    Recommendations for Other Services OT consult     Precautions / Restrictions Restrictions Weight Bearing Restrictions: Yes LLE Weight Bearing: Partial weight bearing LLE Partial Weight Bearing Percentage or Pounds: 50     Mobility  Bed Mobility Overal bed mobility: Needs Assistance Bed Mobility: Sit to Supine Rolling: Modified independent (Device/Increase time) (L<>R with bed rails)     Sit to supine: Min assist (to elevate LLE onto bed)   General bed mobility comments: Pt able to scoot to [facility] with mod I with bed rails & bed in [place] position    Transfers Overall transfer level: Needs assistance Equipment used: Rolling walker (2 wheels) Transfers: Bed to chair/wheelchair/BSC Sit to Stand: Max assist, Min assist, Mod assist     Squat pivot transfers: Min assist     General transfer comment: cuing re: hand placement & sequencing, assisting with pivoting    Ambulation/Gait                   Stairs             Wheelchair Mobility    Modified Rankin (Stroke Patients Only)       Balance Overall balance assessment: Needs assistance Sitting-balance support: Feet supported,  No upper extremity supported Sitting balance-Leahy Scale: Fair                                      Cognition Arousal/Alertness: Awake/alert Behavior During Therapy: WFL for tasks assessed/performed Overall Cognitive Status: Within Functional Limits for tasks assessed Area of Impairment: Memory, Safety/judgement, Awareness, Problem solving, Attention, Following commands                      Memory: Decreased short-term memory, Decreased recall of precautions Following Commands: Follows one step commands with increased time Safety/Judgement: Decreased awareness of safety, Decreased awareness of deficits Awareness: Intellectual Problem Solving: Slow processing, Requires verbal cues, Requires tactile cues General Comments: Pt follows simple commands throughout session, does not recall working with therapist in AM.        Exercises General Exercises - Lower Extremity Long Arc Quad: AROM, Strengthening, Left, 10 reps, Seated Heel Slides: AROM, Strengthening, Left, 10 reps Hip ABduction/ADduction: AAROM, Strengthening, Left, 10 reps (hip abduction slides) Straight Leg Raises: AAROM, Strengthening, Left, 10 reps    General Comments        Pertinent Vitals/Pain Pain Assessment Pain Assessment: Faces Faces Pain Scale: Hurts whole lot Pain Location: L hip with some movement/transfers Pain Descriptors / Indicators: Discomfort, Grimacing Pain Intervention(s): Monitored during session, Repositioned    Home Living                          Prior Function            PT Goals (current goals can now be found in the care plan section) Acute Rehab PT Goals Patient Stated Goal: decreased pain PT Goal Formulation: With patient Time For Goal Achievement: 05/10/22 Potential to Achieve Goals: Fair Progress towards PT goals: Progressing toward goals    Frequency    BID      PT Plan Current plan remains appropriate    Co-evaluation              AM-PAC PT "6 Clicks" Mobility   Outcome Measure  Help needed turning from your back to your side while in a flat bed without using bedrails?: A Little Help needed moving from lying on your back to sitting on the side of a flat bed without using bedrails?: A Lot Help needed moving to and from a bed to a chair (including a wheelchair)?: A Lot Help needed standing up from a chair using your arms  (e.g., wheelchair or bedside chair)?: A Lot Help needed to walk in hospital room?: Total Help needed climbing 3-5 steps with a railing? : Total 6 Click Score: 11    End of Session   Activity Tolerance: Treatment limited secondary to medical complications (Comment) Patient left: in bed;with call bell/phone within reach;with bed alarm set;with SCD's reapplied Nurse Communication: Mobility status (BP) PT Visit Diagnosis: Pain;Difficulty in walking, not elsewhere classified (R26.2);Other abnormalities of gait and mobility (R26.89);Muscle weakness (generalized) (M62.81);History of falling (Z91.81);Unsteadiness on feet (R26.81) Pain - Right/Left: Left Pain - part of body: Hip     Time: 8469-6295 PT Time Calculation (min) (ACUTE ONLY): 25 min  Charges:  $Therapeutic Activity: 23-37 mins                     [name], PT, DPT 05/01/22, 3:00 PM   [name] 05/01/2022, 2:57 PM

## 2022-05-02 DIAGNOSIS — F32A Depression, unspecified: Secondary | ICD-10-CM | POA: Diagnosis not present

## 2022-05-02 DIAGNOSIS — S72302K Unspecified fracture of shaft of left femur, subsequent encounter for closed fracture with nonunion: Secondary | ICD-10-CM | POA: Diagnosis not present

## 2022-05-02 DIAGNOSIS — E1059 Type 1 diabetes mellitus with other circulatory complications: Secondary | ICD-10-CM | POA: Diagnosis not present

## 2022-05-02 DIAGNOSIS — I25118 Atherosclerotic heart disease of native coronary artery with other forms of angina pectoris: Secondary | ICD-10-CM | POA: Diagnosis not present

## 2022-05-02 LAB — CREATININE, SERUM
Creatinine, Ser: 0.66 mg/dL (ref 0.44–1.00)
GFR, Estimated: >60 mL/min (ref >60)

## 2022-05-02 LAB — GLUCOSE, CAPILLARY
Glucose-Capillary: 279 mg/dL — ABNORMAL HIGH (ref 70–99)
Glucose-Capillary: 156 mg/dL — ABNORMAL HIGH (ref 70–99)
Glucose-Capillary: 98 mg/dL (ref 70–99)
Glucose-Capillary: 210 mg/dL — ABNORMAL HIGH (ref 70–99)
Glucose-Capillary: 157 mg/dL — ABNORMAL HIGH (ref 70–99)

## 2022-05-02 MED ORDER — SODIUM CHLORIDE 0.9 % IV BOLUS
1000.0000 mL | Freq: Once | INTRAVENOUS | Status: AC
  Administered 2022-05-02: 1000 mL via INTRAVENOUS

## 2022-05-02 NOTE — Progress Notes (Signed)
Physical Therapy Treatment Patient Details Name: Diana Frost MRN: [id number] DOB: Aug 14, 1945 Today's Date: 05/02/2022   History of Present Illness Pt is a 77 y/o F admitted on 04/24/22 following a fall. Pt with recent L femoral neck fx & s/p ORIF 03/29/22. Pt now with L proximal femur fx. Pt underwent L IM placement & hardware removal by Dr. Harlow Mares on 04/25/22. PMH: DM1 on insulin pump, CAD, HTN    PT Comments    Pt was sitting in recliner with daughter at bedside. Pt is lethargic throughout session but was agreeable with encouragement. Overall, cognition is different from previous admission (observed by author) just a few weeks prior. She c/o pain with all movements. Per family," she has not been eating very much." Chief Strategy Officer discussed need with patient to increase intake of food/drinks. Pt was able to stand 2 x but due to drop in BP was unable to progress session further. First trial STS BP dropped to 84/58 (67) 2nd attempt BP reading 61/41 (46). Pt presented with severe dizziness, pale complexation ,and lethargy in standing. MD/RN made aware. BS checked (98), bolus ordered.  Acute PT will continue to progress pt as able per current POC.    Recommendations for follow up therapy are one component of a multi-disciplinary discharge planning process, led by the attending physician.  Recommendations may be updated based on patient status, additional functional criteria and insurance authorization.  Follow Up Recommendations  Skilled nursing-short term rehab (<3 hours/day)     Assistance Recommended at Discharge Frequent or constant Supervision/Assistance  Patient can return home with the following Direct supervision/assist for medications management;Help with stairs or ramp for entrance;A lot of help with walking and/or transfers;A lot of help with bathing/dressing/bathroom;Assist for transportation;Assistance with cooking/housework;Direct supervision/assist for financial management   Equipment  Recommendations  None recommended by PT       Precautions / Restrictions Precautions Precautions: Fall Restrictions Weight Bearing Restrictions: Yes LLE Weight Bearing: Partial weight bearing LLE Partial Weight Bearing Percentage or Pounds: 50     Mobility  Bed Mobility      General bed mobility comments: Pt was in recliner pre session and after. very limited session due to BP concerns/ dizziness/ orthostatic hypotensive    Transfers Overall transfer level: Needs assistance Equipment used: Rolling walker (2 wheels) Transfers: Sit to/from Stand Sit to Stand: Min assist, Mod assist    General transfer comment: pt was able to stand from recliner 2 x but upon standing c/o severe dizziness. BP dropped to 84/58(67) on first attempt however 2nd attempt dropped 62/41 (46). MD made aware, bolus order. Pt's daughter at bedside states she has low BP normally. Discussed that she may be dehydrated from vomiting earlier in the day    Ambulation/Gait  General Gait Details: unsafe to advance to taking steps   Balance Overall balance assessment: Needs assistance Sitting-balance support: Feet supported, No upper extremity supported Sitting balance-Leahy Scale: Fair     Standing balance support: Bilateral upper extremity supported, During functional activity Standing balance-Leahy Scale: Fair       Cognition Arousal/Alertness: Diana Frost During Therapy: WFL for tasks assessed/performed Overall Cognitive Status: Impaired/Different from baseline Area of Impairment: Memory, Safety/judgement, Awareness, Problem solving, Attention, Following commands      Orientation Level: Situation, Time      General Comments: Pt follows simple commands throughout session, does not recall working with therapist in AM.               Pertinent Vitals/Pain Pain Assessment Pain  Assessment: 0-10 Pain Score: 8      PT Goals (current goals can now be found in the care plan section) Acute  Rehab PT Goals Patient Stated Goal: none stated Progress towards PT goals: Not progressing toward goals - comment (medical complications greatly limiting session progression)    Frequency    BID      PT Plan Current plan remains appropriate       AM-PAC PT "6 Clicks" Mobility   Outcome Measure  Help needed turning from your back to your side while in a flat bed without using bedrails?: A Little Help needed moving from lying on your back to sitting on the side of a flat bed without using bedrails?: A Lot Help needed moving to and from a bed to a chair (including a wheelchair)?: A Lot Help needed standing up from a chair using your arms (e.g., wheelchair or bedside chair)?: A Lot Help needed to walk in hospital room?: Total Help needed climbing 3-5 steps with a railing? : Total 6 Click Score: 11    End of Session Equipment Utilized During Treatment: Gait belt Activity Tolerance: Patient limited by fatigue;Patient limited by lethargy;Treatment limited secondary to medical complications (Comment) (limited by low BP concerns) Patient left: in chair;with call bell/phone within reach;with chair alarm set;with family/visitor present;with nursing/sitter in room Nurse Communication: Mobility status Pain - Right/Left: Left Pain - part of body: Hip     Time: 6010-9323 PT Time Calculation (min) (ACUTE ONLY): 17 min  Charges:  $Therapeutic Activity: 8-22 mins                     Julaine Fusi PTA 05/02/22, 2:37 PM

## 2022-05-02 NOTE — Progress Notes (Signed)
PROGRESS NOTE    Diana Frost  HDQ:[id number]  DOB: 1945/06/15  DOA: 04/24/2022 PCP: Latanya Maudlin, NP Outpatient Specialists:   Hospital course:  77 year old female with DM2, HTN, CAD s/p recent left femoral neck fracture s/p ORIF 03/29/2022 was readmitted yesterday with another fall again on her left hip and new left proximal femur fracture.  1/22: Hemodynamically stable, s/p IM nail removal by orthopedic surgery.  PT/OT recommending SNF. Concern of some intermittent confusion, daughter asking to check UA as she becomes confused with UTI- Hemoglobin decreased to 6.8-ordered 1 unit of PRBC  1/23: Patient was feeling little more lethargic.  Vital stable.  Labs with improving hemoglobin to 9.9 but did show anion gap metabolic acidosis consistent with DKA with elevated blood glucose level.  Endo tool per DKA protocol ordered but unable to transfer patient to stepdown due to no bed availability.  Anion gap resolved with insulin and IV fluid. Transfer was canceled and patient was restarted on basal and short-acting.  Also giving some extra fluid. Daughter was concerned about restarting insulin pump, stating that is the only way to keep her blood glucose level well-controlled.  Message sent to diabetes coordinator and pharmacy to help. Also starting her on low-dose metoprolol for tachycardia.  1/24: Hemodynamically stable.  CBG improved, acidosis resolved and beta-hydroxybutyrate acid improving.  Awaiting SNF placement.  1/25: Patient remained stable.  Awaiting SNF placement.  1/26: Hemodynamically stable.  Awaiting SNF placement.  Had 1 bed offer for Monday.  Will require a COVID testing on Sunday.  1/27: Patient was resting comfortably when seen during morning rounds.  Denies any pain.  Later became hypotensive while working with PT.  Giving 1 L of bolus.  CBG within goal.   Subjective: Patient was laying down comfortably when seen today.  Denies any significant  pain.  Objective: Vitals:   05/01/22 1525 05/01/22 2032 05/01/22 2103 05/02/22 0728  BP: 98/64 127/60 (!) 108/52 133/62  Pulse: 84 84 87 96  Resp: '17 20  17  '$ Temp: 97.6 F (36.4 C) 97.8 F (36.6 C)  98.2 F (36.8 C)  TempSrc:    Oral  SpO2: 100% 95% 98% 96%  Weight:      Height:        Intake/Output Summary (Last 24 hours) at 05/02/2022 1533 Last data filed at 05/02/2022 1345 Gross per 24 hour  Intake 600 ml  Output 750 ml  Net -150 ml    Filed Weights   04/24/22 1250  Weight: 49.9 kg     Exam: General.  Frail elderly lady, in no acute distress. Pulmonary.  Lungs clear bilaterally, normal respiratory effort. CV.  Regular rate and rhythm, no JVD, rub or murmur. Abdomen.  Soft, nontender, nondistended, BS positive. CNS.  Alert and oriented .  No focal neurologic deficit. Extremities.  No edema, no cyanosis, pulses intact and symmetrical. Psychiatry.  Judgment and insight appears impaired.   Data Reviewed:  Basic Metabolic Panel: Recent Labs  Lab 04/28/22 1612 04/28/22 2009 04/29/22 0026 04/29/22 0452 04/30/22 1056 05/02/22 0551  NA 137 135 137 138 135  --   K 3.8 4.1 3.4* 3.5 3.3*  --   CL 106 107 108 106 103  --   CO2 22 21* '25 24 26  '$ --   GLUCOSE 76 229* 118* 100* 199*  --   BUN '16 14 11 9 15  '$ --   CREATININE 0.65 0.72 0.69 0.57 0.74 0.66  CALCIUM 8.0* 7.7* 7.8* 7.8* 7.8*  --  CBC: Recent Labs  Lab 04/26/22 0456 04/27/22 0420 04/27/22 2132 04/28/22 0936 04/30/22 1056  WBC 11.3* 9.8  --  9.9 5.9  HGB 7.3* 6.8* 8.6* 9.9* 10.1*  HCT 23.1* 21.0* 26.6* 30.5* 30.8*  MCV 99.6 98.6  --  94.4 93.3  PLT 186 189  --  226 261      Scheduled Meds:  acetaminophen  1,000 mg Oral TID   aspirin EC  81 mg Oral Daily   buPROPion  150 mg Oral q morning   docusate sodium  100 mg Oral BID   enoxaparin (LOVENOX) injection  40 mg Subcutaneous Q24H   ferrous sulfate  325 mg Oral Q breakfast   insulin aspart  0-5 Units Subcutaneous QHS   insulin aspart   0-9 Units Subcutaneous TID WC   insulin glargine-yfgn  5 Units Subcutaneous Daily   metoprolol tartrate  12.5 mg Oral BID   midodrine  5 mg Oral TID with meals   multivitamin with minerals  1 tablet Oral Daily   rosuvastatin  10 mg Oral QHS   senna-docusate  2 tablet Oral BID   sertraline  100 mg Oral Daily   Vitamin D (Ergocalciferol)  50,000 Units Oral Q7 days   zinc sulfate  220 mg Oral Daily   Continuous Infusions:  methocarbamol (ROBAXIN) IV     sodium chloride       Assessment & Plan:   Recurrent left hip fracture Patient is s/p left IM nail removal  Patient states her pain is controlled at rest, get worse with ambulation Further management per orthopedics PT/OT are recommending SNF  DKA.  Resolved. Patient developed anion gap metabolic acidosis with elevated beta hydroxybutyric acid during morning labs.  Initially ordered insulin infusion with Endo tool. Unable to transfer patient to stepdown due to no bed availability. Anion gap resolved with IV fluid and subcu insulin.  CBG roving. Discontinued insulin infusion and patient was restarted back on basal and short-acting. Message sent to diabetes coordinator regarding insulin pump. Giving some more IV fluid as bicarb remained little low although improved than before.  ABLA Hemoglobin improved to 9.9 after getting 1 unit of PRBC yesterday. -Continue iron supplement. Monitor hemoglobin Transfuse if below 7  Delirium Patient with some postop delirium, no evidence of infection, no recent use of IV pain management although she is on p.o. hydrocodone. Daughter is very concerned about UTI, UA was not consistent with UTI, no significant urinary symptoms. -Delirium precautions  AKI Resolved with IV fluid  DM 1 Patient was on an insulin pump at home. -SSI-switched her to with meal and nighttime coverage.  CAD No chest pain. -Restart home aspirin  Depression Continue sertraline and bupropion   DVT prophylaxis:  SCD Code Status: DNR   Studies: No results found.  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 Errors have been sought and corrected,but may not always be located. Such creation errors do not reflect on the standard of care.   Lorella Nimrod, [facility]  If 7PM-7AM, please contact night-coverage [email or web address]   LOS: 8 days

## 2022-05-02 NOTE — Progress Notes (Signed)
Subjective: 7 Days Post-Op Procedure(s) (LRB): INTRAMEDULLARY (IM) NAIL INTERTROCHANTERIC; HARDWARE REMOVAL (Left) Patient is alert and lying comfortably in bed.  Left hip is much less painful and moves better.  Dressing is dry.  Awaiting skilled nursing bed.  Patient reports pain as mild.  Objective:   VITALS:   Vitals:   05/01/22 2103 05/02/22 0728  BP: (!) 108/52 133/62  Pulse: 87 96  Resp:  17  Temp:  98.2 F (36.8 C)  SpO2: 98% 96%    Neurologically intact Incision: dressing C/D/I  LABS Recent Labs    04/30/22 1056  HGB 10.1*  HCT 30.8*  WBC 5.9  PLT 261    Recent Labs    04/30/22 1056 05/02/22 0551  NA 135  --   K 3.3*  --   BUN 15  --   CREATININE 0.74 0.66  GLUCOSE 199*  --     No results for input(s): "LABPT", "INR" in the last 72 hours.   Assessment/Plan: 7 Days Post-Op Procedure(s) (LRB): INTRAMEDULLARY (IM) NAIL INTERTROCHANTERIC; HARDWARE REMOVAL (Left)   Advance diet Up with therapy Discharge to SNF

## 2022-05-02 NOTE — Plan of Care (Signed)
Problem: Education: Goal: Verbalization of understanding the information provided (i.e., activity precautions, restrictions, etc) will improve 05/02/2022 2356 by Francis Dowse, RN Outcome: Progressing 05/02/2022 2339 by Francis Dowse, RN Outcome: Progressing Goal: Individualized Educational Video(s) 05/02/2022 2356 by Francis Dowse, RN Outcome: Progressing 05/02/2022 2339 by Francis Dowse, RN Outcome: Progressing   Problem: Activity: Goal: Ability to ambulate and perform ADLs will improve 05/02/2022 2356 by Francis Dowse, RN Outcome: Progressing 05/02/2022 2339 by Francis Dowse, RN Outcome: Progressing   Problem: Clinical Measurements: Goal: Postoperative complications will be avoided or minimized 05/02/2022 2356 by Francis Dowse, RN Outcome: Progressing 05/02/2022 2339 by Francis Dowse, RN Outcome: Progressing   Problem: Self-Concept: Goal: Ability to maintain and perform role responsibilities to the fullest extent possible will improve 05/02/2022 2356 by Francis Dowse, RN Outcome: Progressing 05/02/2022 2339 by Francis Dowse, RN Outcome: Progressing   Problem: Pain Management: Goal: Pain level will decrease 05/02/2022 2356 by Francis Dowse, RN Outcome: Progressing 05/02/2022 2339 by Francis Dowse, RN Outcome: Progressing   Problem: Education: Goal: Ability to describe self-care measures that may prevent or decrease complications (Diabetes Survival Skills Education) will improve 05/02/2022 2356 by Francis Dowse, RN Outcome: Progressing 05/02/2022 2339 by Francis Dowse, RN Outcome: Progressing Goal: Individualized Educational Video(s) 05/02/2022 2356 by Francis Dowse, RN Outcome: Progressing 05/02/2022 2339 by Francis Dowse, RN Outcome: Progressing   Problem: Coping: Goal: Ability to adjust to condition or change in health will  improve 05/02/2022 2356 by Francis Dowse, RN Outcome: Progressing 05/02/2022 2339 by Francis Dowse, RN Outcome: Progressing   Problem: Fluid Volume: Goal: Ability to maintain a balanced intake and output will improve 05/02/2022 2356 by Francis Dowse, RN Outcome: Progressing 05/02/2022 2339 by Francis Dowse, RN Outcome: Progressing   Problem: Health Behavior/Discharge Planning: Goal: Ability to identify and utilize available resources and services will improve 05/02/2022 2356 by Francis Dowse, RN Outcome: Progressing 05/02/2022 2339 by Francis Dowse, RN Outcome: Progressing Goal: Ability to manage health-related needs will improve 05/02/2022 2356 by Francis Dowse, RN Outcome: Progressing 05/02/2022 2339 by Francis Dowse, RN Outcome: Progressing   Problem: Metabolic: Goal: Ability to maintain appropriate glucose levels will improve 05/02/2022 2356 by Francis Dowse, RN Outcome: Progressing 05/02/2022 2339 by Francis Dowse, RN Outcome: Progressing   Problem: Nutritional: Goal: Maintenance of adequate nutrition will improve 05/02/2022 2356 by Francis Dowse, RN Outcome: Progressing 05/02/2022 2339 by Francis Dowse, RN Outcome: Progressing Goal: Progress toward achieving an optimal weight will improve 05/02/2022 2356 by Francis Dowse, RN Outcome: Progressing 05/02/2022 2339 by Francis Dowse, RN Outcome: Progressing   Problem: Skin Integrity: Goal: Risk for impaired skin integrity will decrease 05/02/2022 2356 by Francis Dowse, RN Outcome: Progressing 05/02/2022 2339 by Francis Dowse, RN Outcome: Progressing   Problem: Tissue Perfusion: Goal: Adequacy of tissue perfusion will improve 05/02/2022 2356 by Francis Dowse, RN Outcome: Progressing 05/02/2022 2339 by Francis Dowse, RN Outcome: Progressing   Problem: Education: Goal:  Knowledge of General Education information will improve Description: Including pain rating scale, medication(s)/side effects and non-pharmacologic comfort measures 05/02/2022 2356 by Francis Dowse, RN Outcome: Progressing 05/02/2022 2339 by Francis Dowse, RN Outcome: Progressing   Problem: Health Behavior/Discharge Planning: Goal: Ability to manage health-related needs will improve 05/02/2022 2356 by Francis Dowse, RN Outcome: Progressing 05/02/2022 2339 by Francis Dowse, RN Outcome: Progressing  Problem: Clinical Measurements: Goal: Ability to maintain clinical measurements within normal limits will improve 05/02/2022 2356 by Francis Dowse, RN Outcome: Progressing 05/02/2022 2339 by Francis Dowse, RN Outcome: Progressing Goal: Will remain free from infection 05/02/2022 2356 by Francis Dowse, RN Outcome: Progressing 05/02/2022 2339 by Francis Dowse, RN Outcome: Progressing Goal: Diagnostic test results will improve 05/02/2022 2356 by Francis Dowse, RN Outcome: Progressing 05/02/2022 2339 by Francis Dowse, RN Outcome: Progressing Goal: Respiratory complications will improve 05/02/2022 2356 by Francis Dowse, RN Outcome: Progressing 05/02/2022 2339 by Francis Dowse, RN Outcome: Progressing Goal: Cardiovascular complication will be avoided 05/02/2022 2356 by Francis Dowse, RN Outcome: Progressing 05/02/2022 2339 by Francis Dowse, RN Outcome: Progressing   Problem: Activity: Goal: Risk for activity intolerance will decrease 05/02/2022 2356 by Francis Dowse, RN Outcome: Progressing 05/02/2022 2339 by Francis Dowse, RN Outcome: Progressing   Problem: Nutrition: Goal: Adequate nutrition will be maintained 05/02/2022 2356 by Francis Dowse, RN Outcome: Progressing 05/02/2022 2339 by Francis Dowse, RN Outcome: Progressing    Problem: Coping: Goal: Level of anxiety will decrease 05/02/2022 2356 by Francis Dowse, RN Outcome: Progressing 05/02/2022 2339 by Francis Dowse, RN Outcome: Progressing   Problem: Elimination: Goal: Will not experience complications related to bowel motility 05/02/2022 2356 by Francis Dowse, RN Outcome: Progressing 05/02/2022 2339 by Francis Dowse, RN Outcome: Progressing Goal: Will not experience complications related to urinary retention 05/02/2022 2356 by Francis Dowse, RN Outcome: Progressing 05/02/2022 2339 by Francis Dowse, RN Outcome: Progressing   Problem: Pain Managment: Goal: General experience of comfort will improve 05/02/2022 2356 by Francis Dowse, RN Outcome: Progressing 05/02/2022 2339 by Francis Dowse, RN Outcome: Progressing   Problem: Safety: Goal: Ability to remain free from injury will improve 05/02/2022 2356 by Francis Dowse, RN Outcome: Progressing 05/02/2022 2339 by Francis Dowse, RN Outcome: Progressing   Problem: Skin Integrity: Goal: Risk for impaired skin integrity will decrease 05/02/2022 2356 by Francis Dowse, RN Outcome: Progressing 05/02/2022 2339 by Francis Dowse, RN Outcome: Progressing   Problem: Education: Goal: Ability to describe self-care measures that may prevent or decrease complications (Diabetes Survival Skills Education) will improve 05/02/2022 2356 by Francis Dowse, RN Outcome: Progressing 05/02/2022 2339 by Francis Dowse, RN Outcome: Progressing Goal: Individualized Educational Video(s) 05/02/2022 2356 by Francis Dowse, RN Outcome: Progressing 05/02/2022 2339 by Francis Dowse, RN Outcome: Progressing   Problem: Cardiac: Goal: Ability to maintain an adequate cardiac output will improve 05/02/2022 2356 by Francis Dowse, RN Outcome: Progressing 05/02/2022 2339 by Francis Dowse, RN Outcome: Progressing   Problem: Health Behavior/Discharge Planning: Goal: Ability to identify and utilize available resources and services will improve 05/02/2022 2356 by Francis Dowse, RN Outcome: Progressing 05/02/2022 2339 by Francis Dowse, RN Outcome: Progressing Goal: Ability to manage health-related needs will improve 05/02/2022 2356 by Francis Dowse, RN Outcome: Progressing 05/02/2022 2339 by Francis Dowse, RN Outcome: Progressing   Problem: Fluid Volume: Goal: Ability to achieve a balanced intake and output will improve 05/02/2022 2356 by Francis Dowse, RN Outcome: Progressing 05/02/2022 2339 by Francis Dowse, RN Outcome: Progressing   Problem: Metabolic: Goal: Ability to maintain appropriate glucose levels will improve 05/02/2022 2356 by Francis Dowse, RN Outcome: Progressing 05/02/2022 2339 by Francis Dowse, RN Outcome: Progressing   Problem: Nutritional: Goal: Maintenance of adequate nutrition will improve 05/02/2022 2356 by Oleh Genin,  Bridgett Larsson, RN Outcome: Progressing 05/02/2022 2339 by Francis Dowse, RN Outcome: Progressing Goal: Maintenance of adequate weight for body size and type will improve 05/02/2022 2356 by Francis Dowse, RN Outcome: Progressing 05/02/2022 2339 by Francis Dowse, RN Outcome: Progressing

## 2022-05-02 NOTE — Plan of Care (Signed)
  Problem: Education: Goal: Verbalization of understanding the information provided (i.e., activity precautions, restrictions, etc) will improve Outcome: Progressing Goal: Individualized Educational Video(s) Outcome: Progressing   Problem: Activity: Goal: Ability to ambulate and perform ADLs will improve Outcome: Progressing   Problem: Clinical Measurements: Goal: Postoperative complications will be avoided or minimized Outcome: Progressing   Problem: Self-Concept: Goal: Ability to maintain and perform role responsibilities to the fullest extent possible will improve Outcome: Progressing   Problem: Pain Management: Goal: Pain level will decrease Outcome: Progressing   Problem: Education: Goal: Ability to describe self-care measures that may prevent or decrease complications (Diabetes Survival Skills Education) will improve Outcome: Progressing Goal: Individualized Educational Video(s) Outcome: Progressing   Problem: Coping: Goal: Ability to adjust to condition or change in health will improve Outcome: Progressing   Problem: Fluid Volume: Goal: Ability to maintain a balanced intake and output will improve Outcome: Progressing   Problem: Health Behavior/Discharge Planning: Goal: Ability to identify and utilize available resources and services will improve Outcome: Progressing Goal: Ability to manage health-related needs will improve Outcome: Progressing   Problem: Metabolic: Goal: Ability to maintain appropriate glucose levels will improve Outcome: Progressing   Problem: Nutritional: Goal: Maintenance of adequate nutrition will improve Outcome: Progressing Goal: Progress toward achieving an optimal weight will improve Outcome: Progressing   Problem: Skin Integrity: Goal: Risk for impaired skin integrity will decrease Outcome: Progressing   Problem: Tissue Perfusion: Goal: Adequacy of tissue perfusion will improve Outcome: Progressing   Problem:  Education: Goal: Knowledge of General Education information will improve Description: Including pain rating scale, medication(s)/side effects and non-pharmacologic comfort measures Outcome: Progressing   Problem: Health Behavior/Discharge Planning: Goal: Ability to manage health-related needs will improve Outcome: Progressing   Problem: Clinical Measurements: Goal: Ability to maintain clinical measurements within normal limits will improve Outcome: Progressing Goal: Will remain free from infection Outcome: Progressing Goal: Diagnostic test results will improve Outcome: Progressing Goal: Respiratory complications will improve Outcome: Progressing Goal: Cardiovascular complication will be avoided Outcome: Progressing   Problem: Activity: Goal: Risk for activity intolerance will decrease Outcome: Progressing   Problem: Nutrition: Goal: Adequate nutrition will be maintained Outcome: Progressing   Problem: Coping: Goal: Level of anxiety will decrease Outcome: Progressing   Problem: Elimination: Goal: Will not experience complications related to bowel motility Outcome: Progressing Goal: Will not experience complications related to urinary retention Outcome: Progressing   Problem: Pain Managment: Goal: General experience of comfort will improve Outcome: Progressing   Problem: Safety: Goal: Ability to remain free from injury will improve Outcome: Progressing   Problem: Skin Integrity: Goal: Risk for impaired skin integrity will decrease Outcome: Progressing   Problem: Education: Goal: Ability to describe self-care measures that may prevent or decrease complications (Diabetes Survival Skills Education) will improve Outcome: Progressing Goal: Individualized Educational Video(s) Outcome: Progressing   Problem: Cardiac: Goal: Ability to maintain an adequate cardiac output will improve Outcome: Progressing   Problem: Health Behavior/Discharge Planning: Goal: Ability  to identify and utilize available resources and services will improve Outcome: Progressing Goal: Ability to manage health-related needs will improve Outcome: Progressing   Problem: Fluid Volume: Goal: Ability to achieve a balanced intake and output will improve Outcome: Progressing   Problem: Metabolic: Goal: Ability to maintain appropriate glucose levels will improve Outcome: Progressing   Problem: Nutritional: Goal: Maintenance of adequate nutrition will improve Outcome: Progressing Goal: Maintenance of adequate weight for body size and type will improve Outcome: Progressing

## 2022-05-02 NOTE — Plan of Care (Signed)
Problem: Education: Goal: Verbalization of understanding the information provided (i.e., activity precautions, restrictions, etc) will improve 05/02/2022 1846 by Kerney Elbe, LPN Outcome: Progressing 05/02/2022 1845 by Kerney Elbe, LPN Outcome: Progressing   Problem: Activity: Goal: Ability to ambulate and perform ADLs will improve 05/02/2022 1846 by Kerney Elbe, LPN Outcome: Progressing 05/02/2022 1845 by Kerney Elbe, LPN Outcome: Progressing   Problem: Clinical Measurements: Goal: Postoperative complications will be avoided or minimized 05/02/2022 1846 by Kerney Elbe, LPN Outcome: Progressing 05/02/2022 1845 by Kerney Elbe, LPN Outcome: Progressing   Problem: Self-Concept: Goal: Ability to maintain and perform role responsibilities to the fullest extent possible will improve 05/02/2022 1846 by Kerney Elbe, LPN Outcome: Progressing 05/02/2022 1845 by Kerney Elbe, LPN Outcome: Progressing   Problem: Pain Management: Goal: Pain level will decrease 05/02/2022 1846 by Kerney Elbe, LPN Outcome: Progressing 05/02/2022 1845 by Kerney Elbe, LPN Outcome: Progressing   Problem: Education: Goal: Ability to describe self-care measures that may prevent or decrease complications (Diabetes Survival Skills Education) will improve 05/02/2022 1846 by Kerney Elbe, LPN Outcome: Progressing 05/02/2022 1845 by Kerney Elbe, LPN Outcome: Progressing Goal: Individualized Educational Video(s) Outcome: Progressing   Problem: Coping: Goal: Ability to adjust to condition or change in health will improve 05/02/2022 1846 by Kerney Elbe, LPN Outcome: Progressing 05/02/2022 1845 by Kerney Elbe, LPN Outcome: Progressing   Problem: Fluid Volume: Goal: Ability to maintain a balanced intake and output will improve 05/02/2022 1846 by Kerney Elbe, LPN Outcome: Progressing 05/02/2022 1845 by Kerney Elbe,  LPN Outcome: Progressing   Problem: Health Behavior/Discharge Planning: Goal: Ability to identify and utilize available resources and services will improve 05/02/2022 1846 by Kerney Elbe, LPN Outcome: Progressing 05/02/2022 1845 by Kerney Elbe, LPN Outcome: Progressing Goal: Ability to manage health-related needs will improve 05/02/2022 1846 by Kerney Elbe, LPN Outcome: Progressing 05/02/2022 1845 by Kerney Elbe, LPN Outcome: Progressing   Problem: Metabolic: Goal: Ability to maintain appropriate glucose levels will improve 05/02/2022 1846 by Kerney Elbe, LPN Outcome: Progressing 05/02/2022 1845 by Kerney Elbe, LPN Outcome: Progressing   Problem: Nutritional: Goal: Maintenance of adequate nutrition will improve 05/02/2022 1846 by Kerney Elbe, LPN Outcome: Progressing 05/02/2022 1845 by Kerney Elbe, LPN Outcome: Progressing Goal: Progress toward achieving an optimal weight will improve 05/02/2022 1846 by Kerney Elbe, LPN Outcome: Progressing 05/02/2022 1845 by Kerney Elbe, LPN Outcome: Progressing   Problem: Skin Integrity: Goal: Risk for impaired skin integrity will decrease 05/02/2022 1846 by Kerney Elbe, LPN Outcome: Progressing 05/02/2022 1845 by Kerney Elbe, LPN Outcome: Progressing   Problem: Tissue Perfusion: Goal: Adequacy of tissue perfusion will improve Outcome: Progressing   Problem: Education: Goal: Knowledge of General Education information will improve Description: Including pain rating scale, medication(s)/side effects and non-pharmacologic comfort measures Outcome: Progressing   Problem: Health Behavior/Discharge Planning: Goal: Ability to manage health-related needs will improve Outcome: Progressing   Problem: Clinical Measurements: Goal: Ability to maintain clinical measurements within normal limits will improve Outcome: Progressing Goal: Will remain free from  infection Outcome: Progressing Goal: Diagnostic test results will improve Outcome: Progressing Goal: Respiratory complications will improve Outcome: Progressing Goal: Cardiovascular complication will be avoided Outcome: Progressing   Problem: Activity: Goal: Risk for activity intolerance will decrease Outcome: Progressing   Problem: Nutrition: Goal: Adequate nutrition will be maintained Outcome: Progressing   Problem: Coping: Goal: Level of anxiety will decrease Outcome: Progressing  Problem: Elimination: ?Goal: Will not experience complications related to bowel motility ?Outcome: Progressing ?Goal: Will not experience complications related to urinary retention ?Outcome: Progressing ?  ?Problem: Pain Managment: ?Goal: General experience of comfort will improve ?Outcome: Progressing ?  ?Problem: Safety: ?Goal: Ability to remain free from injury will improve ?Outcome: Progressing ?  ?Problem: Skin Integrity: ?Goal: Risk for impaired skin integrity will decrease ?Outcome: Progressing ?  ?

## 2022-05-02 NOTE — Progress Notes (Signed)
PT Cancellation Note  Patient Details Name: Diana Frost MRN: [id number] DOB: 11/03/1945   Cancelled Treatment:     PT attempt. Pt agreeable to session however once she starts moving towards EOB pt becomes nauseous and starts vomiting. Issued wash cloths, emesis bag and made RN staff aware. PT will return at a later time when more appropriate to participate.     Willette Pa 05/02/2022, 9:08 AM

## 2022-05-03 DIAGNOSIS — S72302K Unspecified fracture of shaft of left femur, subsequent encounter for closed fracture with nonunion: Secondary | ICD-10-CM | POA: Diagnosis not present

## 2022-05-03 DIAGNOSIS — I25118 Atherosclerotic heart disease of native coronary artery with other forms of angina pectoris: Secondary | ICD-10-CM | POA: Diagnosis not present

## 2022-05-03 DIAGNOSIS — E1059 Type 1 diabetes mellitus with other circulatory complications: Secondary | ICD-10-CM | POA: Diagnosis not present

## 2022-05-03 DIAGNOSIS — F32A Depression, unspecified: Secondary | ICD-10-CM | POA: Diagnosis not present

## 2022-05-03 LAB — RESP PANEL BY RT-PCR (RSV, FLU A&B, COVID)  RVPGX2
Influenza A by PCR: NEGATIVE
Influenza B by PCR: NEGATIVE
Resp Syncytial Virus by PCR: NEGATIVE
SARS Coronavirus 2 by RT PCR: NEGATIVE

## 2022-05-03 LAB — GLUCOSE, CAPILLARY
Glucose-Capillary: 255 mg/dL — ABNORMAL HIGH (ref 70–99)
Glucose-Capillary: 108 mg/dL — ABNORMAL HIGH (ref 70–99)
Glucose-Capillary: 112 mg/dL — ABNORMAL HIGH (ref 70–99)
Glucose-Capillary: 149 mg/dL — ABNORMAL HIGH (ref 70–99)
Glucose-Capillary: 132 mg/dL — ABNORMAL HIGH (ref 70–99)

## 2022-05-03 MED ORDER — INSULIN GLARGINE-YFGN 100 UNIT/ML ~~LOC~~ SOLN
4.0000 [IU] | Freq: Two times a day (BID) | SUBCUTANEOUS | Status: DC
  Administered 2022-05-03 – 2022-05-04 (×3): 4 [IU] via SUBCUTANEOUS
  Filled 2022-05-03 (×4): qty 0.04

## 2022-05-03 NOTE — Progress Notes (Signed)
Subjective: 8 Days Post-Op Procedure(s) (LRB): INTRAMEDULLARY (IM) NAIL INTERTROCHANTERIC; HARDWARE REMOVAL (Left) Patient is doing well.  She is lying quietly in bed and is alert.  Pain is to decrease significantly.  Vital signs are normal.  She is awaiting skilled nursing placement.  Patient reports pain as mild.  Objective:   VITALS:   Vitals:   05/03/22 0446 05/03/22 0757  BP: 123/61 132/67  Pulse: 87 93  Resp: 17 18  Temp: 98 F (36.7 C) 99.1 F (37.3 C)  SpO2: 95% 99%    Neurologically intact Incision: dressing C/D/I  LABS Recent Labs    04/30/22 1056  HGB 10.1*  HCT 30.8*  WBC 5.9  PLT 261    Recent Labs    04/30/22 1056 05/02/22 0551  NA 135  --   K 3.3*  --   BUN 15  --   CREATININE 0.74 0.66  GLUCOSE 199*  --     No results for input(s): "LABPT", "INR" in the last 72 hours.   Assessment/Plan: 8 Days Post-Op Procedure(s) (LRB): INTRAMEDULLARY (IM) NAIL INTERTROCHANTERIC; HARDWARE REMOVAL (Left)   Up with therapy Discharge to SNF

## 2022-05-03 NOTE — Progress Notes (Signed)
PROGRESS NOTE    Diana Frost  FAO:[id number]  DOB: Apr 25, 1945  DOA: 04/24/2022 PCP: Latanya Maudlin, NP Outpatient Specialists:   Hospital course:  77 year old female with DM2, HTN, CAD s/p recent left femoral neck fracture s/p ORIF 03/29/2022 was readmitted yesterday with another fall again on her left hip and new left proximal femur fracture.  1/22: Hemodynamically stable, s/p IM nail removal by orthopedic surgery.  PT/OT recommending SNF. Concern of some intermittent confusion, daughter asking to check UA as she becomes confused with UTI- Hemoglobin decreased to 6.8-ordered 1 unit of PRBC  1/23: Patient was feeling little more lethargic.  Vital stable.  Labs with improving hemoglobin to 9.9 but did show anion gap metabolic acidosis consistent with DKA with elevated blood glucose level.  Endo tool per DKA protocol ordered but unable to transfer patient to stepdown due to no bed availability.  Anion gap resolved with insulin and IV fluid. Transfer was canceled and patient was restarted on basal and short-acting.  Also giving some extra fluid. Daughter was concerned about restarting insulin pump, stating that is the only way to keep her blood glucose level well-controlled.  Message sent to diabetes coordinator and pharmacy to help. Also starting her on low-dose metoprolol for tachycardia.  1/24: Hemodynamically stable.  CBG improved, acidosis resolved and beta-hydroxybutyrate acid improving.  Awaiting SNF placement.  1/25: Patient remained stable.  Awaiting SNF placement.  1/26: Hemodynamically stable.  Awaiting SNF placement.  Had 1 bed offer for Monday.  Will require a COVID testing on Sunday.  1/27: Patient was resting comfortably when seen during morning rounds.  Denies any pain. Developed some hypoxia later on while working with PT requiring a bolus.  1/28: Clinically improving.  CBG elevated this morning, changing Semglee to 40 units twice daily.  Will order COVID  and flu test today so she can go to facility tomorrow   Subjective: Patient was lying comfortably when seen today.  Pain seems well-controlled..  Objective: Vitals:   05/02/22 1555 05/02/22 2126 05/03/22 0446 05/03/22 0757  BP: (!) 91/52 (!) 109/59 123/61 132/67  Pulse: 85 87 87 93  Resp: '16 16 17 18  '$ Temp: 98 F (36.7 C) 98.6 F (37 C) 98 F (36.7 C) 99.1 F (37.3 C)  TempSrc:  Oral    SpO2: 98% 96% 95% 99%  Weight:      Height:        Intake/Output Summary (Last 24 hours) at 05/03/2022 1457 Last data filed at 05/03/2022 1314 Gross per 24 hour  Intake --  Output 1900 ml  Net -1900 ml    Filed Weights   04/24/22 1250  Weight: 49.9 kg    Exam: General.  Frail elderly lady, in no acute distress. Pulmonary.  Lungs clear bilaterally, normal respiratory effort. CV.  Regular rate and rhythm, no JVD, rub or murmur. Abdomen.  Soft, nontender, nondistended, BS positive. CNS.  Alert and oriented .  No focal neurologic deficit. Extremities.  No edema, no cyanosis, pulses intact and symmetrical. Psychiatry.  Appears to have some cognitive impairment.  Data Reviewed:  Basic Metabolic Panel: Recent Labs  Lab 04/28/22 1612 04/28/22 2009 04/29/22 0026 04/29/22 0452 04/30/22 1056 05/02/22 0551  NA 137 135 137 138 135  --   K 3.8 4.1 3.4* 3.5 3.3*  --   CL 106 107 108 106 103  --   CO2 22 21* '25 24 26  '$ --   GLUCOSE 76 229* 118* 100* 199*  --  BUN '16 14 11 9 15  '$ --   CREATININE 0.65 0.72 0.69 0.57 0.74 0.66  CALCIUM 8.0* 7.7* 7.8* 7.8* 7.8*  --      CBC: Recent Labs  Lab 04/27/22 0420 04/27/22 2132 04/28/22 0936 04/30/22 1056  WBC 9.8  --  9.9 5.9  HGB 6.8* 8.6* 9.9* 10.1*  HCT 21.0* 26.6* 30.5* 30.8*  MCV 98.6  --  94.4 93.3  PLT 189  --  226 261      Scheduled Meds:  acetaminophen  1,000 mg Oral TID   aspirin EC  81 mg Oral Daily   buPROPion  150 mg Oral q morning   docusate sodium  100 mg Oral BID   enoxaparin (LOVENOX) injection  40 mg  Subcutaneous Q24H   ferrous sulfate  325 mg Oral Q breakfast   insulin aspart  0-5 Units Subcutaneous QHS   insulin aspart  0-9 Units Subcutaneous TID WC   insulin glargine-yfgn  4 Units Subcutaneous BID   metoprolol tartrate  12.5 mg Oral BID   midodrine  5 mg Oral TID with meals   multivitamin with minerals  1 tablet Oral Daily   rosuvastatin  10 mg Oral QHS   senna-docusate  2 tablet Oral BID   sertraline  100 mg Oral Daily   Vitamin D (Ergocalciferol)  50,000 Units Oral Q7 days   zinc sulfate  220 mg Oral Daily   Continuous Infusions:  methocarbamol (ROBAXIN) IV       Assessment & Plan:   Recurrent left hip fracture Patient is s/p left IM nail removal  Patient states her pain is controlled at rest, get worse with ambulation Further management per orthopedics PT/OT are recommending SNF  DKA.  Resolved. Patient developed anion gap metabolic acidosis with elevated beta hydroxybutyric acid during morning labs.  Initially ordered insulin infusion with Endo tool. Unable to transfer patient to stepdown due to no bed availability. Anion gap resolved with IV fluid and subcu insulin.  CBG roving. Discontinued insulin infusion and patient was restarted back on basal and short-acting. Message sent to diabetes coordinator regarding insulin pump. Giving some more IV fluid as bicarb remained little low although improved than before.  ABLA Hemoglobin improved to 9.9 after getting 1 unit of PRBC yesterday. -Continue iron supplement. Monitor hemoglobin Transfuse if below 7  Delirium Patient with some postop delirium, no evidence of infection, no recent use of IV pain management although she is on p.o. hydrocodone. Daughter is very concerned about UTI, UA was not consistent with UTI, no significant urinary symptoms. -Delirium precautions  AKI Resolved with IV fluid  DM 1 Patient was on an insulin pump at home. -SSI-switched her to with meal and nighttime coverage. -Change semglee  to 4 units BID.  CAD No chest pain. -Restart home aspirin  Depression Continue sertraline and bupropion   DVT prophylaxis: SCD Code Status: DNR   Studies: No results found.  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 Errors have been sought and corrected,but may not always be located. Such creation errors do not reflect on the standard of care.   Lorella Nimrod, [facility]  If 7PM-7AM, please contact night-coverage [email or web address]   LOS: 9 days

## 2022-05-03 NOTE — Progress Notes (Addendum)
Physical Therapy Treatment Patient Details Name: Diana Frost MRN: [id number] DOB: February 28, 1946 Today's Date: 05/03/2022   History of Present Illness Pt is a 77 y/o F admitted on 04/24/22 following a fall. Pt with recent L femoral neck fx & s/p ORIF 03/29/22. Pt now with L proximal femur fx. Pt underwent L IM placement & hardware removal by Dr. Harlow Mares on 04/25/22. PMH: DM1 on insulin pump, CAD, HTN    PT Comments    Pt in chair earlier this am and ready to go back to bed.  She denies dizziness (confirmed by tech no c/o when getting OOB today) and stated she overall feels better.  Seated AROM x 10. She stands to RW with min a x 1 and quickly c/o dizziness.  She sits back down and stand pivot to chair for safety with min a x 1.  She initiates laying down and needs no assist to get LE's back into bed but does need cues to reposition.  Reports feeling better in supine.  Supine AAROM x 10 LLE.  Pt initially seems orientated but during session she told me that she can't believe her home is now and extension of [facility].  "That building over there was our swimming pool."  When asked her address and she gives me an address in [place] (not here).  Chart states address in [place].  She tells me details about her home and that the hospital converted her home where she is now.  She does state her daughter "tells me I am crazy" but that she knows it to be true.    Will plan on orthostatics tomorrow if appropriate.  She was only able to stand briefly today and unable to stand today for 3 minutes so it was deferred.   Recommendations for follow up therapy are one component of a multi-disciplinary discharge planning process, led by the attending physician.  Recommendations may be updated based on patient status, additional functional criteria and insurance authorization.  Follow Up Recommendations  Skilled nursing-short term rehab (<3 hours/day)     Assistance Recommended at Discharge Frequent or constant  Supervision/Assistance  Patient can return home with the following Direct supervision/assist for medications management;Help with stairs or ramp for entrance;A lot of help with walking and/or transfers;A lot of help with bathing/dressing/bathroom;Assist for transportation;Assistance with cooking/housework;Direct supervision/assist for financial management   Equipment Recommendations  None recommended by PT    Recommendations for Other Services       Precautions / Restrictions Precautions Precautions: Fall Restrictions Weight Bearing Restrictions: Yes LLE Weight Bearing: Partial weight bearing LLE Partial Weight Bearing Percentage or Pounds: 50     Mobility  Bed Mobility Overal bed mobility: Needs Assistance Bed Mobility: Sit to Supine       Sit to supine: Supervision        Transfers Overall transfer level: Needs assistance Equipment used: Rolling walker (2 wheels) Transfers: Sit to/from Stand Sit to Stand: Min assist Stand pivot transfers: Min assist              Ambulation/Gait                   Stairs             Wheelchair Mobility    Modified Rankin (Stroke Patients Only)       Balance Overall balance assessment: Needs assistance Sitting-balance support: Feet supported, No upper extremity supported Sitting balance-Leahy Scale: Good     Standing balance support: Bilateral upper extremity supported, During  functional activity Standing balance-Leahy Scale: Fair                              Cognition Arousal/Alertness: Awake/alert Behavior During Therapy: WFL for tasks assessed/performed Overall Cognitive Status: Impaired/Different from baseline                                 General Comments: initially seems intact but with time deficits appear.  stated "I can't believe [facility] is an extension of my home"  " That building there used to be our swimming pool "  She did state her daughter "thinks I'm crazy,  but I know this is my house".        Exercises Other Exercises Other Exercises: seated AROM    General Comments        Pertinent Vitals/Pain Pain Assessment Pain Assessment: Faces Faces Pain Scale: Hurts little more Pain Location: denies pain at rest but inreased with mobility Pain Descriptors / Indicators: Discomfort, Grimacing, Sore Pain Intervention(s): Limited activity within patient's tolerance, Monitored during session, Repositioned    Home Living                          Prior Function            PT Goals (current goals can now be found in the care plan section) Progress towards PT goals: Not progressing toward goals - comment    Frequency    BID      PT Plan Current plan remains appropriate    Co-evaluation              AM-PAC PT "6 Clicks" Mobility   Outcome Measure  Help needed turning from your back to your side while in a flat bed without using bedrails?: A Little Help needed moving from lying on your back to sitting on the side of a flat bed without using bedrails?: A Little Help needed moving to and from a bed to a chair (including a wheelchair)?: A Lot Help needed standing up from a chair using your arms (e.g., wheelchair or bedside chair)?: A Little Help needed to walk in hospital room?: Total Help needed climbing 3-5 steps with a railing? : Total 6 Click Score: 13    End of Session Equipment Utilized During Treatment: Gait belt Activity Tolerance: Patient limited by fatigue Patient left: in bed;with call bell/phone within reach;with bed alarm set Nurse Communication: Mobility status PT Visit Diagnosis: Pain;Difficulty in walking, not elsewhere classified (R26.2);Other abnormalities of gait and mobility (R26.89);Muscle weakness (generalized) (M62.81);History of falling (Z91.81);Unsteadiness on feet (R26.81) Pain - Right/Left: Left Pain - part of body: Hip     Time: 3532-9924 PT Time Calculation (min) (ACUTE ONLY): 10  min  Charges:  $Therapeutic Activity: 8-22 mins                   Chesley Noon, PTA 05/03/22, 10:34 AM

## 2022-05-03 NOTE — Plan of Care (Signed)
  Problem: Education: Goal: Verbalization of understanding the information provided (i.e., activity precautions, restrictions, etc) will improve Outcome: Progressing   Problem: Activity: Goal: Ability to ambulate and perform ADLs will improve Outcome: Progressing   Problem: Clinical Measurements: Goal: Postoperative complications will be avoided or minimized Outcome: Progressing   Problem: Self-Concept: Goal: Ability to maintain and perform role responsibilities to the fullest extent possible will improve Outcome: Progressing   Problem: Pain Management: Goal: Pain level will decrease Outcome: Progressing   Problem: Education: Goal: Ability to describe self-care measures that may prevent or decrease complications (Diabetes Survival Skills Education) will improve Outcome: Progressing Goal: Individualized Educational Video(s) Outcome: Progressing   Problem: Coping: Goal: Ability to adjust to condition or change in health will improve Outcome: Progressing   Problem: Fluid Volume: Goal: Ability to maintain a balanced intake and output will improve Outcome: Progressing   Problem: Health Behavior/Discharge Planning: Goal: Ability to identify and utilize available resources and services will improve Outcome: Progressing Goal: Ability to manage health-related needs will improve Outcome: Progressing   Problem: Metabolic: Goal: Ability to maintain appropriate glucose levels will improve Outcome: Progressing   Problem: Nutritional: Goal: Maintenance of adequate nutrition will improve Outcome: Progressing Goal: Progress toward achieving an optimal weight will improve Outcome: Progressing   Problem: Skin Integrity: Goal: Risk for impaired skin integrity will decrease Outcome: Progressing   Problem: Tissue Perfusion: Goal: Adequacy of tissue perfusion will improve Outcome: Progressing   Problem: Education: Goal: Knowledge of General Education information will  improve Description: Including pain rating scale, medication(s)/side effects and non-pharmacologic comfort measures Outcome: Progressing   Problem: Health Behavior/Discharge Planning: Goal: Ability to manage health-related needs will improve Outcome: Progressing   Problem: Clinical Measurements: Goal: Ability to maintain clinical measurements within normal limits will improve Outcome: Progressing Goal: Will remain free from infection Outcome: Progressing Goal: Diagnostic test results will improve Outcome: Progressing Goal: Respiratory complications will improve Outcome: Progressing Goal: Cardiovascular complication will be avoided Outcome: Progressing   Problem: Activity: Goal: Risk for activity intolerance will decrease Outcome: Progressing   Problem: Nutrition: Goal: Adequate nutrition will be maintained Outcome: Progressing   Problem: Coping: Goal: Level of anxiety will decrease Outcome: Progressing   Problem: Elimination: Goal: Will not experience complications related to bowel motility Outcome: Progressing Goal: Will not experience complications related to urinary retention Outcome: Progressing   Problem: Pain Managment: Goal: General experience of comfort will improve Outcome: Progressing   Problem: Safety: Goal: Ability to remain free from injury will improve Outcome: Progressing   Problem: Skin Integrity: Goal: Risk for impaired skin integrity will decrease Outcome: Progressing

## 2022-05-04 DIAGNOSIS — E1059 Type 1 diabetes mellitus with other circulatory complications: Secondary | ICD-10-CM | POA: Diagnosis not present

## 2022-05-04 DIAGNOSIS — F32A Depression, unspecified: Secondary | ICD-10-CM | POA: Diagnosis not present

## 2022-05-04 DIAGNOSIS — S72302K Unspecified fracture of shaft of left femur, subsequent encounter for closed fracture with nonunion: Secondary | ICD-10-CM | POA: Diagnosis not present

## 2022-05-04 LAB — GLUCOSE, CAPILLARY
Glucose-Capillary: 224 mg/dL — ABNORMAL HIGH (ref 70–99)
Glucose-Capillary: 135 mg/dL — ABNORMAL HIGH (ref 70–99)

## 2022-05-04 MED ORDER — METHOCARBAMOL 500 MG PO TABS: 500.0000 mg | ORAL_TABLET | Freq: Three times a day (TID) | ORAL | Status: AC | PRN

## 2022-05-04 MED ORDER — ADULT MULTIVITAMIN W/MINERALS CH: 1.0000 | ORAL_TABLET | Freq: Every day | ORAL | Status: AC

## 2022-05-04 MED ORDER — POLYETHYLENE GLYCOL 3350 17 G PO PACK
17.0000 g | PACK | Freq: Every day | ORAL | Status: DC
  Administered 2022-05-04: 17 g via ORAL
  Filled 2022-05-04: qty 1

## 2022-05-04 MED ORDER — METOPROLOL TARTRATE 25 MG PO TABS: 25.0000 mg | ORAL_TABLET | Freq: Two times a day (BID) | ORAL | Status: AC

## 2022-05-04 NOTE — Plan of Care (Signed)
Problem: Education: Goal: Verbalization of understanding the information provided (i.e., activity precautions, restrictions, etc) will improve Outcome: Adequate for Discharge Goal: Individualized Educational Video(s) Outcome: Adequate for Discharge   Problem: Activity: Goal: Ability to ambulate and perform ADLs will improve Outcome: Adequate for Discharge   Problem: Clinical Measurements: Goal: Postoperative complications will be avoided or minimized Outcome: Adequate for Discharge   Problem: Self-Concept: Goal: Ability to maintain and perform role responsibilities to the fullest extent possible will improve Outcome: Adequate for Discharge   Problem: Pain Management: Goal: Pain level will decrease Outcome: Adequate for Discharge   Problem: Education: Goal: Ability to describe self-care measures that may prevent or decrease complications (Diabetes Survival Skills Education) will improve Outcome: Adequate for Discharge Goal: Individualized Educational Video(s) Outcome: Adequate for Discharge   Problem: Coping: Goal: Ability to adjust to condition or change in health will improve Outcome: Adequate for Discharge   Problem: Fluid Volume: Goal: Ability to maintain a balanced intake and output will improve Outcome: Adequate for Discharge   Problem: Health Behavior/Discharge Planning: Goal: Ability to identify and utilize available resources and services will improve Outcome: Adequate for Discharge Goal: Ability to manage health-related needs will improve Outcome: Adequate for Discharge   Problem: Metabolic: Goal: Ability to maintain appropriate glucose levels will improve Outcome: Adequate for Discharge   Problem: Nutritional: Goal: Maintenance of adequate nutrition will improve Outcome: Adequate for Discharge Goal: Progress toward achieving an optimal weight will improve Outcome: Adequate for Discharge   Problem: Skin Integrity: Goal: Risk for impaired skin integrity  will decrease Outcome: Adequate for Discharge   Problem: Tissue Perfusion: Goal: Adequacy of tissue perfusion will improve Outcome: Adequate for Discharge   Problem: Education: Goal: Knowledge of General Education information will improve Description: Including pain rating scale, medication(s)/side effects and non-pharmacologic comfort measures Outcome: Adequate for Discharge   Problem: Health Behavior/Discharge Planning: Goal: Ability to manage health-related needs will improve Outcome: Adequate for Discharge   Problem: Clinical Measurements: Goal: Ability to maintain clinical measurements within normal limits will improve Outcome: Adequate for Discharge Goal: Will remain free from infection Outcome: Adequate for Discharge Goal: Diagnostic test results will improve Outcome: Adequate for Discharge Goal: Respiratory complications will improve Outcome: Adequate for Discharge Goal: Cardiovascular complication will be avoided Outcome: Adequate for Discharge   Problem: Activity: Goal: Risk for activity intolerance will decrease Outcome: Adequate for Discharge   Problem: Nutrition: Goal: Adequate nutrition will be maintained Outcome: Adequate for Discharge   Problem: Coping: Goal: Level of anxiety will decrease Outcome: Adequate for Discharge   Problem: Elimination: Goal: Will not experience complications related to bowel motility Outcome: Adequate for Discharge Goal: Will not experience complications related to urinary retention Outcome: Adequate for Discharge   Problem: Pain Managment: Goal: General experience of comfort will improve Outcome: Adequate for Discharge   Problem: Safety: Goal: Ability to remain free from injury will improve Outcome: Adequate for Discharge   Problem: Skin Integrity: Goal: Risk for impaired skin integrity will decrease Outcome: Adequate for Discharge   Problem: Education: Goal: Ability to describe self-care measures that may prevent  or decrease complications (Diabetes Survival Skills Education) will improve Outcome: Adequate for Discharge Goal: Individualized Educational Video(s) Outcome: Adequate for Discharge   Problem: Cardiac: Goal: Ability to maintain an adequate cardiac output will improve Outcome: Adequate for Discharge   Problem: Health Behavior/Discharge Planning: Goal: Ability to identify and utilize available resources and services will improve Outcome: Adequate for Discharge Goal: Ability to manage health-related needs will improve Outcome: Adequate for Discharge  Problem: Fluid Volume: Goal: Ability to achieve a balanced intake and output will improve Outcome: Adequate for Discharge   Problem: Metabolic: Goal: Ability to maintain appropriate glucose levels will improve Outcome: Adequate for Discharge   Problem: Nutritional: Goal: Maintenance of adequate nutrition will improve Outcome: Adequate for Discharge Goal: Maintenance of adequate weight for body size and type will improve Outcome: Adequate for Discharge   Problem: Respiratory: Goal: Will regain and/or maintain adequate ventilation Outcome: Adequate for Discharge   Problem: Urinary Elimination: Goal: Ability to achieve and maintain adequate renal perfusion and functioning will improve Outcome: Adequate for Discharge

## 2022-05-04 NOTE — Progress Notes (Signed)
Patient is alert and oriented x4. States that she feels much better. Denied additional needs.

## 2022-05-04 NOTE — Discharge Summary (Signed)
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   1/22: Hemodynamically stable, s/p IM nail removal by orthopedic surgery.  PT/OT recommending SNF. Concern of some intermittent confusion, daughter asking to check UA as she becomes confused with UTI- Hemoglobin decreased to 6.8-ordered 1 unit of PRBC   1/23: Patient was feeling little more lethargic.  Vital stable.  Labs with improving hemoglobin to 9.9 but did show anion gap metabolic acidosis consistent with DKA with elevated blood glucose level.  Endo tool per DKA protocol ordered but unable to transfer patient to stepdown due to no bed availability.  Anion gap resolved with insulin and IV fluid. Transfer was canceled and patient was restarted on basal and short-acting.  Also giving some extra fluid. Daughter was concerned about restarting insulin pump, stating that is the only way to keep her blood glucose level well-controlled.  Message sent to diabetes coordinator and pharmacy to help. Also starting her on low-dose metoprolol for tachycardia.   1/24: Hemodynamically stable.  CBG improved, acidosis resolved and beta-hydroxybutyrate  acid improving.  Awaiting SNF placement.   1/25: Patient remained stable.  Awaiting SNF placement.   1/26: Hemodynamically stable.  Awaiting SNF placement.  Had 1 bed offer for Monday.  Will require a COVID testing on Sunday.   1/27: Patient was resting comfortably when seen during morning rounds.  Denies any pain. Developed some hypoxia later on while working with PT requiring a bolus.   1/28: Clinically improving.  CBG elevated this morning, changing Semglee to 40 units twice daily.  1/29: Patient remained stable.  Respiratory panel negative for COVID and flu.  Patient will continue on current medications including her insulin pump.  She has very labile diabetes with quite variable fluctuations in blood glucose level with small changes in insulin.  She need to have a close follow-up with her primary care provider and endocrinologist for further management of her diabetes.  She is being discharged on some pain medications to be used only as needed.  Please consider giving her regular bowel regimen to avoid constipation if she continues to need pain medications.  Patient will follow-up with orthopedic surgery as an outpatient for further management of her recent fracture and surgery.  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 Patient is status post recent ORIF for a left femoral neck fracture on 03/29/22 Immobilize left lower extremity Pain control Muscle relaxants Consult orthopedic surgery  Coronary artery  disease Patient has a history of coronary artery disease Aspirin is on hold for planned surgery Continue Crestor  Depression Continue  sertraline and bupropion  Type 1 diabetes mellitus ([place]) Patient has a history of type 1 diabetes mellitus and has an insulin pump which has been switched off Place patient on consistent carbohydrate diet Glycemic control with sliding scale insulin Check blood sugars AC meals and every 4 while NPO        Pain control - [place] Controlled Substance Reporting System database was reviewed. and patient was instructed, not to drive, operate heavy machinery, perform activities at heights, swimming or participation in water activities or provide baby-sitting services while on Pain, Sleep and Anxiety Medications; until their outpatient Physician has advised to do so again. Also recommended to not to take more than prescribed Pain, Sleep and Anxiety Medications.  Consultants: Orthopedic surgery Procedures performed: ORIF Disposition: Skilled nursing facility Diet recommendation:  Discharge Diet Orders (From admission, onward)     Start     Ordered   05/04/22 0000  Diet - low sodium heart healthy        05/04/22 1050           Cardiac and Carb modified diet DISCHARGE MEDICATION: Allergies as of 05/04/2022       Reactions   Bupropion    Other reaction(s): Other (See Comments) Constipation Tolerating low dose   Percocet [oxycodone-acetaminophen] Nausea And Vomiting   Statins    Joint pain   Sulfa Antibiotics Nausea And Vomiting        Medication List     STOP taking these medications    aspirin 81 MG tablet Replaced by: aspirin EC 81 MG tablet       TAKE these medications    acetaminophen 500 MG tablet Commonly known as: TYLENOL Take 2 tablets (1,000 mg total) by mouth 3 (three) times daily.   alendronate 70 MG tablet Commonly known as: FOSAMAX Take 70 mg by mouth once a week.   aspirin EC 81 MG tablet Take 1 tablet (81 mg total) by mouth 2 (two) times daily. Swallow whole. Replaces: aspirin 81 MG tablet   buPROPion 150 MG 24 hr tablet Commonly known  as: WELLBUTRIN XL Take 1 tablet (150 mg total) by mouth every morning.   docusate sodium 100 MG capsule Commonly known as: COLACE Take 1 capsule (100 mg total) by mouth 2 (two) times daily. What changed:  how much to take when to take this   ferrous sulfate 325 (65 FE) MG tablet Take 1 tablet (325 mg total) by mouth daily with breakfast.   GLUCAGON EMERGENCY IJ Inject as directed as needed (for low blood sugar).   HYDROcodone-acetaminophen 5-325 MG tablet Commonly known as: NORCO/VICODIN Take 1 tablet by mouth every 4 (four) hours as needed for moderate pain.   insulin pump Soln INSULIN PUMP THREE TIMES DAILY.   melatonin 3 MG Tabs tablet Take 1 tablet (3 mg total) by mouth at bedtime as needed.   menthol-cetylpyridinium 3 MG lozenge Commonly known as: CEPACOL Take 1 lozenge (3 mg total) by mouth as needed for sore throat.   methocarbamol 500 MG tablet Commonly known as: ROBAXIN Take 1 tablet (500 mg total) by mouth every 8 (eight) hours as needed for muscle spasms.   metoprolol tartrate 25 MG tablet Commonly known as: LOPRESSOR Take 1 tablet (25 mg total) by mouth 2 (two) times daily.   midodrine 5 MG tablet Commonly known as: PROAMATINE Take 1 tablet (5  mg total) by mouth 3 (three) times daily.   multivitamin with minerals Tabs tablet Take 1 tablet by mouth daily.   rosuvastatin 10 MG tablet Commonly known as: CRESTOR Take 1 tablet (10 mg total) by mouth at bedtime.   senna-docusate 8.6-50 MG tablet Commonly known as: Senokot-S Take 2 tablets by mouth 2 (two) times daily.   sertraline 100 MG tablet Commonly known as: ZOLOFT Take 1 tablet (100 mg total) by mouth daily.   Vitamin D (Ergocalciferol) 1.25 MG (50000 UNIT) Caps capsule Commonly known as: DRISDOL Take 1 capsule (50,000 Units total) by mouth every 7 (seven) days.   zinc gluconate 50 MG tablet Take 1 tablet (50 mg total) by mouth daily.        Follow-up Information     Latanya Maudlin,  NP. Schedule an appointment as soon as possible for a visit in 1 week(s).   Specialty: Family Medicine Contact information: [place] [address] [phone]         Earnestine Leys, MD. Schedule an appointment as soon as possible for a visit in 1 week(s).   Specialty: Orthopedic Surgery Contact information: [place] [address] [phone]                Discharge Exam: Danley Danker Weights   04/24/22 1250  Weight: 49.9 kg   General.     In no acute distress. Pulmonary.  Lungs clear bilaterally, normal respiratory effort. CV.  Regular rate and rhythm, no JVD, rub or murmur. Abdomen.  Soft, nontender, nondistended, BS positive. CNS.  Alert and oriented .  No focal neurologic deficit. Extremities.  No edema, no cyanosis, pulses intact and symmetrical. Psychiatry.  Judgment and insight appears normal.   Condition at discharge: stable  The results of significant diagnostics from this hospitalization (including imaging, microbiology, ancillary and laboratory) are listed below for reference.   Imaging Studies: DG HIP UNILAT WITH PELVIS 2-3 VIEWS LEFT  Result Date: 04/25/2022 CLINICAL DATA:  54492 Fracture 96051 EXAM: DG HIP (WITH OR WITHOUT PELVIS) 2-3V LEFT COMPARISON:  04/24/2022 FINDINGS: Intraoperative imaging demonstrates internal fixation across the proximal femoral shaft fracture. Anatomic alignment. No visible hardware bony complicating feature. IMPRESSION: Internal fixation.  No visible complicating feature. Electronically Signed   By: Rolm Baptise M.D.   On: 04/25/2022 10:09   DG C-Arm 1-60 Min-No Report  Result Date: 04/25/2022 Fluoroscopy was utilized by the requesting physician.  No radiographic interpretation.   CT Head Wo Contrast  Result Date: 04/24/2022 CLINICAL DATA:  Patient with head trauma EXAM: CT HEAD WITHOUT CONTRAST TECHNIQUE: Contiguous axial images were obtained from the base of the skull through the  vertex without intravenous contrast. RADIATION DOSE REDUCTION: This exam was performed according to the departmental dose-optimization program which includes automated exposure control, adjustment of the mA and/or kV according to patient size and/or use of iterative reconstruction technique. COMPARISON:  Brain CT 10/10/2021 FINDINGS: Brain: Atrophy. Periventricular and subcortical white matter hypodensities compatible with chronic microvascular ischemic changes. No evidence for acute cortically based infarct, intracranial hemorrhage, mass lesion or mass-effect. No evidence for acute cortically based infarct, intracranial hemorrhage, mass lesion or mass-effect. Vascular: No hyperdense vessel or unexpected calcification. Skull: Intact. Sinuses/Orbits: Mucosal thickening involving the maxillary sinuses. Small amount of fluid within the left mastoid air cells. Small amount of fluid within the sphenoid sinus. Orbits are unremarkable. Other: Soft tissue swelling overlying the right calvarium. IMPRESSION: 1. No acute intracranial process. 2. Atrophy and chronic microvascular  ischemic changes. Electronically Signed   By: Lovey Newcomer M.D.   On: 04/24/2022 13:51   DG Femur Min 2 Views Left  Result Date: 04/24/2022 CLINICAL DATA:  Status post fall.  Recent left hip pinning. EXAM: LEFT FEMUR 2 VIEWS COMPARISON:  04/16/2022 FINDINGS: Postoperative changes from recent ORIF of recent subcapital left femoral neck fracture. Three cannulated screws are noted reducing the hip fracture. There is an acute fracture deformity involving the proximal left femur just distal to the lesser trochanter. There is may scratch set there is medial angulation of the distal fracture fragments. The mid and distal femur appear intact. IMPRESSION: 1. Acute fracture deformity involves the proximal left femur just distal to the lesser trochanter. 2. Status post ORIF of recent subcapital left femoral neck fracture. Electronically Signed   By: Kerby Moors M.D.   On: 04/24/2022 13:45   DG Pelvis 1-2 Views  Result Date: 04/24/2022 CLINICAL DATA:  Pain after fall EXAM: PELVIS - 1VIEW COMPARISON:  Fluoroscopy 03/28/2022 and older FINDINGS: Osteopenia. Bilateral oblique femoral neck screws are in place. The femoral neck fracture on the left has patent lucency. Stable alignment. However there is a new oblique angulated fracture noted along the proximal femoral shaft just distal to the trochanteric region at the level of the base of the screws on the left. Severe underlying osteopenia pre vascular calcifications. Mild multilevel joint space loss particularly of the hip joints. IMPRESSION: Interval development of a new fracture involving the proximal left femoral shaft just distal to the trochanteric region at the base of the femoral neck pins. Electronically Signed   By: Jill Side M.D.   On: 04/24/2022 13:42   DG Chest 1 View  Result Date: 04/24/2022 CLINICAL DATA:  Recent fall EXAM: CHEST  1 VIEW COMPARISON:  Chest x-ray March 28, 2022 FINDINGS: The cardiomediastinal silhouette is unchanged in contour. No focal pulmonary opacity. No pleural effusion or pneumothorax. The visualized upper abdomen is unremarkable. No acute osseous abnormality. IMPRESSION: No acute cardiopulmonary abnormality. Electronically Signed   By: Beryle Flock M.D.   On: 04/24/2022 13:39   DG HIP UNILAT WITH PELVIS 2-3 VIEWS LEFT  Result Date: 04/16/2022 CLINICAL DATA:  Pain EXAM: DG HIP (WITH OR WITHOUT PELVIS) 3V LEFT COMPARISON:  None Available. FINDINGS: Osseous structures are osteopenic. Pelvic ring is intact. Lumbosacral degenerative changes identified. Patient is status post bilateral femoral neck ORIF with compression screws. There is grossly anatomic alignment. IMPRESSION: Bilateral femoral neck fractures status post ORIF. Osteopenia. Degenerative changes. Electronically Signed   By: Sammie Bench M.D.   On: 04/16/2022 15:31   VAS [name] LOWER EXTREMITY VENOUS  (DVT)  Result Date: 04/08/2022  Lower Venous DVT Study Patient Name:  Diana Frost  Date of Exam:   04/07/2022 Medical Rec #: [id number]          Accession #:    [id number] Date of Birth: May 04, 1945         Patient Gender: F Patient Age:   5 years Exam Location:  [facility] Procedure:      VAS [name] LOWER EXTREMITY VENOUS (DVT) Referring Phys: Leeroy Cha --------------------------------------------------------------------------------  Indications: Pain and swelling left leg, s/p percutaneous fixation of femoral neck.  Comparison Study: No prior studies. Performing Technologist: Darlin Coco RDMS, RVT  Examination Guidelines: A complete evaluation includes B-mode imaging, spectral Doppler, color Doppler, and power Doppler as needed of all accessible portions of each vessel. Bilateral testing is considered an integral part of a complete examination. Limited  examinations for reoccurring indications may be performed as noted. The reflux portion of the exam is performed with the patient in reverse Trendelenburg.  +-----+---------------+---------+-----------+----------+--------------+ RIGHTCompressibilityPhasicitySpontaneityPropertiesThrombus Aging +-----+---------------+---------+-----------+----------+--------------+ CFV  Full           Yes      Yes                                 +-----+---------------+---------+-----------+----------+--------------+   +---------+---------------+---------+-----------+----------+--------------+ LEFT     CompressibilityPhasicitySpontaneityPropertiesThrombus Aging +---------+---------------+---------+-----------+----------+--------------+ CFV      Full           Yes      Yes                                 +---------+---------------+---------+-----------+----------+--------------+ SFJ      Full                                                        +---------+---------------+---------+-----------+----------+--------------+ FV Prox  Full                                                         +---------+---------------+---------+-----------+----------+--------------+ FV Mid   Full                                                        +---------+---------------+---------+-----------+----------+--------------+ FV DistalFull                                                        +---------+---------------+---------+-----------+----------+--------------+ PFV      Full                                                        +---------+---------------+---------+-----------+----------+--------------+ POP      Full           Yes      Yes                                 +---------+---------------+---------+-----------+----------+--------------+ PTV      Full                                                        +---------+---------------+---------+-----------+----------+--------------+ PERO     Full                                                        +---------+---------------+---------+-----------+----------+--------------+  Summary: RIGHT: - No evidence of common femoral vein obstruction.  LEFT: - There is no evidence of deep vein thrombosis in the lower extremity.  - No cystic structure found in the popliteal fossa.  *See table(s) above for measurements and observations. Electronically signed by Deitra Mayo MD on 04/08/2022 at 6:41:20 AM.    Final     Microbiology: Results for orders placed or performed during the hospital encounter of 04/24/22  Resp panel by RT-PCR (RSV, Flu A&B, Covid) Anterior Nasal Swab     Status: None   Collection Time: 05/03/22  5:12 PM   Specimen: Anterior Nasal Swab  Result Value Ref Range Status   SARS Coronavirus 2 by RT PCR NEGATIVE NEGATIVE Final    Comment: (NOTE) SARS-CoV-2 target nucleic acids are NOT DETECTED.  The SARS-CoV-2 RNA is generally detectable in upper respiratory specimens during the acute phase of infection. The lowest concentration of  SARS-CoV-2 viral copies this assay can detect is 138 copies/mL. A negative result does not preclude SARS-Cov-2 infection and should not be used as the sole basis for treatment or other patient management decisions. A negative result may occur with  improper specimen collection/handling, submission of specimen other than nasopharyngeal swab, presence of viral mutation(s) within the areas targeted by this assay, and inadequate number of viral copies(<138 copies/mL). A negative result must be combined with clinical observations, patient history, and epidemiological information. The expected result is Negative.  Fact Sheet for Patients:  EntrepreneurPulse.com.au  Fact Sheet for Healthcare Providers:  IncredibleEmployment.be  This test is no t yet approved or cleared by the [place] FDA and  has been authorized for detection and/or diagnosis of SARS-CoV-2 by FDA under an Emergency Use Authorization (EUA). This EUA will remain  in effect (meaning this test can be used) for the duration of the COVID-19 declaration under Section 564(b)(1) of the Act, 21 U.S.C.section 360bbb-3(b)(1), unless the authorization is terminated  or revoked sooner.       Influenza A by PCR NEGATIVE NEGATIVE Final   Influenza B by PCR NEGATIVE NEGATIVE Final    Comment: (NOTE) The Xpert Xpress SARS-CoV-2/FLU/RSV plus assay is intended as an aid in the diagnosis of influenza from Nasopharyngeal swab specimens and should not be used as a sole basis for treatment. Nasal washings and aspirates are unacceptable for Xpert Xpress SARS-CoV-2/FLU/RSV testing.  Fact Sheet for Patients: EntrepreneurPulse.com.au  Fact Sheet for Healthcare Providers: IncredibleEmployment.be  This test is not yet approved or cleared by the [place] FDA and has been authorized for detection and/or diagnosis of SARS-CoV-2 by FDA under an Emergency Use  Authorization (EUA). This EUA will remain in effect (meaning this test can be used) for the duration of the COVID-19 declaration under Section 564(b)(1) of the Act, 21 U.S.C. section 360bbb-3(b)(1), unless the authorization is terminated or revoked.     Resp Syncytial Virus by PCR NEGATIVE NEGATIVE Final    Comment: (NOTE) Fact Sheet for Patients: EntrepreneurPulse.com.au  Fact Sheet for Healthcare Providers: IncredibleEmployment.be  This test is not yet approved or cleared by the [place] FDA and has been authorized for detection and/or diagnosis of SARS-CoV-2 by FDA under an Emergency Use Authorization (EUA). This EUA will remain in effect (meaning this test can be used) for the duration of the COVID-19 declaration under Section 564(b)(1) of the Act, 21 U.S.C. section 360bbb-3(b)(1), unless the authorization is terminated or revoked.  Performed at [facility], [address] [place], [place] [address]     Labs: CBC:  Recent Labs  Lab 04/27/22 2132 04/28/22 0936 04/30/22 1056  WBC  --  9.9 5.9  HGB 8.6* 9.9* 10.1*  HCT 26.6* 30.5* 30.8*  MCV  --  94.4 93.3  PLT  --  226 828   Basic Metabolic Panel: Recent Labs  Lab 04/28/22 1612 04/28/22 2009 04/29/22 0026 04/29/22 0452 04/30/22 1056 05/02/22 0551  NA 137 135 137 138 135  --   K 3.8 4.1 3.4* 3.5 3.3*  --   CL 106 107 108 106 103  --   CO2 22 21* '25 24 26  '$ --   GLUCOSE 76 229* 118* 100* 199*  --   BUN '16 14 11 9 15  '$ --   CREATININE 0.65 0.72 0.69 0.57 0.74 0.66  CALCIUM 8.0* 7.7* 7.8* 7.8* 7.8*  --    Liver Function Tests: No results for input(s): "AST", "ALT", "ALKPHOS", "BILITOT", "PROT", "ALBUMIN" in the last 168 hours. CBG: Recent Labs  Lab 05/03/22 1136 05/03/22 1156 05/03/22 1706 05/03/22 2049 05/04/22 0742  GLUCAP 108* 112* 149* 132* 224*    Discharge time spent: greater than 30 minutes.  This record has been created using Therapist, sports. Errors have been sought and corrected,but may not always be located. Such creation errors do not reflect on the standard of care.   Signed: Lorella Nimrod, MD [facility] 05/04/2022

## 2022-05-04 NOTE — TOC Transition Note (Signed)
Transition of Care [facility]) - CM/SW Discharge Note   Patient Details  Name: Diana Frost MRN: [id number] Date of Birth: 18-Feb-1946  Transition of Care [facility]) CM/SW Contact:  Tiburcio Bash, LCSW Phone Number: 05/04/2022, 11:19 AM   Clinical Narrative:     Patient will DC to: [facility] Anticipated DC date: 05/04/22 Family notified: daughter [name] Transport by: daughter  Per MD patient ready for DC to [facility] . RN, patient, patient's family, and facility notified of DC. Discharge Summary sent to facility, faxed to [phone] and emailed to [email or web address]. RN given number for report at [phone] ask for the supervisor. DC packet on chart.  CSW signing off.  [place], [place]   Final next level of care: Skilled Nursing Facility Barriers to Discharge: No Barriers Identified   Patient Goals and CMS Choice CMS Medicare.gov Compare Post Acute Care list provided to:: Patient Choice offered to / list presented to : Patient  Discharge Placement                      Patient and family notified of of transfer: 05/04/22  Discharge Plan and Services Additional resources added to the After Visit Summary for     Discharge Planning Services: CM Consult            DME Arranged: N/A                    Social Determinants of Health (SDOH) Interventions SDOH Screenings   Food Insecurity: No Food Insecurity (04/24/2022)  Housing: Low Risk  (04/24/2022)  Transportation Needs: No Transportation Needs (04/24/2022)  Utilities: Not At Risk (04/24/2022)  Tobacco Use: Medium Risk (04/30/2022)     Readmission Risk Interventions    04/28/2022   10:10 AM  Readmission Risk Prevention Plan  Transportation Screening Complete  Medication Review (RN Care Manager) Referral to Pharmacy  PCP or Specialist appointment within 3-5 days of discharge Complete  HRI or Home Care Consult Complete  SW Recovery Care/Counseling Consult  Complete  Palliative Care Screening Not [place] Complete

## 2022-05-04 NOTE — Care Management Important Message (Signed)
Important Message  Patient Details  Name: Diana Frost MRN: [id number] Date of Birth: 21-Jul-1945   Medicare Important Message Given:  Yes     Diana Frost 05/04/2022, 12:28 PM

## 2022-05-04 NOTE — Progress Notes (Signed)
Patient's bed saturated with urine this morning. Complete bed change and bath given. Asked patient to transfer to chair but she refused stating that she could not ambulate. Replaced purewick after bath and linen change x2 w/tech.

## 2022-05-19 ENCOUNTER — Inpatient Hospital Stay: Payer: Medicare Other | Admitting: Physical Medicine and Rehabilitation

## 2022-07-28 ENCOUNTER — Other Ambulatory Visit: Payer: Self-pay | Admitting: Physical Medicine and Rehabilitation

## 2022-09-23 ENCOUNTER — Other Ambulatory Visit: Payer: Self-pay | Admitting: Physical Medicine and Rehabilitation

## 2023-11-18 ENCOUNTER — Other Ambulatory Visit: Payer: Self-pay | Admitting: Gerontology

## 2023-11-18 DIAGNOSIS — R911 Solitary pulmonary nodule: Secondary | ICD-10-CM

## 2023-12-27 ENCOUNTER — Other Ambulatory Visit

## 2023-12-28 ENCOUNTER — Ambulatory Visit
Admission: RE | Admit: 2023-12-28 | Discharge: 2023-12-28 | Disposition: A | Discharge status: Home / Self Care | Source: Ambulatory Visit | Attending: Gerontology | Admitting: Gerontology

## 2023-12-28 DIAGNOSIS — R911 Solitary pulmonary nodule: Secondary | ICD-10-CM | POA: Insufficient documentation
